# Patient Record
Sex: Male | Born: 1945 | Race: Black or African American | Hispanic: No | Smoking: Current some day smoker
Health system: Southern US, Community
[De-identification: ages and names within clinical notes are randomized; demographics above are authoritative.]

## PROBLEM LIST (undated history)

## (undated) DIAGNOSIS — M1990 Unspecified osteoarthritis, unspecified site: Secondary | ICD-10-CM

## (undated) DIAGNOSIS — K4090 Unilateral inguinal hernia, without obstruction or gangrene, not specified as recurrent: Secondary | ICD-10-CM

## (undated) DIAGNOSIS — N529 Male erectile dysfunction, unspecified: Secondary | ICD-10-CM

## (undated) DIAGNOSIS — I4729 Other ventricular tachycardia: Secondary | ICD-10-CM

## (undated) DIAGNOSIS — L988 Other specified disorders of the skin and subcutaneous tissue: Secondary | ICD-10-CM

## (undated) DIAGNOSIS — K631 Perforation of intestine (nontraumatic): Secondary | ICD-10-CM

## (undated) DIAGNOSIS — I10 Essential (primary) hypertension: Secondary | ICD-10-CM

## (undated) DIAGNOSIS — J449 Chronic obstructive pulmonary disease, unspecified: Secondary | ICD-10-CM

## (undated) DIAGNOSIS — K659 Peritonitis, unspecified: Secondary | ICD-10-CM

## (undated) DIAGNOSIS — K5792 Diverticulitis of intestine, part unspecified, without perforation or abscess without bleeding: Secondary | ICD-10-CM

## (undated) DIAGNOSIS — I472 Ventricular tachycardia: Secondary | ICD-10-CM

## (undated) DIAGNOSIS — N183 Chronic kidney disease, stage 3 unspecified: Secondary | ICD-10-CM

## (undated) DIAGNOSIS — I420 Dilated cardiomyopathy: Secondary | ICD-10-CM

## (undated) DIAGNOSIS — N5089 Other specified disorders of the male genital organs: Secondary | ICD-10-CM

## (undated) DIAGNOSIS — M109 Gout, unspecified: Secondary | ICD-10-CM

## (undated) DIAGNOSIS — K56609 Unspecified intestinal obstruction, unspecified as to partial versus complete obstruction: Secondary | ICD-10-CM

## (undated) DIAGNOSIS — I5022 Chronic systolic (congestive) heart failure: Secondary | ICD-10-CM

## (undated) HISTORY — DX: Chronic systolic (congestive) heart failure: I50.22

## (undated) HISTORY — DX: Unilateral inguinal hernia, without obstruction or gangrene, not specified as recurrent: K40.90

## (undated) HISTORY — DX: Other ventricular tachycardia: I47.29

## (undated) HISTORY — DX: Chronic kidney disease, stage 3 (moderate): N18.3

## (undated) HISTORY — DX: Male erectile dysfunction, unspecified: N52.9

## (undated) HISTORY — DX: Perforation of intestine (nontraumatic): K63.1

## (undated) HISTORY — DX: Dilated cardiomyopathy: I42.0

## (undated) HISTORY — DX: Chronic obstructive pulmonary disease, unspecified: J44.9

## (undated) HISTORY — DX: Diverticulitis of intestine, part unspecified, without perforation or abscess without bleeding: K57.92

## (undated) HISTORY — DX: Gout, unspecified: M10.9

## (undated) HISTORY — DX: Peritonitis, unspecified: K65.9

## (undated) HISTORY — DX: Chronic kidney disease, stage 3 unspecified: N18.30

## (undated) HISTORY — DX: Unspecified osteoarthritis, unspecified site: M19.90

## (undated) HISTORY — PX: COLON SURGERY: SHX602

## (undated) HISTORY — PX: COLOSTOMY: SHX63

## (undated) HISTORY — DX: Unspecified intestinal obstruction, unspecified as to partial versus complete obstruction: K56.609

## (undated) HISTORY — DX: Other specified disorders of the skin and subcutaneous tissue: L98.8

## (undated) HISTORY — DX: Ventricular tachycardia: I47.2

---

## 2003-01-29 ENCOUNTER — Ambulatory Visit (HOSPITAL_COMMUNITY): Admission: RE | Admit: 2003-01-29 | Discharge: 2003-01-29 | Payer: Self-pay | Admitting: *Deleted

## 2004-10-20 ENCOUNTER — Ambulatory Visit: Payer: Self-pay | Admitting: Physical Medicine & Rehabilitation

## 2004-10-20 ENCOUNTER — Ambulatory Visit: Payer: Self-pay | Admitting: Infectious Diseases

## 2004-10-20 ENCOUNTER — Inpatient Hospital Stay (HOSPITAL_COMMUNITY): Admission: EM | Admit: 2004-10-20 | Discharge: 2005-04-08 | Payer: Self-pay | Admitting: Emergency Medicine

## 2004-10-21 ENCOUNTER — Encounter (INDEPENDENT_AMBULATORY_CARE_PROVIDER_SITE_OTHER): Payer: Self-pay | Admitting: Cardiology

## 2004-10-21 ENCOUNTER — Encounter (INDEPENDENT_AMBULATORY_CARE_PROVIDER_SITE_OTHER): Payer: Self-pay | Admitting: Specialist

## 2004-10-30 ENCOUNTER — Ambulatory Visit: Payer: Self-pay | Admitting: Internal Medicine

## 2004-12-04 ENCOUNTER — Ambulatory Visit: Payer: Self-pay | Admitting: Internal Medicine

## 2004-12-27 ENCOUNTER — Ambulatory Visit: Payer: Self-pay | Admitting: Internal Medicine

## 2005-01-11 ENCOUNTER — Ambulatory Visit: Payer: Self-pay | Admitting: Internal Medicine

## 2005-01-29 ENCOUNTER — Ambulatory Visit: Payer: Self-pay | Admitting: Pulmonary Disease

## 2005-02-03 ENCOUNTER — Encounter (INDEPENDENT_AMBULATORY_CARE_PROVIDER_SITE_OTHER): Payer: Self-pay | Admitting: Specialist

## 2005-02-17 ENCOUNTER — Encounter: Payer: Self-pay | Admitting: Cardiology

## 2005-02-17 ENCOUNTER — Ambulatory Visit: Payer: Self-pay | Admitting: Cardiology

## 2005-02-21 ENCOUNTER — Encounter: Payer: Self-pay | Admitting: Cardiology

## 2005-03-08 ENCOUNTER — Ambulatory Visit: Payer: Self-pay | Admitting: Emergency Medicine

## 2005-03-09 ENCOUNTER — Encounter (INDEPENDENT_AMBULATORY_CARE_PROVIDER_SITE_OTHER): Payer: Self-pay | Admitting: Specialist

## 2005-03-29 ENCOUNTER — Ambulatory Visit: Payer: Self-pay | Admitting: Internal Medicine

## 2005-07-11 ENCOUNTER — Ambulatory Visit (HOSPITAL_COMMUNITY): Admission: RE | Admit: 2005-07-11 | Discharge: 2005-07-11 | Payer: Self-pay | Admitting: General Surgery

## 2006-07-14 IMAGING — CR DG CHEST 1V PORT
1 series · 1 of 1 positions shown · non-contrast
Comparison: none

CLINICAL DATA: Acute renal failure. CHF.

Portable chest at 8552:
Comparison to earlier film of same day. Moderate enlargement of the cardiac
silhouette. Low lung volumes. Possible mild pulmonary vascular congestion.
Patchy infiltrate or atelectasis   in the right lower lung and left infrahilar
regions. No definite effusion.

[view not recorded]
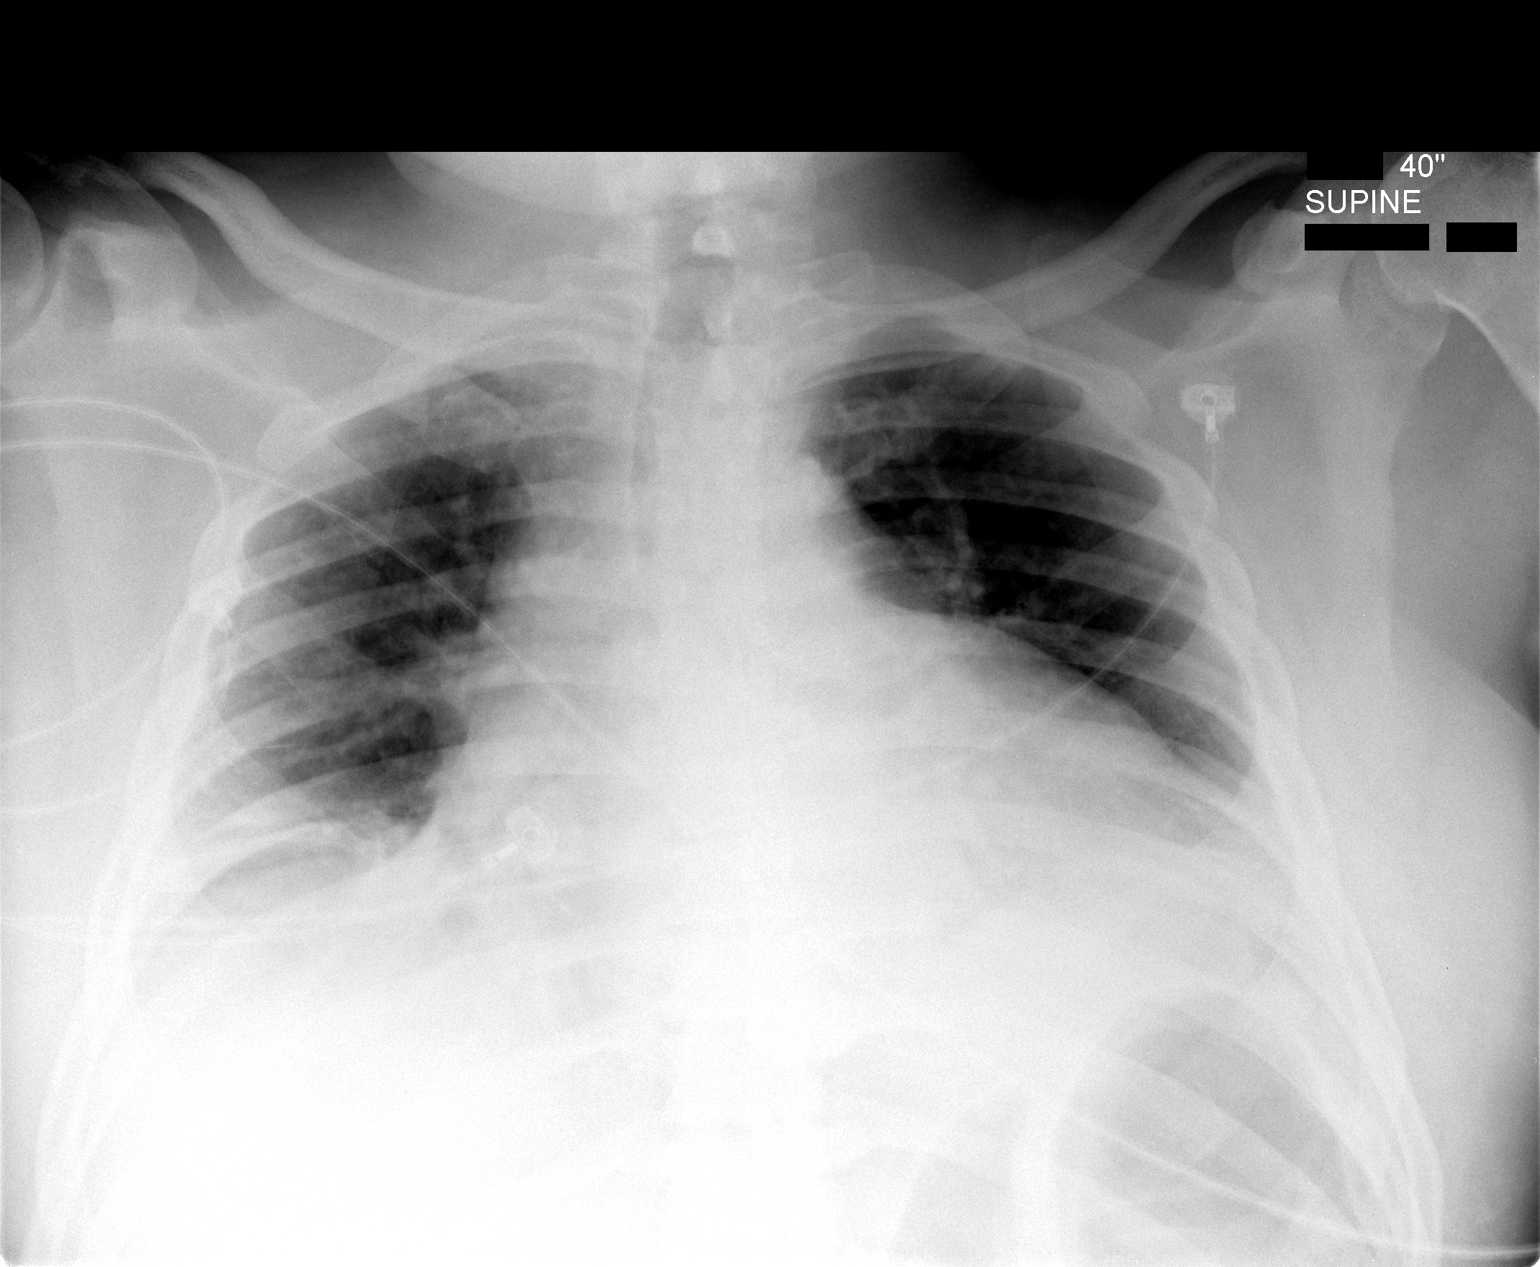

[1 of 1 positions shown; findings below may reference images not displayed]

IMPRESSION: 1. Moderate cardiomegaly.
2. Patchy right lower lung and left infrahilar infiltrates or atelectasis, with
low lung volumes

## 2006-07-14 IMAGING — US US RENAL PORT
1 series · 14 of 25 positions shown · non-contrast
Comparison: none

CLINICAL DATA: Acute renal failure. Hypertension.

Renal ultrasound:
Right kidney measures at least 10.5 cm in length, left 12 cm. A 14 x 17 mm cyst
is noted from the upper pole left kidney. No solid renal mass or hydronephrosis.
Urinary bladder decompressed by Foley catheter.

[Series 1: unknown · 0.35mm/px · 14 of 31 slices shown]
[im 1/31]
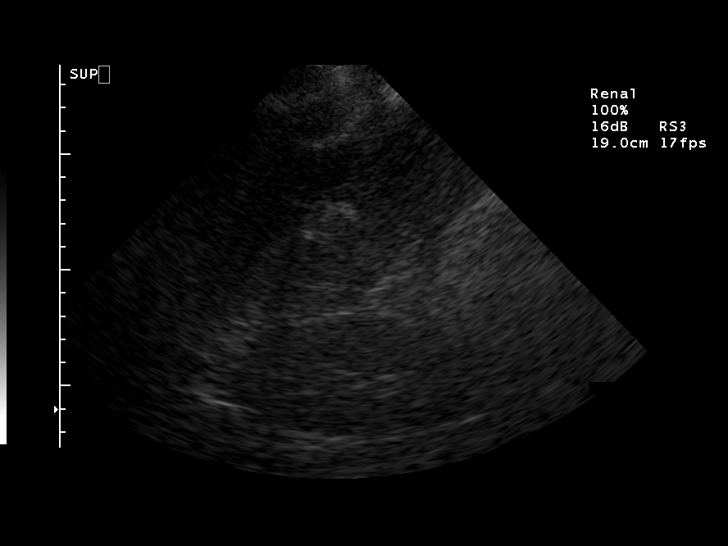
[im 3/31]
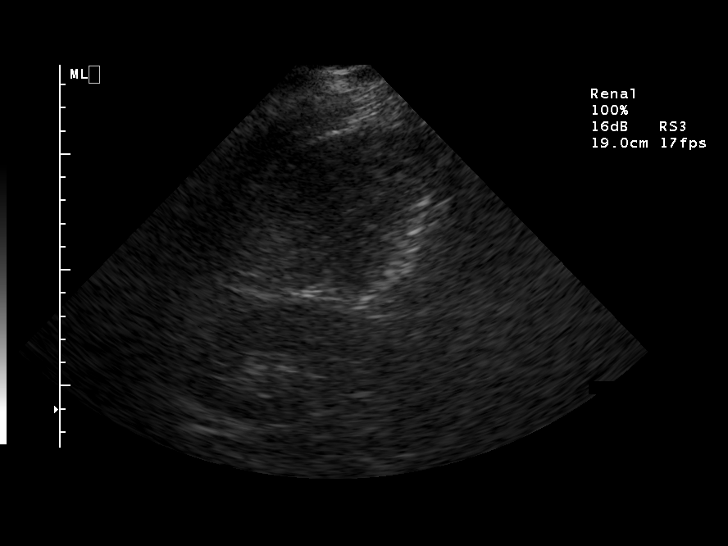
[im 6/31]
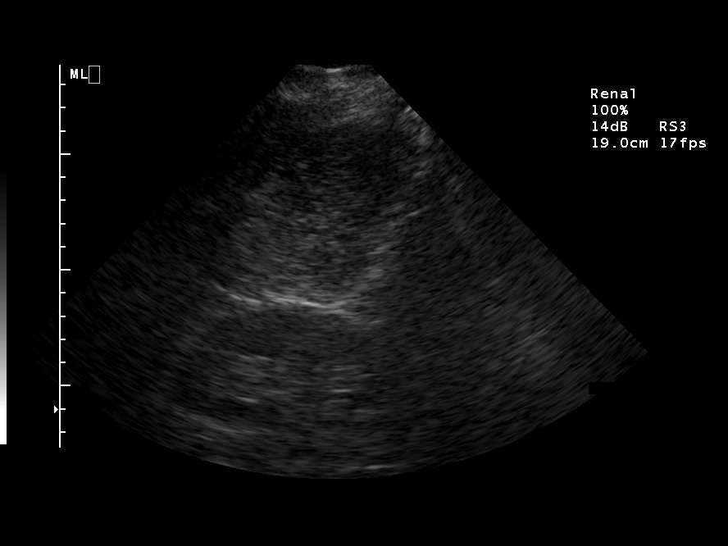
[im 8/31]
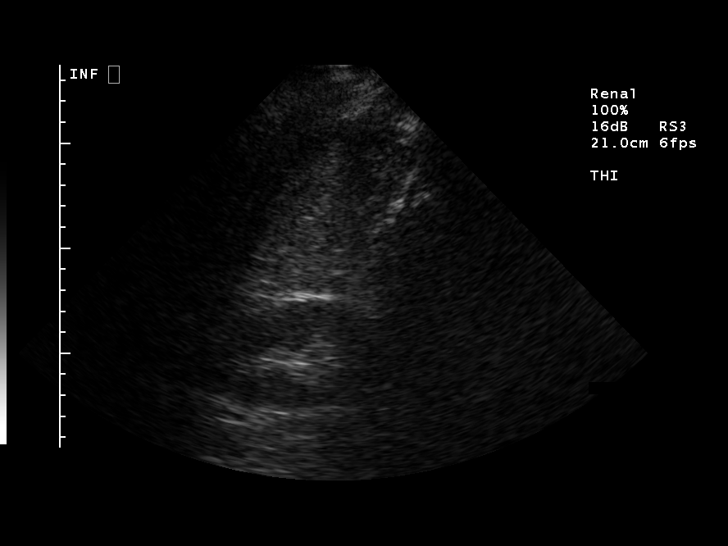
[im 11/31]
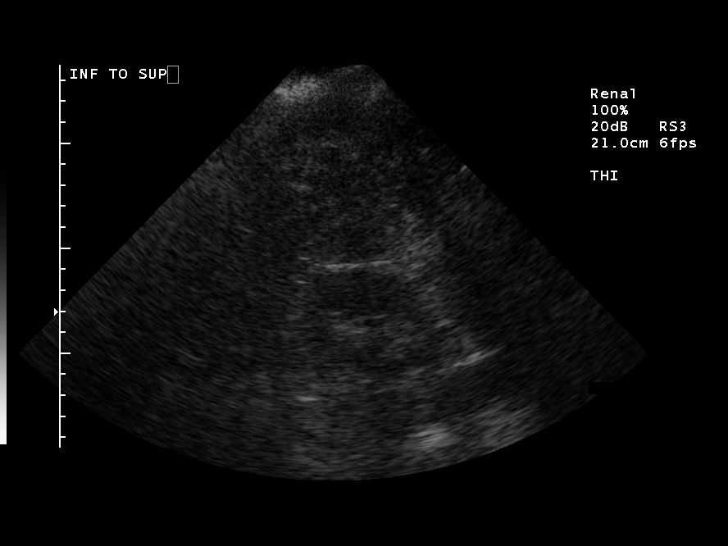
[im 12/31]
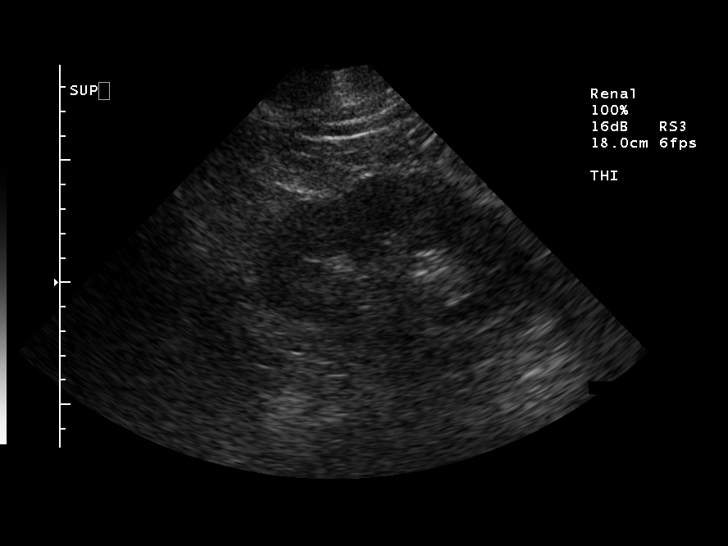
[im 14/31]
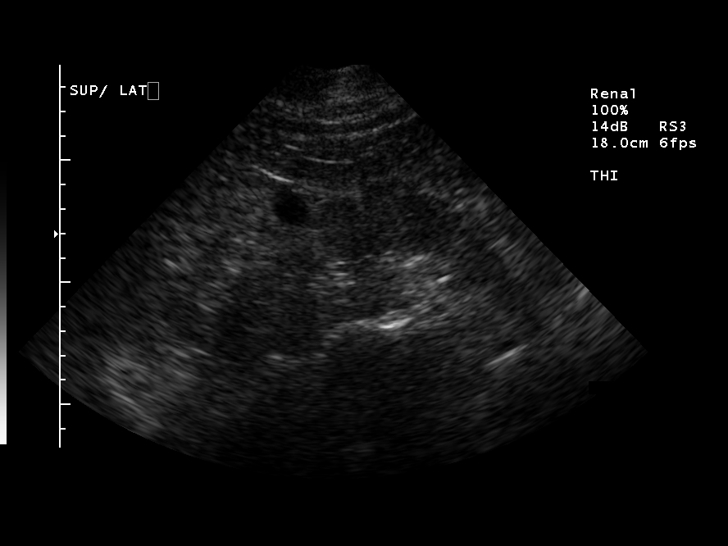
[im 17/31]
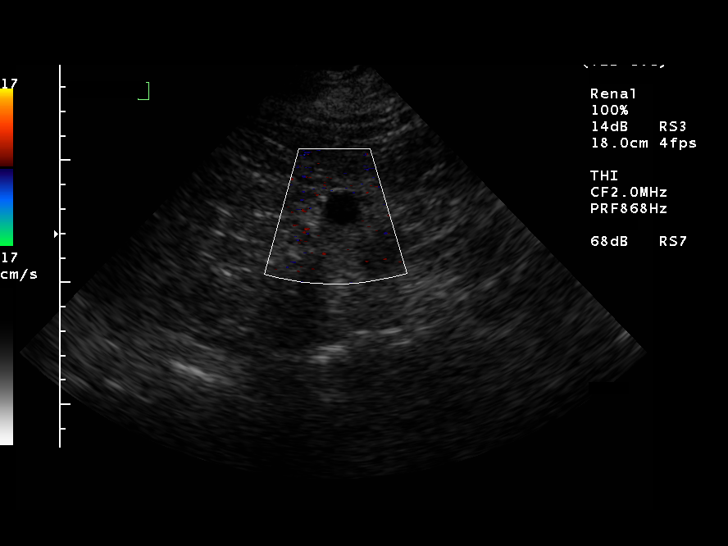
[im 19/31]
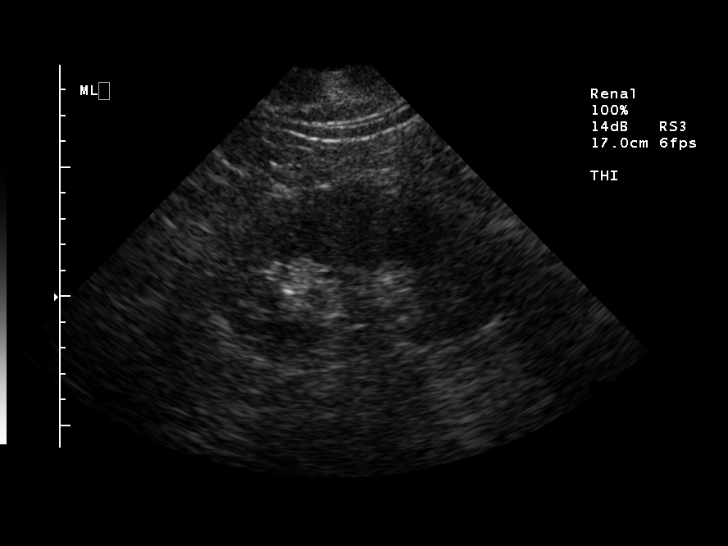
[im 21/31]
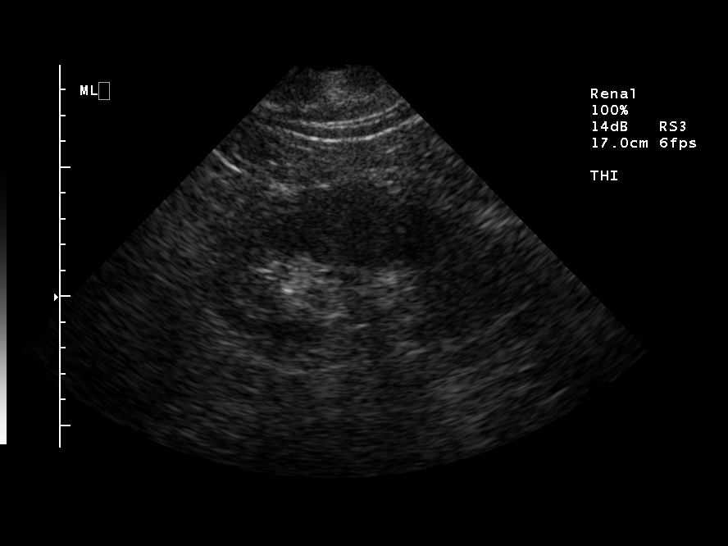
[im 23/31]
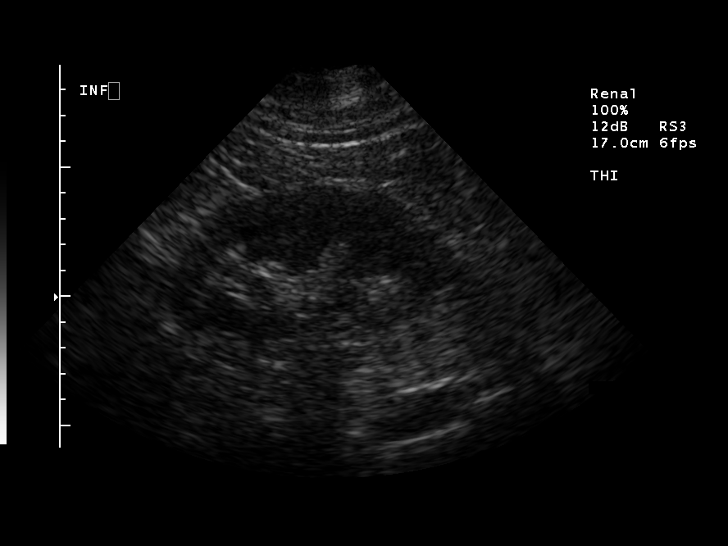
[im 26/31]
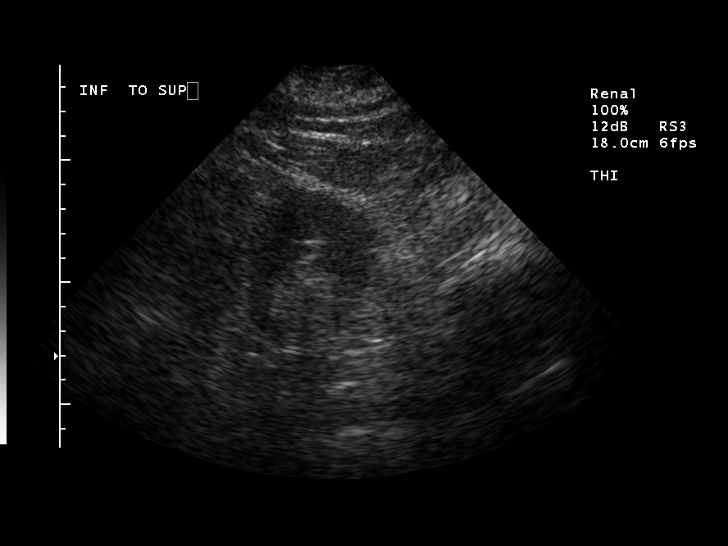
[im 28/31]
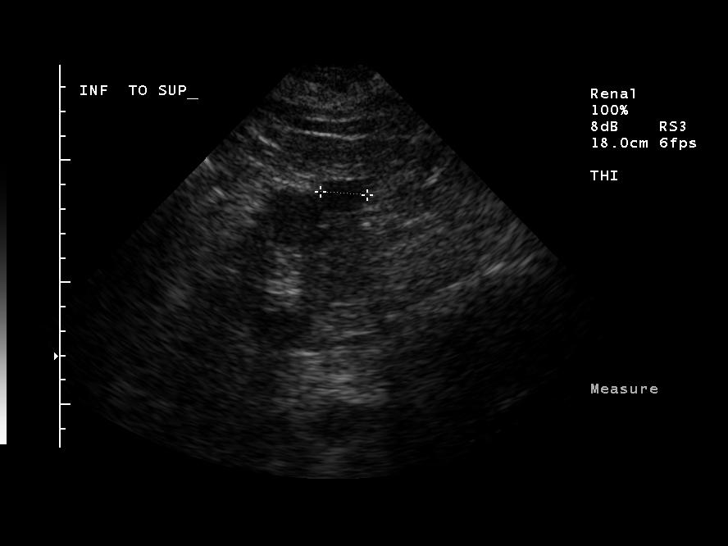
[im 31/31]
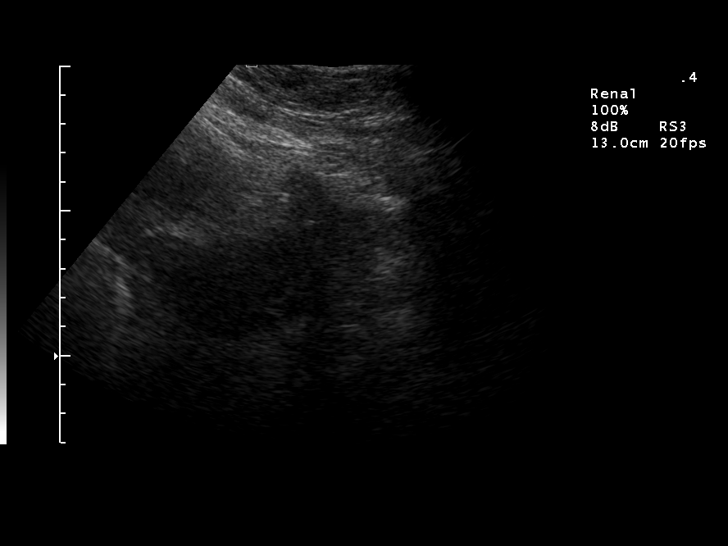

[14 of 25 positions shown; findings below may reference images not displayed]

IMPRESSION: 1. Negative for hydronephrosis.
2. Left upper pole renal cyst

## 2006-07-14 IMAGING — CR DG CHEST 1V PORT
1 series · 1 of 1 positions shown · non-contrast
Comparison: none

CLINICAL DATA: Chest pain, shortness of breath and weakness.
 PORTABLE CHEST (SINGLE VIEW) ? 10/20/04 (2424 HOURS):
 No prior studies. 
 The film was slightly blurred.  Lung volumes are extremely low and there is evidence of significant cardiomegaly.  No overt edema.

[view not recorded]
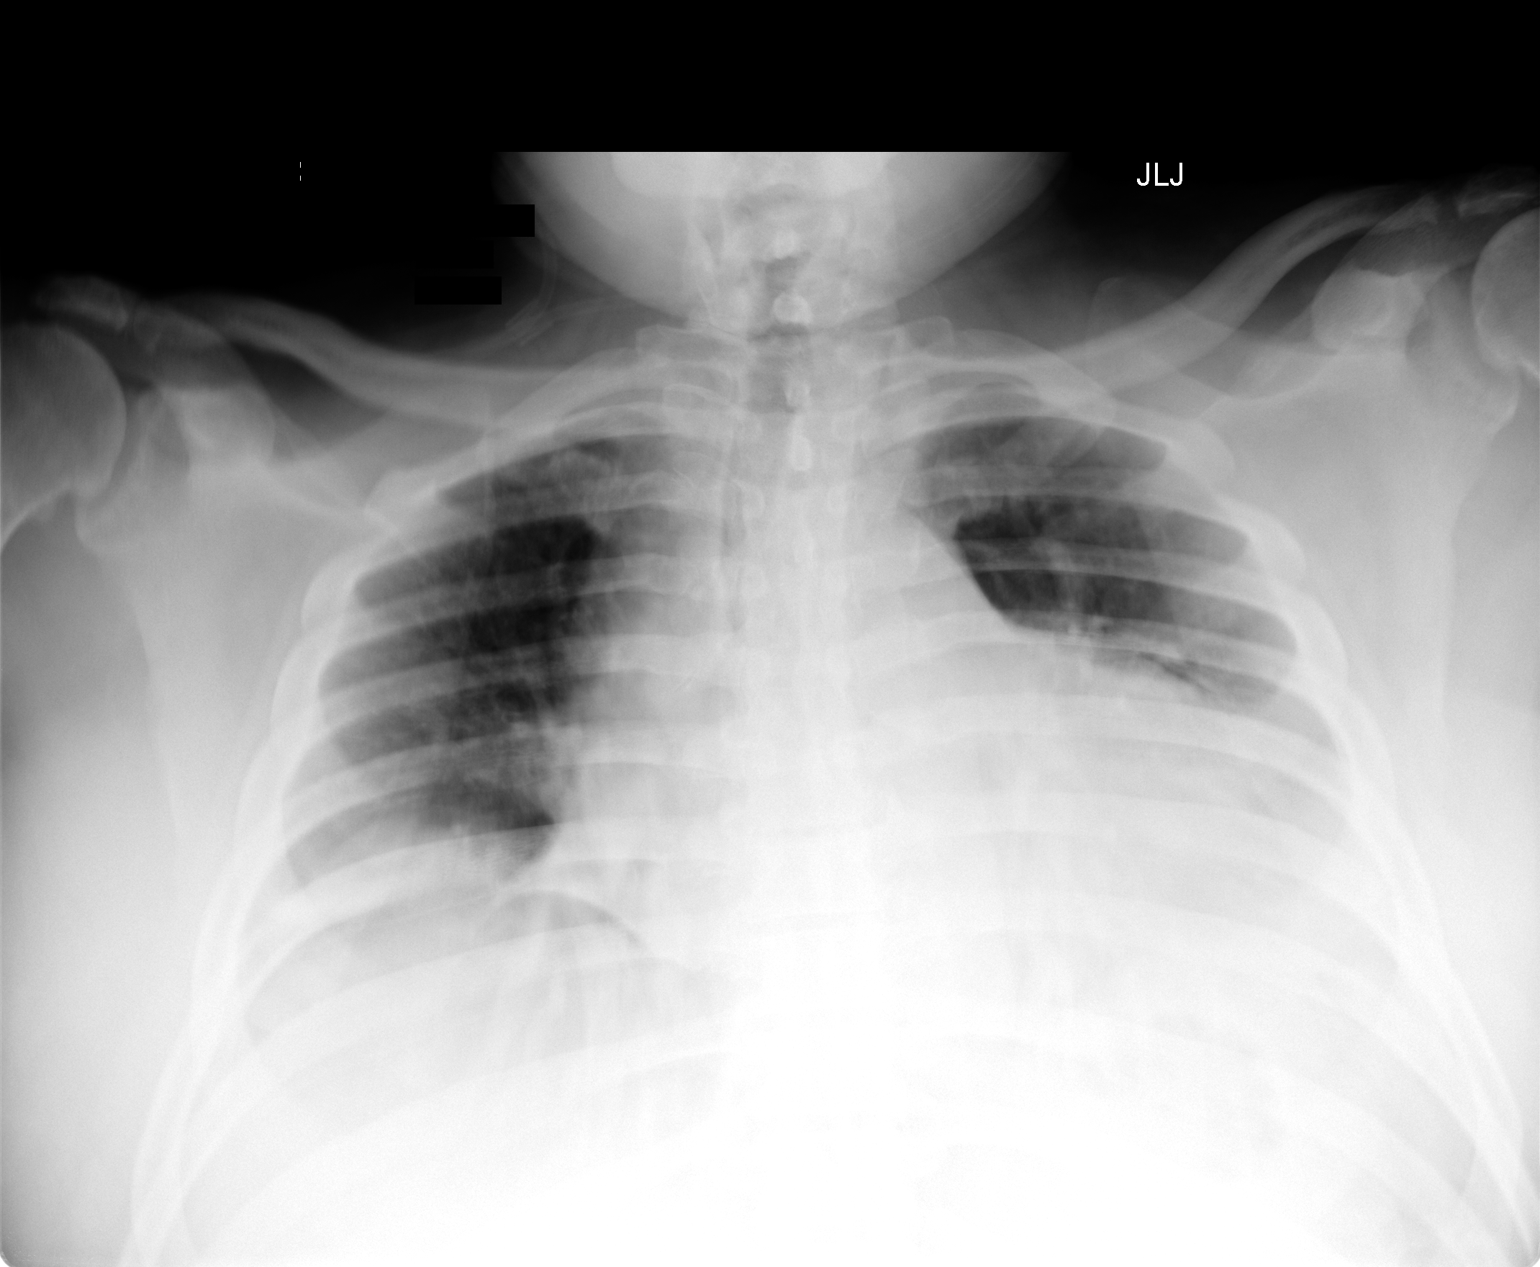

[1 of 1 positions shown; findings below may reference images not displayed]

IMPRESSION: Low lung volumes and significant underlying cardiomegaly.

## 2006-07-15 IMAGING — CT CT ABDOMEN W/O CM
1 of 3 series · 14 of 32 positions shown, 19 images · non-contrast
Comparison: Renal ultrasound dated 10/20/2004.
COMPARISON: None.

CLINICAL DATA: Abdominal pain and diarrhea. Acute renal failure.

ABDOMEN CT WITHOUT CONTRAST
TECHNIQUE: Multidetector CT imaging of the abdomen was performed following the
standard protocol without IV contrast.  Oral contrast was administered.
TECHNIQUE: Multidetector CT imaging of the pelvis was performed following the

[Series 2: abd_pel 5.0 b30f st · axial · 0.85mm/px · z∈[-572,-32]mm · 14 of 120 slices shown, 19 images]
[im 6/120  soft-tissue]
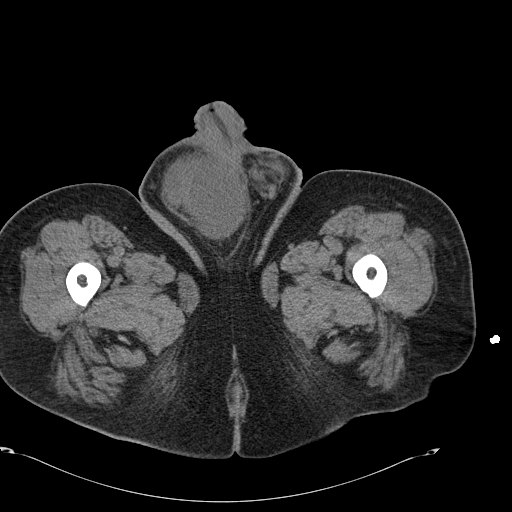
[im 6/120  bone]
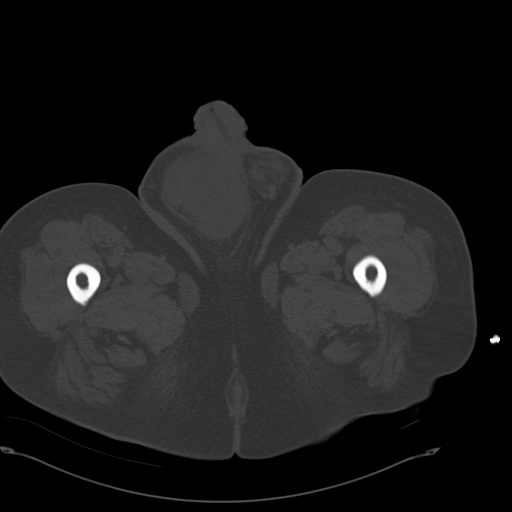
[im 18/120  soft-tissue]
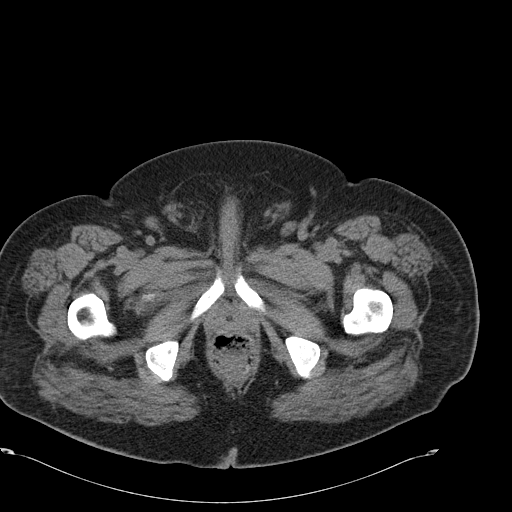
[im 23/120  soft-tissue]
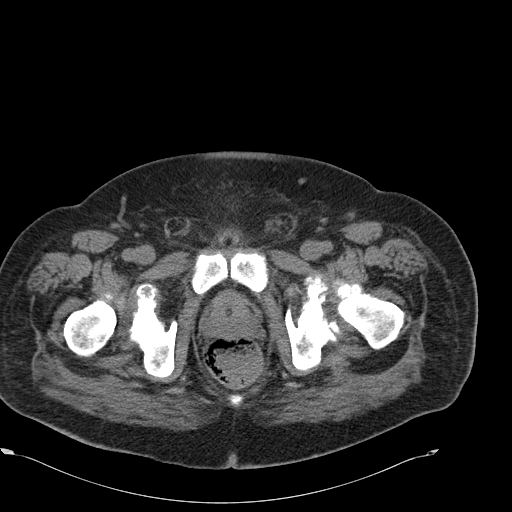
[im 35/120  soft-tissue]
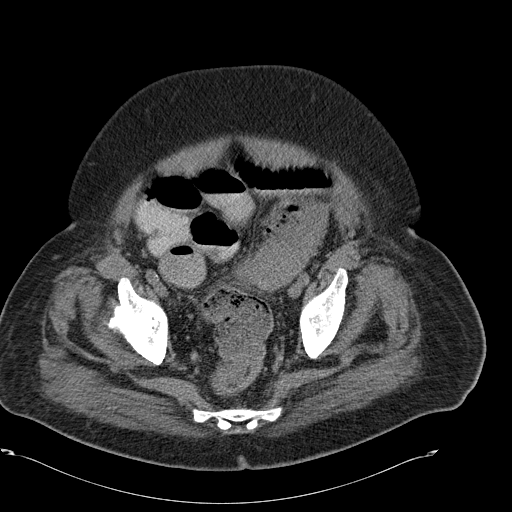
[im 40/120  soft-tissue]
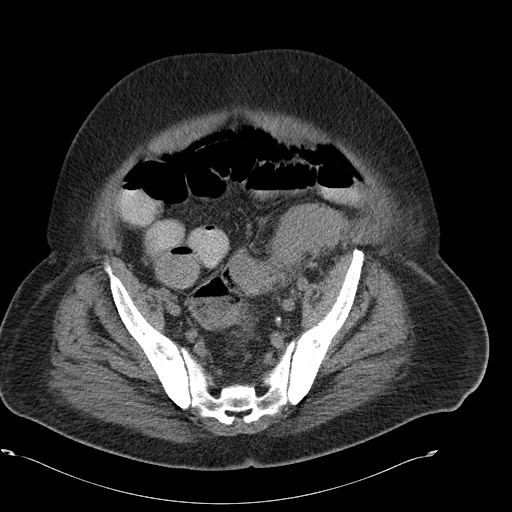
[im 52/120  soft-tissue]
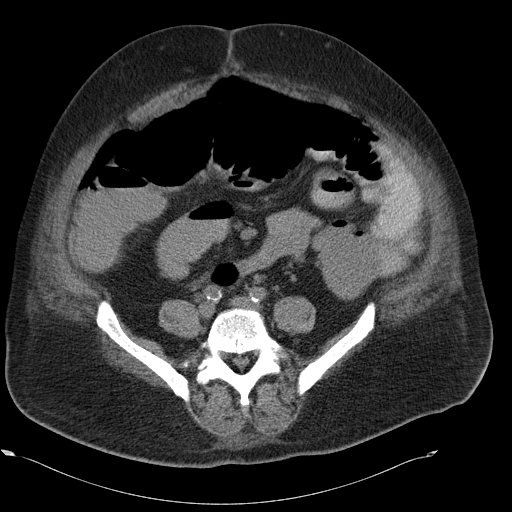
[im 63/120  soft-tissue]
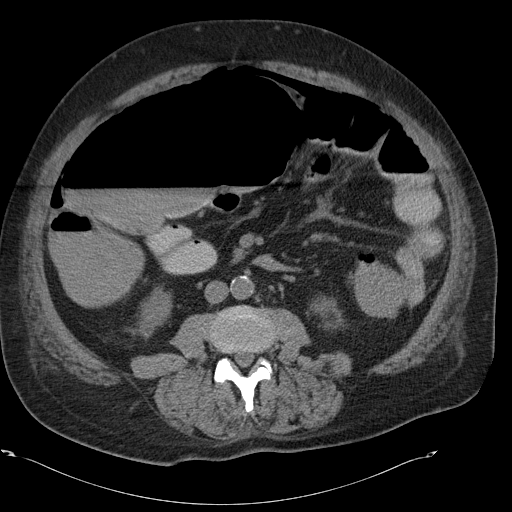
[im 69/120  soft-tissue]
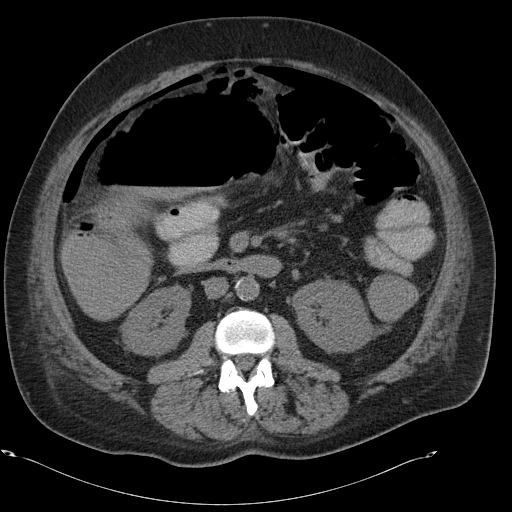
[im 80/120  soft-tissue]
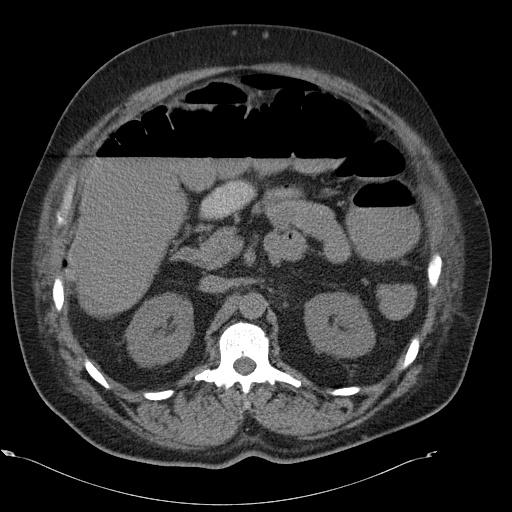
[im 80/120  bone]
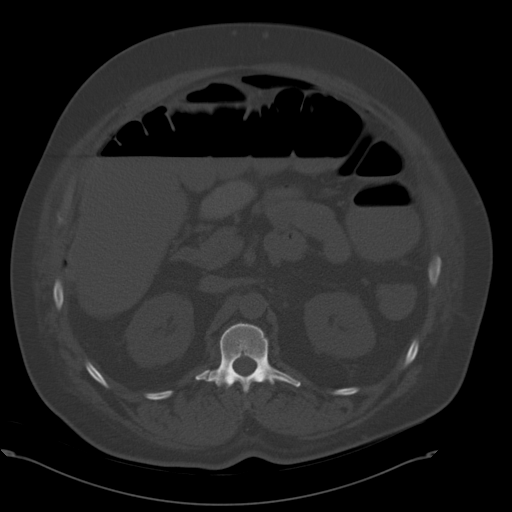
[im 86/120  soft-tissue]
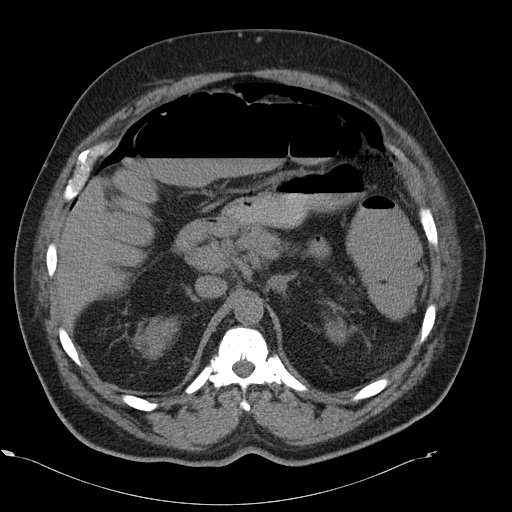
[im 97/120  soft-tissue]
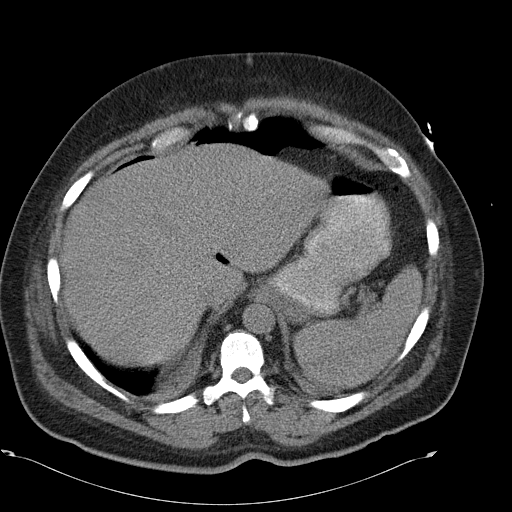
[im 97/120  lung]
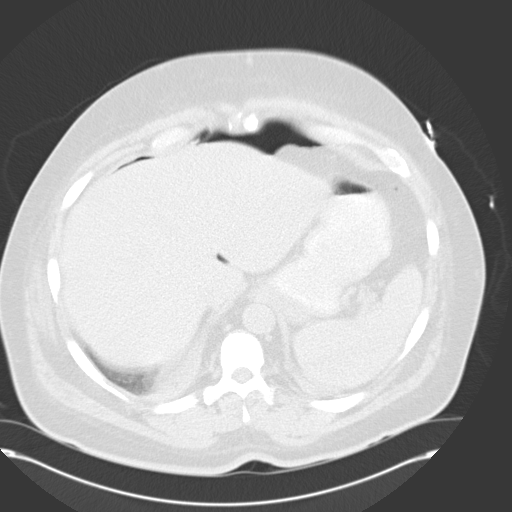
[im 103/120  soft-tissue]
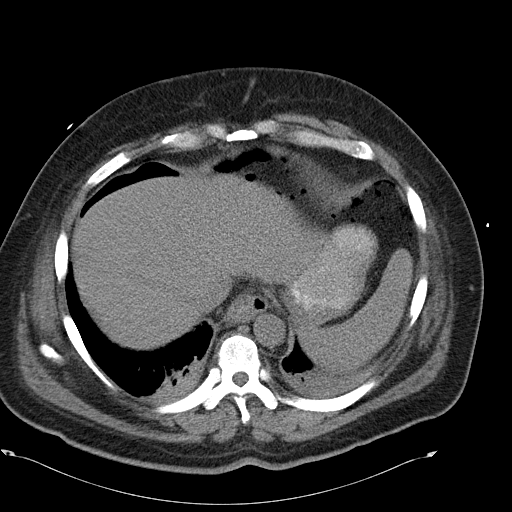
[im 103/120  lung]
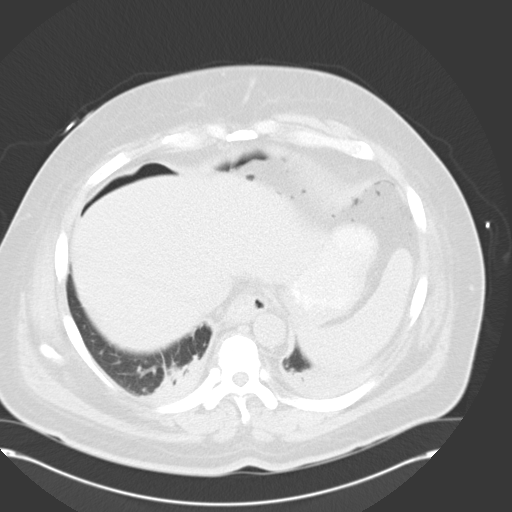
[im 108/120  lung]
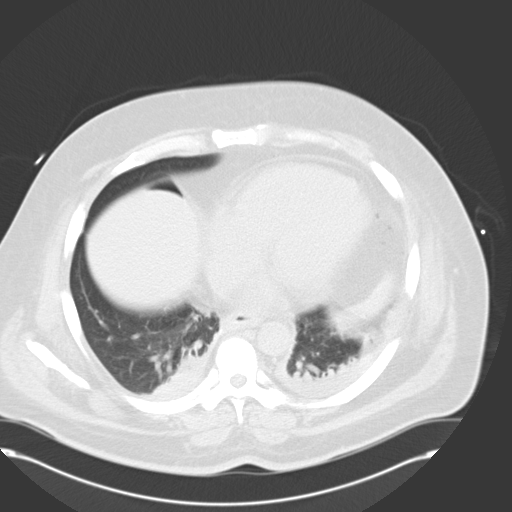
[im 114/120  soft-tissue]
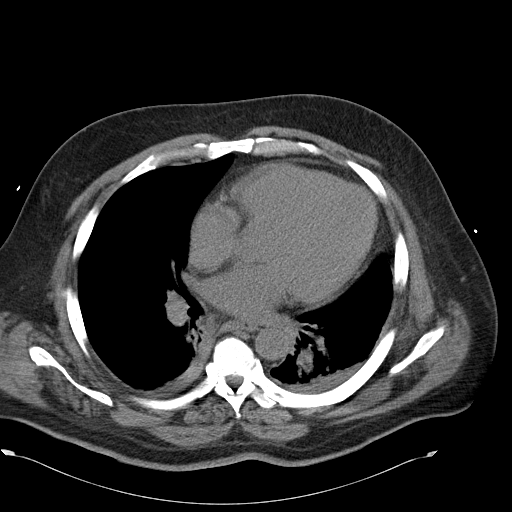
[im 114/120  lung]
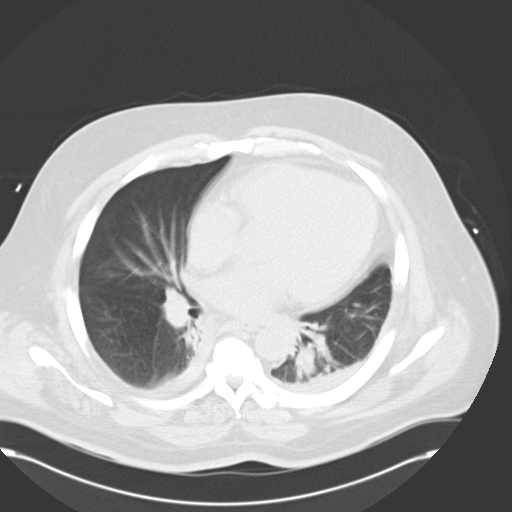

[14 of 32 positions shown; findings below may reference images not displayed]

FINDINGS: Moderate amount of free peritoneal air. Dilated colon and small bowel
with fluid throughout the colon. Bilateral lower lobe atelectasis. Multiple
descending colon diverticula. No bowel wall thickening or pericolonic edema.
Thickened left adrenal gland with appearance suggesting a 1.8 cm mass. Collapsed
gallbladder. Normal appearing right adrenal gland, spleen, pancreas and right
kidney. Previously noted left renal cyst. No enlarged lymph nodes. Unremarkable
bones.

IMPRESSION

1. Moderate amount of free peritoneal air, compatible with bowel perforation.
This has been discussed with Dr. Mimin and Dr. Joujma.

2. Diffuse small bowel and colon dilatation.

3. Possible 1.8 cm left adrenal adenoma.

4. Mild to moderate bilateral lower lobe atelectasis.

5. Multiple descending colon diverticula.

PELVIS CT WITHOUT CONTRAST
FINDINGS: Moderate amount of free peritoneal air. Dilated colon and small bowel
and fluid throughout the colon. Sigmoid colon diverticula without colon wall
thickening or pericolonic edema. Foley catheter in the urinary bladder. No free
peritoneal fluid or enlarged lymph nodes. Bilateral hip degenerative changes.
Large right scrotal hydrocele. This measures 10.2 x 8.9 cm in maximum
dimensions.

IMPRESSION

1. Moderate amount of free peritoneal air, compatible with bowel perforation.
This has been discussed with Dr. Mimin and Dr. Joujma.

2. Diffusely dilated small bowel and colon.

3. Multiple sigmoid colon diverticula.

4. Large right scrotal hydrocele.

## 2006-07-15 IMAGING — CR DG CHEST 1V PORT
1 series · 1 of 1 positions shown · non-contrast
Comparison: 10/20/04.

CLINICAL DATA: Acute renal failure.  CHF. 
 CHEST PORTABLE ? 1 VIEW:

[view not recorded]
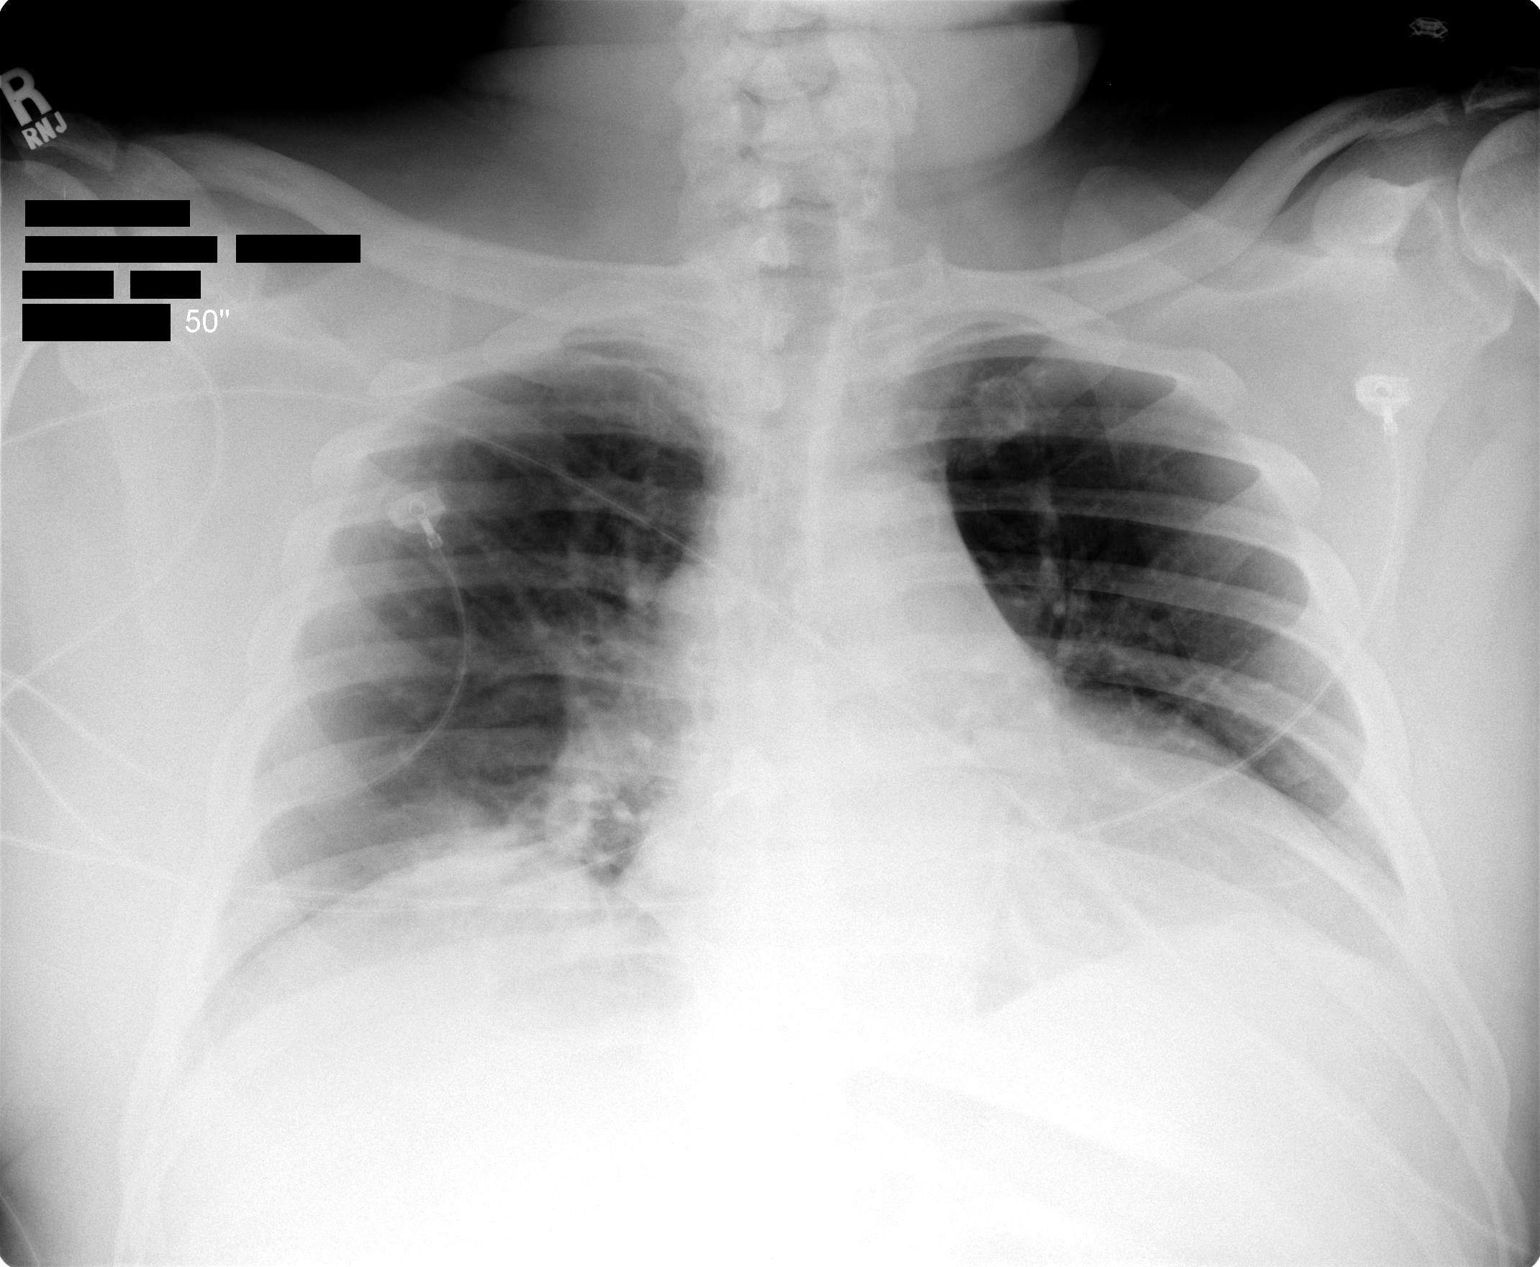

[1 of 1 positions shown; findings below may reference images not displayed]

AP chest at 9109 hours shows low volumes with cardiomegaly and vascular congestion.  Lung bases are slightly better aerated than on the previous study.  Telemetry leads overlie the chest.
IMPRESSION: 1.  Decreasing basilar atelectasis. 
 2.  Cardiomegaly.
 3.  Low lung volumes.

## 2006-07-15 IMAGING — CR DG CHEST 1V PORT
1 series · 1 of 1 positions shown · non-contrast
Comparison: Earlier same day.

CLINICAL DATA: Acute renal failure.  Congestive heart failure.  
 PORTABLE CHEST (6550 HOURS):

[view not recorded]
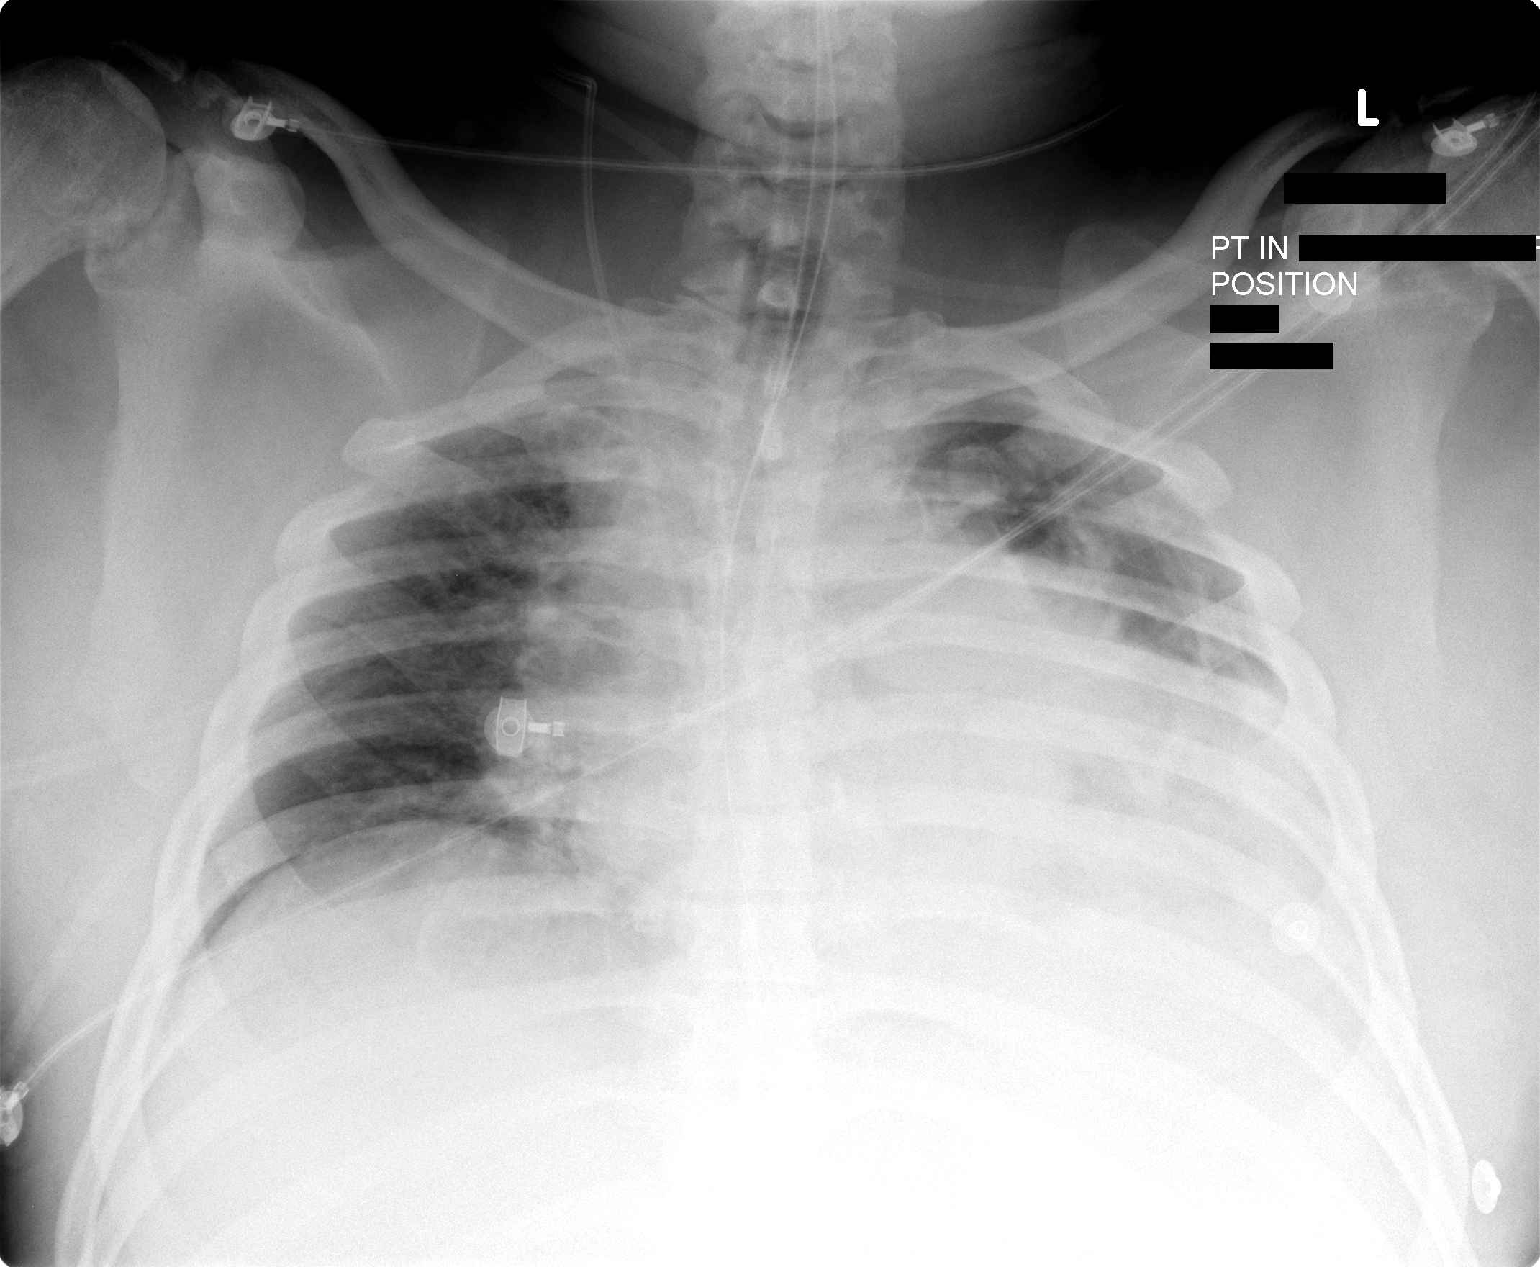

[1 of 1 positions shown; findings below may reference images not displayed]

Endotracheal tube has its tip 6 cm above the carina.  Nasogastric tube enters the abdomen.  Right internal jugular central line has its tip at the SVC-RA junction.    There is pulmonary edema and there is abnormal density in the left lung that appears to be due to atelectasis and possibly infiltrate.  Could the patient have aspirated?
IMPRESSION: See report.

## 2006-07-16 IMAGING — CR DG CHEST 1V PORT
1 series · 1 of 1 positions shown · non-contrast
Comparison: 10/21/04.

CLINICAL DATA: Respiratory failure, CHF and renal failure. 
 PORTABLE SINGLE VIEW CHEST, 10/22/04, [DATE] HOURS:

[view not recorded]
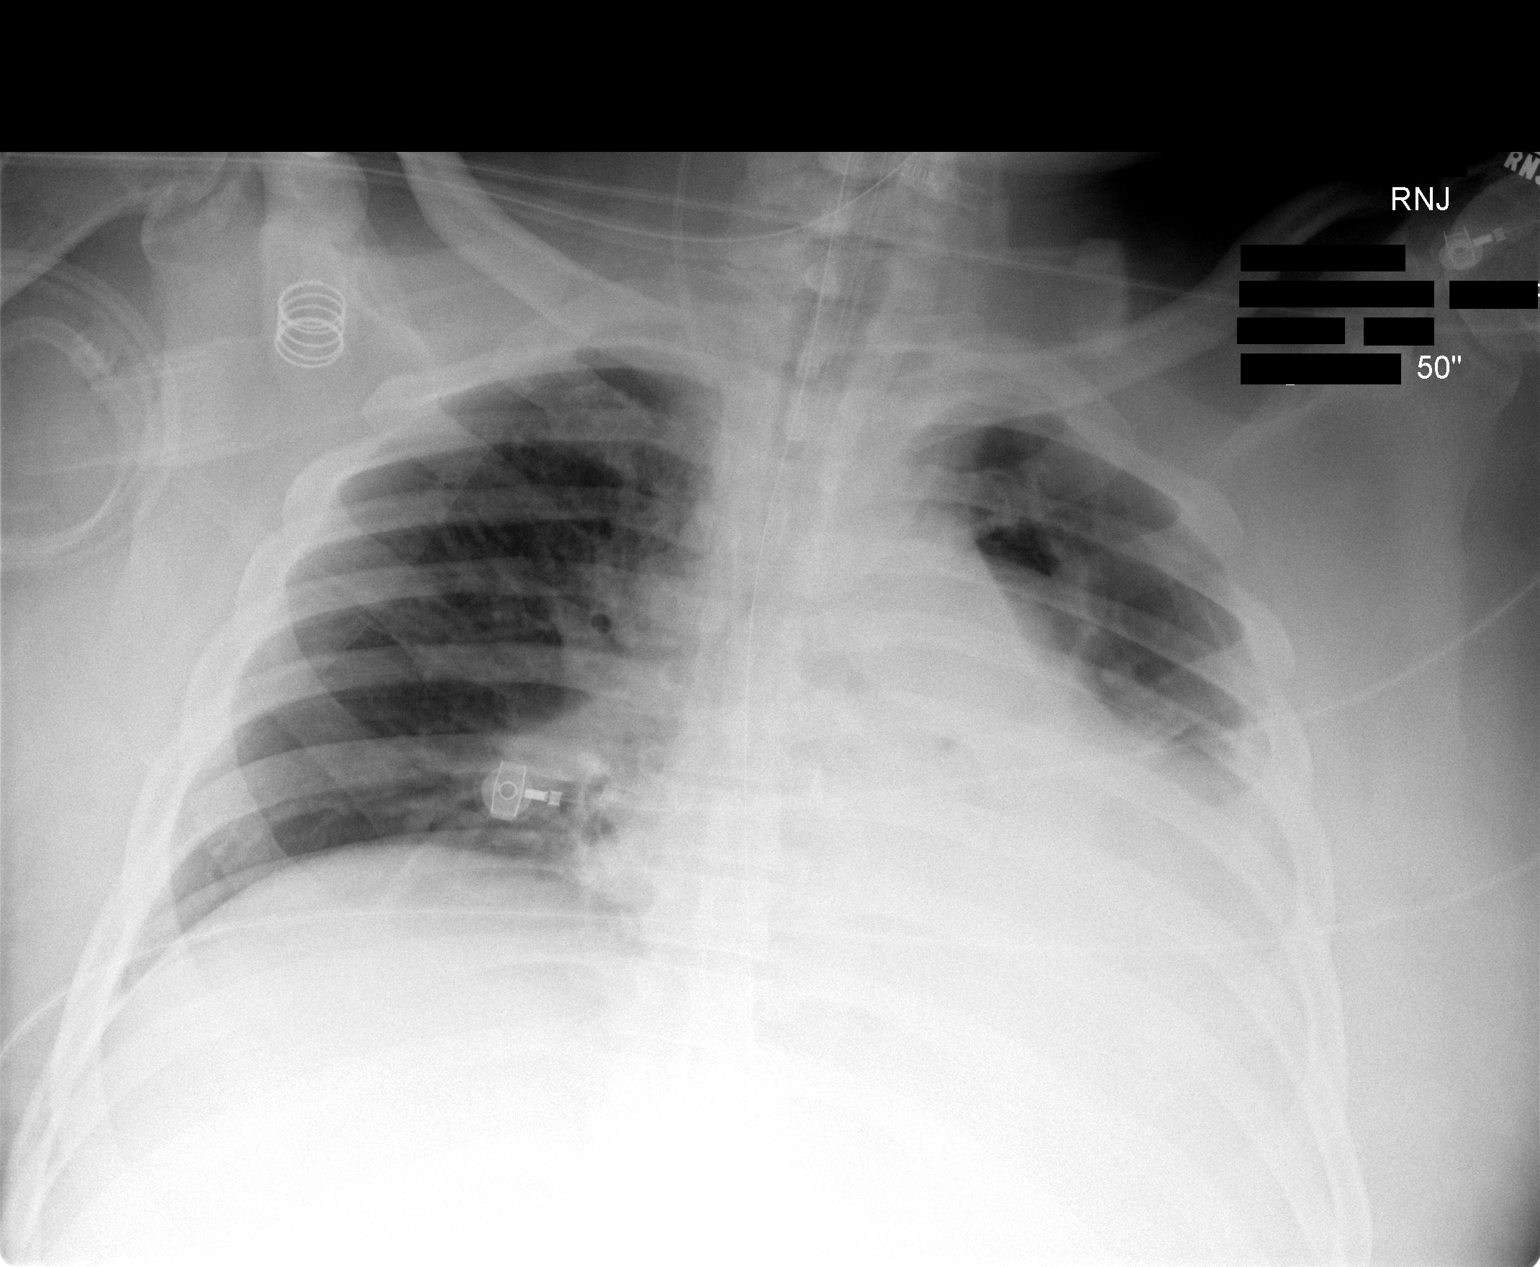

[1 of 1 positions shown; findings below may reference images not displayed]

Stable positioning of endotracheal tube.  There is slight interval worsening of dense consolidation in the left lower lobe.  There is probable associated left pleural fluid.  Very low bilateral lung volumes.  No overt edema.
IMPRESSION: Worsening of left lower lobe consolidation.  Probable component of associated left pleural fluid.

## 2006-07-17 IMAGING — CR DG CHEST 1V PORT
1 series · 1 of 1 positions shown · non-contrast
Comparison: 10/22/04.

CLINICAL DATA: Acute renal failure.  CHF.  
 PORTABLE CHEST - 1 VIEW:

[view not recorded]
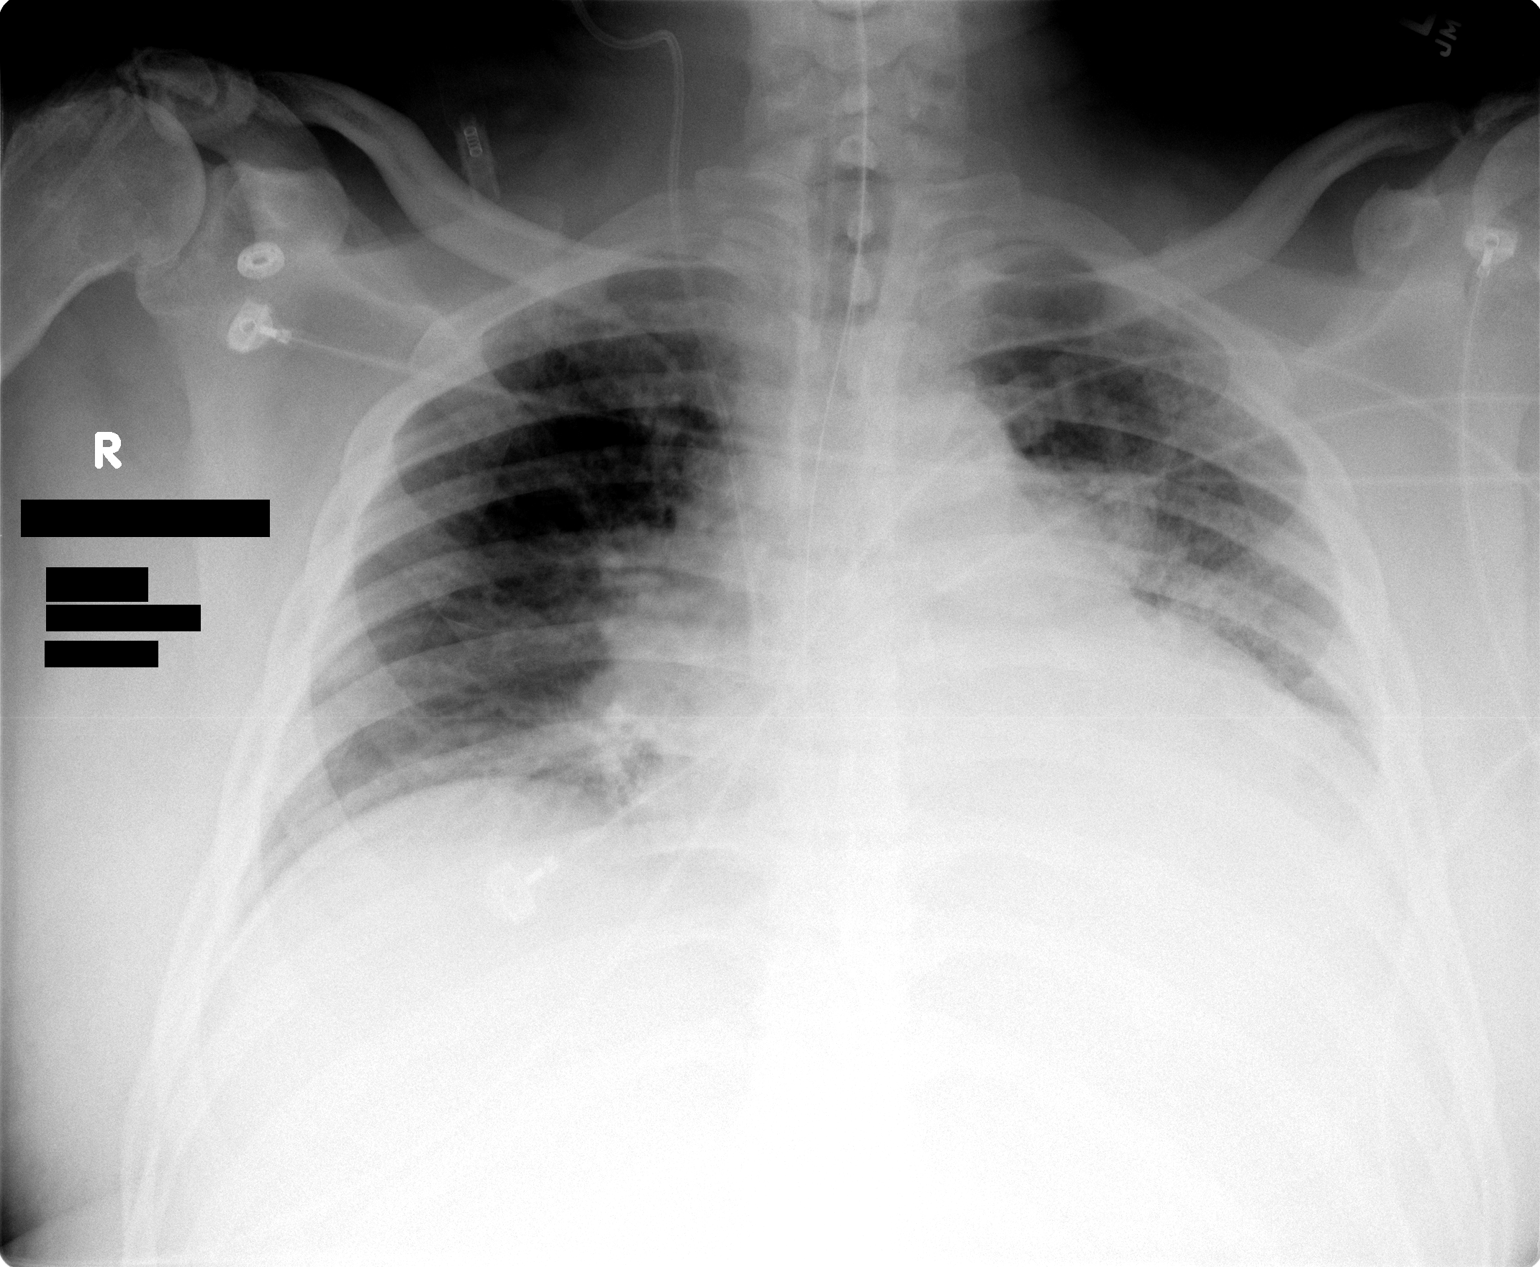

[1 of 1 positions shown; findings below may reference images not displayed]

FINDINGS: Increase in interstitial edema noted.  Continued left lower lung consolidation/atelectasis, probable small left effusion and right basilar atelectasis noted.  NG tube, endotracheal tube, right central venous catheter are unchanged.  Degenerative changes in the shoulders noted.
IMPRESSION: 1.  Increasing interstitial edema.  
 2.  Stable left lower lung consolidation/atelectasis.  Slight increase right basilar atelectasis.

## 2006-07-18 IMAGING — CR DG CHEST 1V PORT
1 series · 1 of 1 positions shown · non-contrast
Comparison: 10/23/04.

CLINICAL DATA: Acute renal failure.  CHF.  
 PORTABLE CHEST - 1 VIEW:

[view not recorded]
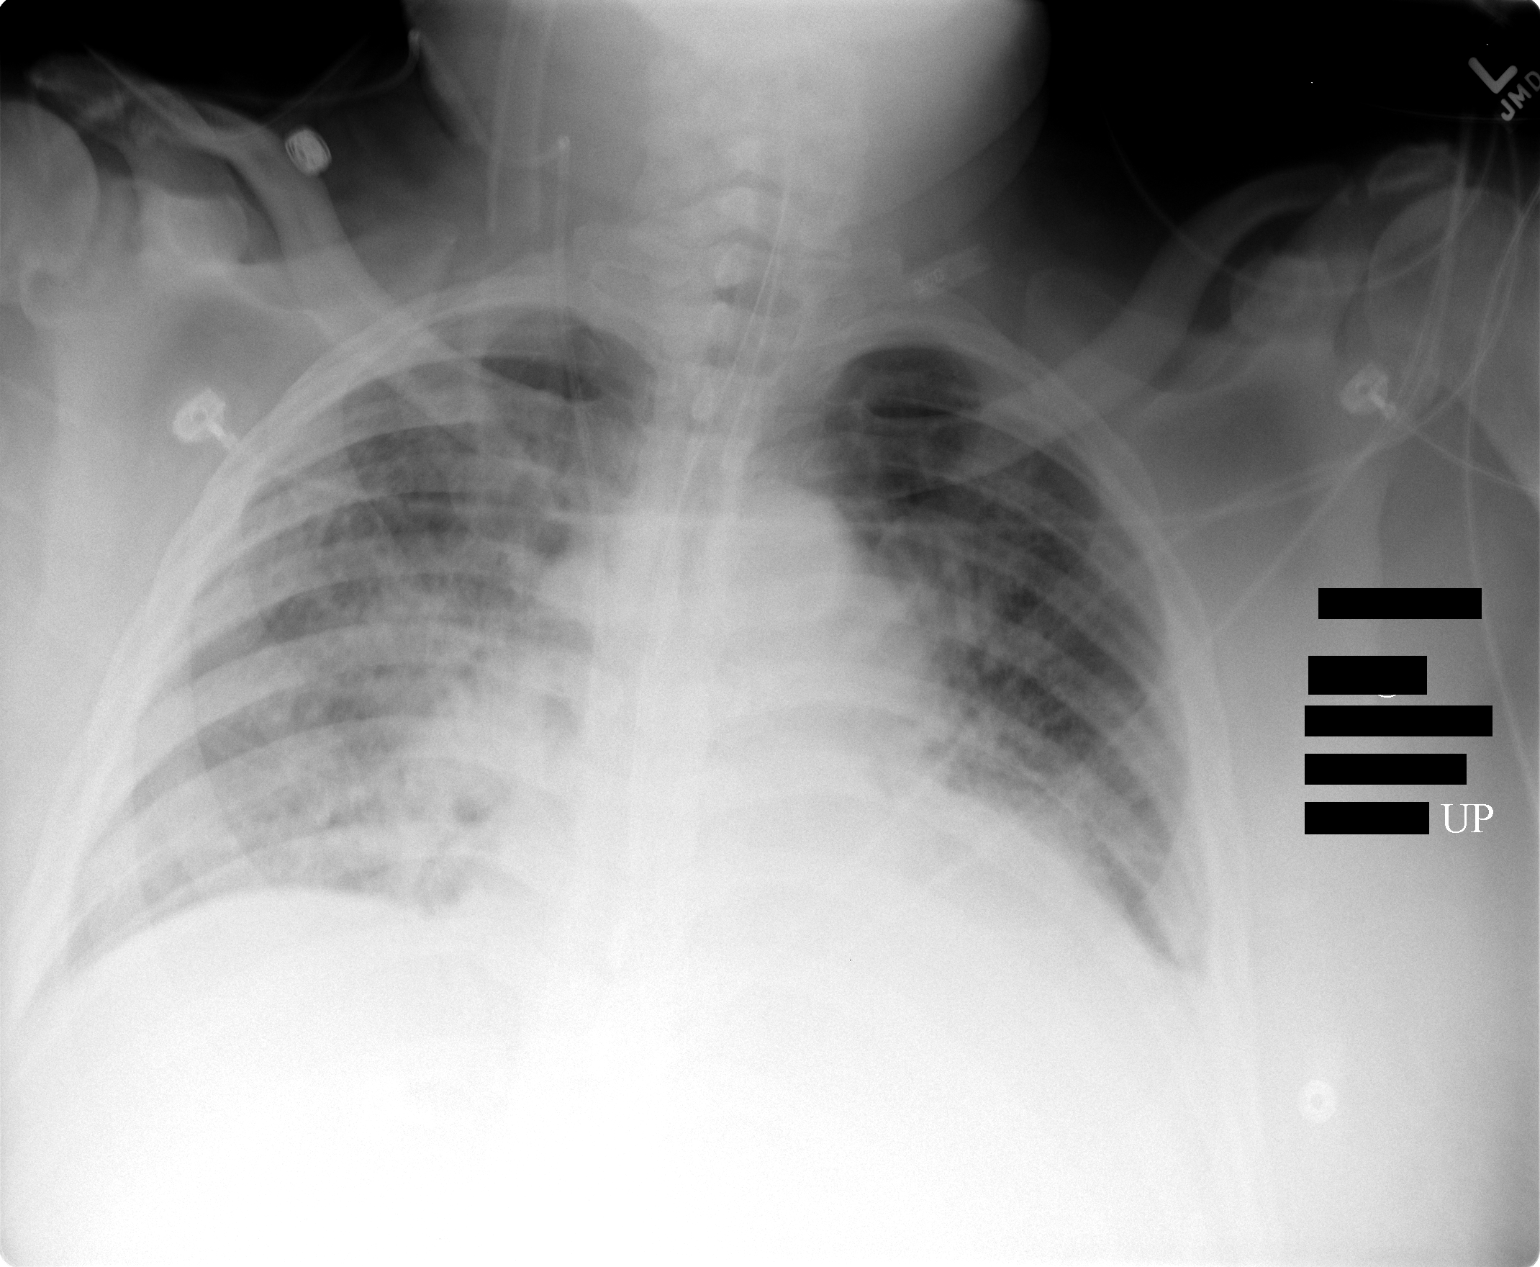

[1 of 1 positions shown; findings below may reference images not displayed]

FINDINGS: Increasing airspace disease throughout the right lung is noted probably representing edema but pneumonia not excluded.  NG tube, endotracheal tube, right central venous catheter are unchanged.  Continued left lower lung consolidation noted.
IMPRESSION: Increasing right lung airspace disease/edema.  Superimposed infection or aspiration not excluded.

## 2006-07-19 IMAGING — CR DG CHEST 1V PORT
1 series · 1 of 1 positions shown · non-contrast
Comparison: 10/24/04.

CLINICAL DATA: CHF, acute renal failure. 
 PORTABLE CHEST:

[view not recorded]
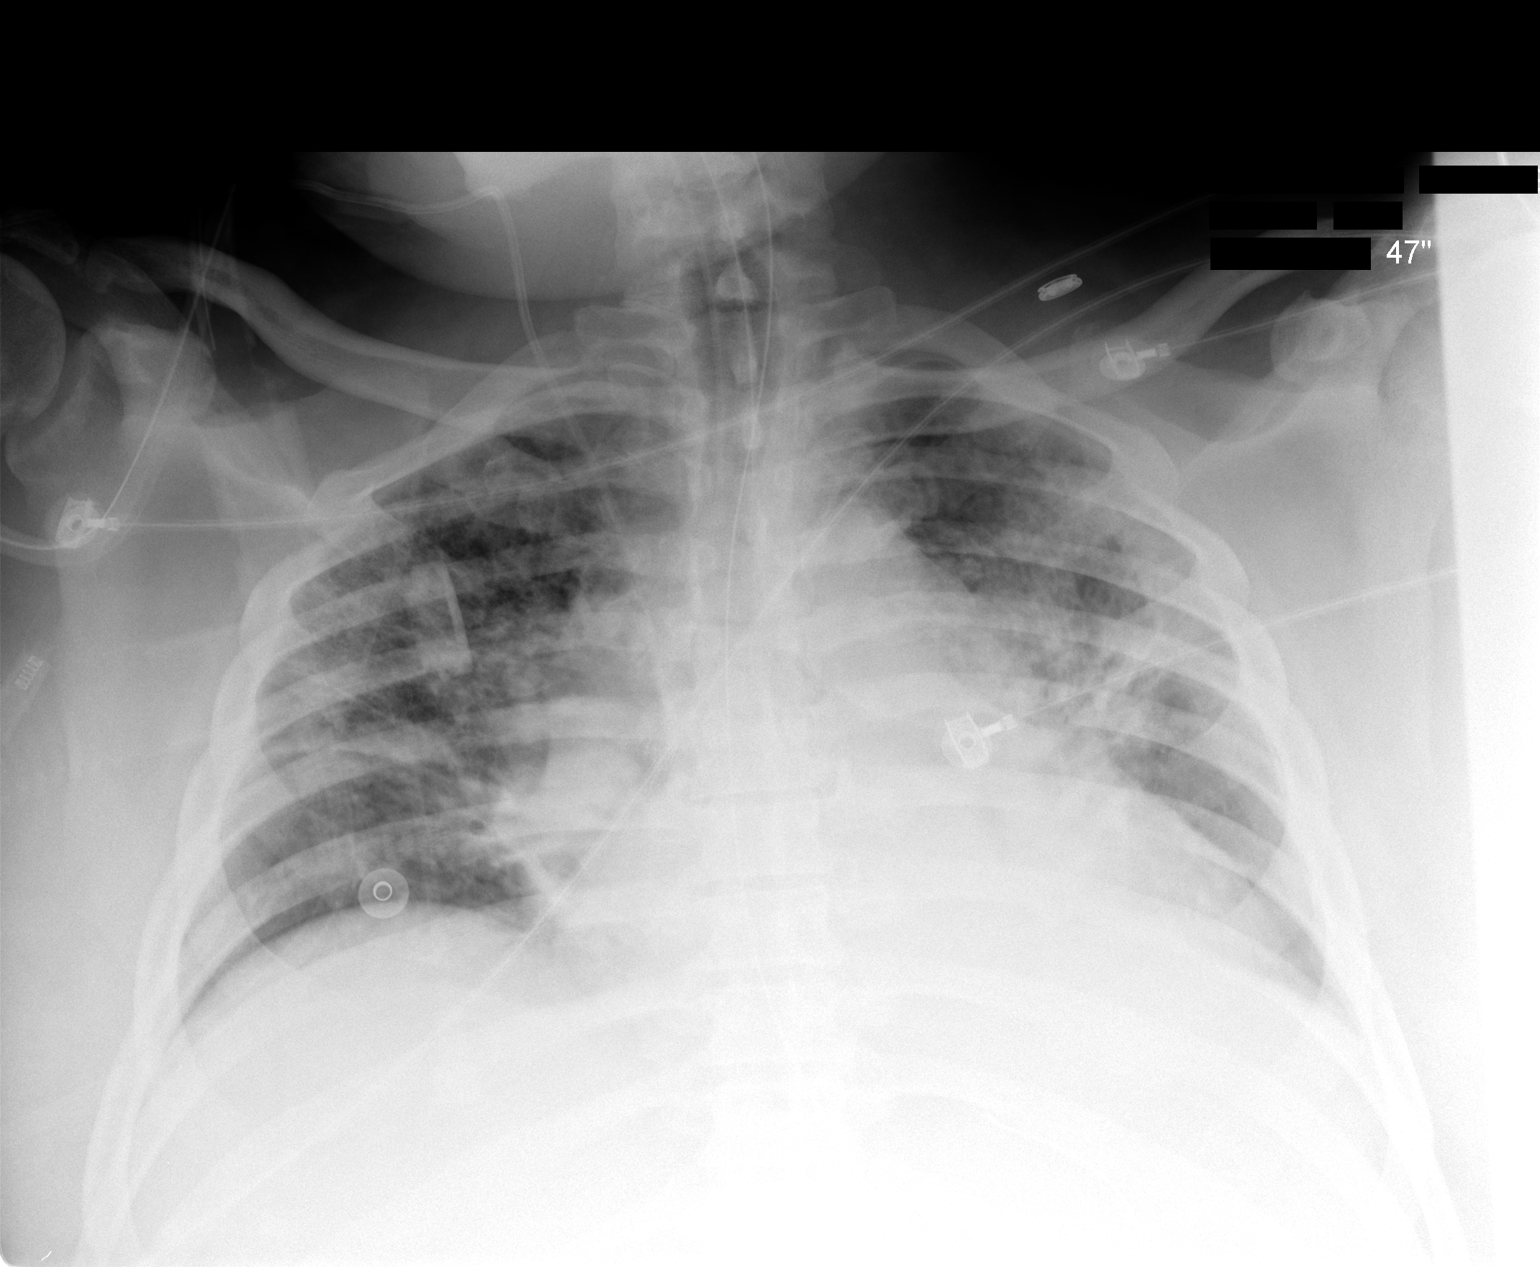

[1 of 1 positions shown; findings below may reference images not displayed]

Endotracheal tube, NG tube, right central venous catheter, cardiomegaly are stable.  Diffuse bilateral airspace disease/edema is slightly increased bilaterally.  Continued left lower lung consolidation noted.
IMPRESSION: Slight increased bilateral airspace disease/edema.

## 2006-07-20 IMAGING — CR DG CHEST 1V PORT
1 series · 1 of 1 positions shown · non-contrast
Comparison: One view chest x-ray earlier today 1315 hours.

CLINICAL DATA: Bedside PICC placement. Acute renal failure. The ventilator
dependent respiratory failure.

PORTABLE CHEST - 1 VIEW  [DATE]/2333 3931 hours:

[view not recorded]
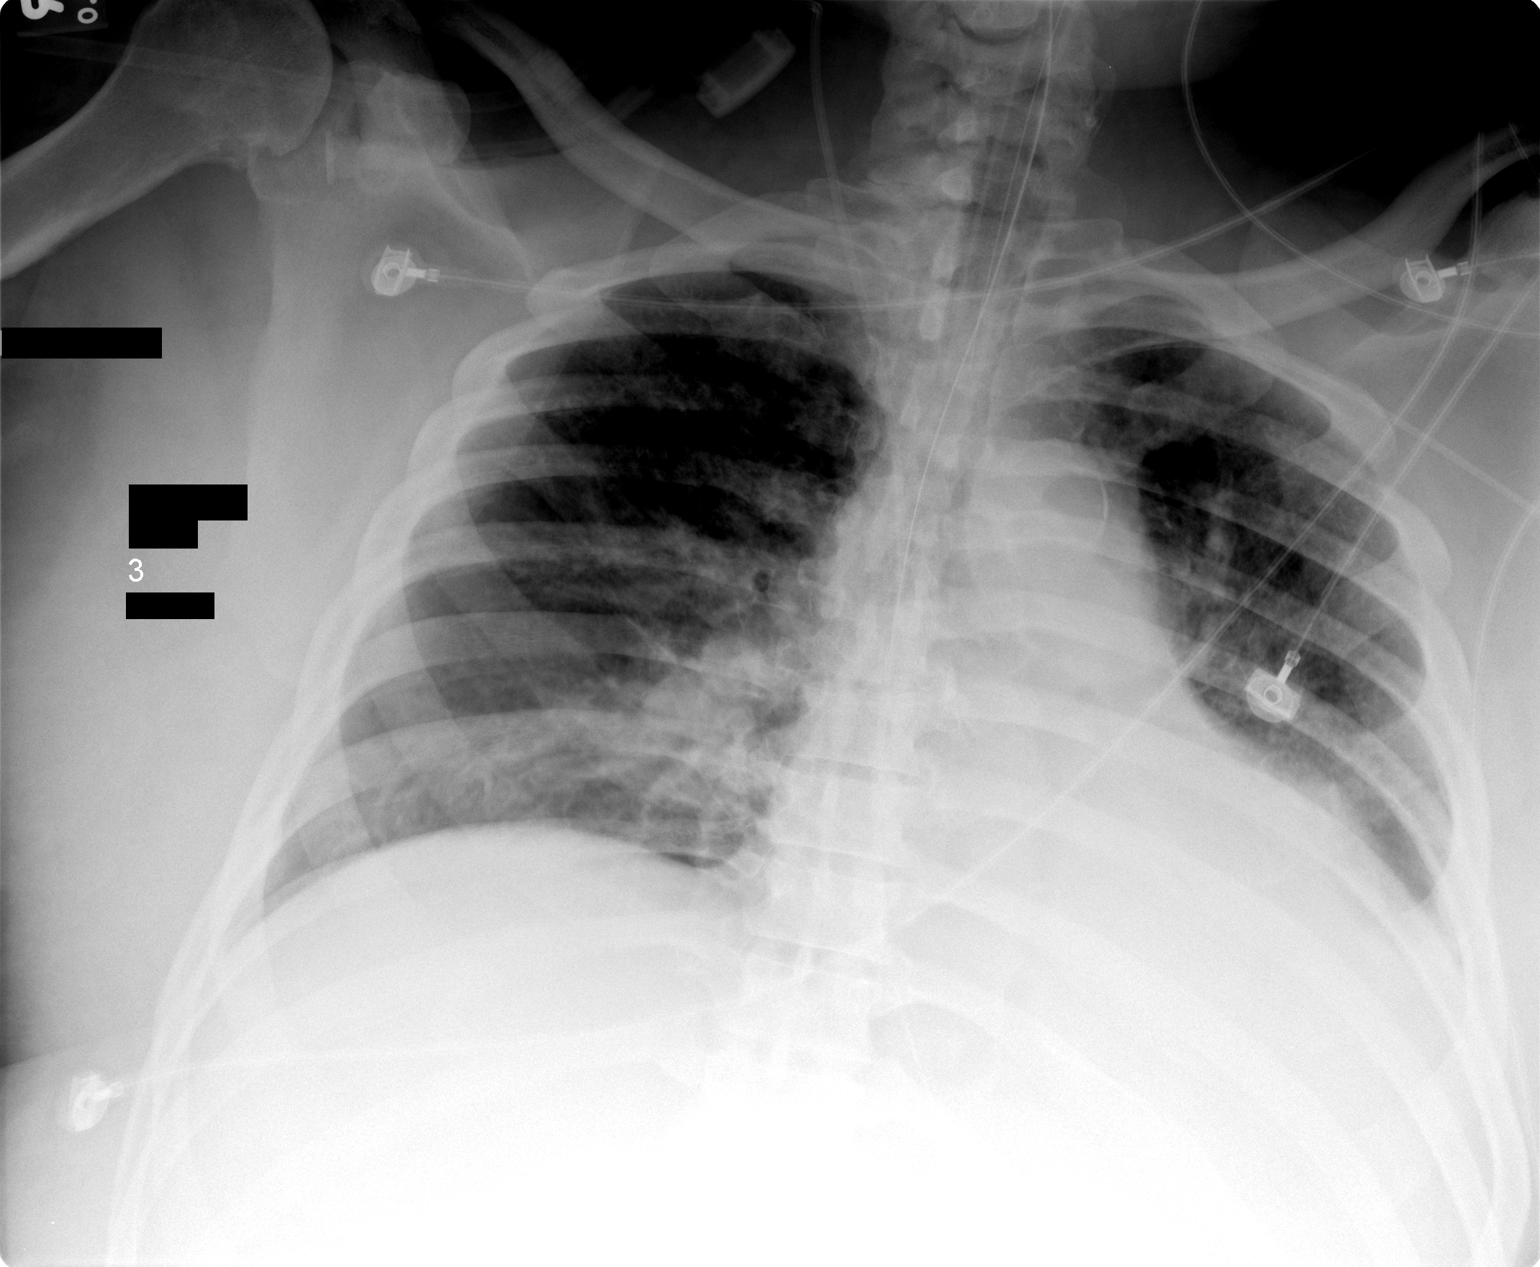

[1 of 1 positions shown; findings below may reference images not displayed]

FINDINGS: The left arm PICC has its tip in the left innominate vein. The
endotracheal tube remains in satisfactory position below the thoracic inlet. The
right jugular central venous catheter tip remains in SVC. The nasogastric tube
courses below the diaphragm. The dense consolidation in the left lower lobe is
unchanged. The milder opacities in the right lung base are also unchanged. No
new pulmonary parenchymal abnormalities are identified
IMPRESSION: 1. Left arm PICC tip in the left innominate vein.

2. Stable dense left basilar atelectasis versus pneumonia and milder right
basilar airspace disease.

3. Remaining tubes and lines satisfactory. No new abnormalities.

## 2006-07-20 IMAGING — CR DG CHEST 1V PORT
1 series · 1 of 1 positions shown · non-contrast
Comparison: 10/25/2004

CLINICAL DATA: CHF, COPD, ventilator

PORTABLE CHEST - 1 VIEW:

[view not recorded]
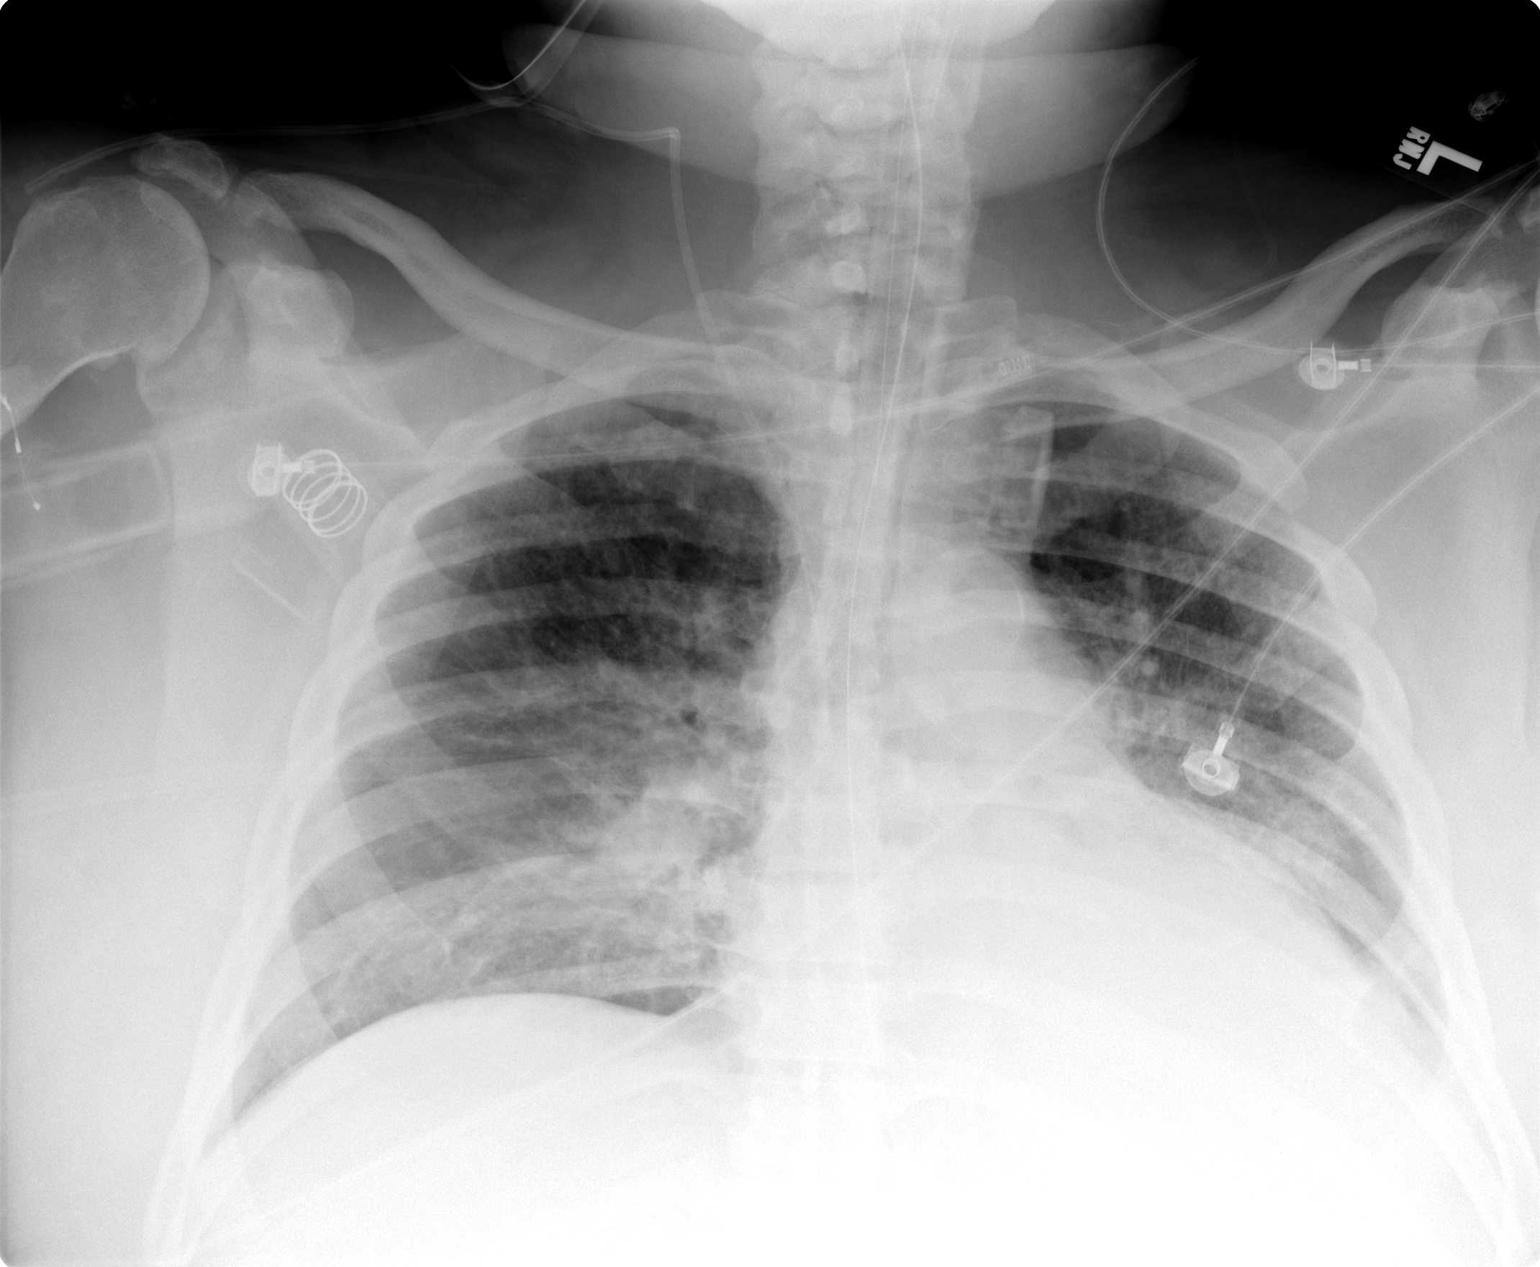

[1 of 1 positions shown; findings below may reference images not displayed]

FINDINGS: Support devices are unchanged. Slight decrease in bilateral airspace
disease. Focal left lower lobe atelectasis or infiltrate with probable small
effusion again noted.
IMPRESSION: Decreasing edema.

Stable left lower lobe atelectasis/infiltrate with small left effusion.

## 2006-07-21 IMAGING — CR DG ABD PORTABLE 1V
1 series · 1 of 1 positions shown · non-contrast
Comparison: none

CLINICAL DATA: Congestive heart failure and renal failure.  On ventilator.  Feeding tube placement. 
 PORTABLE ABDOMEN: 
 A panda feeding tube is seen with the tip in the proximal stomach.  A nasogastric tube is also seen with the tip in the fundus of the stomach.  Gaseous distention of the stomach is noted.  No dilated bowel loops are noted.

[view not recorded]
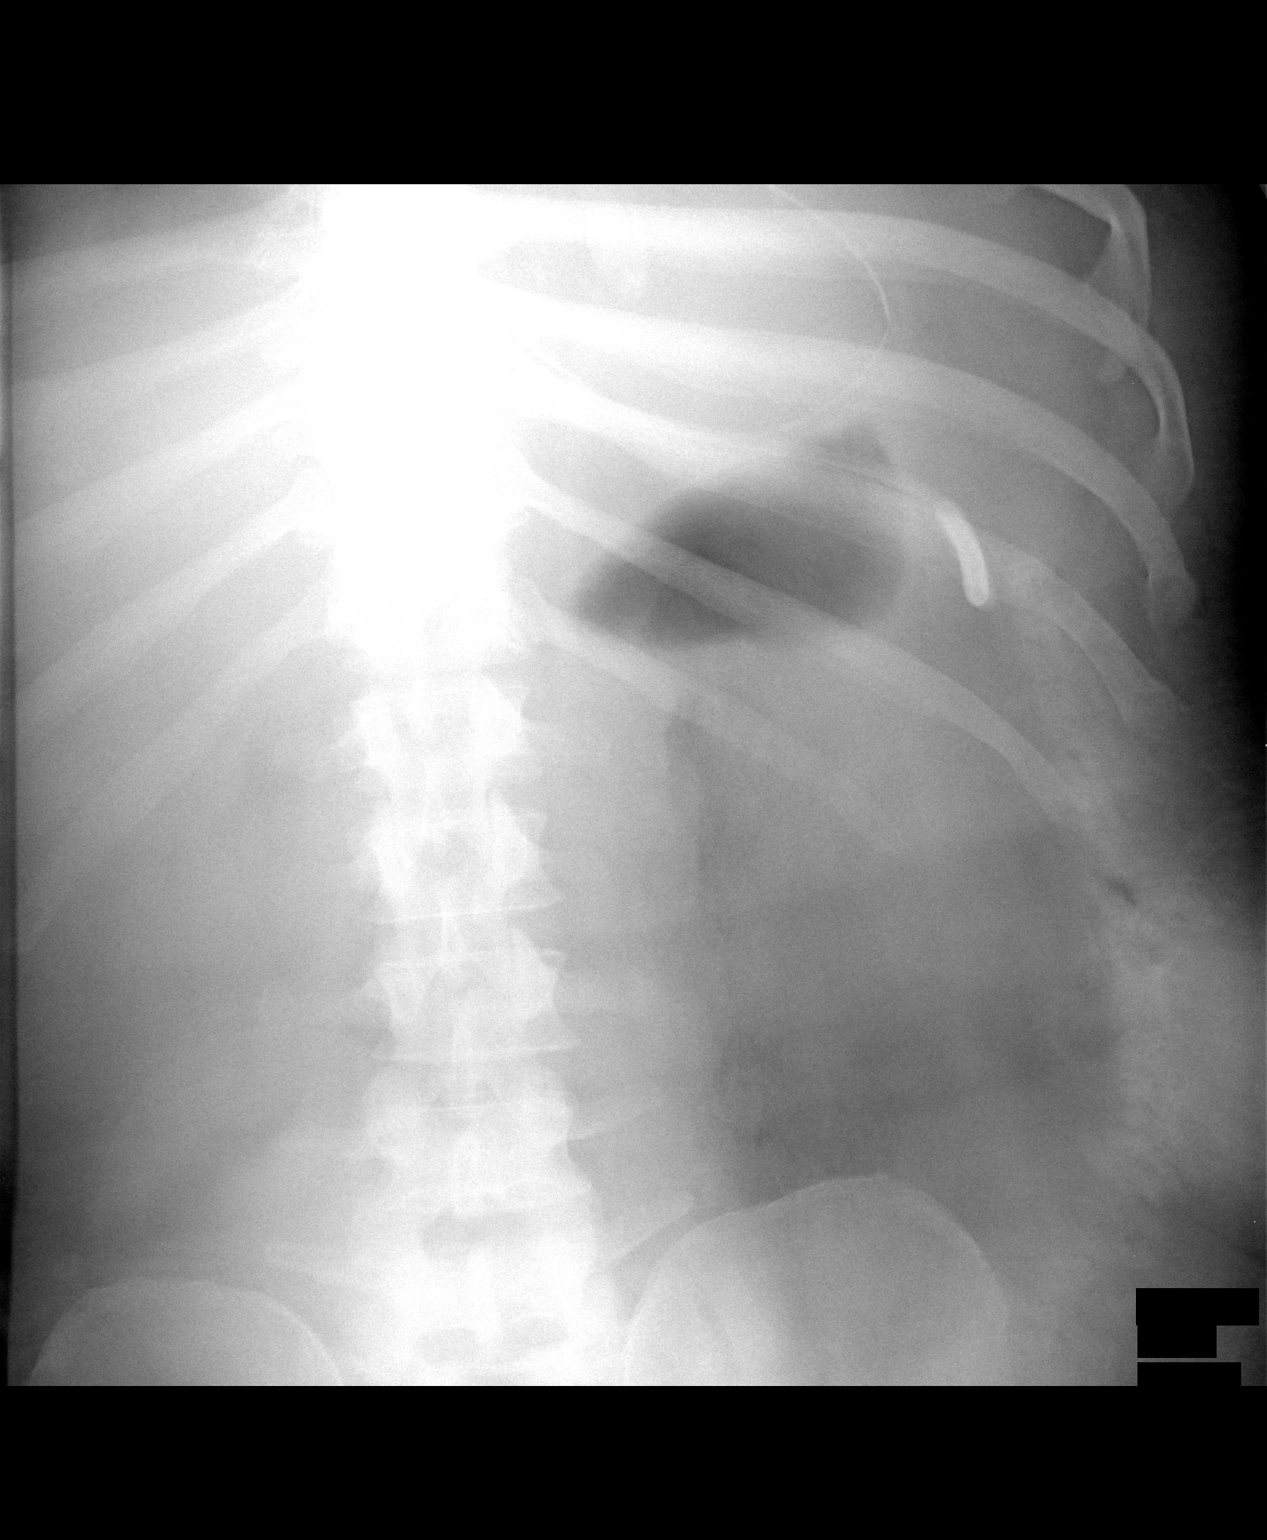

[1 of 1 positions shown; findings below may reference images not displayed]

IMPRESSION: Panda feeding tube tip in proximal stomach.  Nasogastric tube tip is in the fundus of the stomach.

## 2006-07-21 IMAGING — CR DG CHEST 1V PORT
1 series · 1 of 1 positions shown · non-contrast
Comparison: 10/26/04.

CLINICAL DATA: Acute renal failure.  CHF, follow-up. 
 CHEST PORTABLE ? 1 VIEW:

[view not recorded]
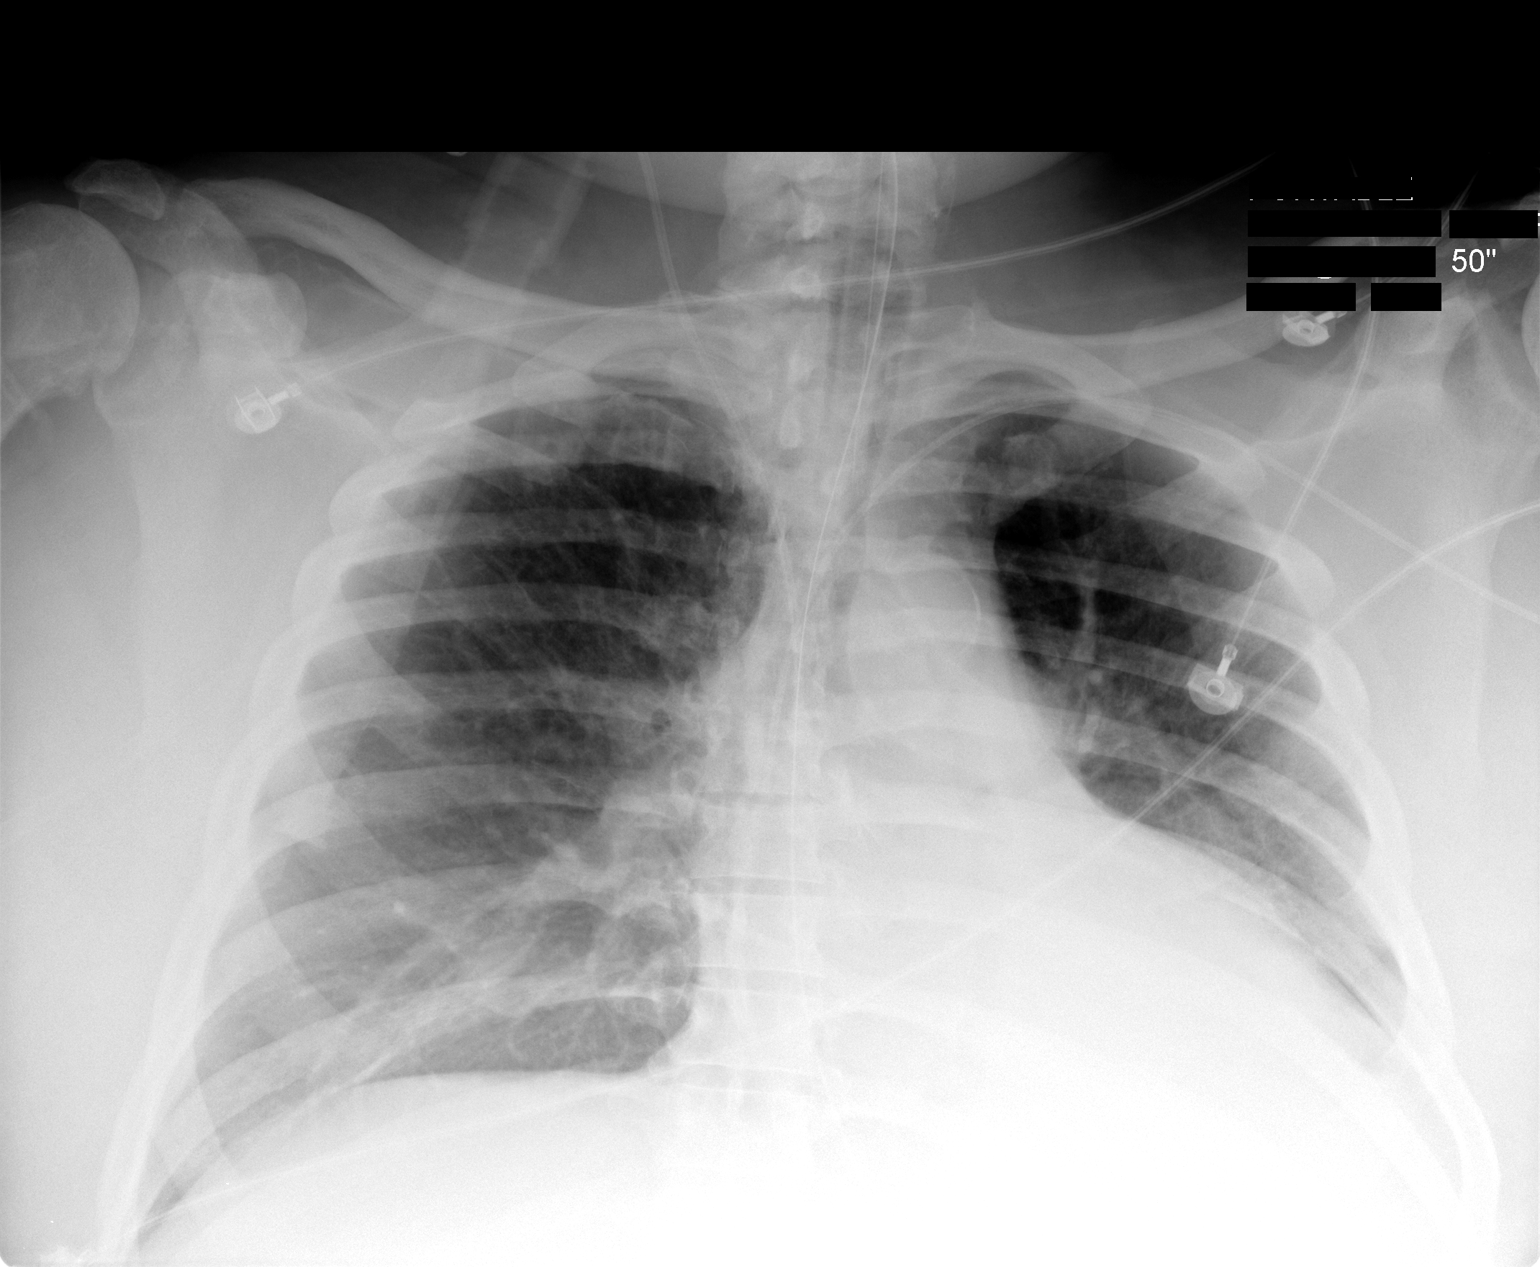

[1 of 1 positions shown; findings below may reference images not displayed]

Endotracheal tube, NG tube, right central venous catheter, left-sided PICC line, cardiomegaly, right basilar atelectasis, and left lower lung consolidation are stable except for slight advancement of the left-sided PICC line tip overlying the mid SVC.  No other changes are identified.
IMPRESSION: Left-sided PICC line with tip overlying the SVC.  Otherwise stable chest.

## 2006-07-23 IMAGING — CR DG CHEST 1V PORT
1 series · 1 of 1 positions shown · non-contrast
Comparison: 10/27/2004.

CLINICAL DATA: Acute renal failure.  CHF. 
 PORTABLE CHEST:

[view not recorded]
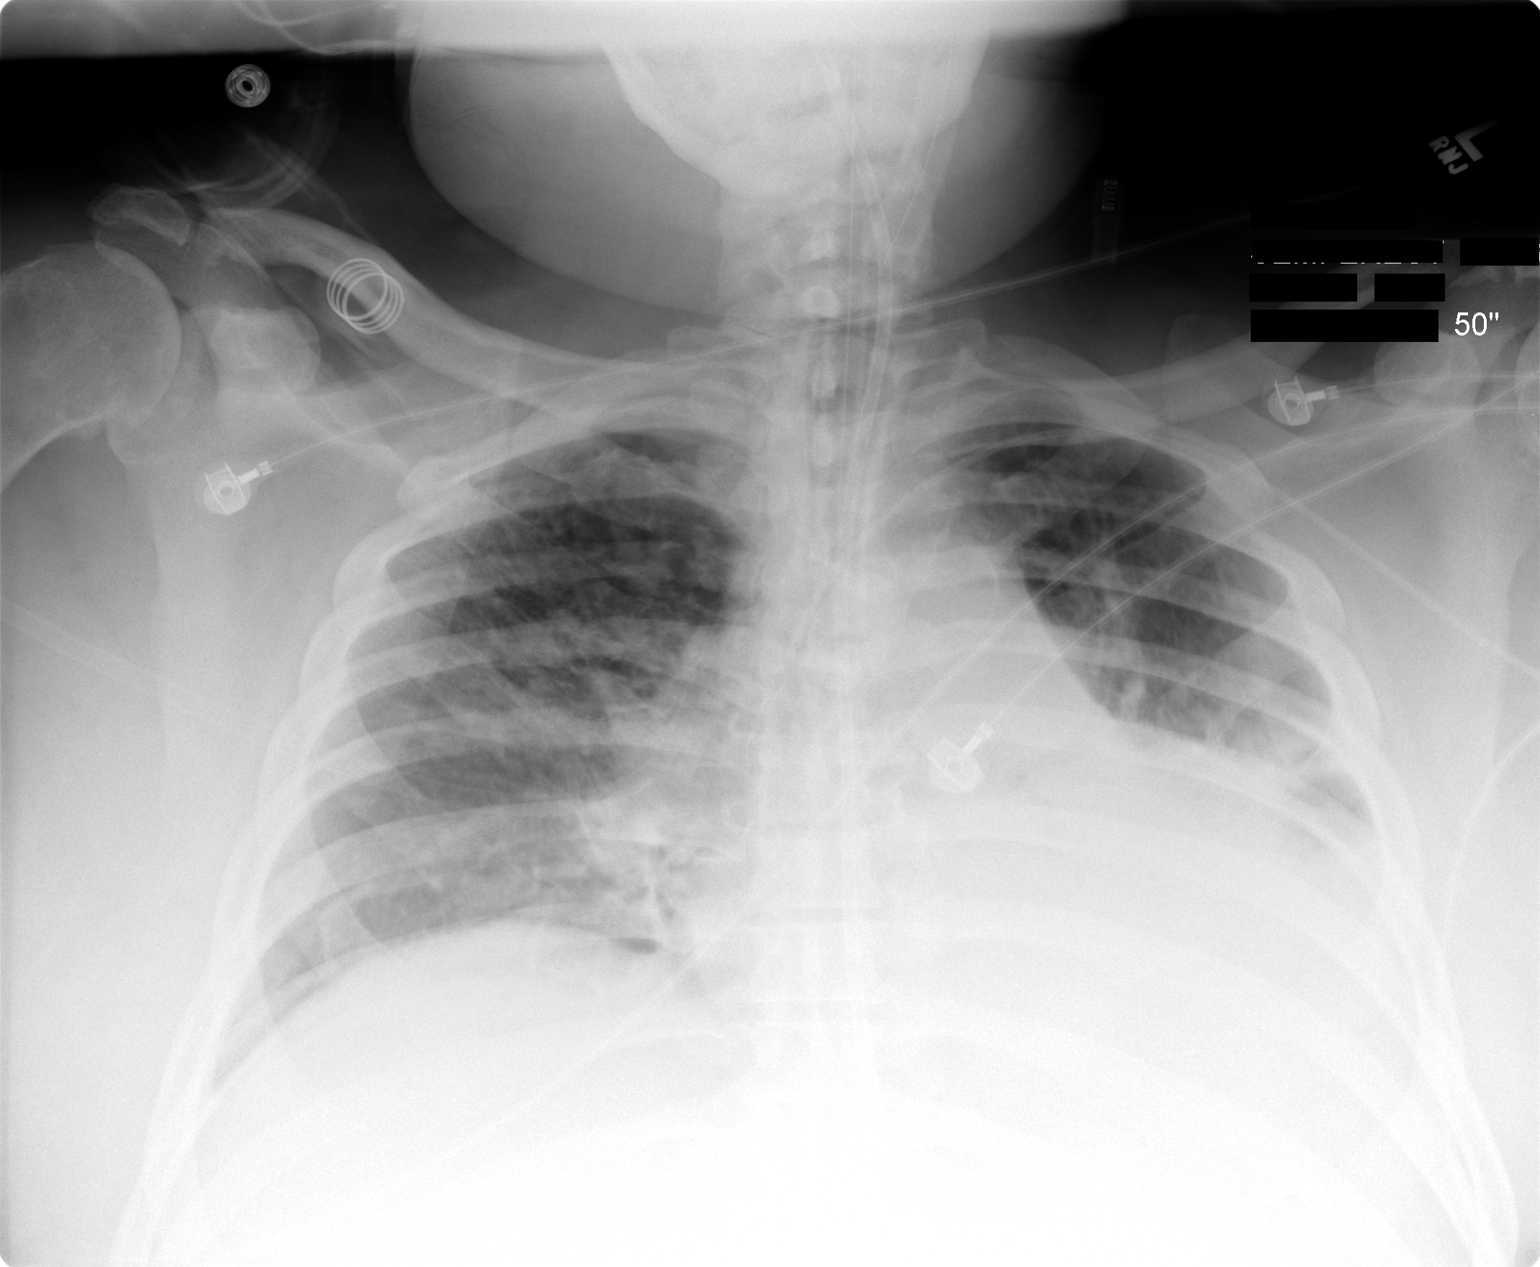

[1 of 1 positions shown; findings below may reference images not displayed]

Increasing interstitial edema noted.  Continued consolidation/atelectasis in the left lower lung with increasing bibasilar atelectasis noted.  Endotracheal tube, small-bore feeding tube, left-sided PICC line are unchanged.  Right central venous catheter has been removed.
IMPRESSION: Increasing interstitial edema and bibasilar atelectasis.

## 2006-07-23 IMAGING — CR DG ABD PORTABLE 1V
2 series · 2 of 2 positions shown · non-contrast
Comparison: none

CLINICAL DATA: Acute renal failure.  Abdominal distention.
 PORTABLE ABDOMEN ? 1 VIEW:
 Two supine views of the abdomen done at 4755 hours are compared with priors done 10/27/04.  Feeding tube remains in the distal body of the stomach.  Nasogastric tube is no longer visualized.  The abdomen remains nearly gasless and protuberant.  No dilated loops of bowel are seen.  Advanced bilateral hip arthropathy is again noted.

[view not recorded (1 of 2)]
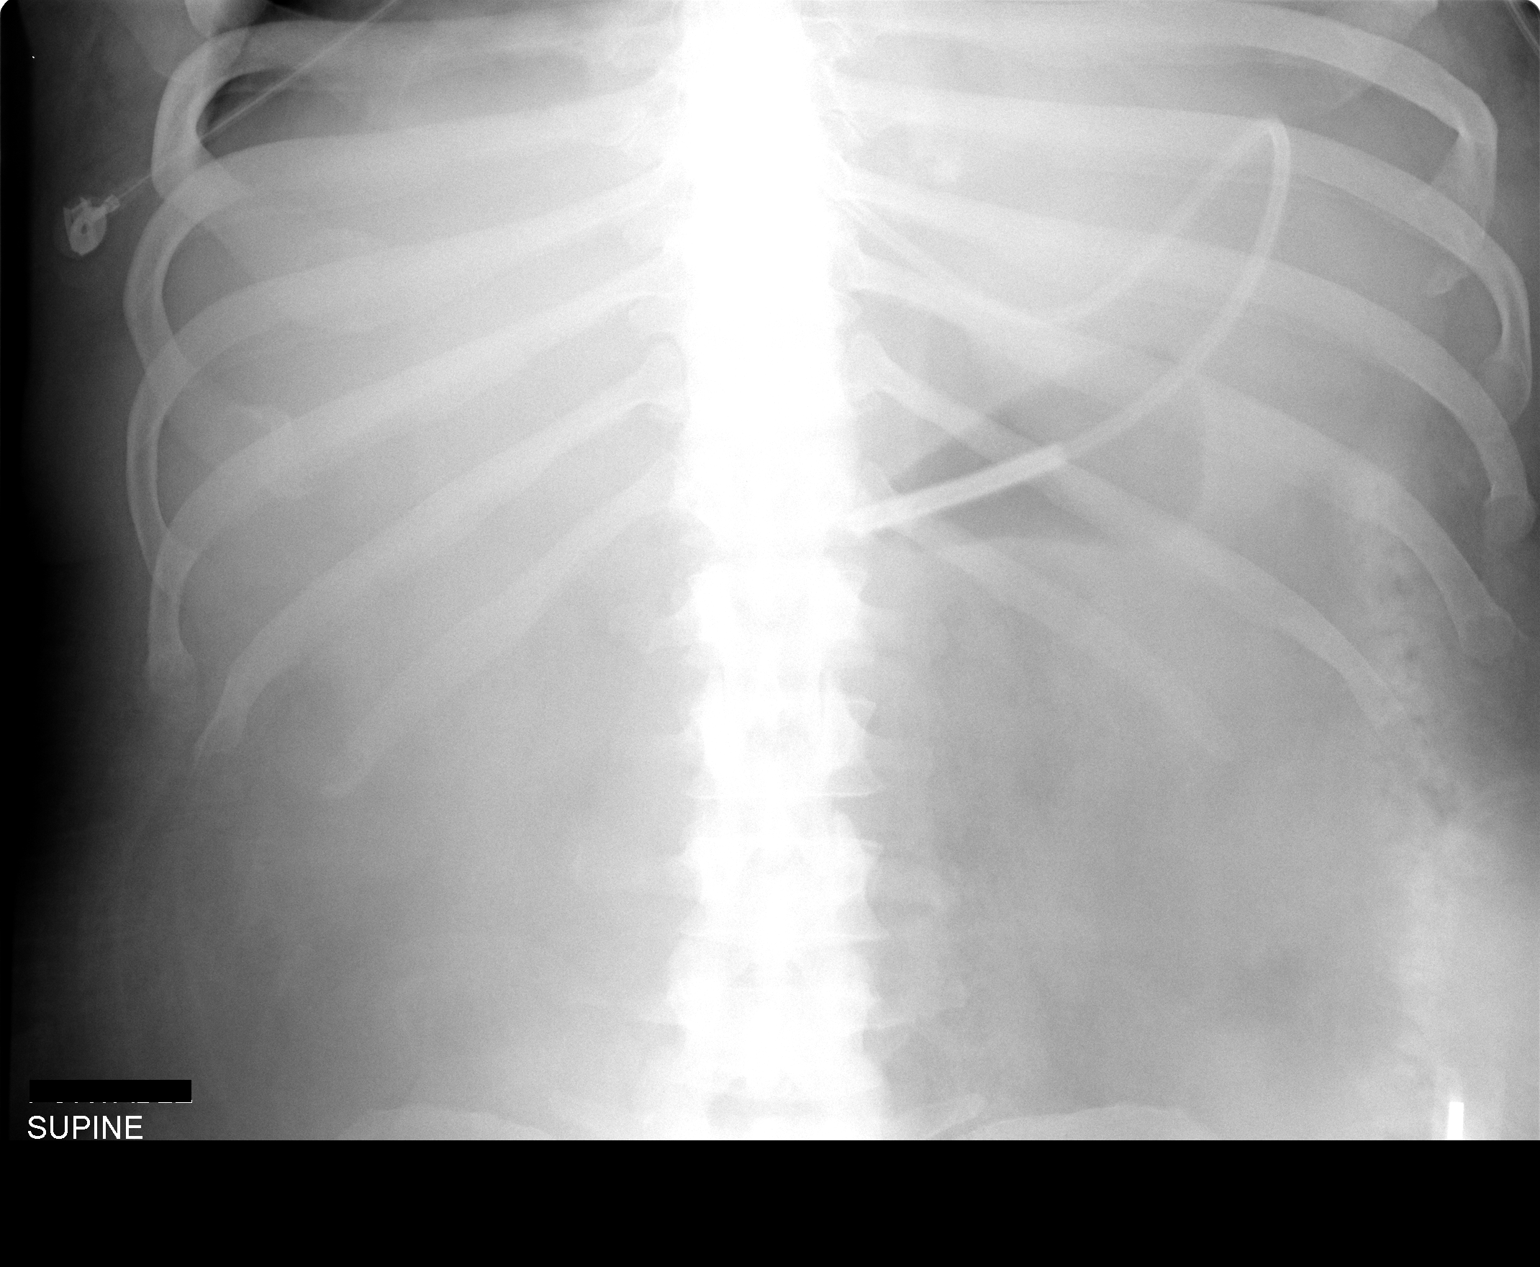

[view not recorded (2 of 2)]
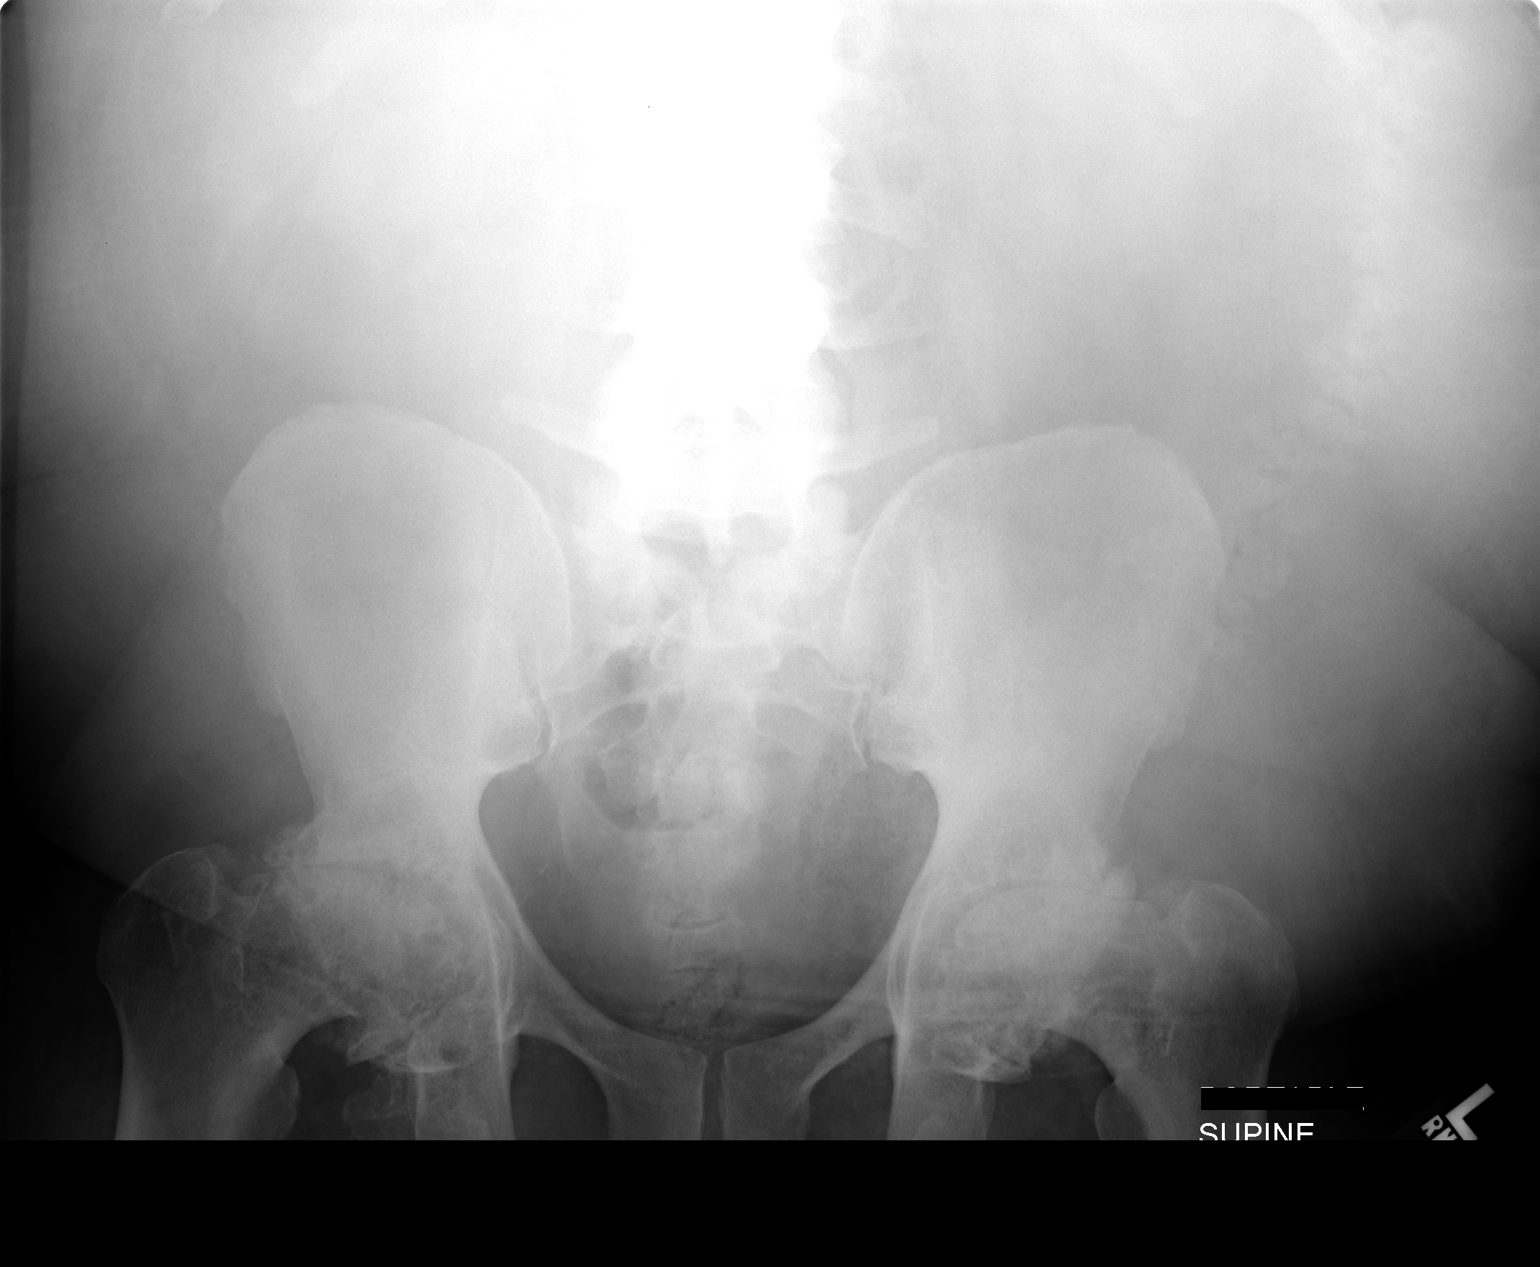

[2 of 2 positions shown; findings below may reference images not displayed]

IMPRESSION: Feeding tube is in the distal stomach.  Nearly gasless abdomen without apparent bowel distention.

## 2006-07-24 IMAGING — CR DG ABD PORTABLE 1V
1 series · 1 of 1 positions shown · non-contrast
Comparison: 10/29/2004

CLINICAL DATA: Feeding tube placement

PORTABLE ABDOMEN - 1 VIEW

[view not recorded]
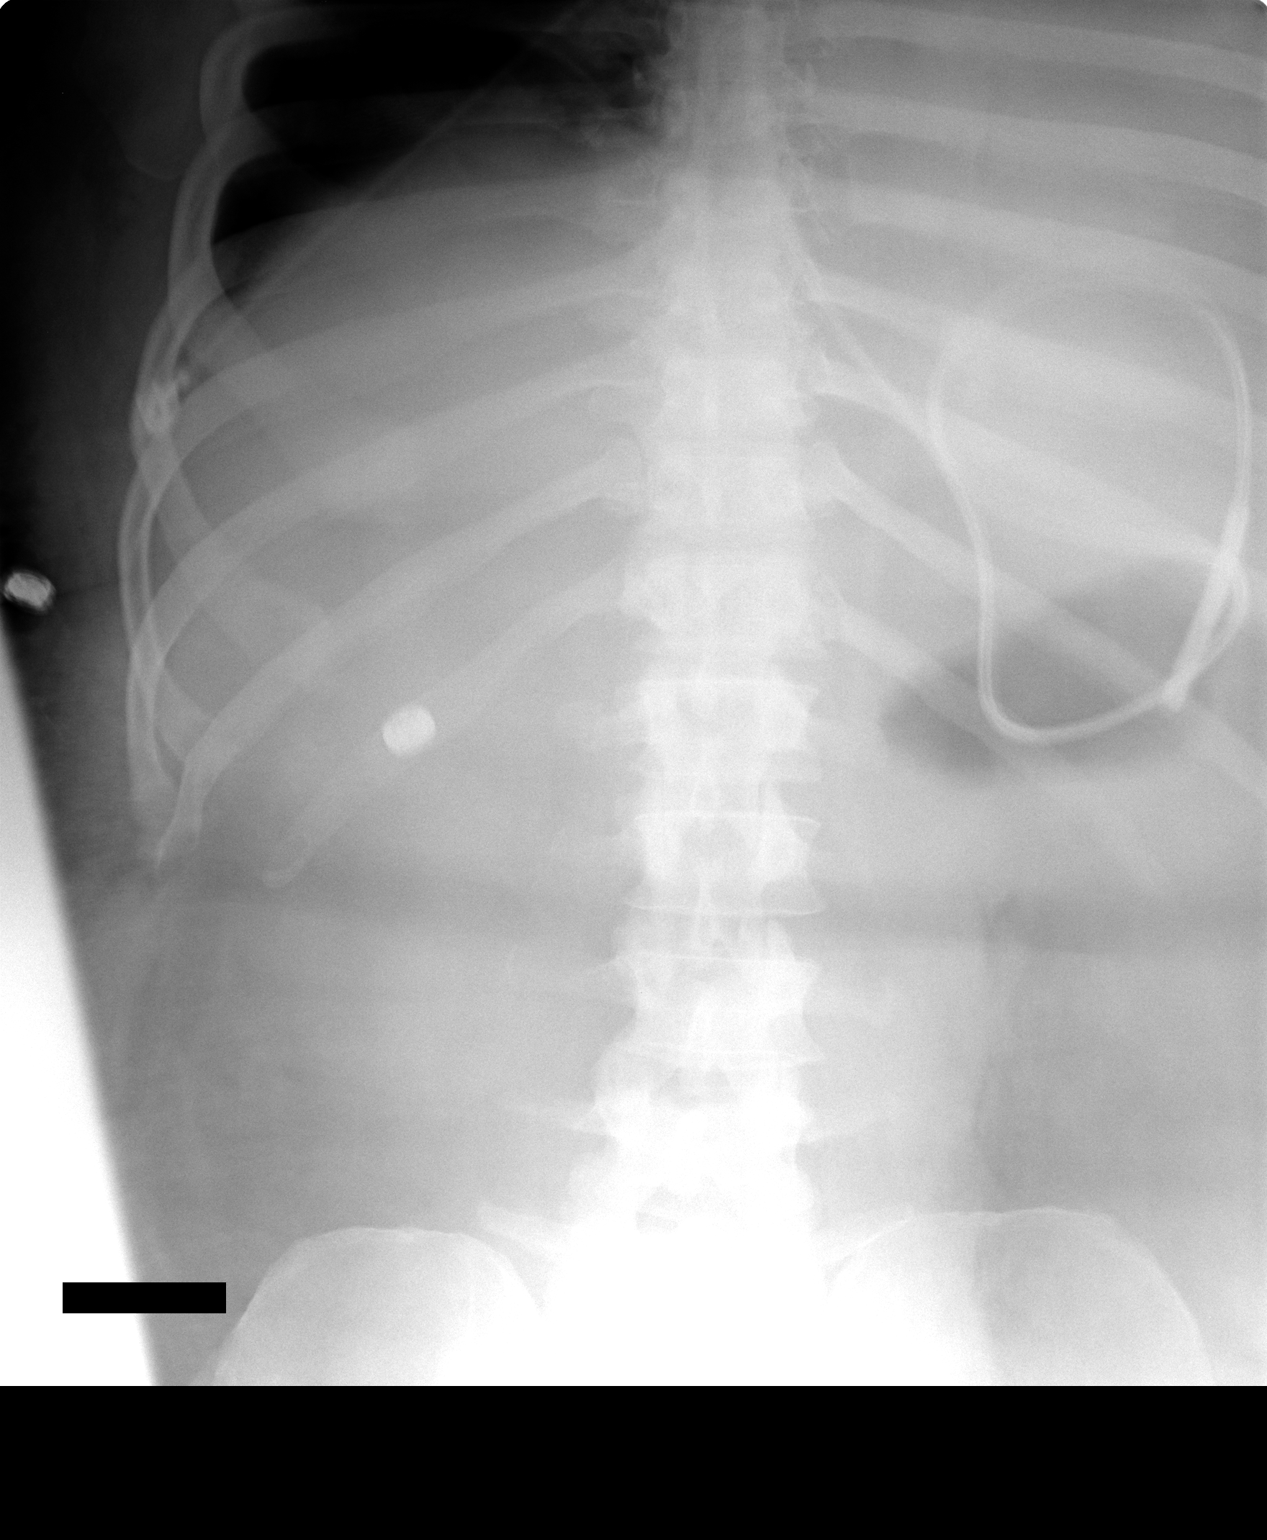

[1 of 1 positions shown; findings below may reference images not displayed]

FINDINGS: Feeding tube now coils in the fundus of the stomach. The tip is in
the body of the stomach.

IMPRESSION

Feeding tube coiling in the fundus of the stomach with tip in the body of the
stomach.

## 2006-07-24 IMAGING — CR DG ABD PORTABLE 1V
1 series · 1 of 1 positions shown · non-contrast
Comparison: none

CLINICAL DATA: Feeding tube placement. 
 PORTABLE ABDOMEN 10/30/04 TAKEN AT 4158 HOURS:
 Feeding tube has been placed and is looped upon itself within the body of the stomach with its tip directed toward the fundus of the stomach.  There is motion present on the study and it is difficult to visualize the tip well.

[view not recorded]
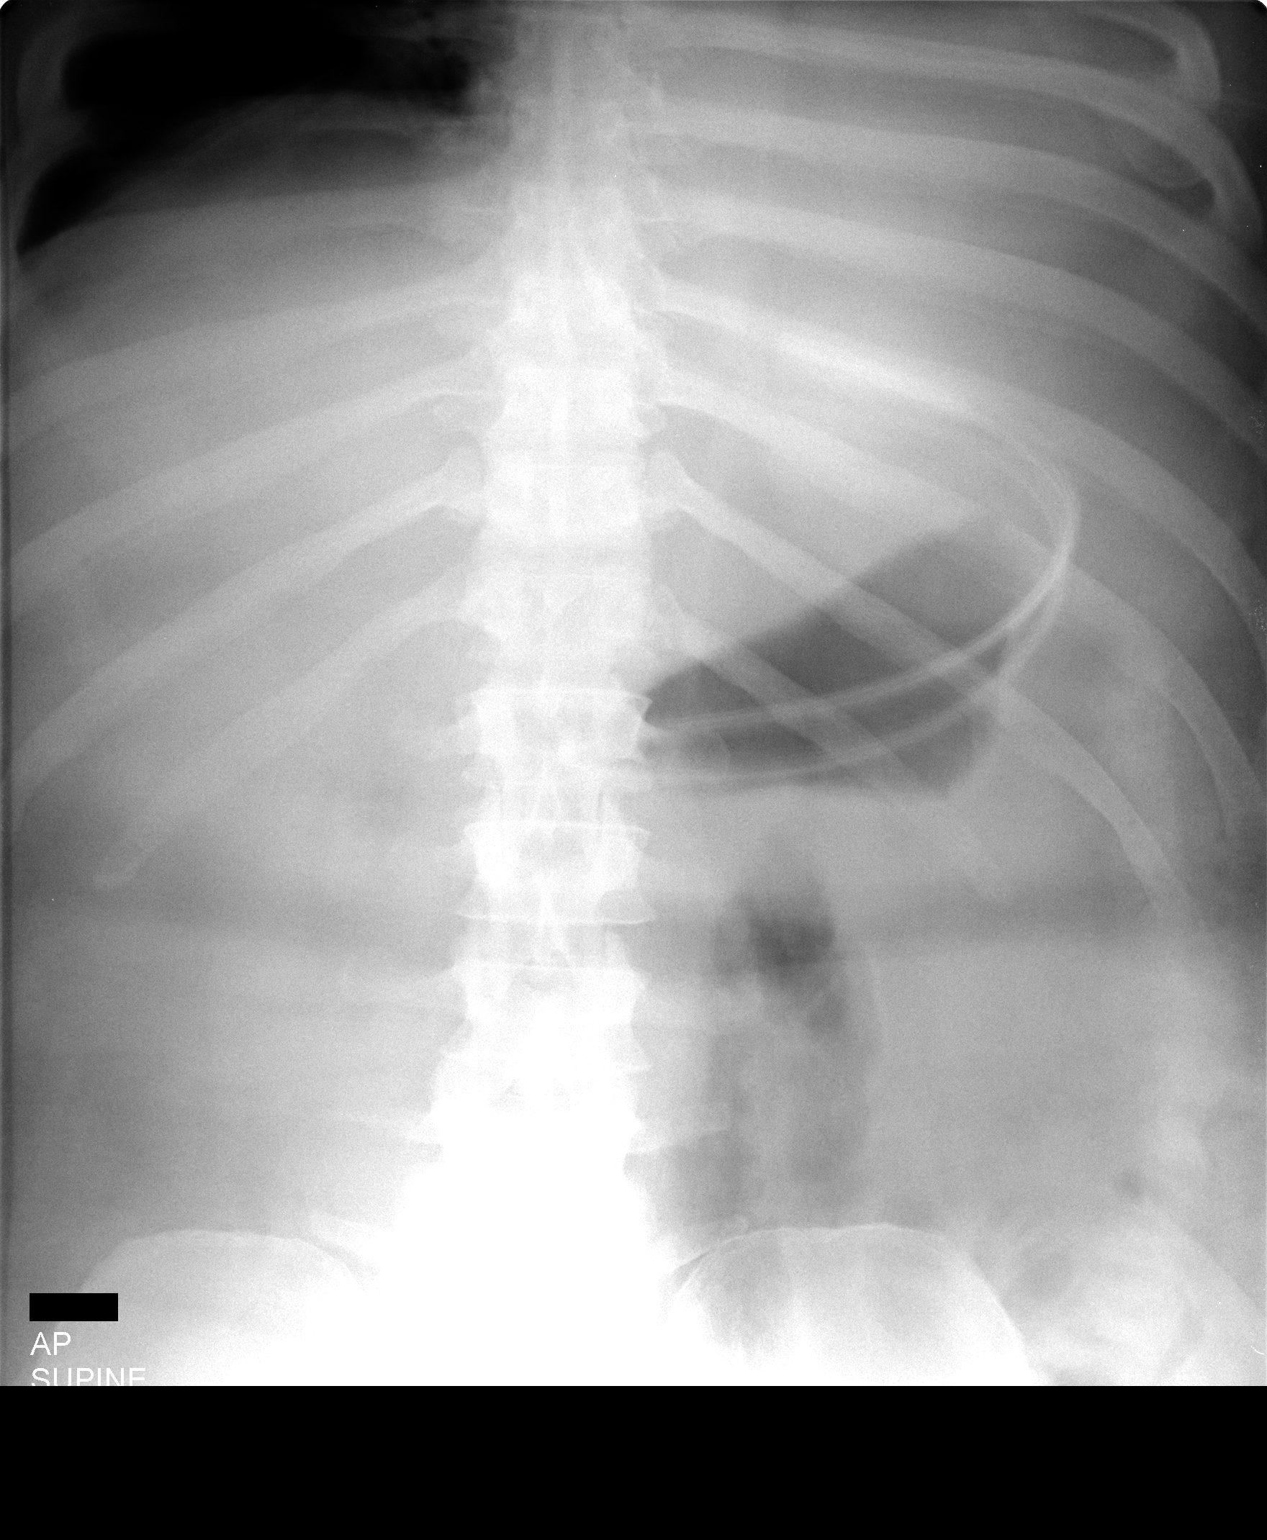

[1 of 1 positions shown; findings below may reference images not displayed]

IMPRESSION: Feeding tube looped upon itself within the body of the stomach with the tip directed towards the fundus of the stomach.  It is difficult to visualize the tip of the tube well due to motion artifact.

## 2006-07-24 IMAGING — RF DG FLUORO RM 1-60 MIN
1 series · 4 of 4 positions shown · non-contrast
Comparison: none

CLINICAL DATA: Acute renal failure.  Please place a feeding tube fluoroscopically.  
 FLUOROSCOPY:
 Under fluoroscopy (in the intensive care unit), a feeding tube was place with the tip of the tube eventually located at the level of the ligament of Treitz.

[Series 1: run · 4 of 4 slices shown]
[im 1/4]
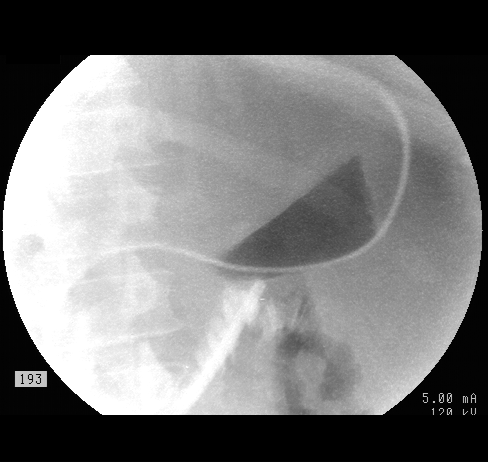
[im 2/4]
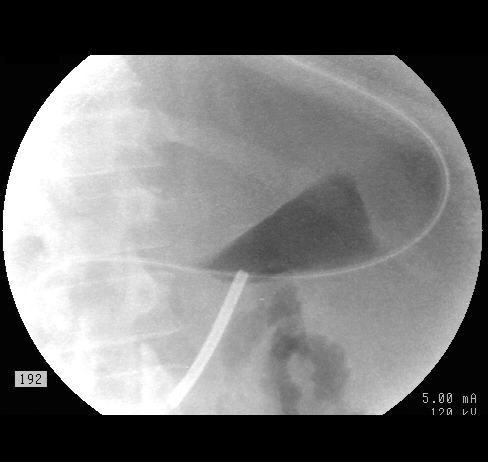
[im 3/4]
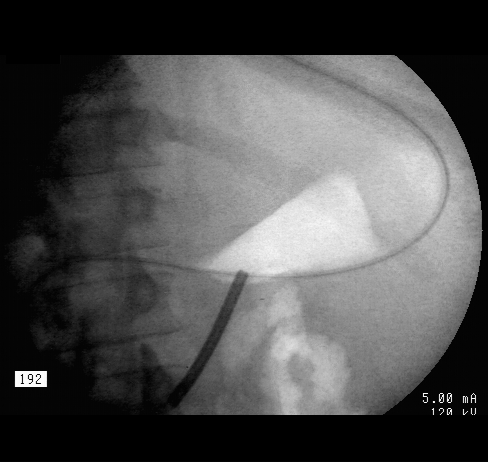
[im 4/4]
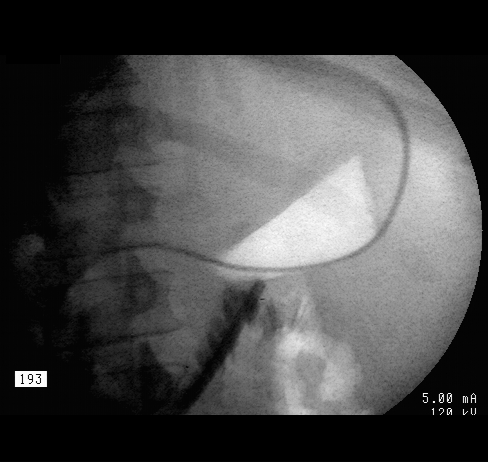

[4 of 4 positions shown; findings below may reference images not displayed]

IMPRESSION: Placement of feeding tube under fluoroscopic guidance with the tip of the feeding tube at the level of the ligament of Treitz.

## 2006-07-26 IMAGING — CR DG ABD PORTABLE 1V
2 series · 2 of 2 positions shown · non-contrast
Comparison: 10/30/04.

CLINICAL DATA: Distended abdomen.  
 PORTABLE ABDOMEN:

[view not recorded (1 of 2)]
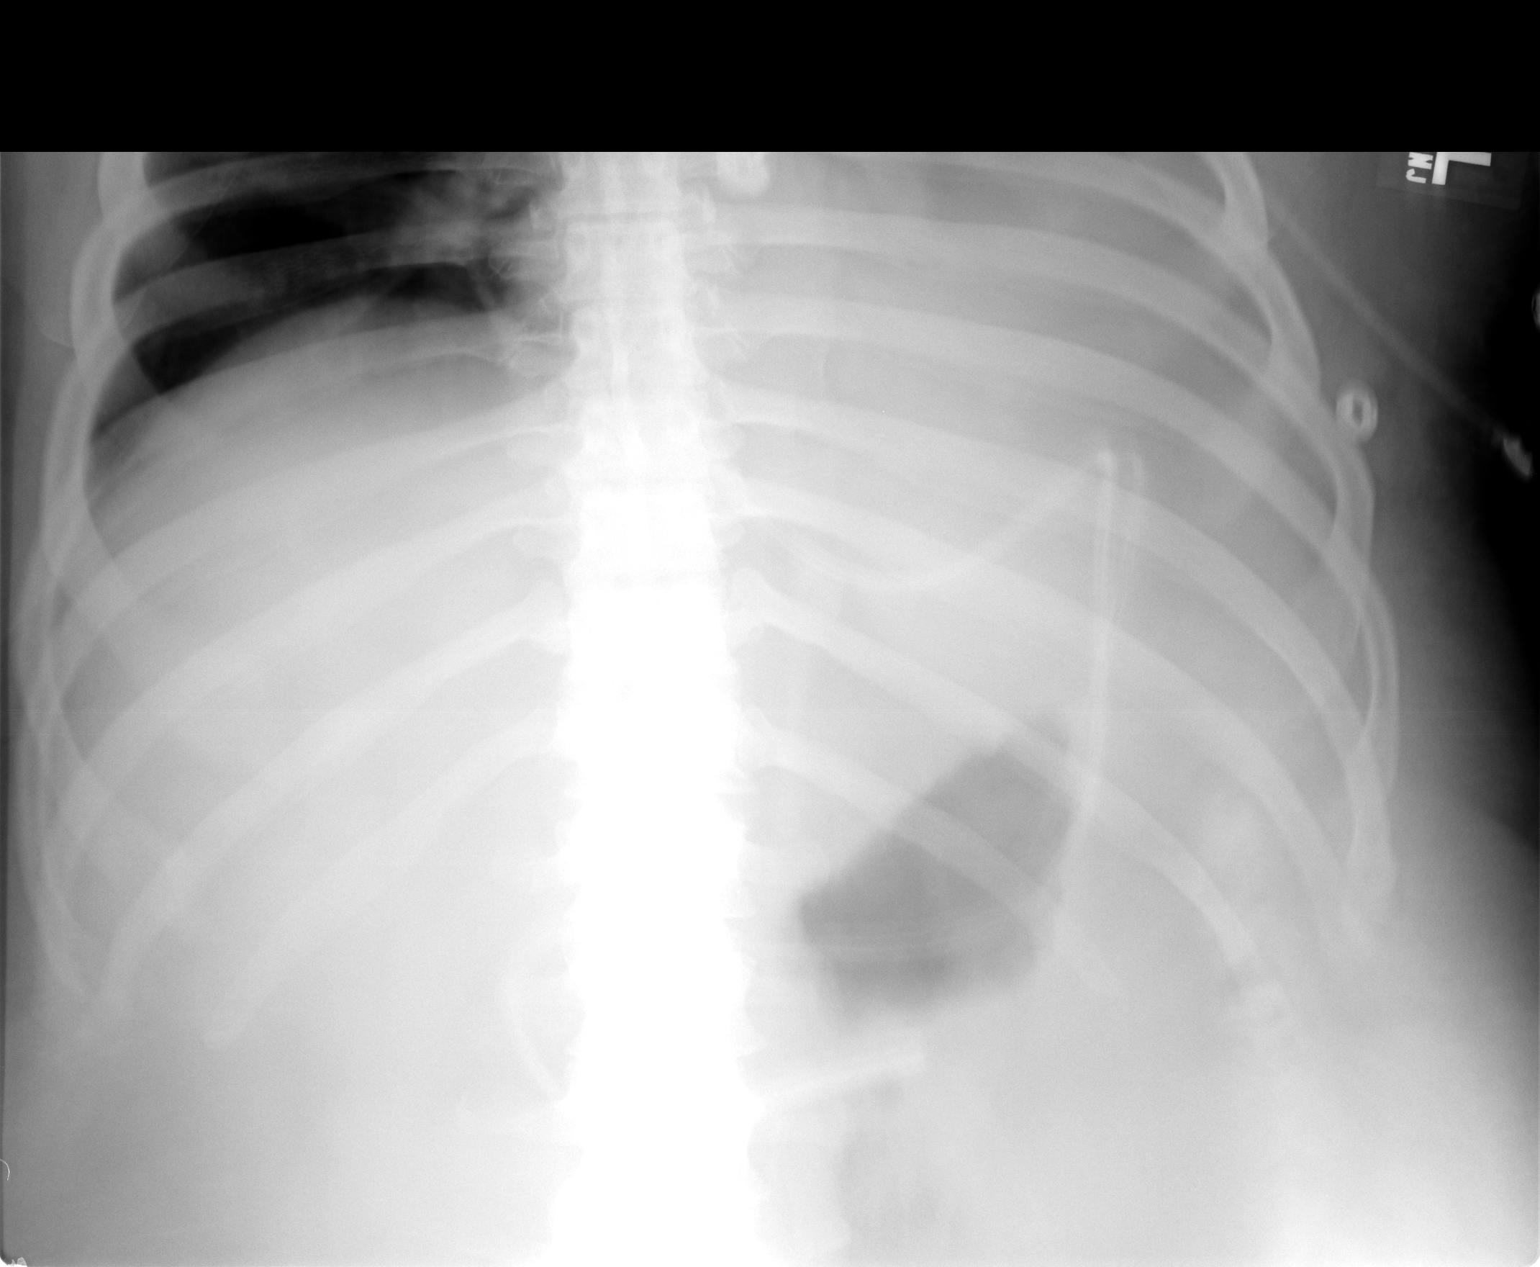

[view not recorded (2 of 2)]
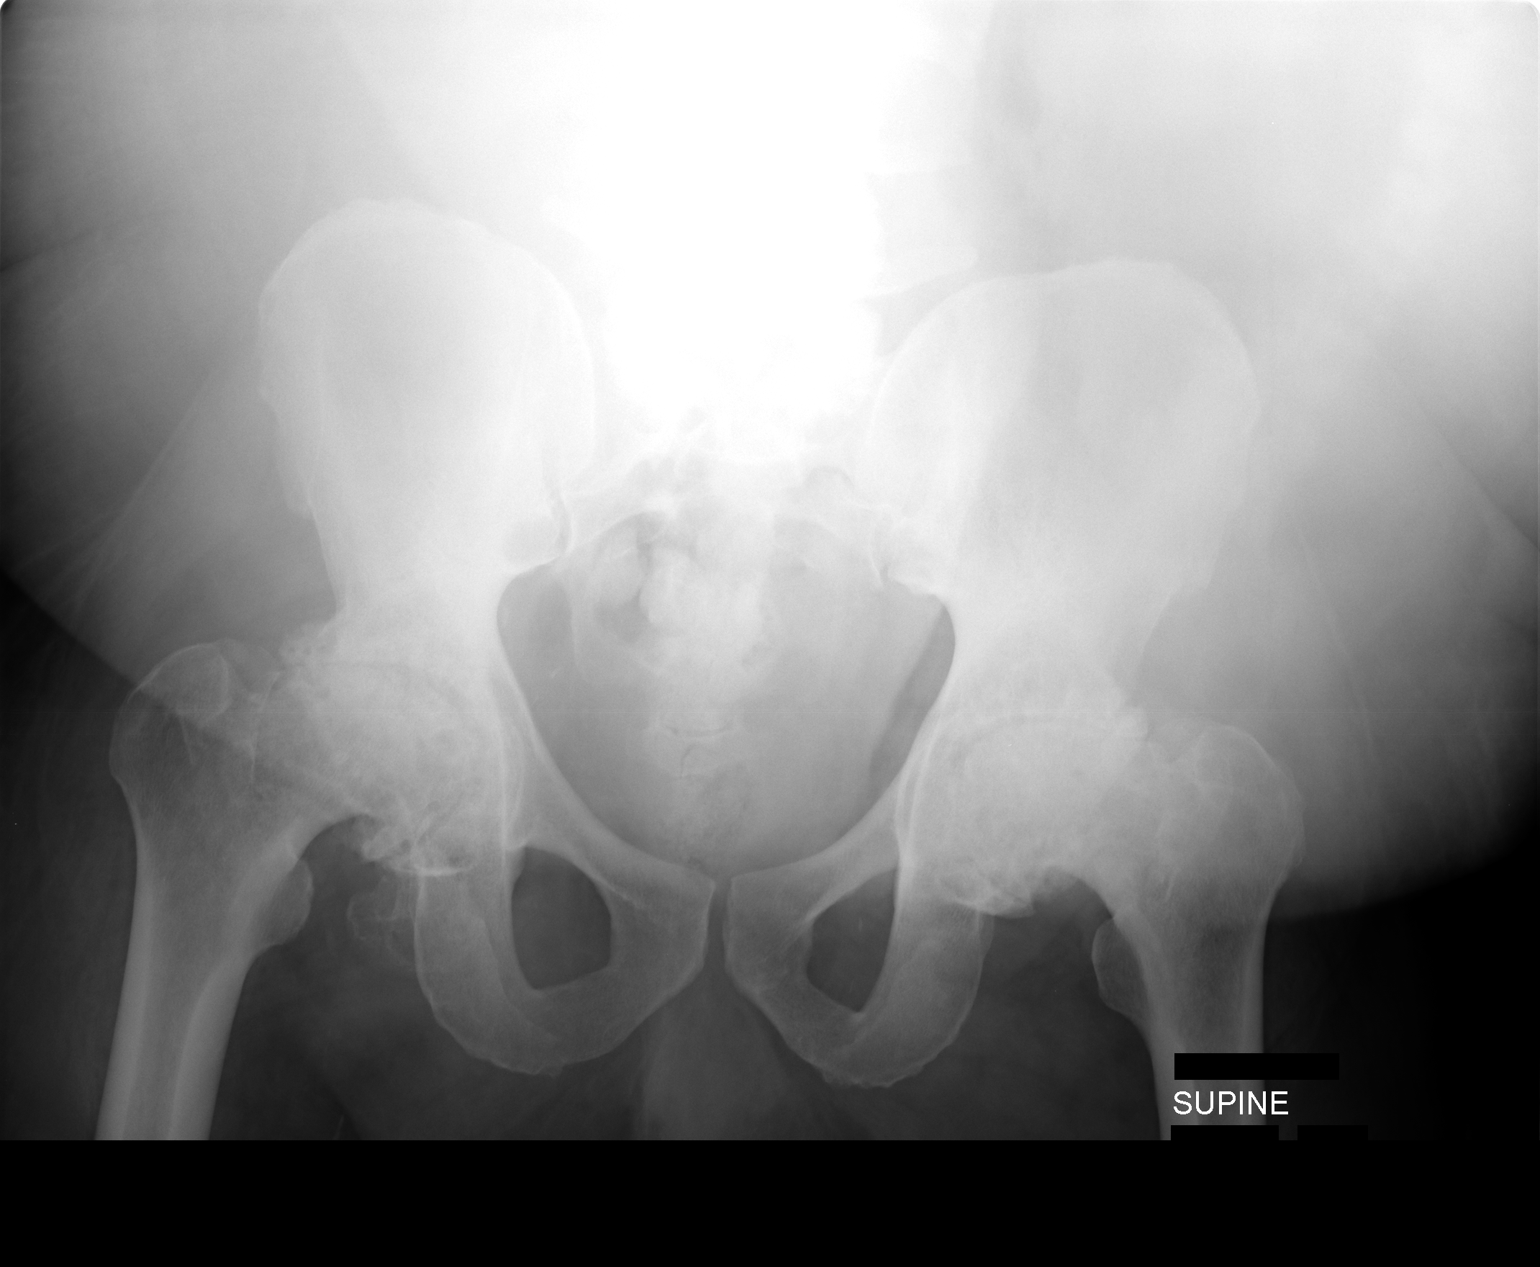

[2 of 2 positions shown; findings below may reference images not displayed]

A paucity of bowel gas is seen.  A small amount of gas is again noted within the stomach.  A feeding tube is seen with the tip in the fourth portion of the duodenum.  Severe arthritis is seen involving both hips.
IMPRESSION: 1.  Paucity of bowel gas.  No dilated bowel loops.
 2.  Panda feeding tube tip in distal duodenum.

## 2006-07-27 IMAGING — CR DG CHEST 1V PORT
1 series · 1 of 1 positions shown · non-contrast
Comparison: 10/31/04.

CLINICAL DATA: Congestive heart failure and acute renal failure. 
 PORTABLE CHEST:

[view not recorded]
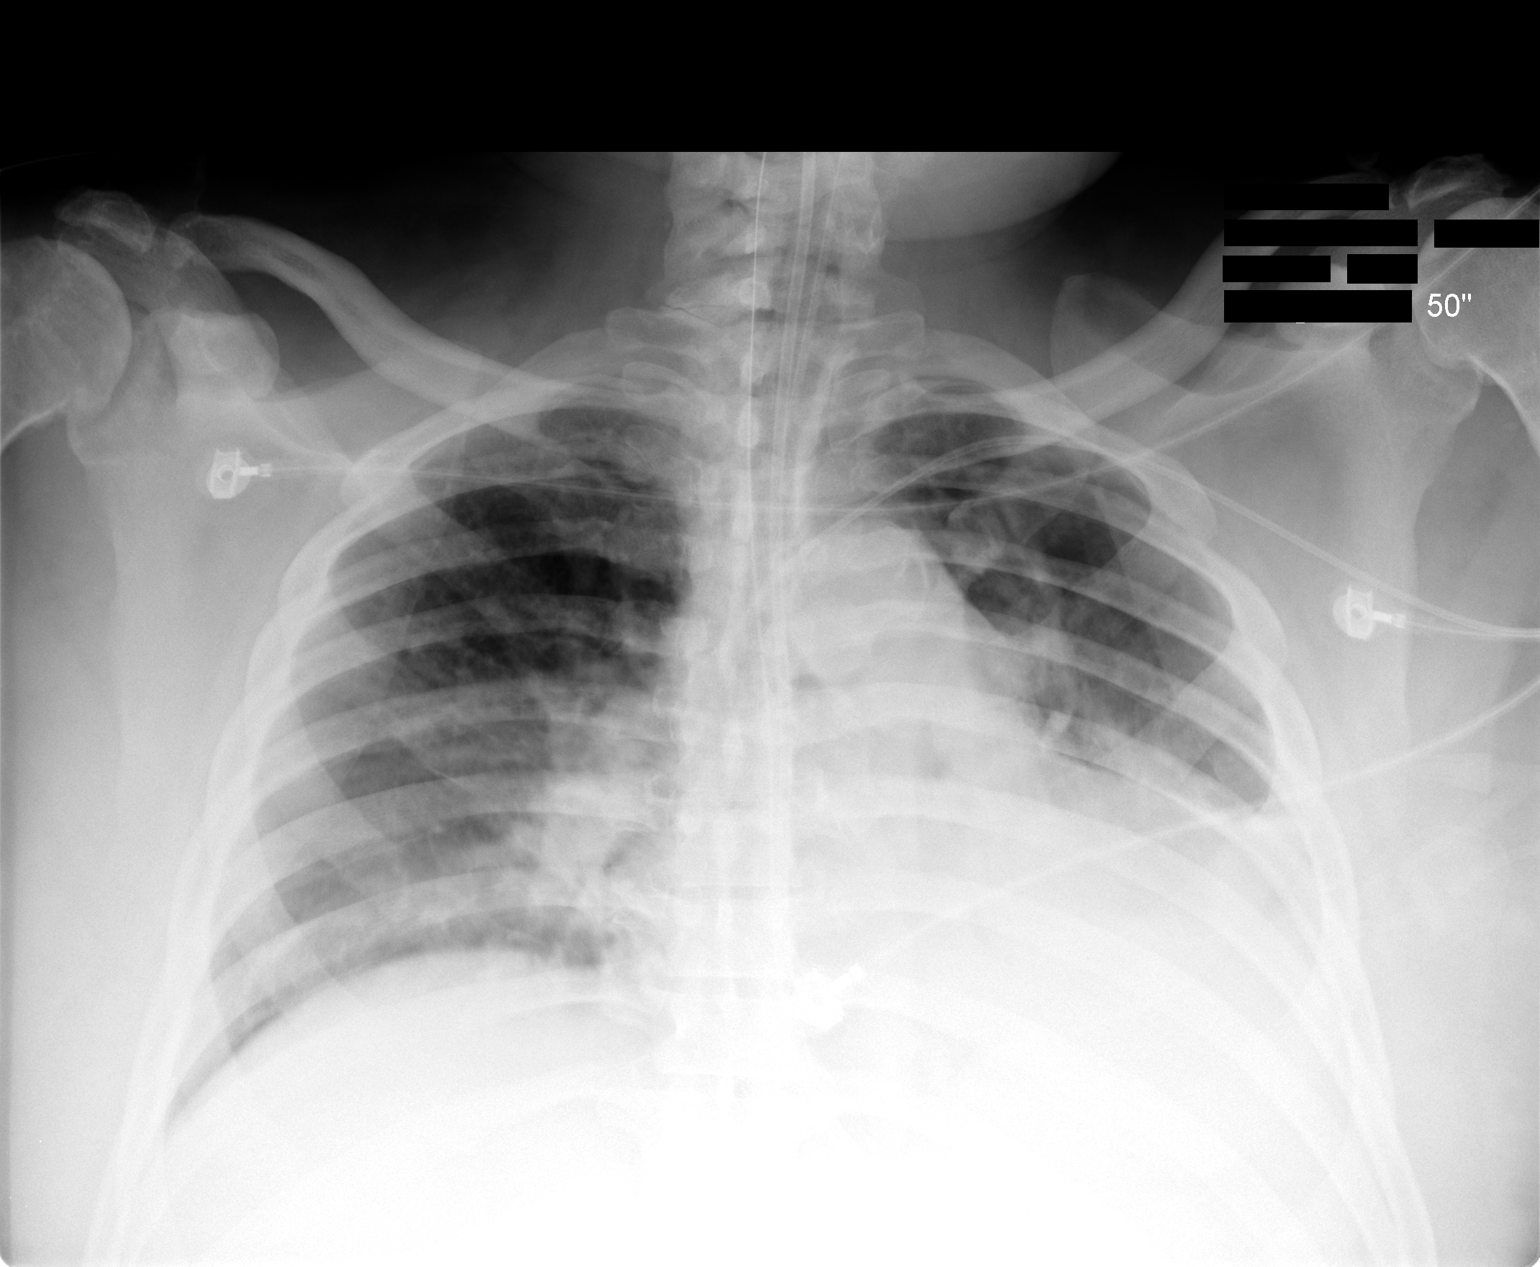

[1 of 1 positions shown; findings below may reference images not displayed]

Endotracheal tube tip appears to be at the level of the thoracic inlet, slightly more proximal than previously. 
 Feeding tube tip is below the diaphragm.  PICC line tip overlies the superior vena cava.  The pulmonary edema has improved bilaterally although there is a persistent area of consolidation at the left lung base.  No other change.
IMPRESSION: Improving pulmonary edema.

## 2006-07-27 IMAGING — CR DG CHEST 1V PORT
1 series · 1 of 1 positions shown · non-contrast
Comparison: [DATE] hours.

CLINICAL DATA: Renal and respiratory failure.  Status-post surgery. 
 PORTABLE CHEST ? 1 VIEW ([DATE] HOURS):

[view not recorded]
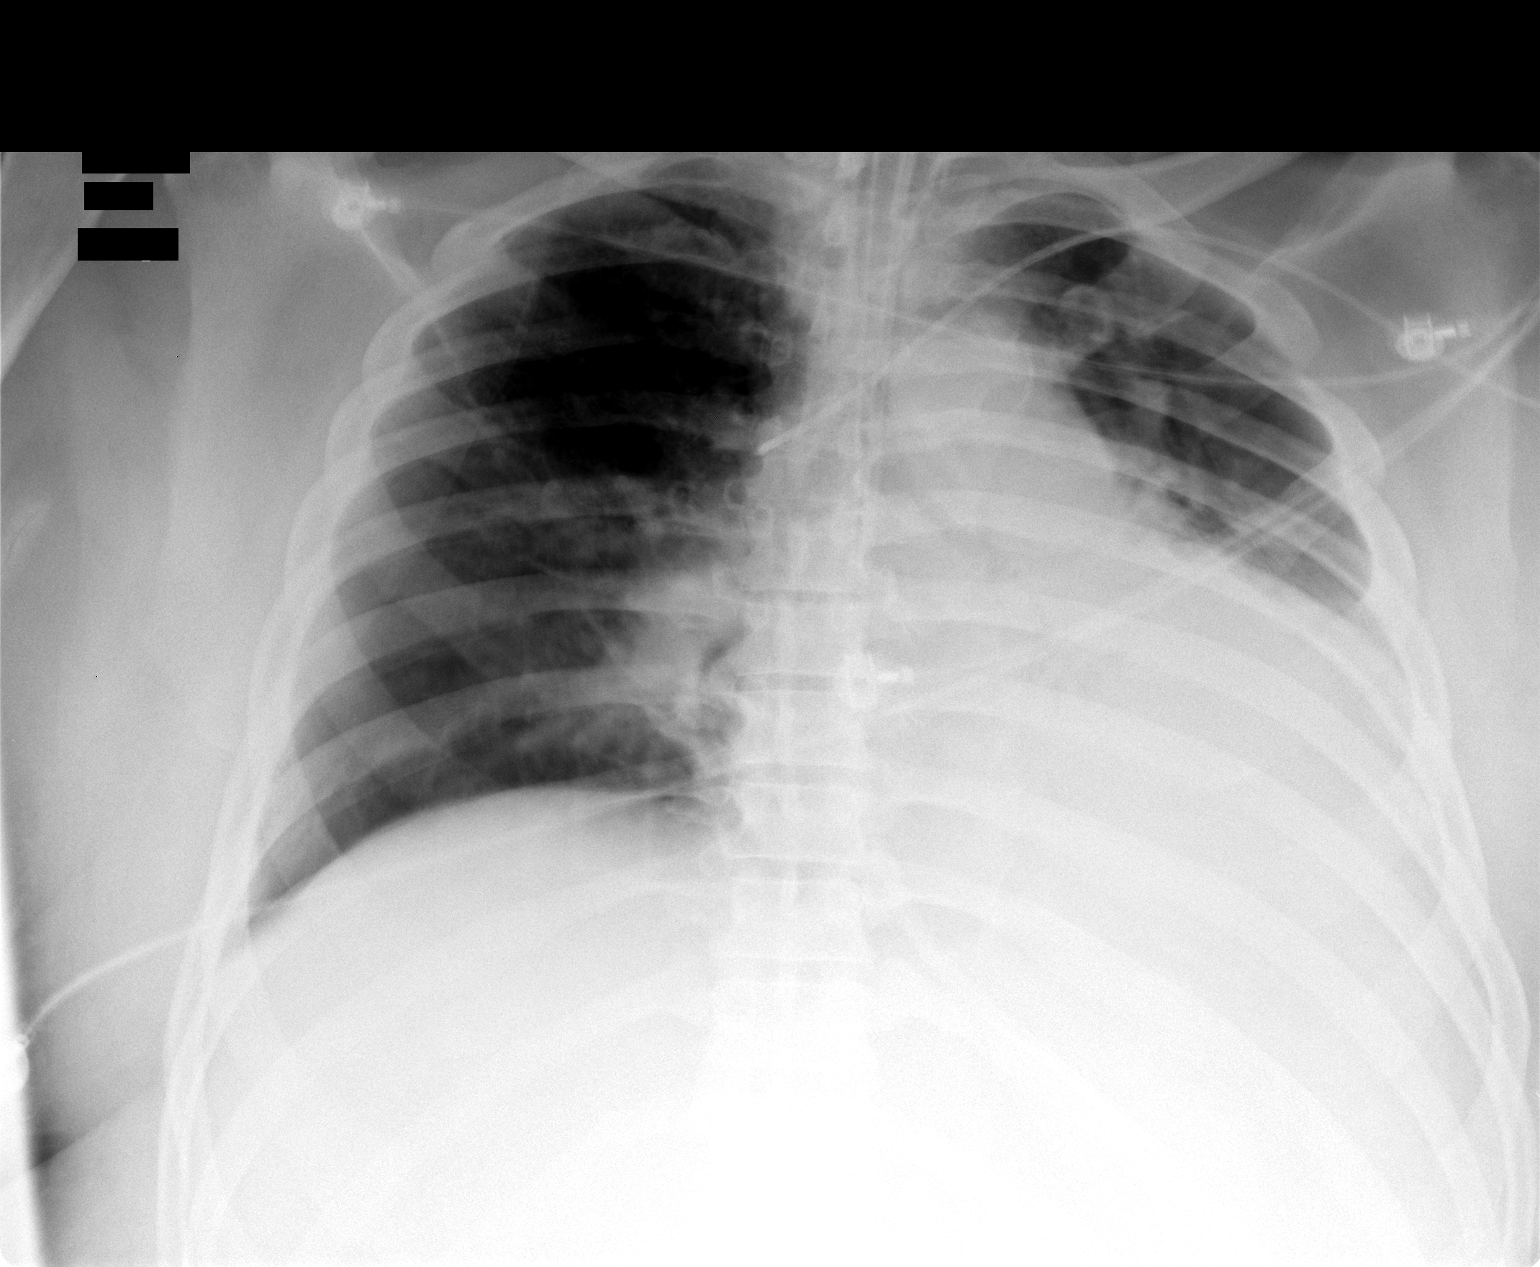

[1 of 1 positions shown; findings below may reference images not displayed]

There is interval increase in dense consolidation of the left lower lobe.  No pneumothorax is seen.  Stable positioning of endotracheal tube and central line.  Nasogastric and feeding tubes also extend into the stomach.
IMPRESSION: Interval increase in left lower lobe consolidation.  No pneumothorax.

## 2006-07-28 IMAGING — CR DG CHEST 1V PORT
1 series · 1 of 1 positions shown · non-contrast
Comparison: 11/02/04.

CLINICAL DATA: Acute renal failure.  Congestive heart failure.  Left lower lobe consolidation.
 CHEST ? 1 VIEW PORTABLE ([DATE] HOURS):

[view not recorded]
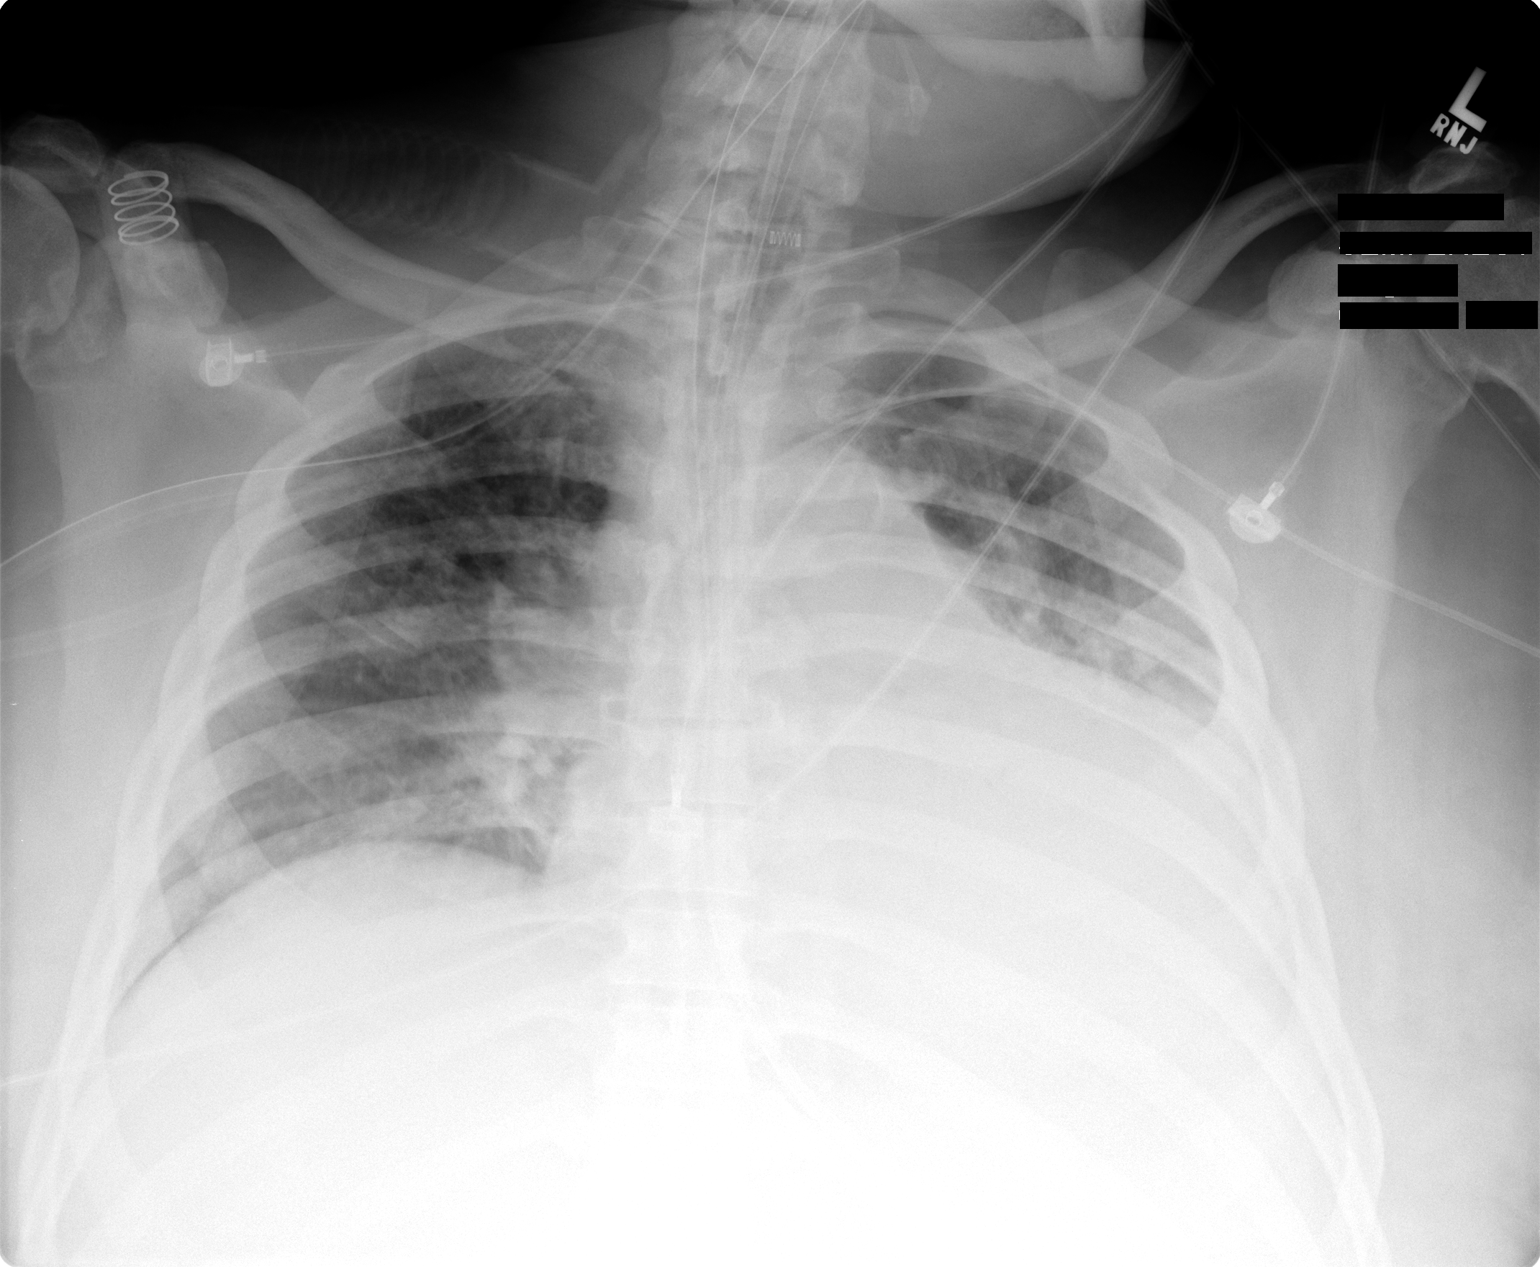

[1 of 1 positions shown; findings below may reference images not displayed]

Endotracheal tube has its tip 4 cm above the carina.  nasogastric tube and soft feeding tube enter the stomach.   Left subclavian central line has its tip in the SVC.  There is persistent left lower lobe collapse and/or consolidation.  There is more diffuse lung density elsewhere than was seen yesterday.
IMPRESSION: 1.  Lines and tubes in satisfactory position. 
 2.  Increase in diffuse lung density.
 3.  Persistent consolidation of the left lower lobe.

## 2006-07-28 IMAGING — CT CT PELVIS W/O CM
1 of 2 series · 15 of 32 positions shown, 19 images · non-contrast
Comparison: 10/21/04.

CLINICAL DATA: Recent wound dehiscence/fever.
TECHNIQUE: Routine multidetector helical imaging with oral contrast ? no IV contrast used (renal failure).

[Series 3: abd/pelvis 5.0 b10f · axial · 0.94mm/px · z∈[-484,-14]mm · 15 of 104 slices shown, 19 images]
[im 5/104  soft-tissue]
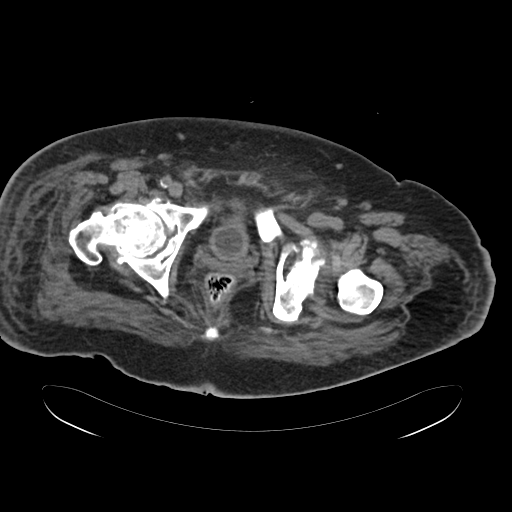
[im 5/104  bone]
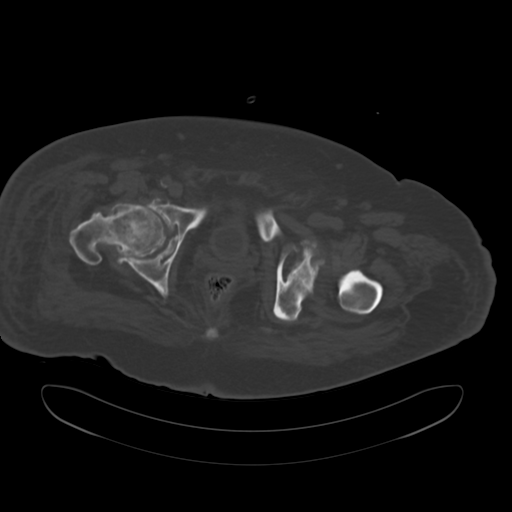
[im 13/104  soft-tissue]
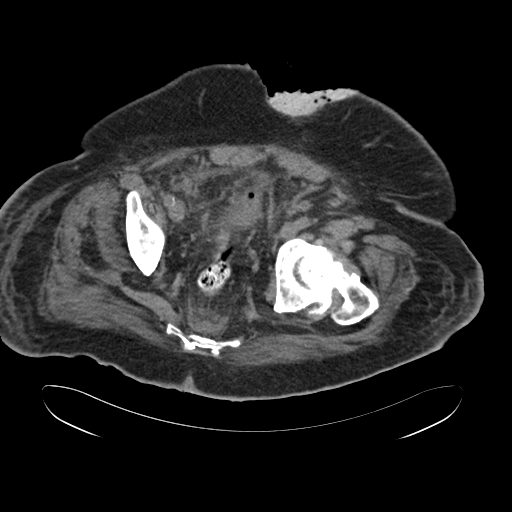
[im 21/104  soft-tissue]
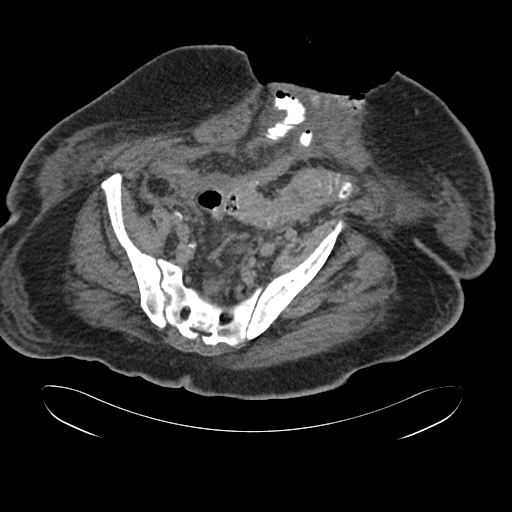
[im 29/104  soft-tissue]
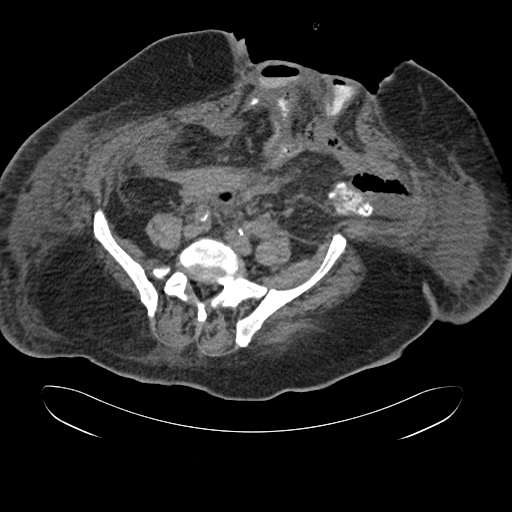
[im 38/104  soft-tissue]
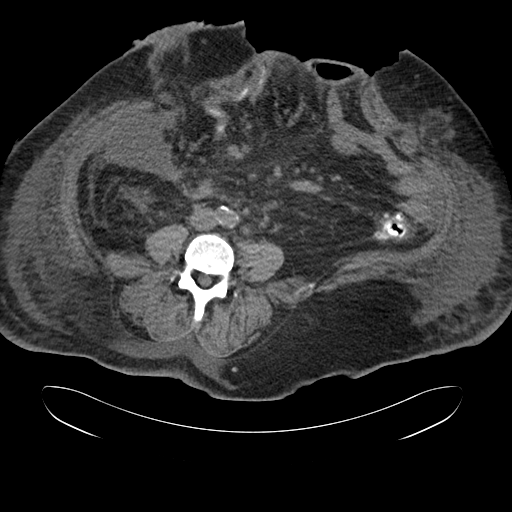
[im 46/104  soft-tissue]
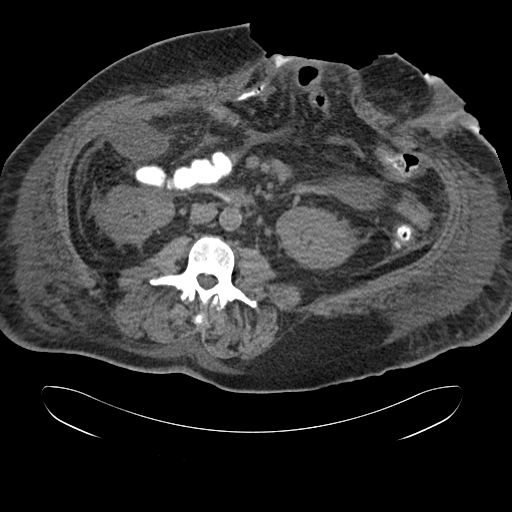
[im 54/104  soft-tissue]
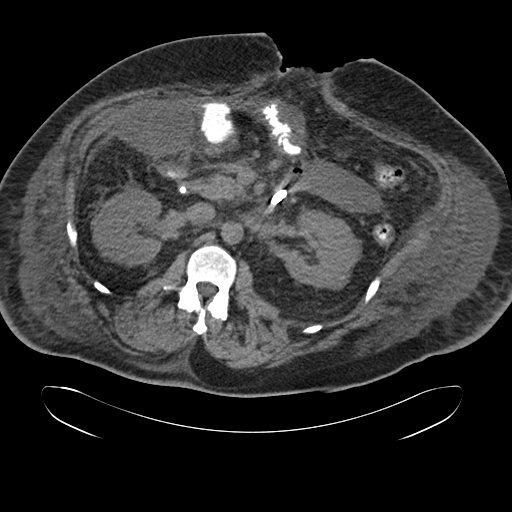
[im 58/104  soft-tissue]
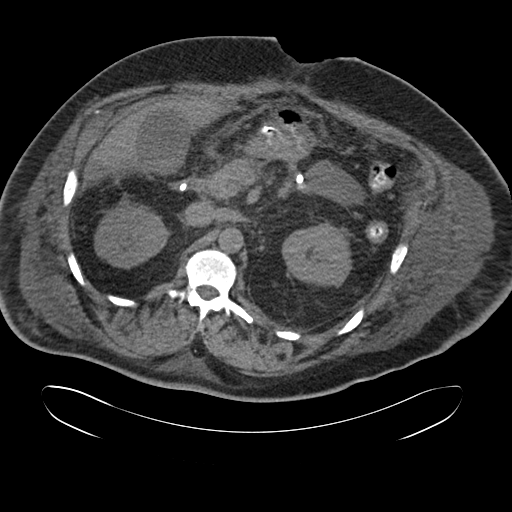
[im 66/104  soft-tissue]
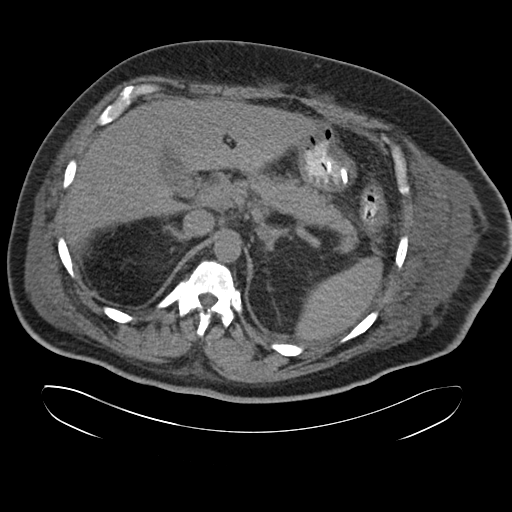
[im 66/104  bone]
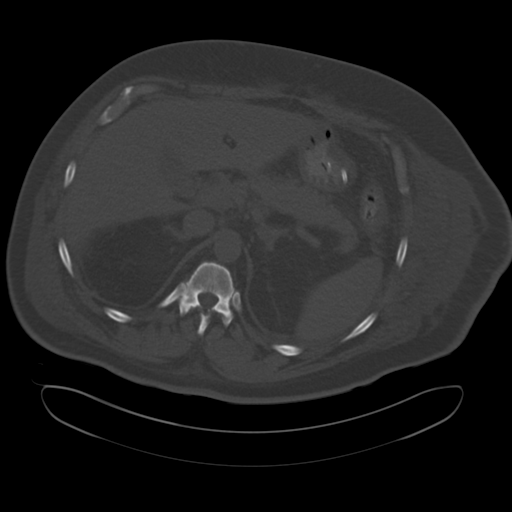
[im 75/104  soft-tissue]
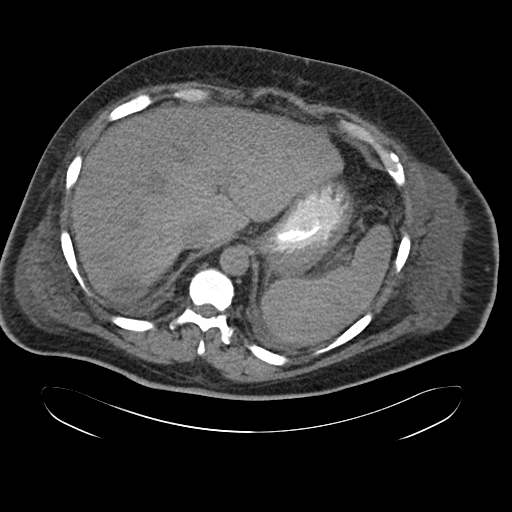
[im 83/104  soft-tissue]
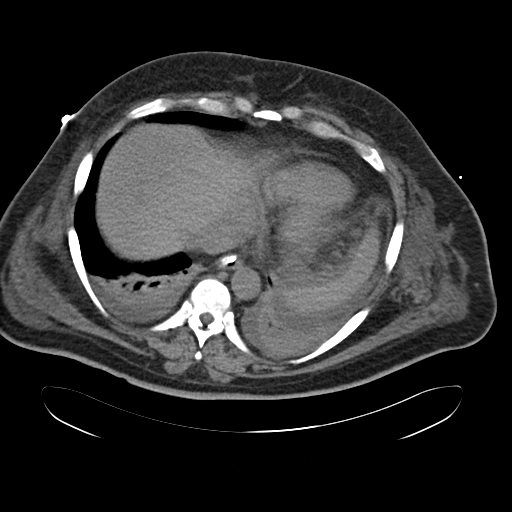
[im 87/104  lung]
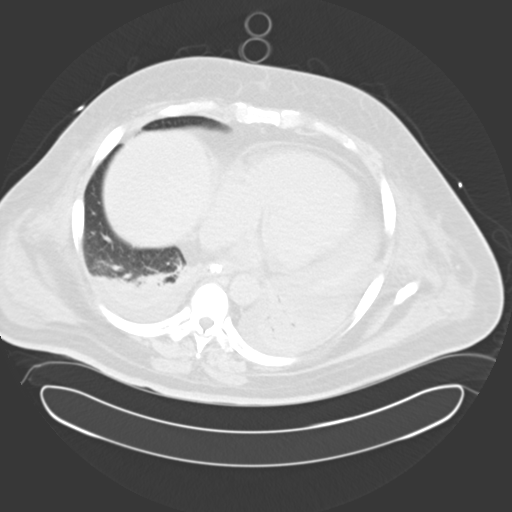
[im 91/104  soft-tissue]
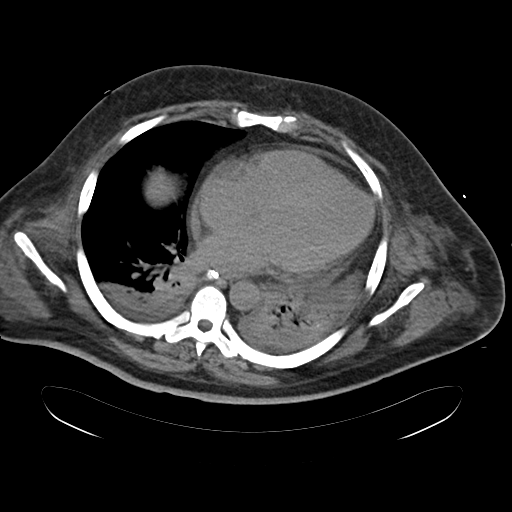
[im 91/104  lung]
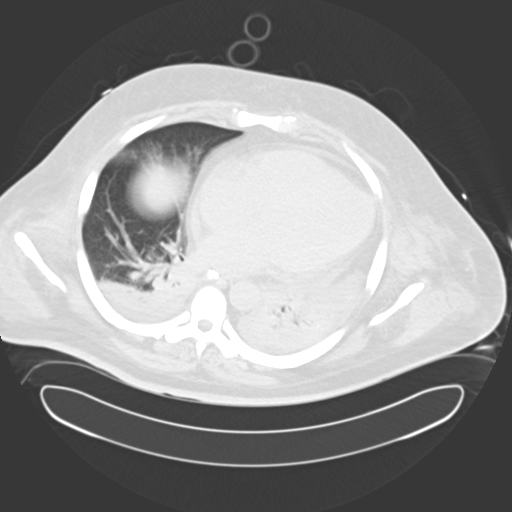
[im 95/104  lung]
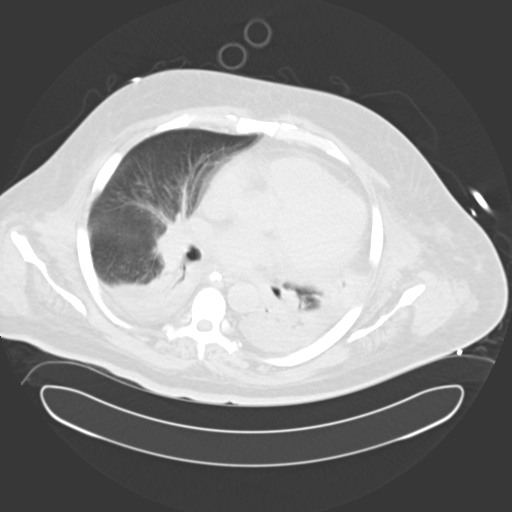
[im 99/104  soft-tissue]
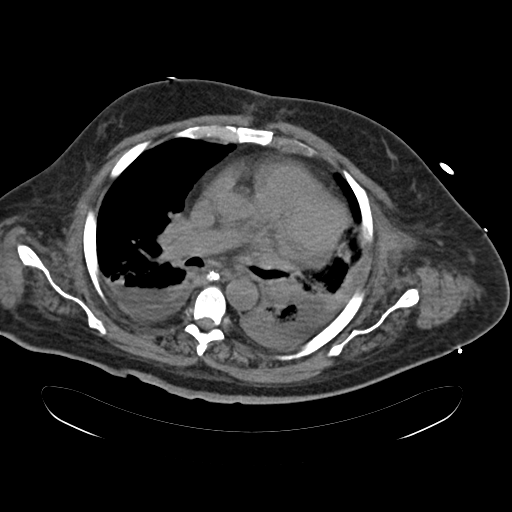
[im 99/104  lung]
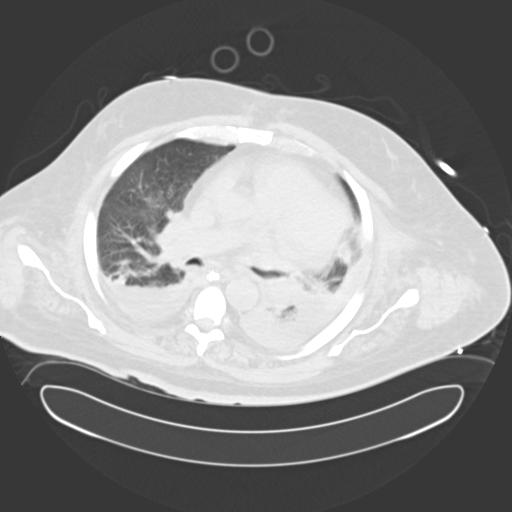

[15 of 32 positions shown; findings below may reference images not displayed]

CT ABDOMEN WITHOUT CONTRAST:
Lung bases show bilateral lower lobe atelectasis or atelectatic pneumonia with pleural effusions.  
In the posterior aspect of the right lobe of the liver, there is an ill defined low attenuation area measuring approximately 4 cm in long axis by about 3 cm in short axis.  This was not present on the prior scan.  It is suspicious for an early liver abscess.  No other obvious fluid collections or masses.  The gallbladder is moderately distended.  There is an open midline abdominal wound with an ostomy in the right abdomen.  There are numerous unopacified small bowel loops.   I cannot see a definitive abscess.
IMPRESSION: 1.  Bilateral lower lobe atelectasis or atelectatic pneumonia with pleural effusions.  
2.  Small low attenuation lesion in the posterior liver which is a new finding ? possible early abscess. 
3.  No other definite collections.
4.  Open midline abdominal wound.  
CT PELVIS WITHOUT CONTRAST:
The distended gallbladder extends down fairly low into the upper abdomen.  However, contiguous with the fundus of the gallbladder, there appears to be a separate fluid collection that begins on image number 50, and extends down into the upper right pelvis, ending on image number 77.  It is also adjacent to the ostomy site.  This is likely an abscess.  It measures approximately 6 cm in greatest transverse dimension by about 5 cm in greatest AP dimension, and extends over a length of about 13 cm.  
There is high density material in the dependent portion of the open wound, that is presumed to be gauze and/or packing.  This does not look as dense as the contrast in the adjacent small bowel loops and so I do not think it is extravasation.  However that is a possibility.  Is the wound clinically leaking  bowel contents?
IMPRESSION: Fluid collection in the right upper pelvis as described above, consistent with an abscess.

## 2006-07-29 IMAGING — CR DG CHEST 1V PORT
1 series · 1 of 1 positions shown · non-contrast
Comparison: 11/03/04.

CLINICAL DATA: Respiratory failure ? on ventilator.  
 PORTABLE CHEST - 1 VIEW:

[view not recorded]
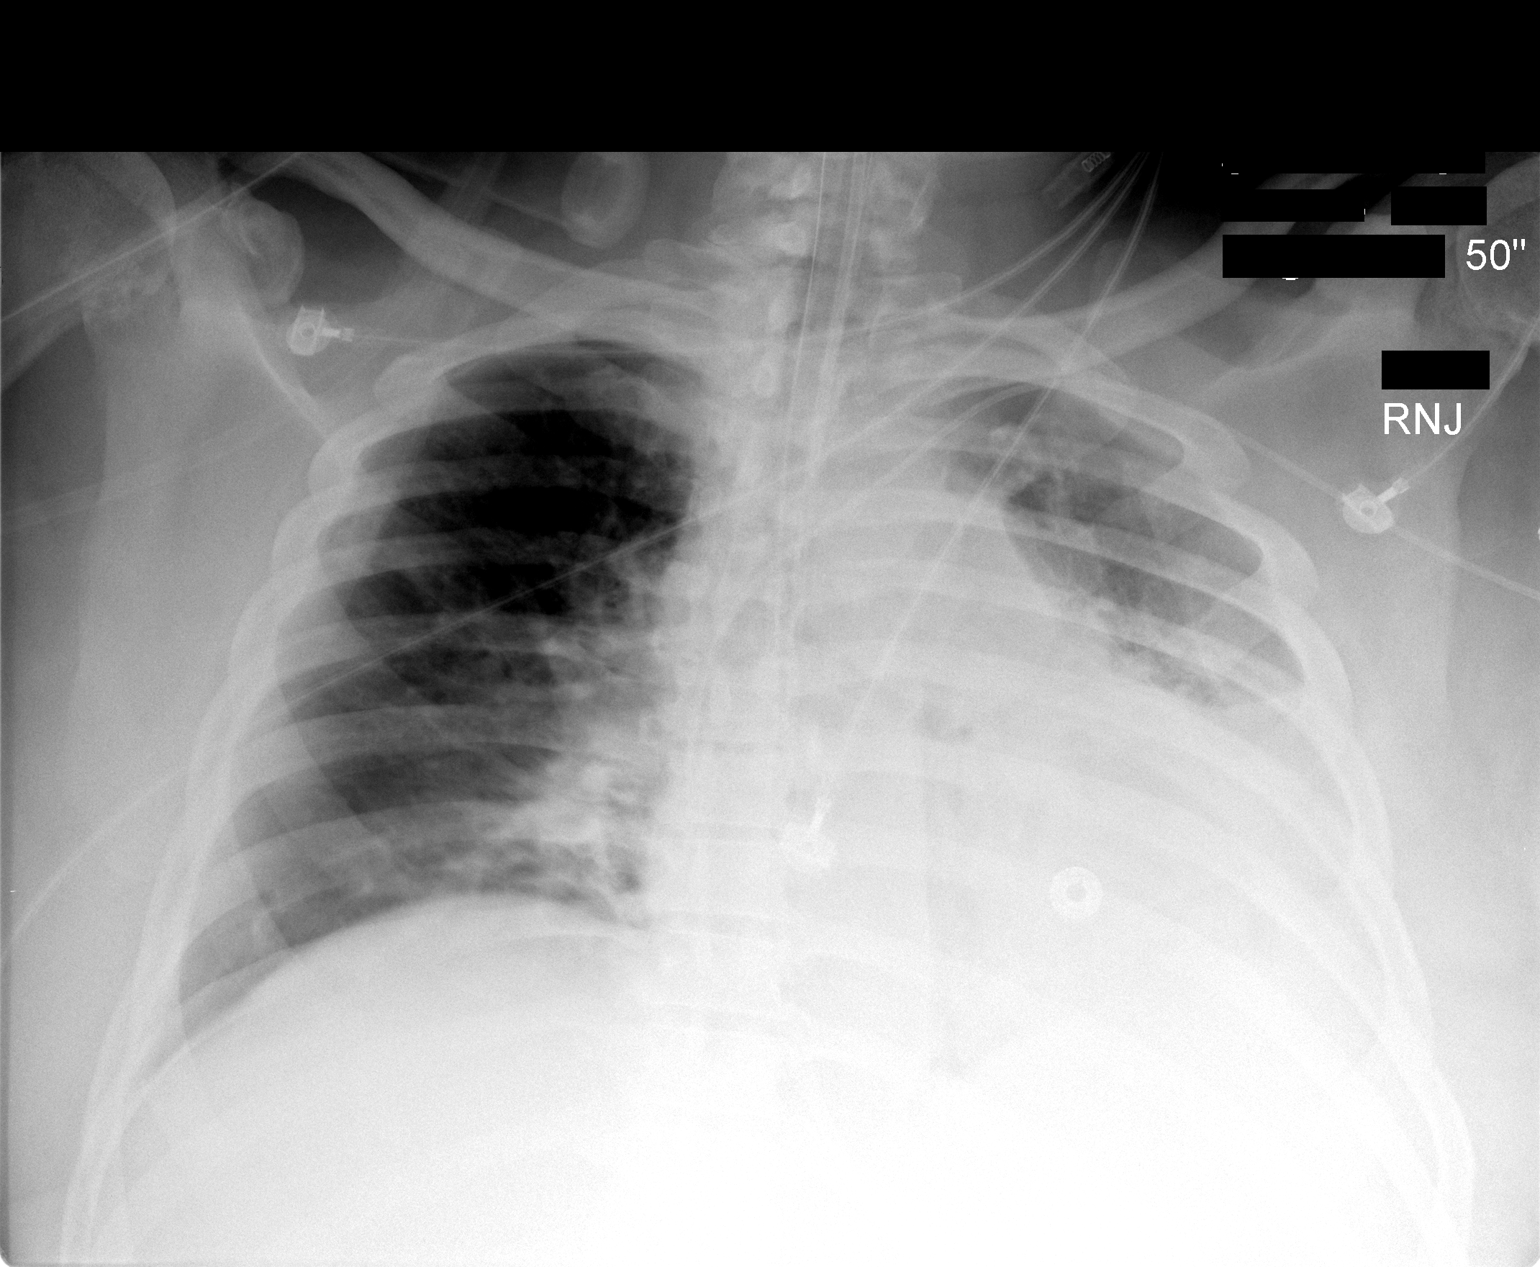

[1 of 1 positions shown; findings below may reference images not displayed]

FINDINGS: Portions of the medial left diaphragm are now visualized indicating some improvement in left lower lobe aeration.  Little change overall.  The heart is enlarged without definite failure.
IMPRESSION: Left lower lobe aeration slightly improved.

## 2006-07-29 IMAGING — CR DG CHEST 1V PORT
1 series · 1 of 1 positions shown · non-contrast
Comparison: none

CLINICAL DATA: Acute renal failure.  Patient has undergone tracheostomy.
PORTABLE CHEST, ONE VIEW ? 11/04/2004 ? (8006 HOURS):

[view not recorded]
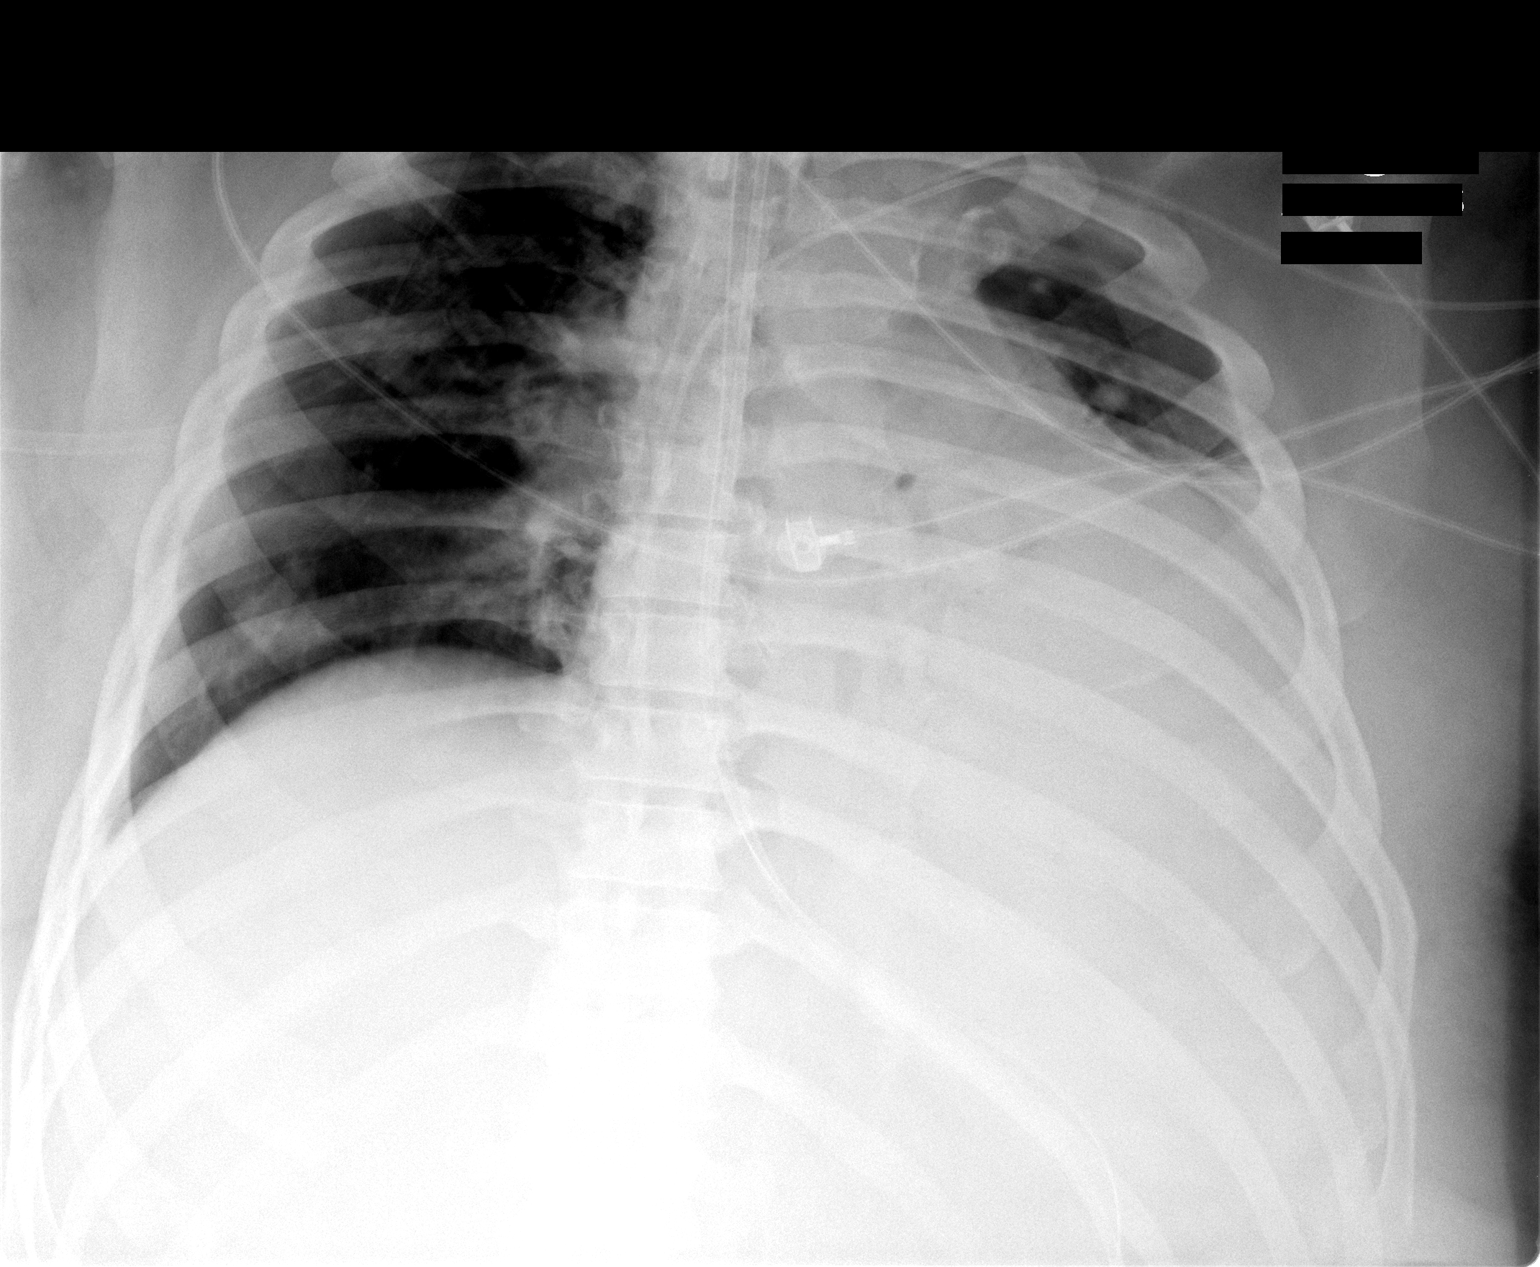

[1 of 1 positions shown; findings below may reference images not displayed]

FINDINGS: The endotracheal tubes at the thoracic inlet.  PICC catheter tip is at the SVC-right atrial junction.  NG tube extends in the stomach.  Lingular and left lower lobe collapse without change.
IMPRESSION: Tracheostomy in satisfactory position.

## 2006-07-30 IMAGING — CT CT ABCESS DRAINAGE
1 of 2 series · 27 of 30 positions shown, 35 images · non-contrast
Comparison: none

CLINICAL DATA: Acute renal failure. Wound dehiscence. Recent CT revealed right
lateral peritoneal fluid collection suggestive of abscess.

[Series 4: carevisionct · axial · 1.48mm/px · z∈[-300,-258]mm · 27 of 29 slices shown, 35 images]
[im 2/29  soft-tissue]
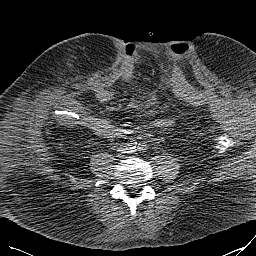
[im 2/29  bone]
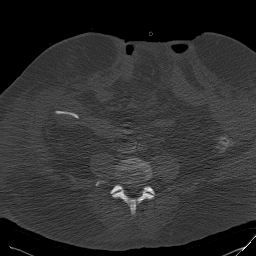
[im 3/29  soft-tissue]
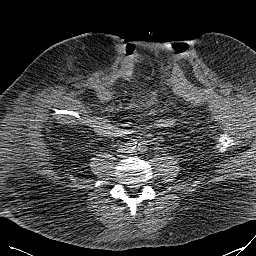
[im 4/29  soft-tissue]
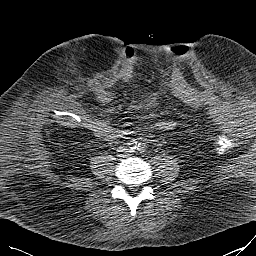
[im 5/29  soft-tissue]
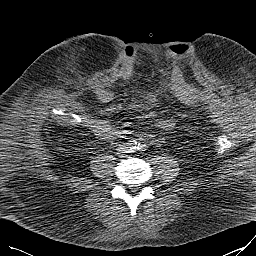
[im 6/29  soft-tissue]
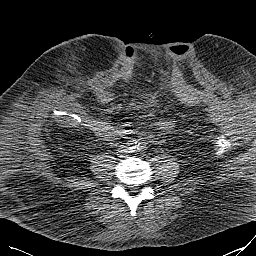
[im 7/29  soft-tissue]
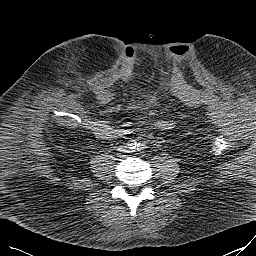
[im 8/29  soft-tissue]
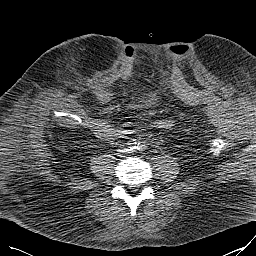
[im 9/29  soft-tissue]
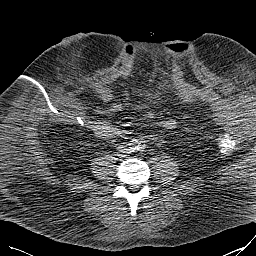
[im 10/29  soft-tissue]
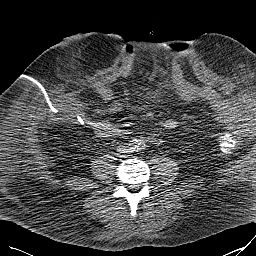
[im 10/29  bone]
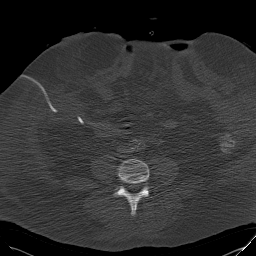
[im 11/29  soft-tissue]
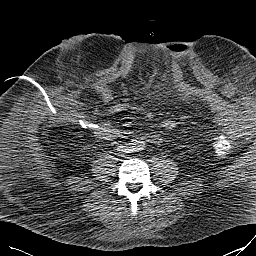
[im 12/29  soft-tissue]
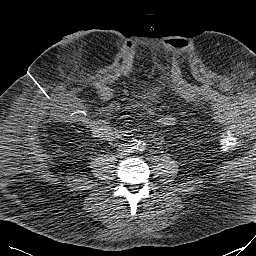
[im 13/29  soft-tissue]
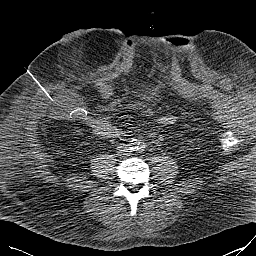
[im 14/29  soft-tissue]
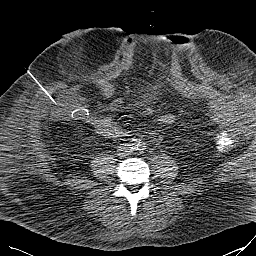
[im 15/29  soft-tissue]
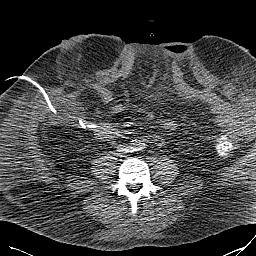
[im 16/29  soft-tissue]
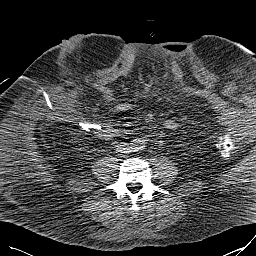
[im 17/29  soft-tissue]
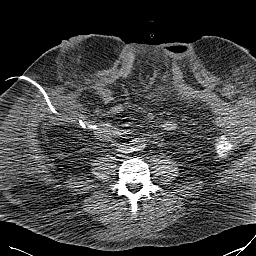
[im 18/29  soft-tissue]
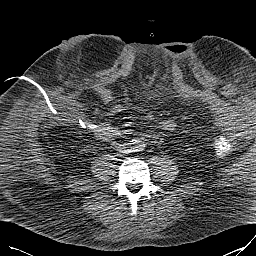
[im 18/29  bone]
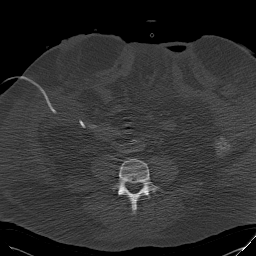
[im 19/29  soft-tissue]
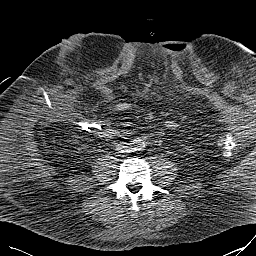
[im 20/29  soft-tissue]
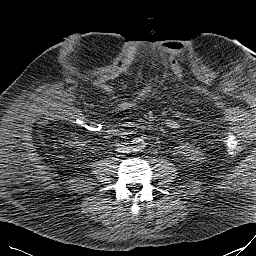
[im 21/29  soft-tissue]
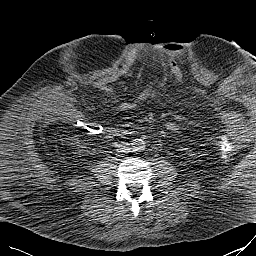
[im 22/29  soft-tissue]
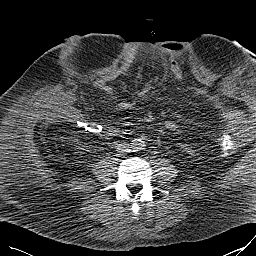
[im 23/29  soft-tissue]
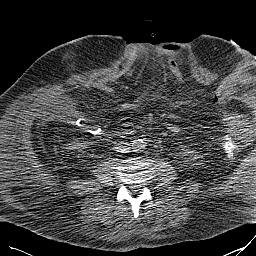
[im 24/29  soft-tissue]
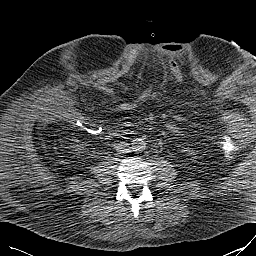
[im 25/29  soft-tissue]
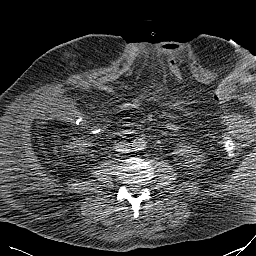
[im 25/29  lung]
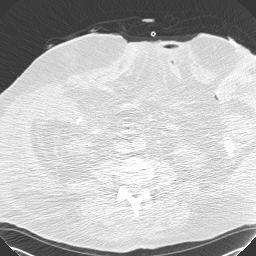
[im 26/29  soft-tissue]
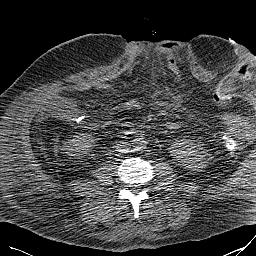
[im 26/29  lung]
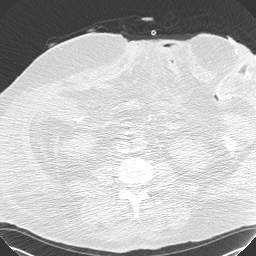
[im 26/29  bone]
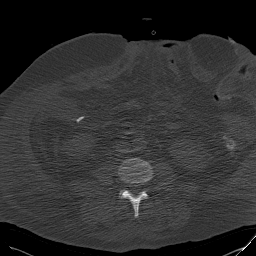
[im 27/29  soft-tissue]
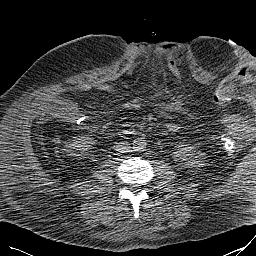
[im 27/29  lung]
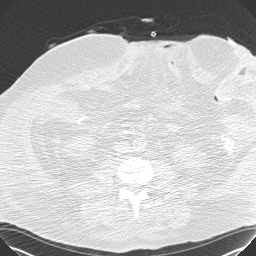
[im 28/29  soft-tissue]
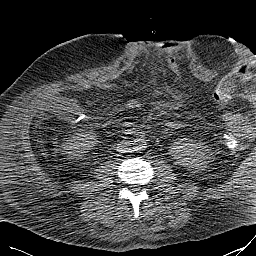
[im 28/29  lung]
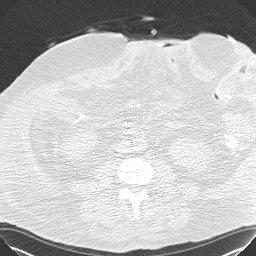

[27 of 30 positions shown; findings below may reference images not displayed]

CT-guided abscess drain catheter placement:

An appropriate skin site was determined with limited axial scans through the
abdomen. Skin site was marked, prepped with Betadine, and draped in usual
sterile fashion, and infiltrated locally with 1% lidocaine. A 19 gauge
percutaneous entry needle was advanced into the collection. Cloudy
serosanguineous fluid returned. Amplatz wire was advanced into the collection
and its position confirmed on CT. The tract was dilated with a 10 French
vascular dilator to allow placement of a 10.2 French pigtail drain catheter,
placed centrally within the collection. The catheter position was confirmed on
CT. Approximate 30 mL of Bella fluid with some debris was aspirated, a
sample sent for gram stain, culture and sensitivity. The catheter was sutured
externally with a 0 silk   suture and placed to external gravity drain bag. No
immediate complication.
IMPRESSION: 1. Technically successful CT-guided right peritoneal abscess drain catheter
placement

## 2006-07-30 IMAGING — CR DG CHEST 1V PORT
1 series · 1 of 1 positions shown · non-contrast
Comparison: 11/04/04.

CLINICAL DATA: Acute renal failure.  Congestive heart failure. 
 CHEST PORTABLE ? 1 VIEW:

[view not recorded]
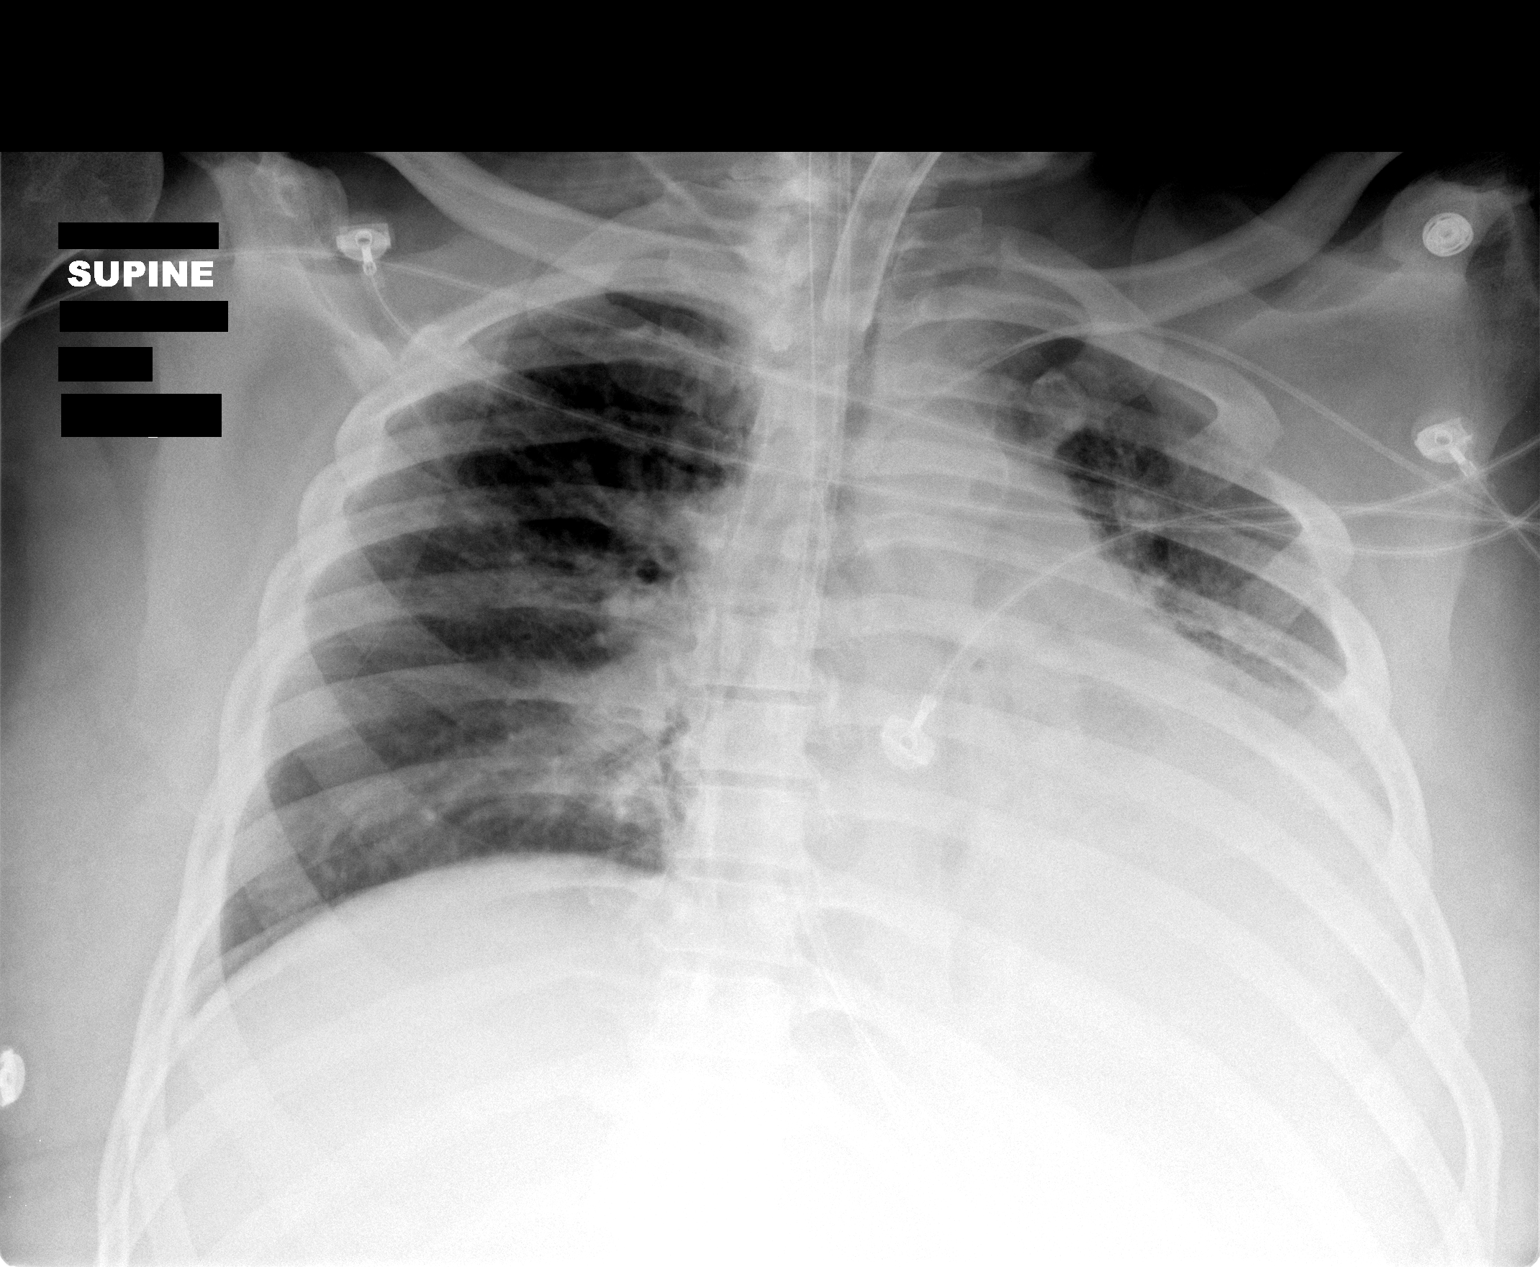

[1 of 1 positions shown; findings below may reference images not displayed]

FINDINGS: Tracheostomy tube appears satisfactorily positioned.  Nasogastric and feeding tubes extend into the stomach and beyond the inferior margin of the image.  
 There is persistent retrocardiac airspace opacity obscuring the left hemidiaphragm.  Underlying interstitial opacity is present in the lungs.  Left central line is in place, although its distal tip is difficult to visualize due to leftward rotation.  It is at least at the brachiocephalic confluence.
IMPRESSION: Stable radiographic appearance of the chest.

## 2006-07-31 IMAGING — CR DG CHEST 1V PORT
1 series · 1 of 1 positions shown · non-contrast
Comparison: 11/05/04.

CLINICAL DATA: Sepsis.  Respiratory failure and renal failure.
 PORTABLE SINGLE VIEW CHEST - 11/06/04 AT 6466 HOURS:

[view not recorded]
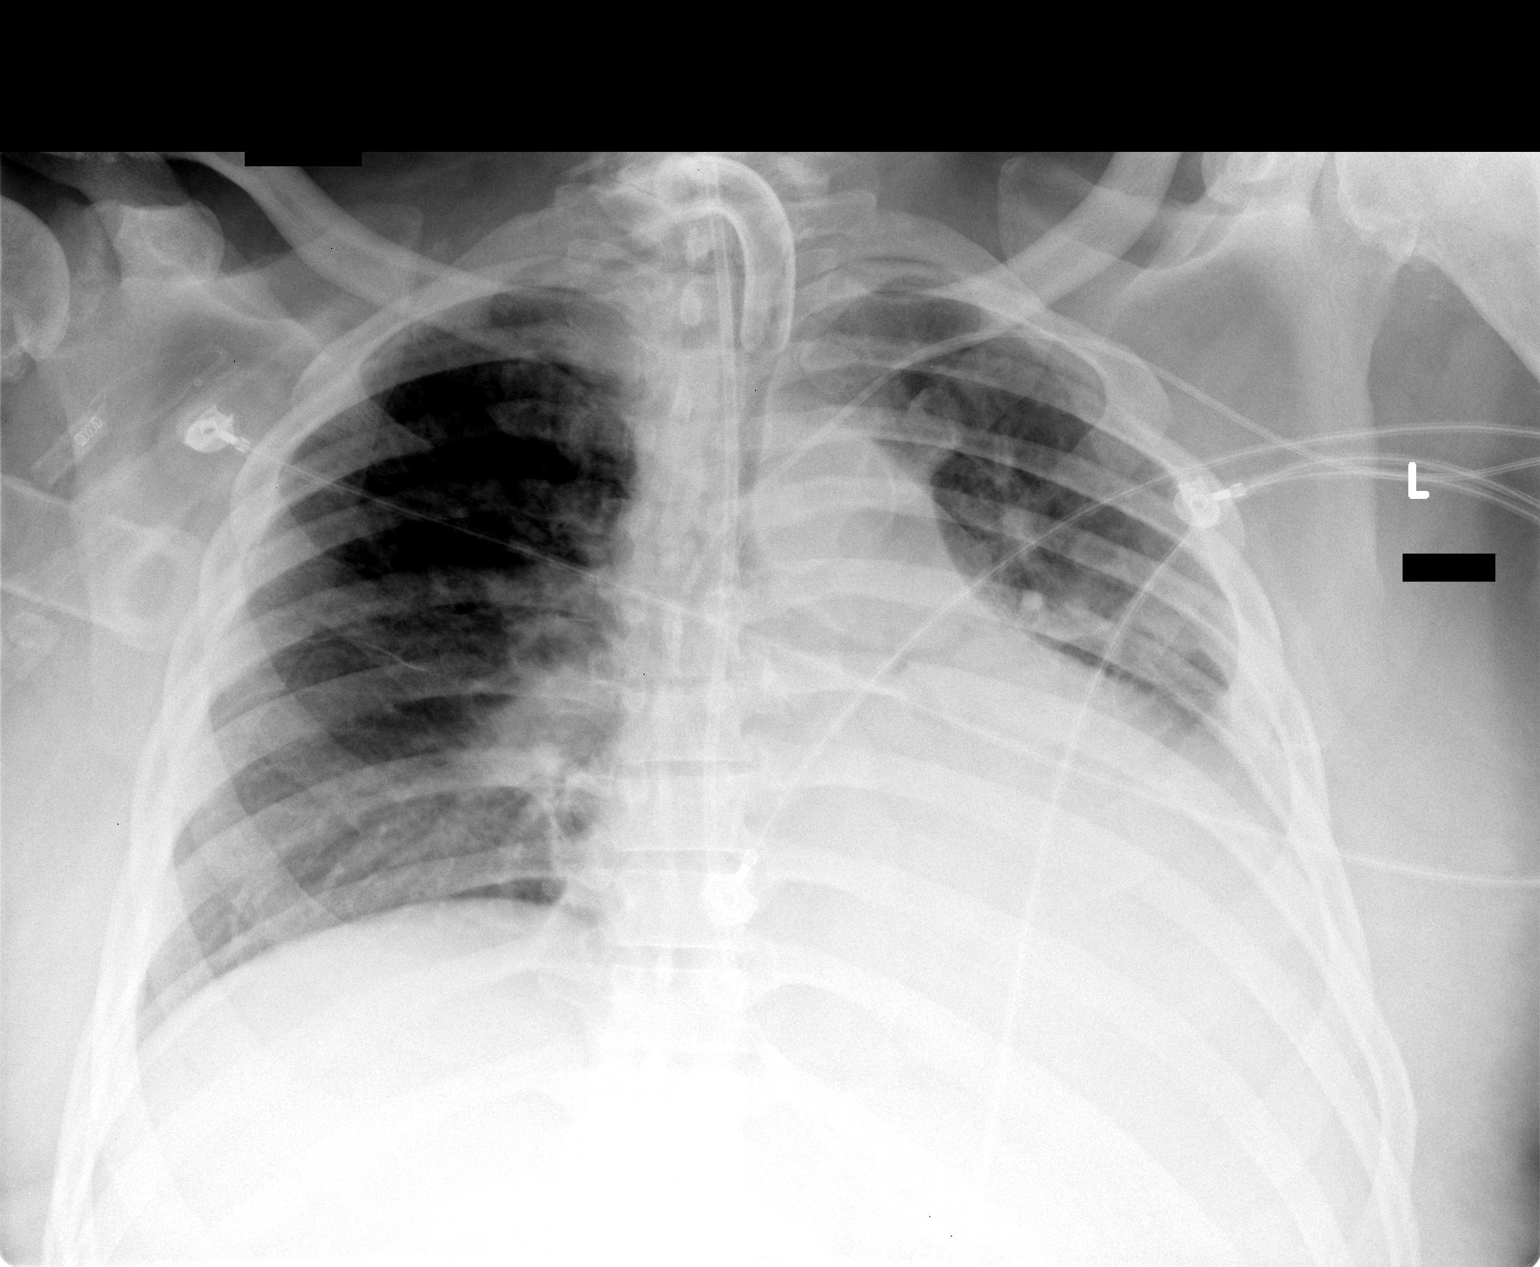

[1 of 1 positions shown; findings below may reference images not displayed]

There remains dense consolidation of the left lower lobe which is stable.  Stable positioning of the tracheostomy tube.
IMPRESSION: Stable left lower lobe consolidation.

## 2006-08-02 IMAGING — CT CT PELVIS W/O CM
1 of 4 series · 14 of 32 positions shown, 18 images · non-contrast
Comparison: none

CLINICAL DATA: Acute renal failure. Decreased output from right lower abdominal
abscess drainage catheter. Cultures negative.

[Series 3: abd/pelvis 5.0 b10f · axial · 0.97mm/px · z∈[-493,-13]mm · 14 of 106 slices shown, 18 images]
[im 5/106  soft-tissue]
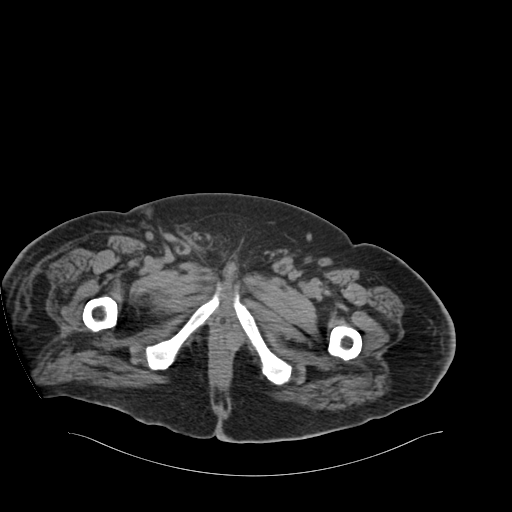
[im 5/106  bone]
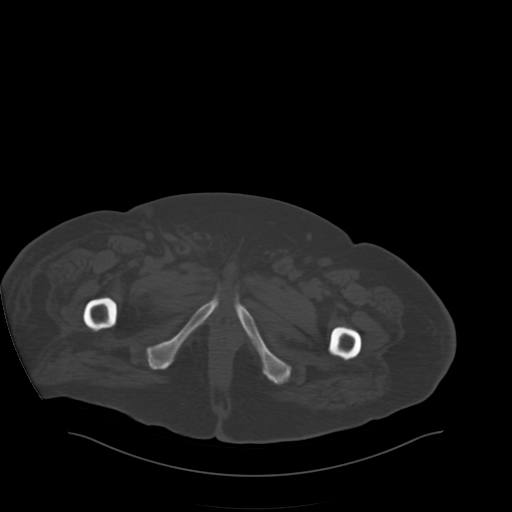
[im 14/106  soft-tissue]
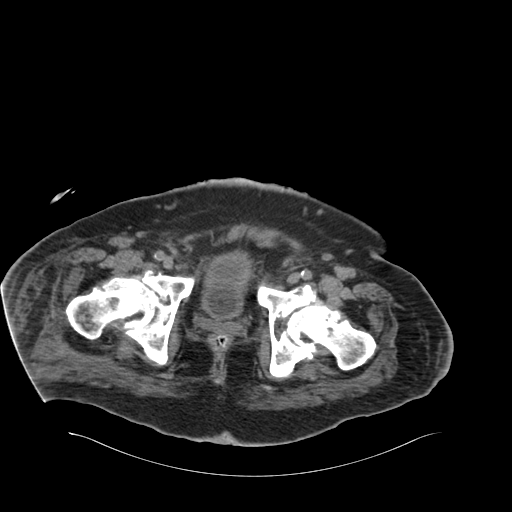
[im 22/106  soft-tissue]
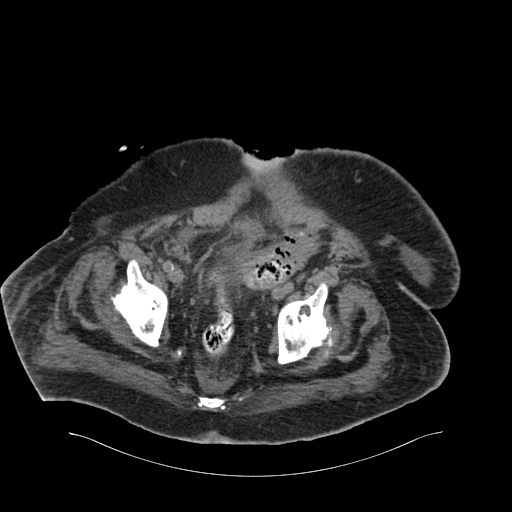
[im 31/106  soft-tissue]
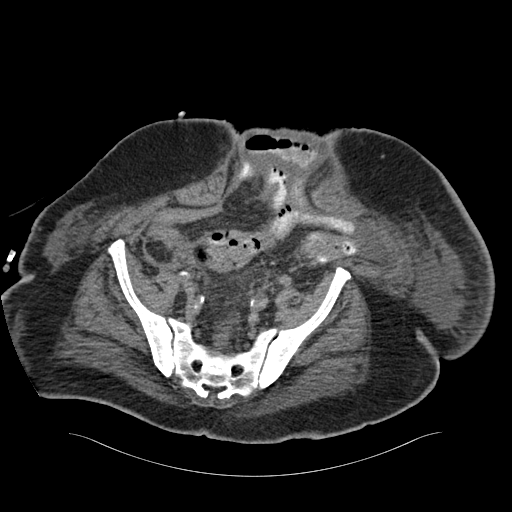
[im 40/106  soft-tissue]
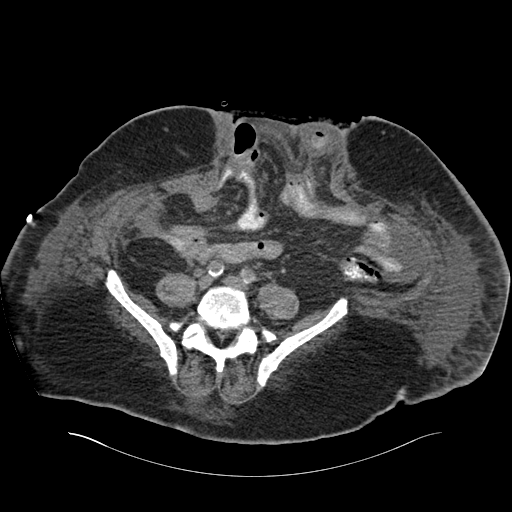
[im 49/106  soft-tissue]
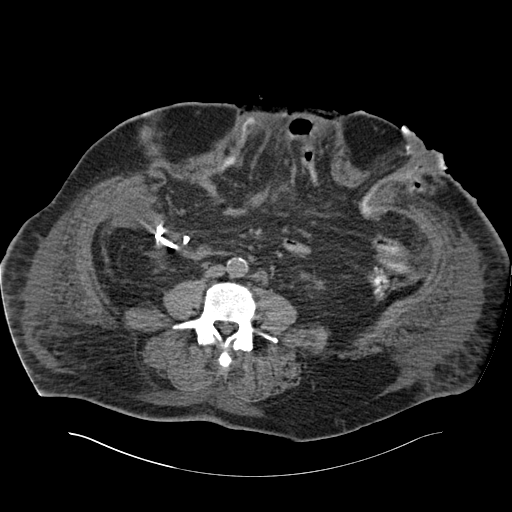
[im 57/106  soft-tissue]
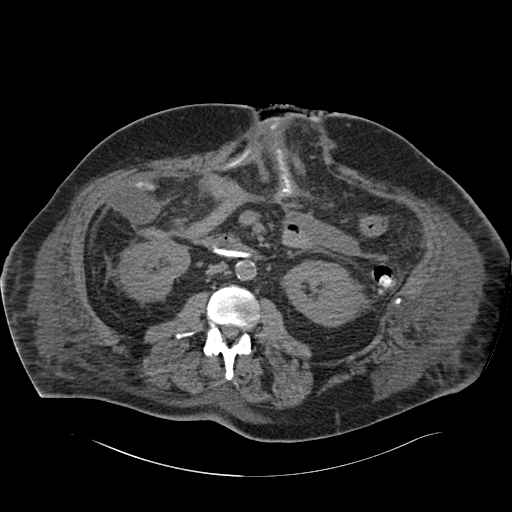
[im 66/106  soft-tissue]
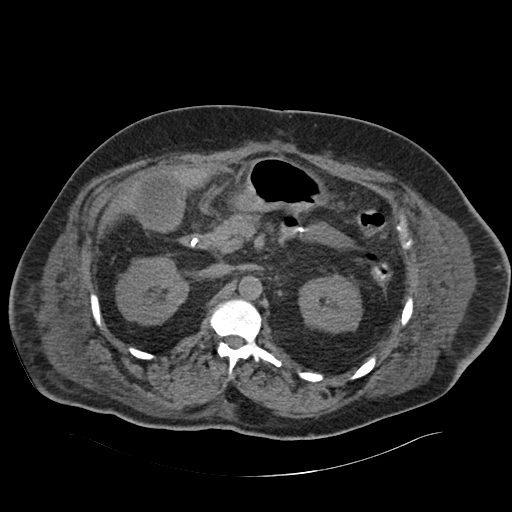
[im 75/106  soft-tissue]
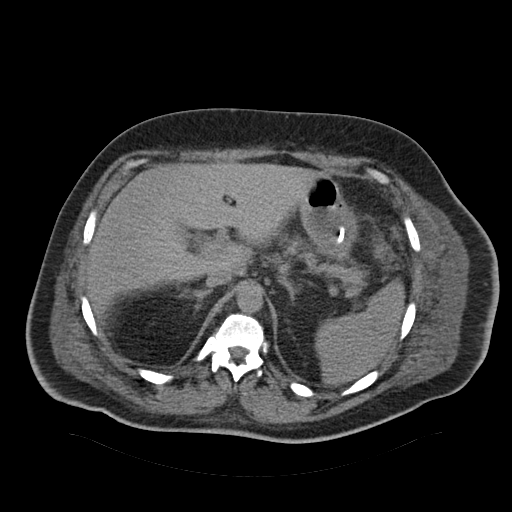
[im 75/106  bone]
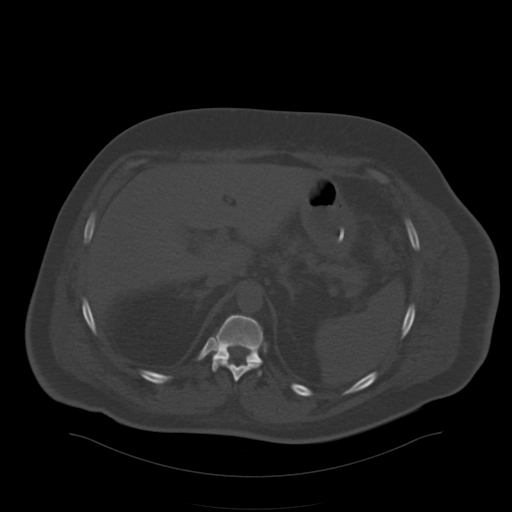
[im 84/106  soft-tissue]
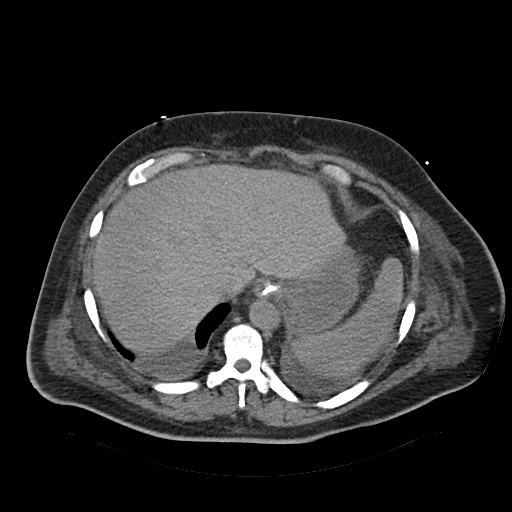
[im 88/106  lung]
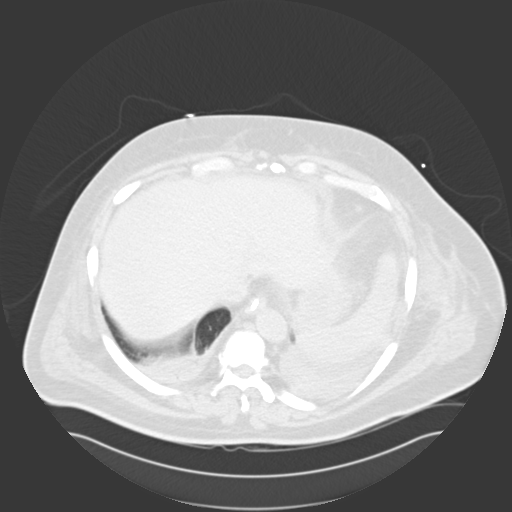
[im 92/106  soft-tissue]
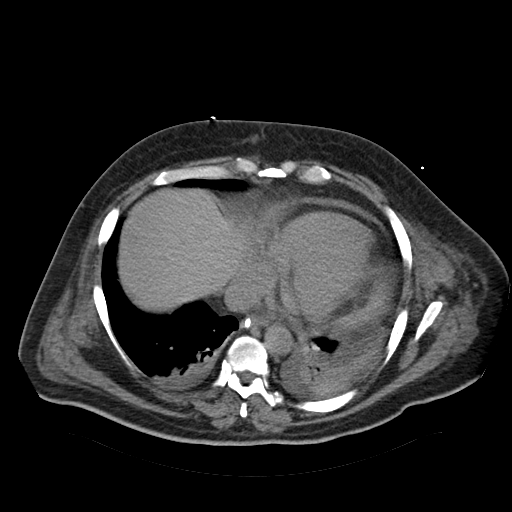
[im 92/106  lung]
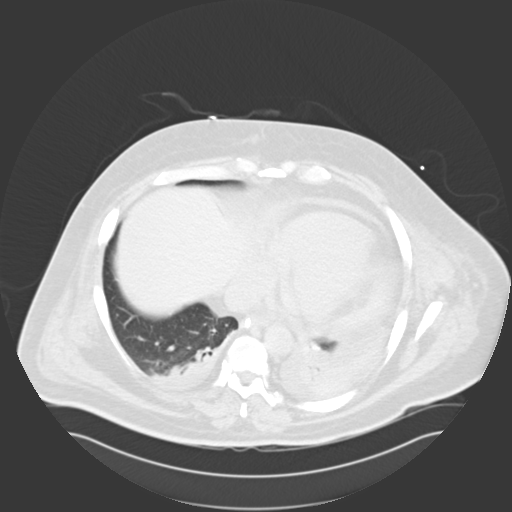
[im 97/106  lung]
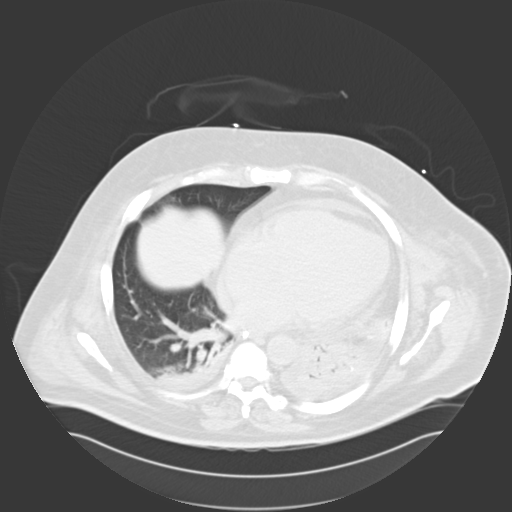
[im 101/106  soft-tissue]
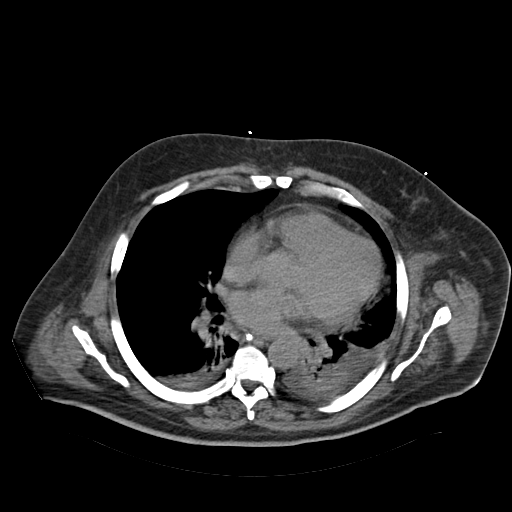
[im 101/106  lung]
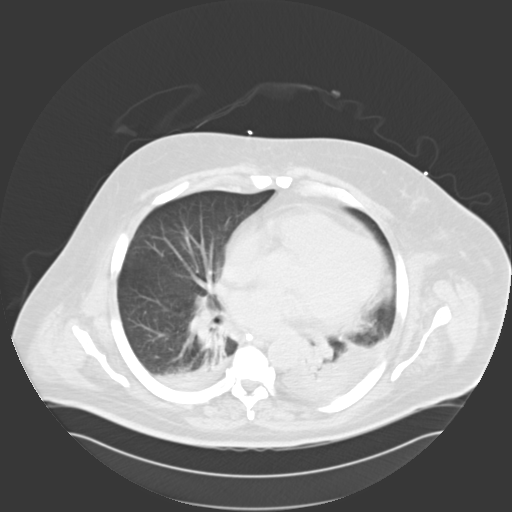

[14 of 32 positions shown; findings below may reference images not displayed]

CT ABDOMEN WITHOUT CONTRAST:

Comparison 11/03/2004. Small bilateral pleural effusions and pericardial
effusion. Dependent atelectasis/consolidation in the posterior left lower lobe
as before. Some decrease in the posterior right lower lobe atelectasis. Feeding
tube remains in the distal duodenum at the ligament of Treitz. Unremarkable
noncontrast evaluation of liver, spleen, adrenal glands, kidneys, pancreas.
Little change in crescentic low attenuation region at the posterior aspect of
the right hepatic lobe. Gallbladder appears somewhat thick walled. There is a
persistent loculated right pericolic gutter collection which has decreased in
size since previous study, now measuring approximately 2.5 x 4.6 cm in maximum
transverse dimensions. The percutaneous drain catheter is at the medial inferior
margin of the collection. Midline open abdominal wound persists. Colostomy in
the right abdomen and mucous fistula on the left. Small bowel remains
decompressed. Atheromatous abdominal aorta without aneurysm. No new
intra-abdominal fluid collections.
IMPRESSION: 1. Partial resolution of right lateral peritoneal loculated fluid collection,
with drain catheter now eccentrically positioned. This will be repositioned.

CT PELVIS WITHOUT CONTRAST:

There is a small amount of fluid in the far lateral aspect of the lower
peritoneum on the left at the level of the ileum, which is slightly more
conspicuous on the previous study with better oral contrast in the small bowel
loops. Colon decompressed with postoperative changes as above . Urinary bladder
decompressed by Foley catheter. No free fluid. Open anterior midline wound is
again noted. Degenerative changes in both hips.
IMPRESSION: 1. Slight increase in the far lateral fluid collection in the left lower
peritoneum.

ABSCESS CATHETER REPOSITIONING UNDER CT:

The previously placed right lateral peritoneal drain catheter was partially
withdrawn. Followup CT confirmed the proximal side holes spanning the residual
fluid collection. Catheter was flushed and returned to external drainage.
Catheter was secured externally with Statlock.
IMPRESSION: 1. Technically successful repositioning of right lateral peritoneal drain
catheter placement under CT

## 2006-08-02 IMAGING — CR DG CHEST 1V PORT
1 series · 1 of 1 positions shown · non-contrast
Comparison: 11/06/2004

CLINICAL DATA: Left lower lobe consolidation, CHF, ventilator

CHEST - 2 VIEW:

[view not recorded]
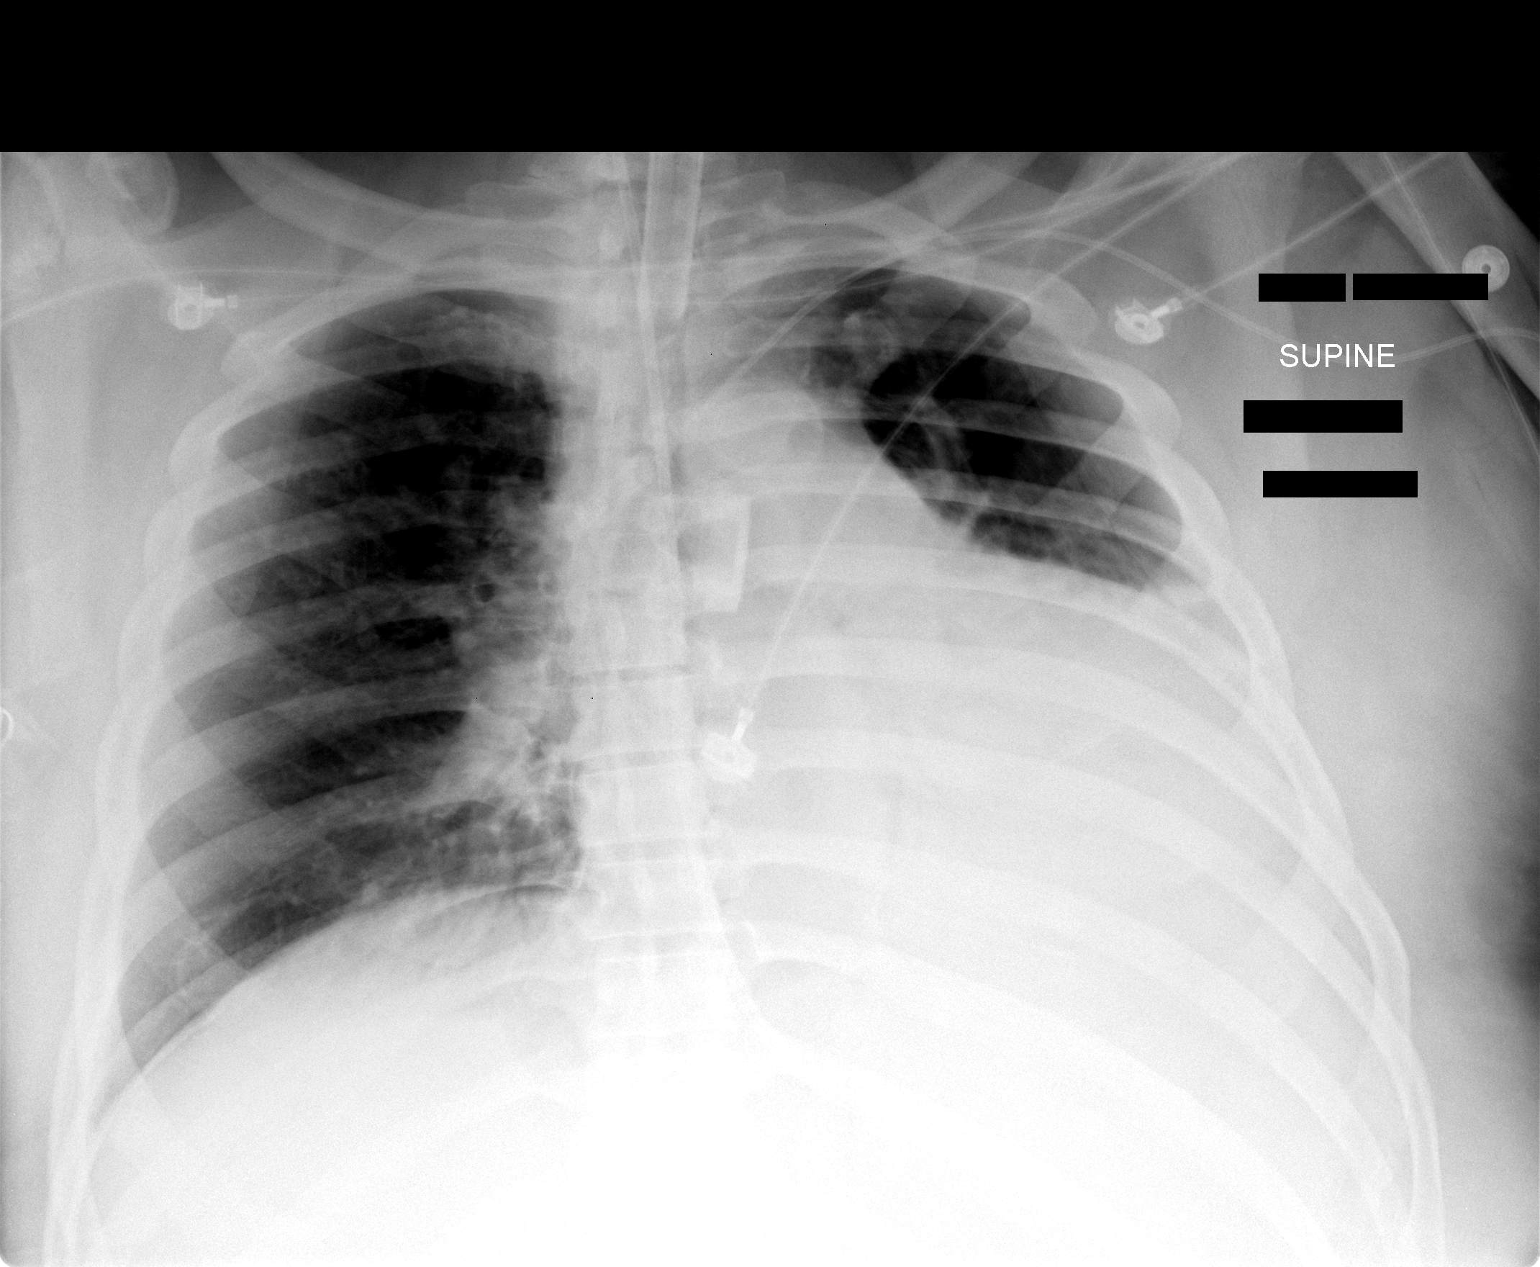

[1 of 1 positions shown; findings below may reference images not displayed]

FINDINGS: Support devices are stable. Continued dense consolidation in the left
lower lobe. Stable cardiomegaly and vascular congestion.
IMPRESSION: No change dense consolidation left lower lobe.

 portable

## 2006-08-04 IMAGING — CR DG CHEST 1V PORT
1 series · 1 of 1 positions shown · non-contrast
Comparison: 11/09/04.

CLINICAL DATA: Renal failure; congestive heart failure/pneumonia
 CHEST PORTABLE ONE VIEW, 11/10/04, 3973 HOURS:

[view not recorded]
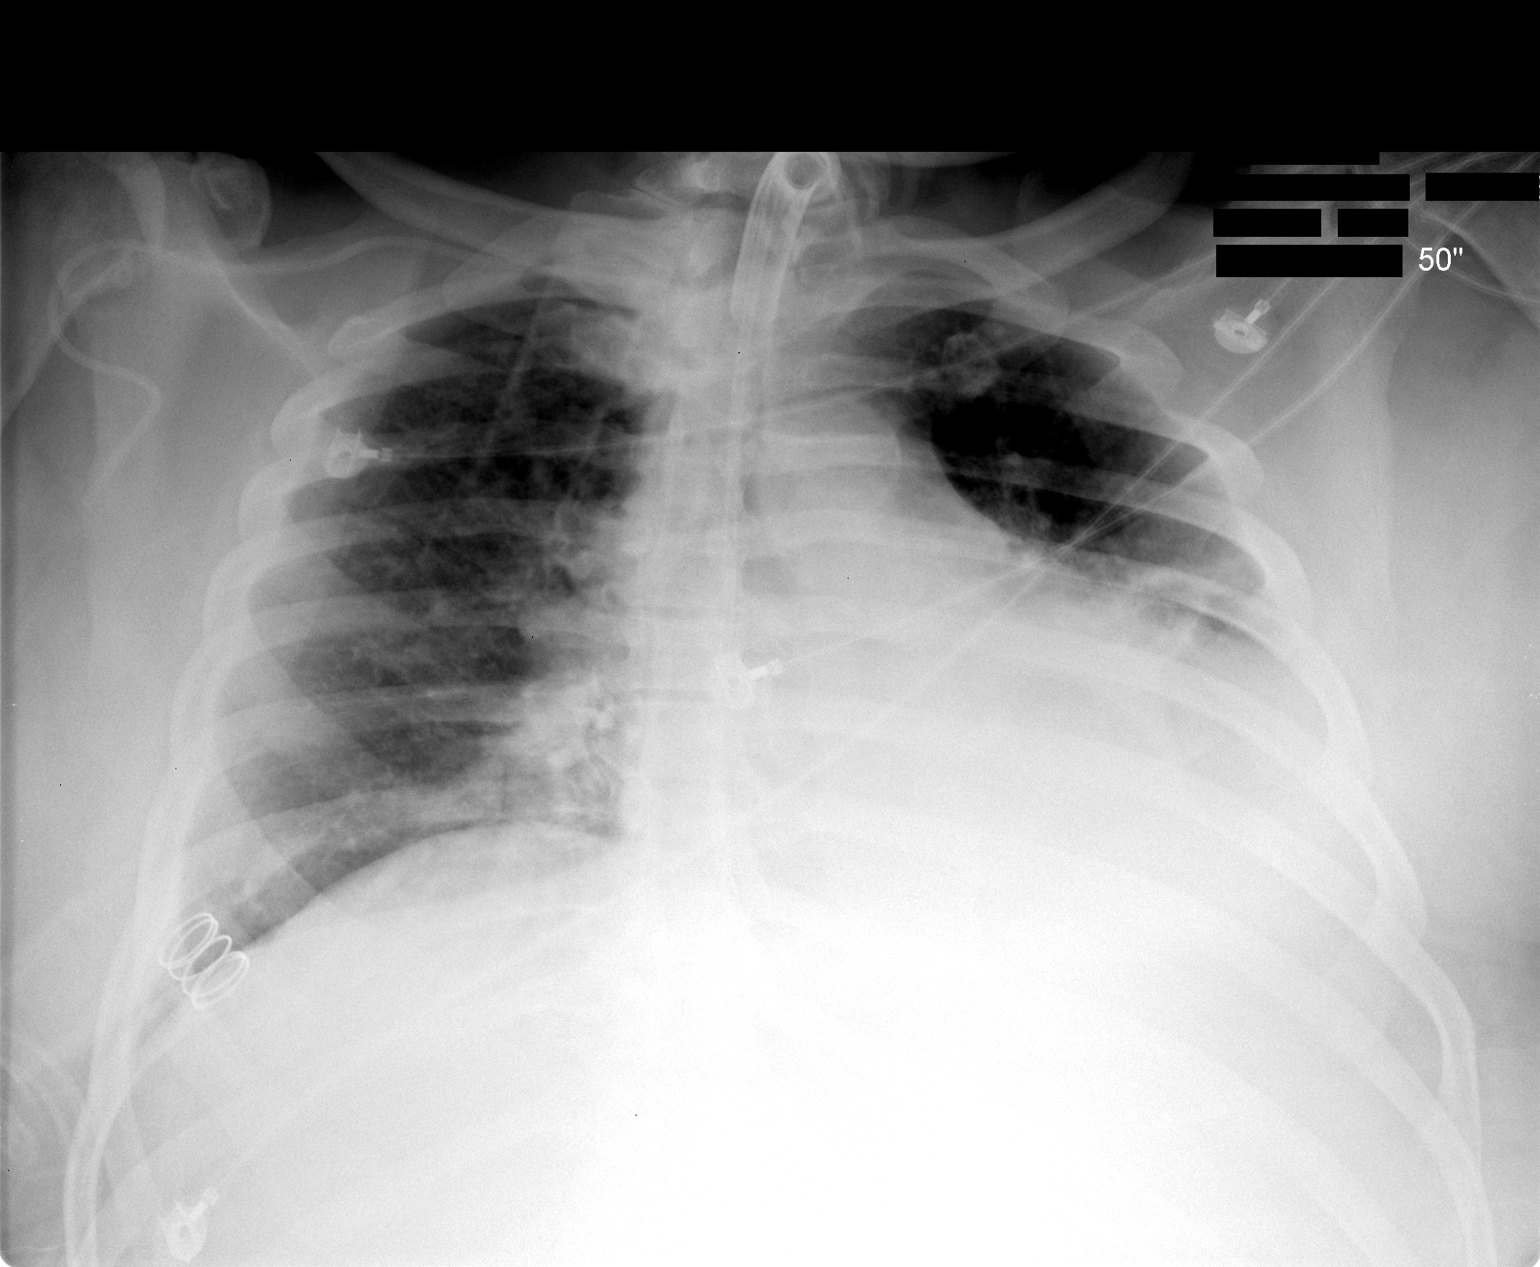

[1 of 1 positions shown; findings below may reference images not displayed]

Support devices are stable.  Continued left lower lobe air space opacity.  This is increased slightly since prior study.  Probable small left effusion.  Increasing pulmonary edema.
IMPRESSION: Worsening aeration of the lungs with increase in left lower lobe opacity and probable edema.

## 2006-08-06 IMAGING — CR DG CHEST 1V PORT
1 series · 1 of 1 positions shown · non-contrast
Comparison: 11/10/04.

CLINICAL DATA: Acute renal failure, CHF, ventilator.
 PORTABLE [id number] VIEW (2732):

[view not recorded]
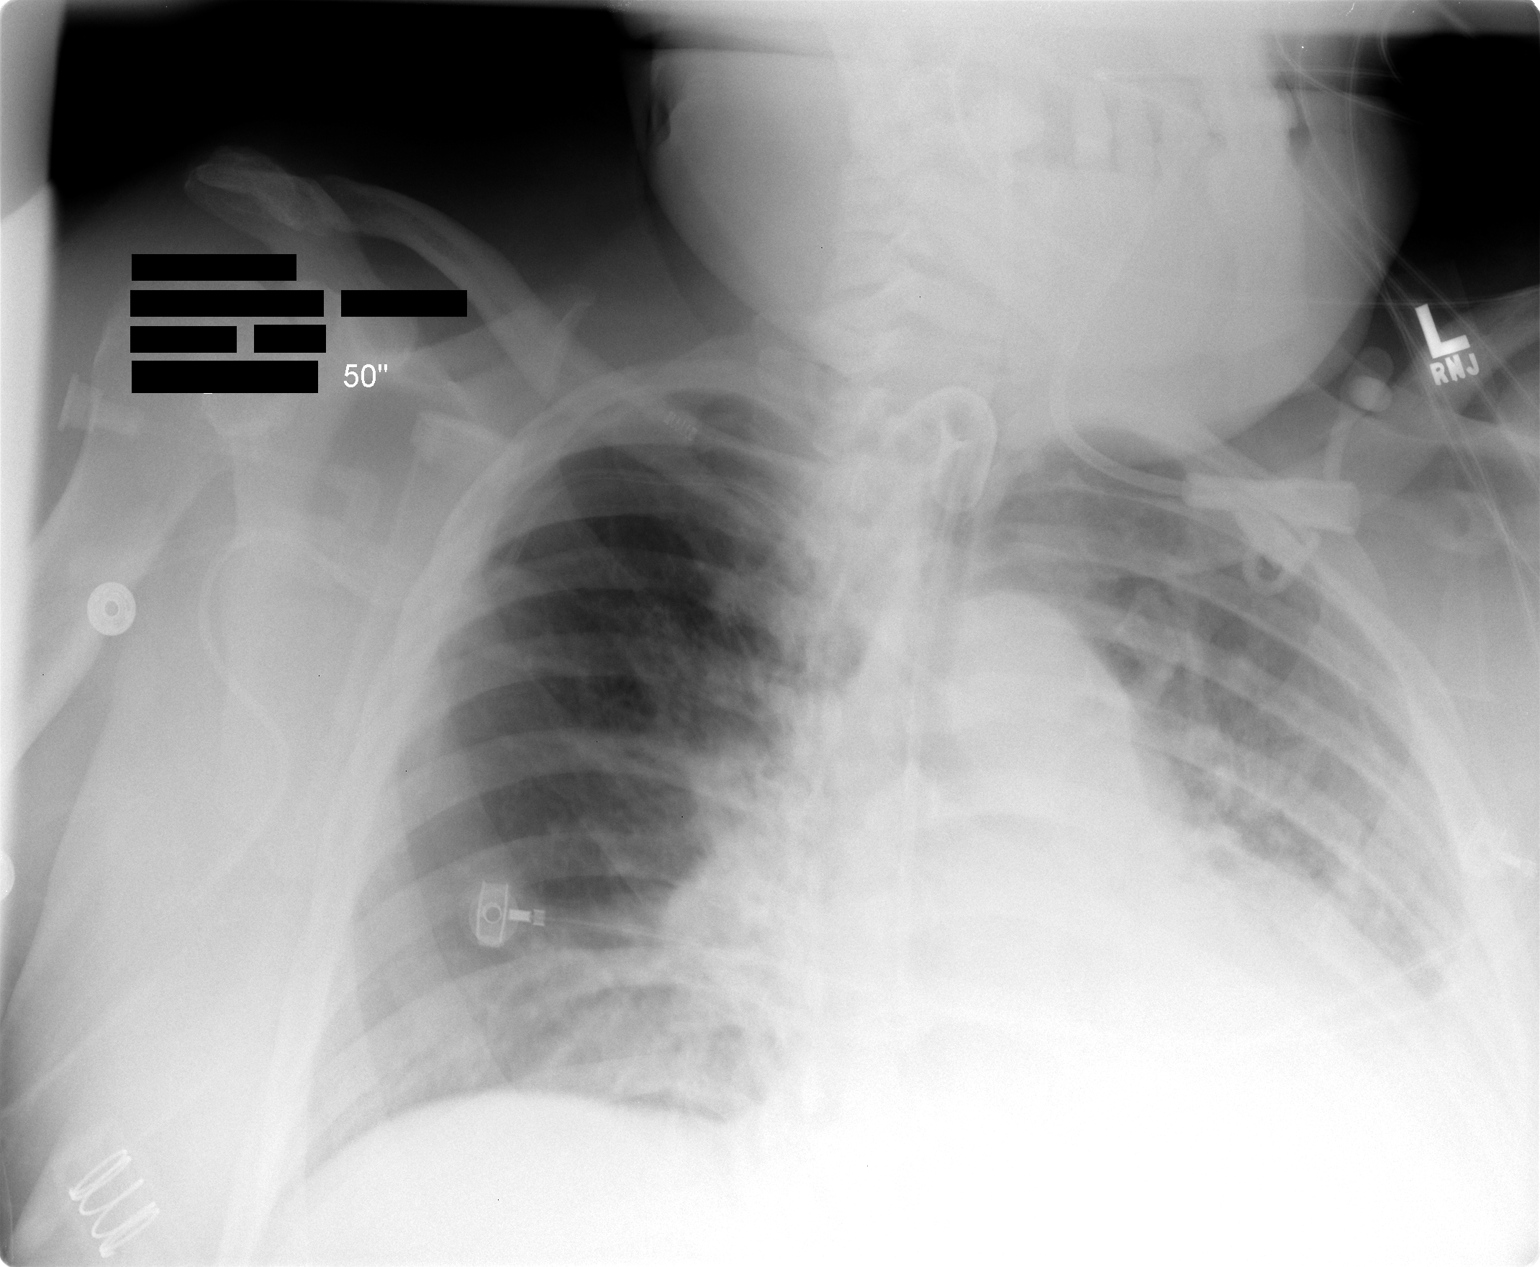

[1 of 1 positions shown; findings below may reference images not displayed]

Support devices are stable.  Stable cardiomegaly. Probable mild edema with continued focal left lower lobe opacity. No real change since prior study.
IMPRESSION: No interval change.

## 2006-08-07 IMAGING — CR DG CHEST 1V PORT
1 series · 1 of 1 positions shown · non-contrast
Comparison: 11/12/04.

CLINICAL DATA: Acute renal failure, CHF.
 PORTABLE CHEST:

[view not recorded]
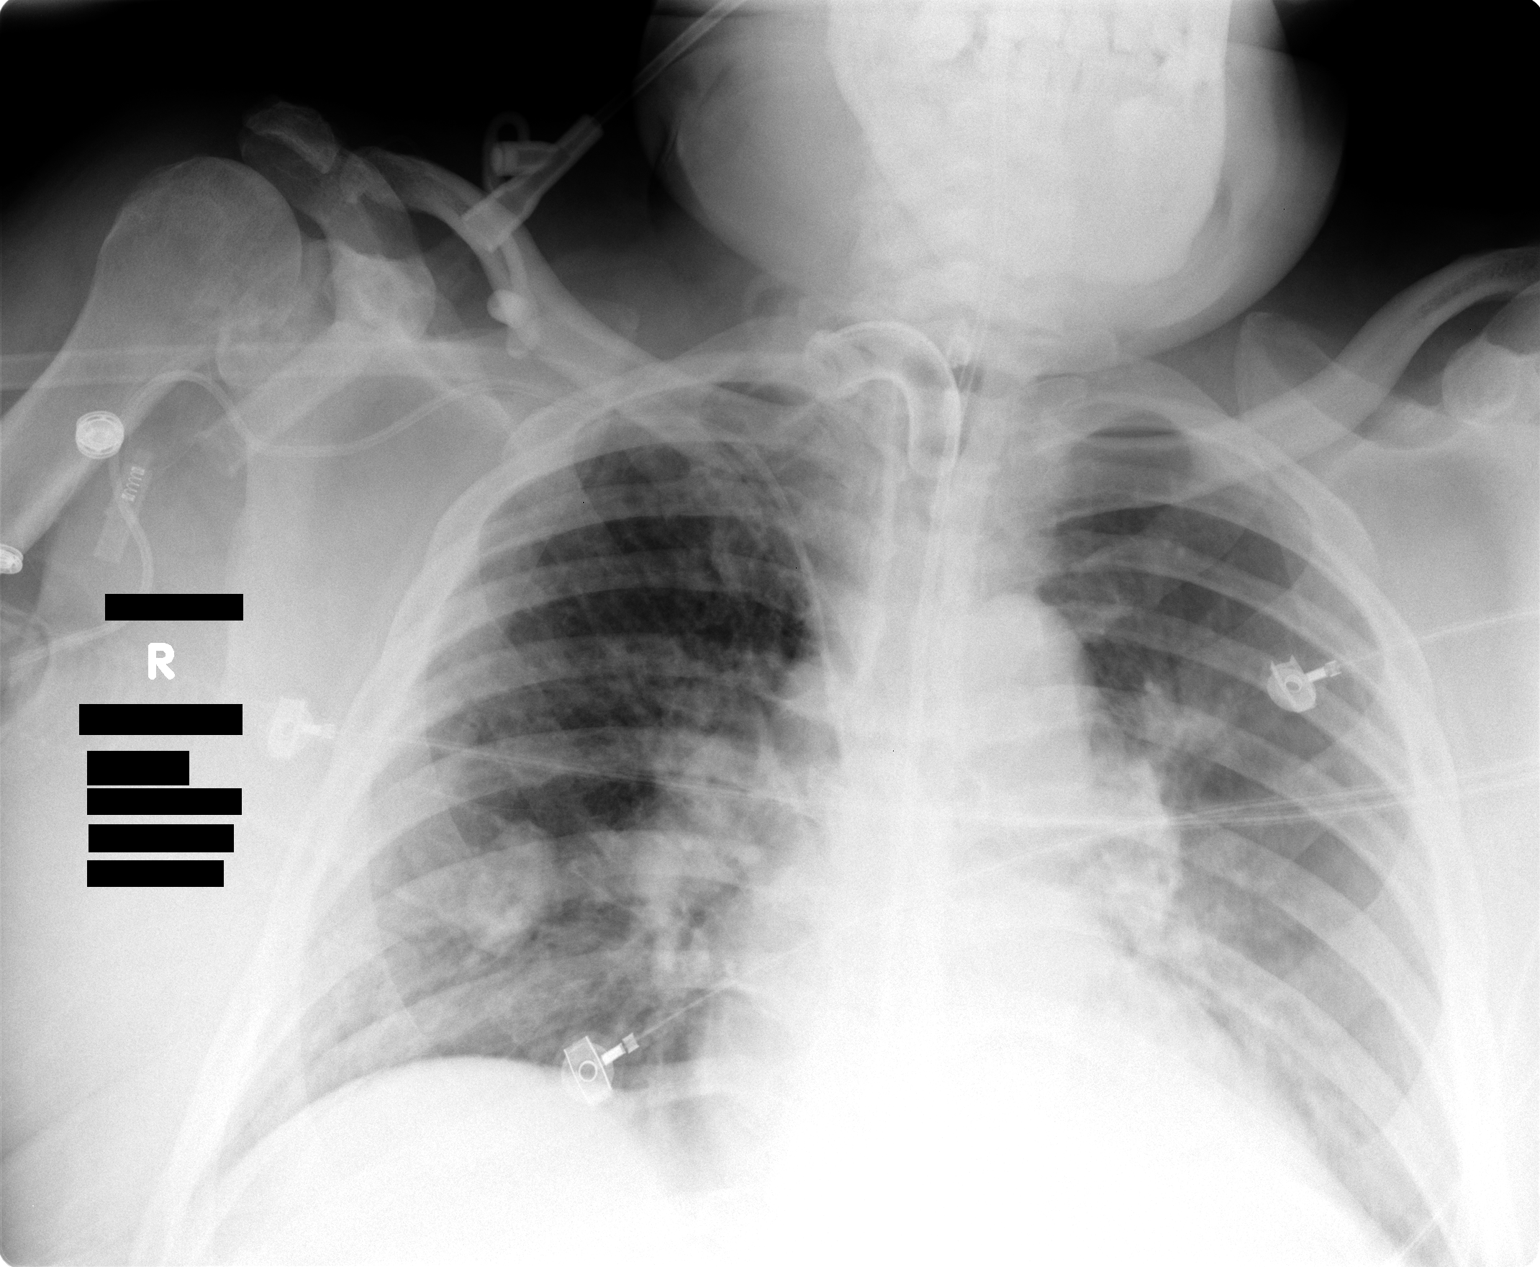

[1 of 1 positions shown; findings below may reference images not displayed]

Tracheostomy tube, small bore feeding tube, right central venous catheter, cardiomegaly and left lower lung atelectasis/consolidation again noted.   Pulmonary vascular congestion and mild interstitial edema now noted.  No other change is identified.
IMPRESSION: Slight increase in pulmonary vascular congestion otherwise unchanged chest.

## 2006-08-09 IMAGING — CR DG CHEST 1V PORT
1 series · 1 of 1 positions shown · non-contrast
Comparison: 11/13/04.

CLINICAL DATA: Acute renal failure.  Congestive heart failure.
 PORTABLE CHEST -  1 VIEW - 11/15/04:

[view not recorded]
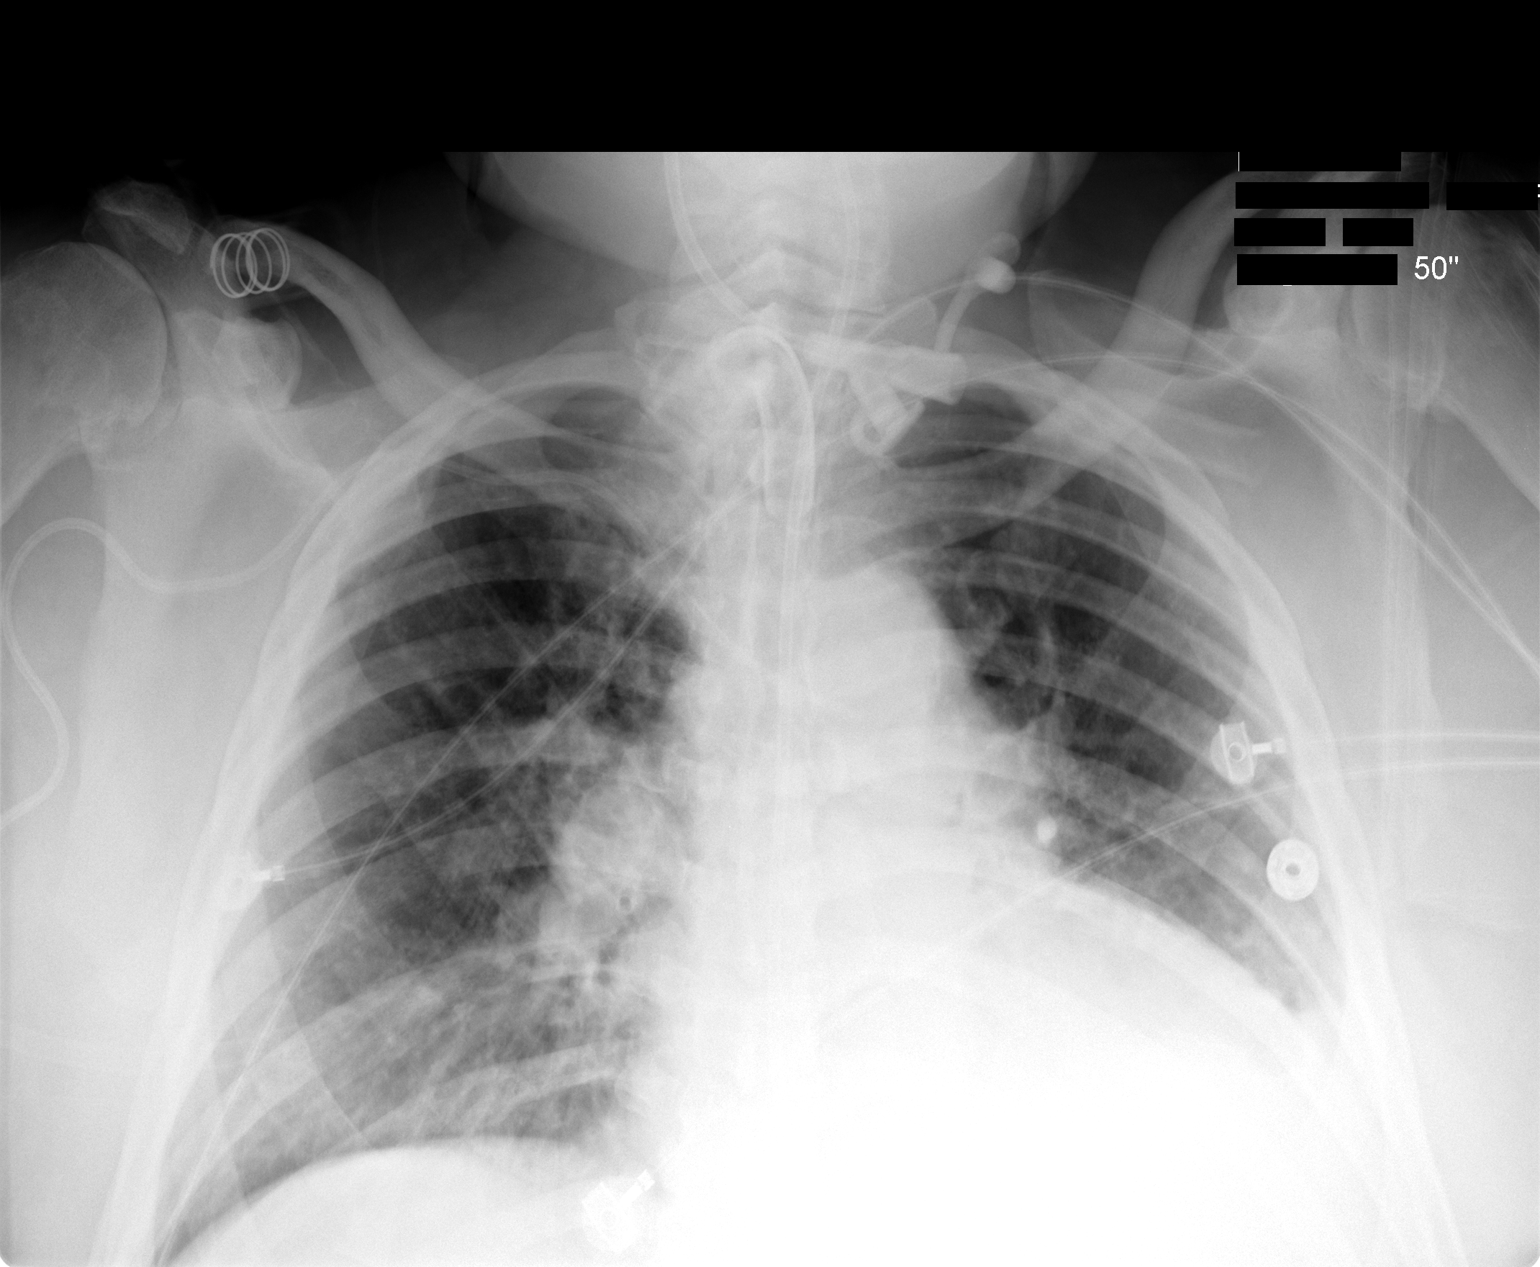

[1 of 1 positions shown; findings below may reference images not displayed]

AP chest at 2502 hours shows persistent low volumes with enlargement of the cardiopericardial silhouette.  There is left base atelectasis or infiltrate with probable fusion.  Pulmonary vascular congestion is stable.  The endotracheal tube, feeding tube, and right subclavian central line remain in place.
IMPRESSION: Worsening left base atelectasis or infiltrate.

## 2006-08-09 IMAGING — CT CT ABDOMEN W/O CM
1 of 2 series · 14 of 32 positions shown, 18 images · IV contrast (agent unspecified)
Comparison: 11/08/04.

CLINICAL DATA: Renal failure.  Question abscess.
CT OF THE ABDOMEN AND PELVIS WITHOUT CONTRAST:
TECHNIQUE: As per request, contrast was instilled on the Floor per the patient?s Panda tube; however, there was a fistula, and further instillation of contrast was stopped.  
CT ABDOMEN WITHOUT CONTRAST:
Bibasilar atelectatic changes/small pleural effusions.  Small pericardial effusion.  Abnormal appearance of the gallbladder which appears to have slightly thickened walls with inflammation of surrounding fat planes.  There is also fluid extending from the lower aspect of the gallbladder into the right aspect of the pelvis leading to a residual 3.1 x 4.5 cm right lower quadrant fluid collection which may represent residual abscess.  Previously a catheter traversed through this region at which time this fluid collection contained air and measured 5.3 x 4 cm and therefore has decreased slightly in size.  
  Anterior abdominal wall dehiscence with what appears to be healing by secondary intent.  There is a fistula from the small bowel to the anterior abdominal wall, and contrast collects along the superficial aspect of the region of dehiscence.  Bilateral bowel diversion with the right appearing to connect to small bowel and the left to colon.  Atherosclerotic type changes abdominal aorta without aneurysmal dilatation.  No obvious dilatation of the renal collecting system.  Adrenal glands, spleen, and pancreas without obvious abnormality.

[Series 2: abd_pel 5.0 b40f st · axial · 0.88mm/px · z∈[-262,+214]mm · 14 of 107 slices shown, 18 images]
[im 8/107  soft-tissue]
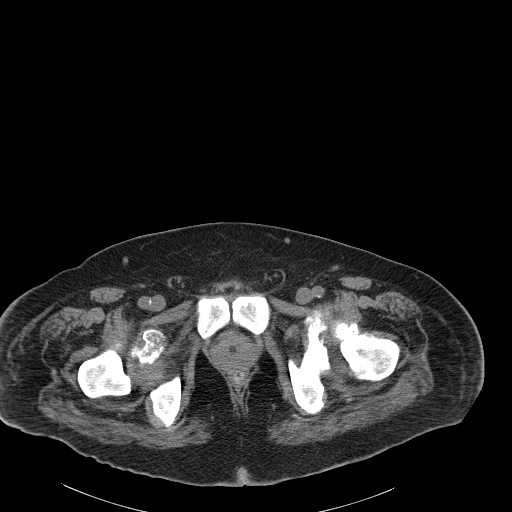
[im 8/107  bone]
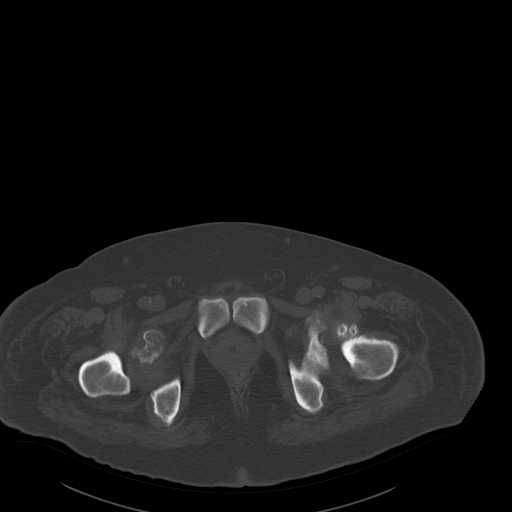
[im 15/107  soft-tissue]
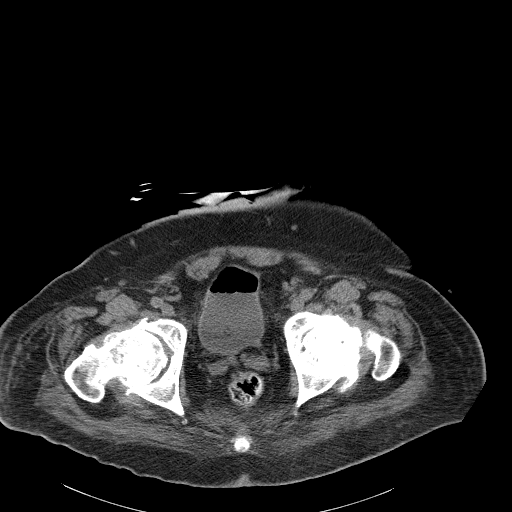
[im 25/107  soft-tissue]
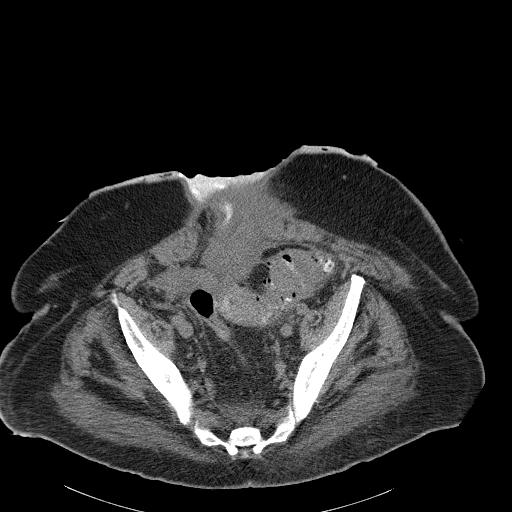
[im 32/107  soft-tissue]
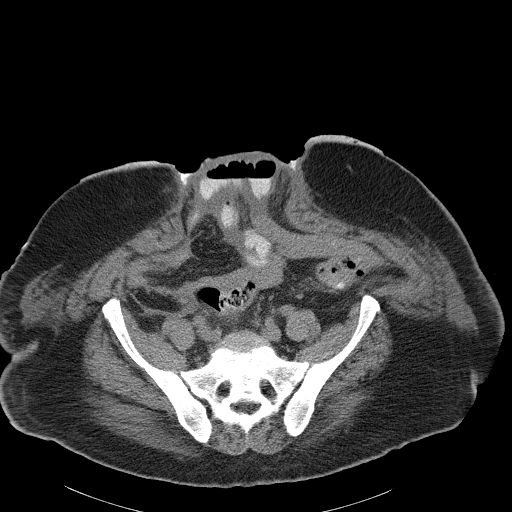
[im 39/107  soft-tissue]
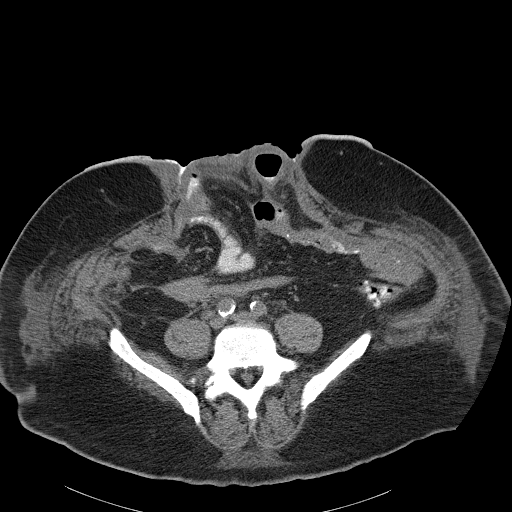
[im 50/107  soft-tissue]
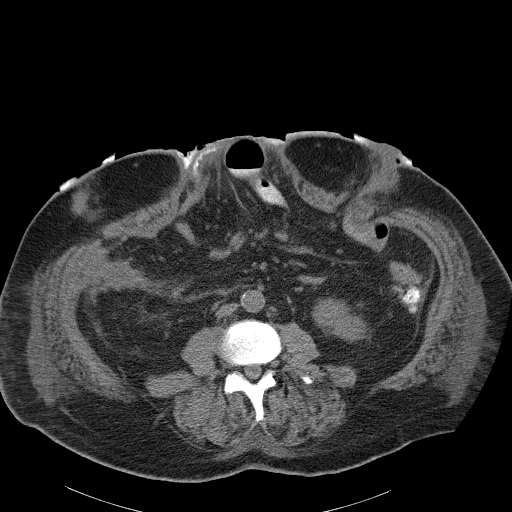
[im 57/107  soft-tissue]
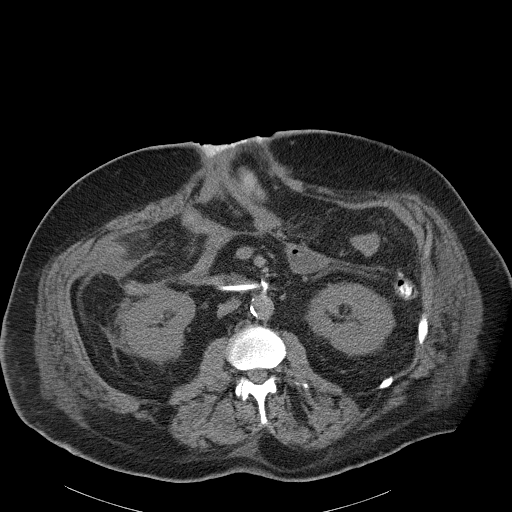
[im 68/107  soft-tissue]
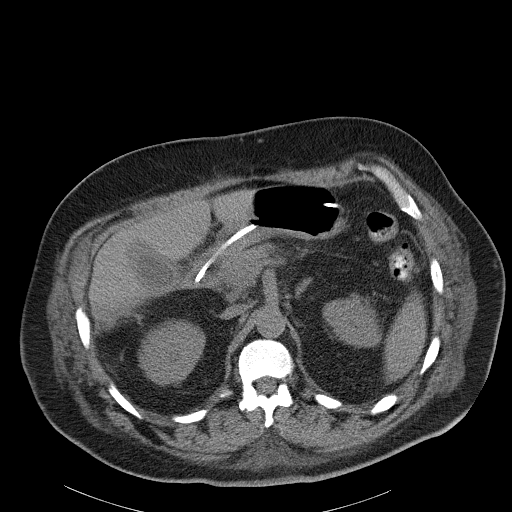
[im 75/107  soft-tissue]
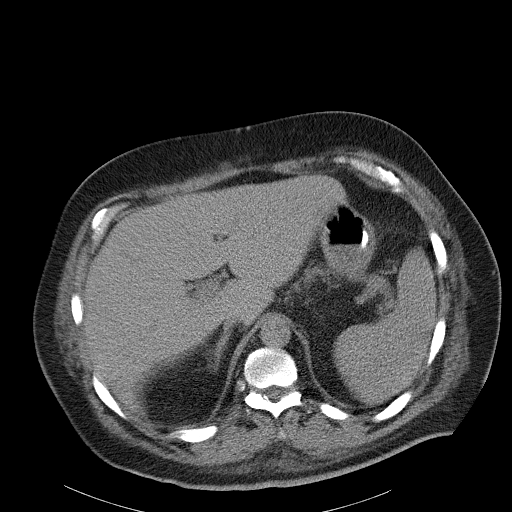
[im 75/107  bone]
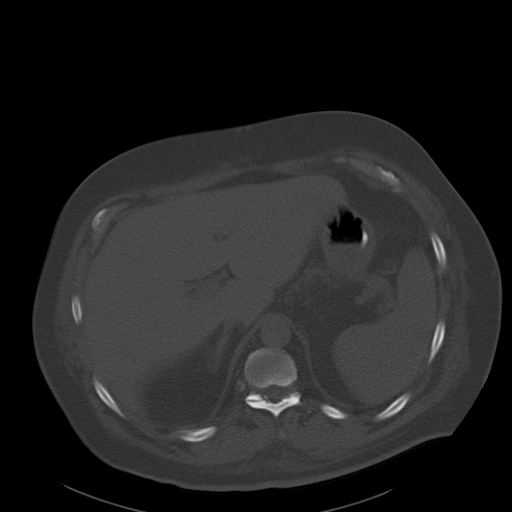
[im 82/107  soft-tissue]
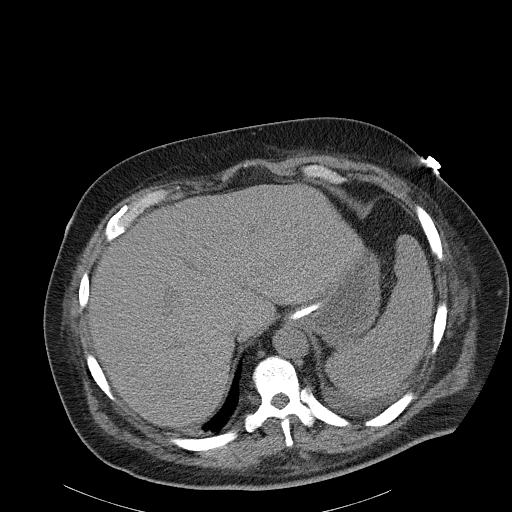
[im 92/107  soft-tissue]
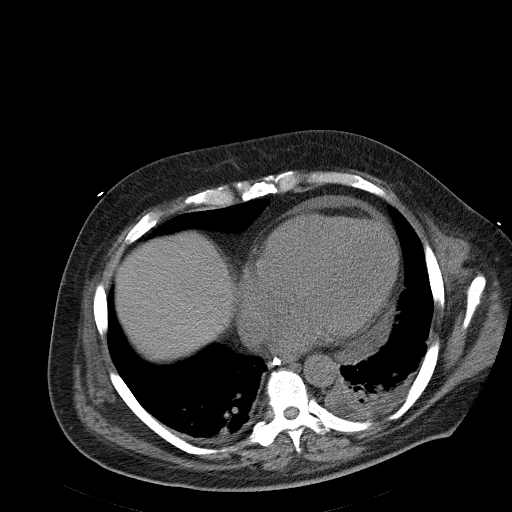
[im 92/107  lung]
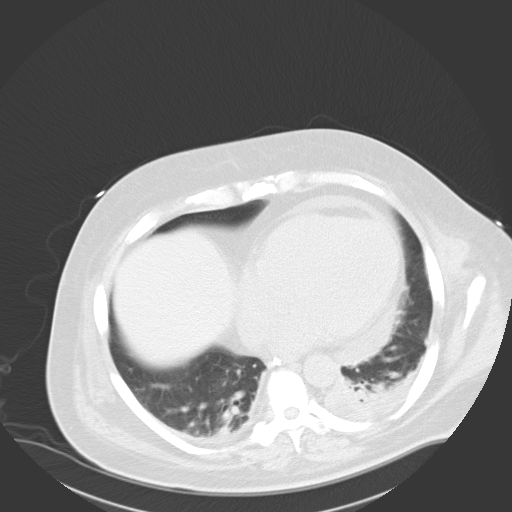
[im 96/107  lung]
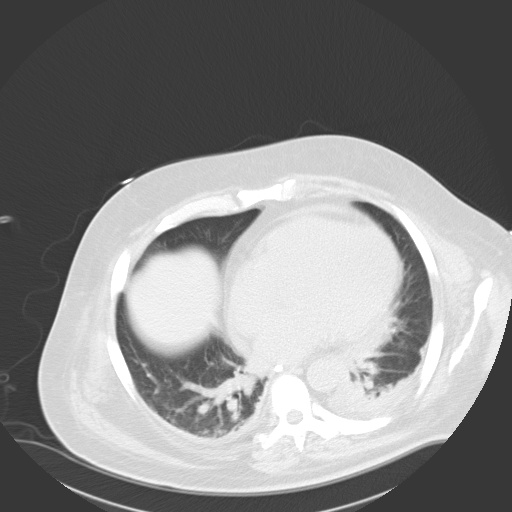
[im 99/107  soft-tissue]
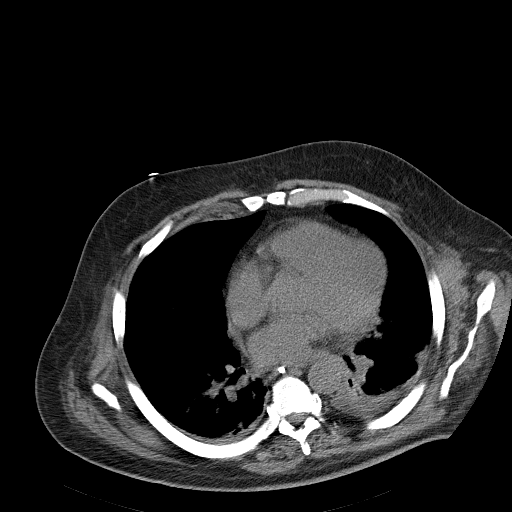
[im 99/107  lung]
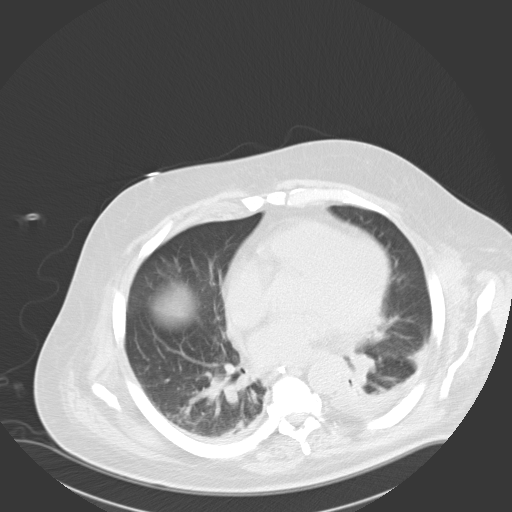
[im 103/107  lung]
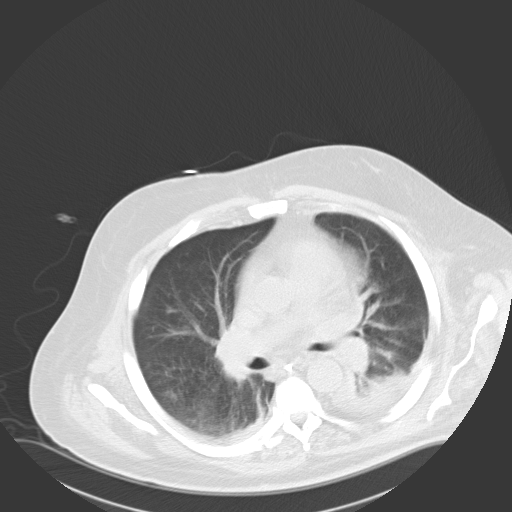

[14 of 32 positions shown; findings below may reference images not displayed]

IMPRESSION: 1.  Abnormal appearance of the gallbladder with gallbladder wall thickening and fluid along the inferior aspect of such extending into residual abscess in the right lower quadrant as noted above. 
2.  Fistula from small bowel traverses to the anterior abdominal wall dehiscence.  
CT PELVIS WITHOUT CONTRAST:
Markedly abnormal appearance of sigmoid colon with thickened walls which may represent diverticulosis/diverticulitis type changes, but without surrounding abscess immediately adjacent to the diseased-appearing sigmoid colon.  Because of this appearance, exclusion of malignancy would be difficult.  Clinical correlation and follow-up recommended.  
In the lower presacral region, there is a 1.7 x 4 cm fluid collection/thickening which appears unchanged.  Markedly abnormal appearance of the hip joints bilaterally may be degenerative in origin.
IMPRESSION: 1.  Markedly abnormal appearance of the sigmoid colon as discussed above.
2.  Presacral fluid/soft tissue thickening unchanged.

## 2006-08-11 IMAGING — CR DG CHEST 1V PORT
1 series · 1 of 1 positions shown · non-contrast
Comparison: 11/15/04.

CLINICAL DATA: Acute renal failure.  CHF.  
 PORTABLE 1 ? VIEW CHEST:

[view not recorded]
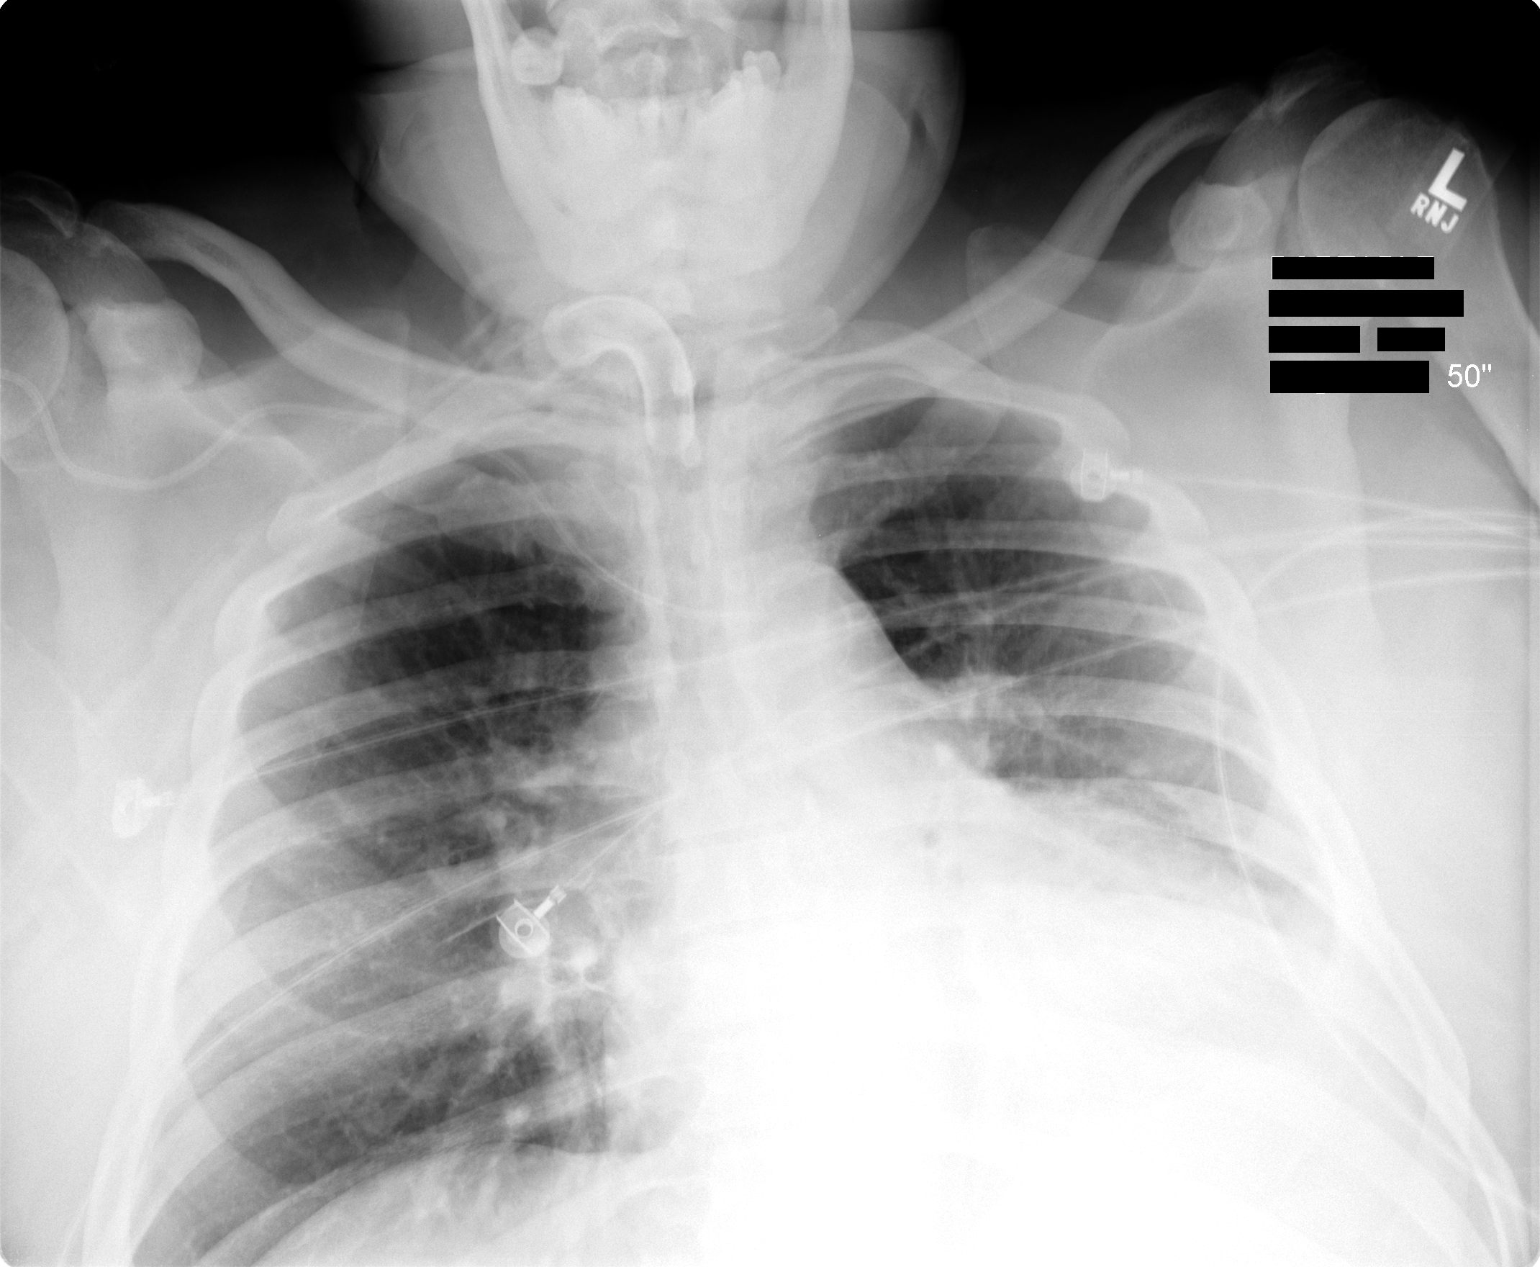

[1 of 1 positions shown; findings below may reference images not displayed]

AP film at [DATE] hours shows persistent low volumes with cardiomegaly and pulmonary vascular congestion.  Atelectasis or infiltrate at the left base is stable.  Tracheostomy tube remains in place but the feeding catheter has been removed in the interval.  The right subclavian central line tip has flipped into the left brachiocephalic vein.
IMPRESSION: 1.  Stable atelectasis or infiltrate in the left base.  
 2.  Cardiomegaly.
 3.  Right subclavian central venous catheter tip now projects at the level of the left brachiocephalic vein.

## 2006-08-12 IMAGING — CR DG CHEST 1V PORT
1 series · 1 of 1 positions shown · non-contrast
Comparison: none

CLINICAL DATA: Acute renal failure.  Left basilar atelectasis.  Infiltrate.
 PORTABLE CHEST:
 A single portable view of the chest is compared to previous study from 11/17/04.
 The tracheostomy tube is stable.  The right subclavian central venous catheter tip is in the brachiocephalic vein as seen on the previous study.  Low volume chest film with vascular crowding and atelectasis.  Persistent left basilar density.

[view not recorded]
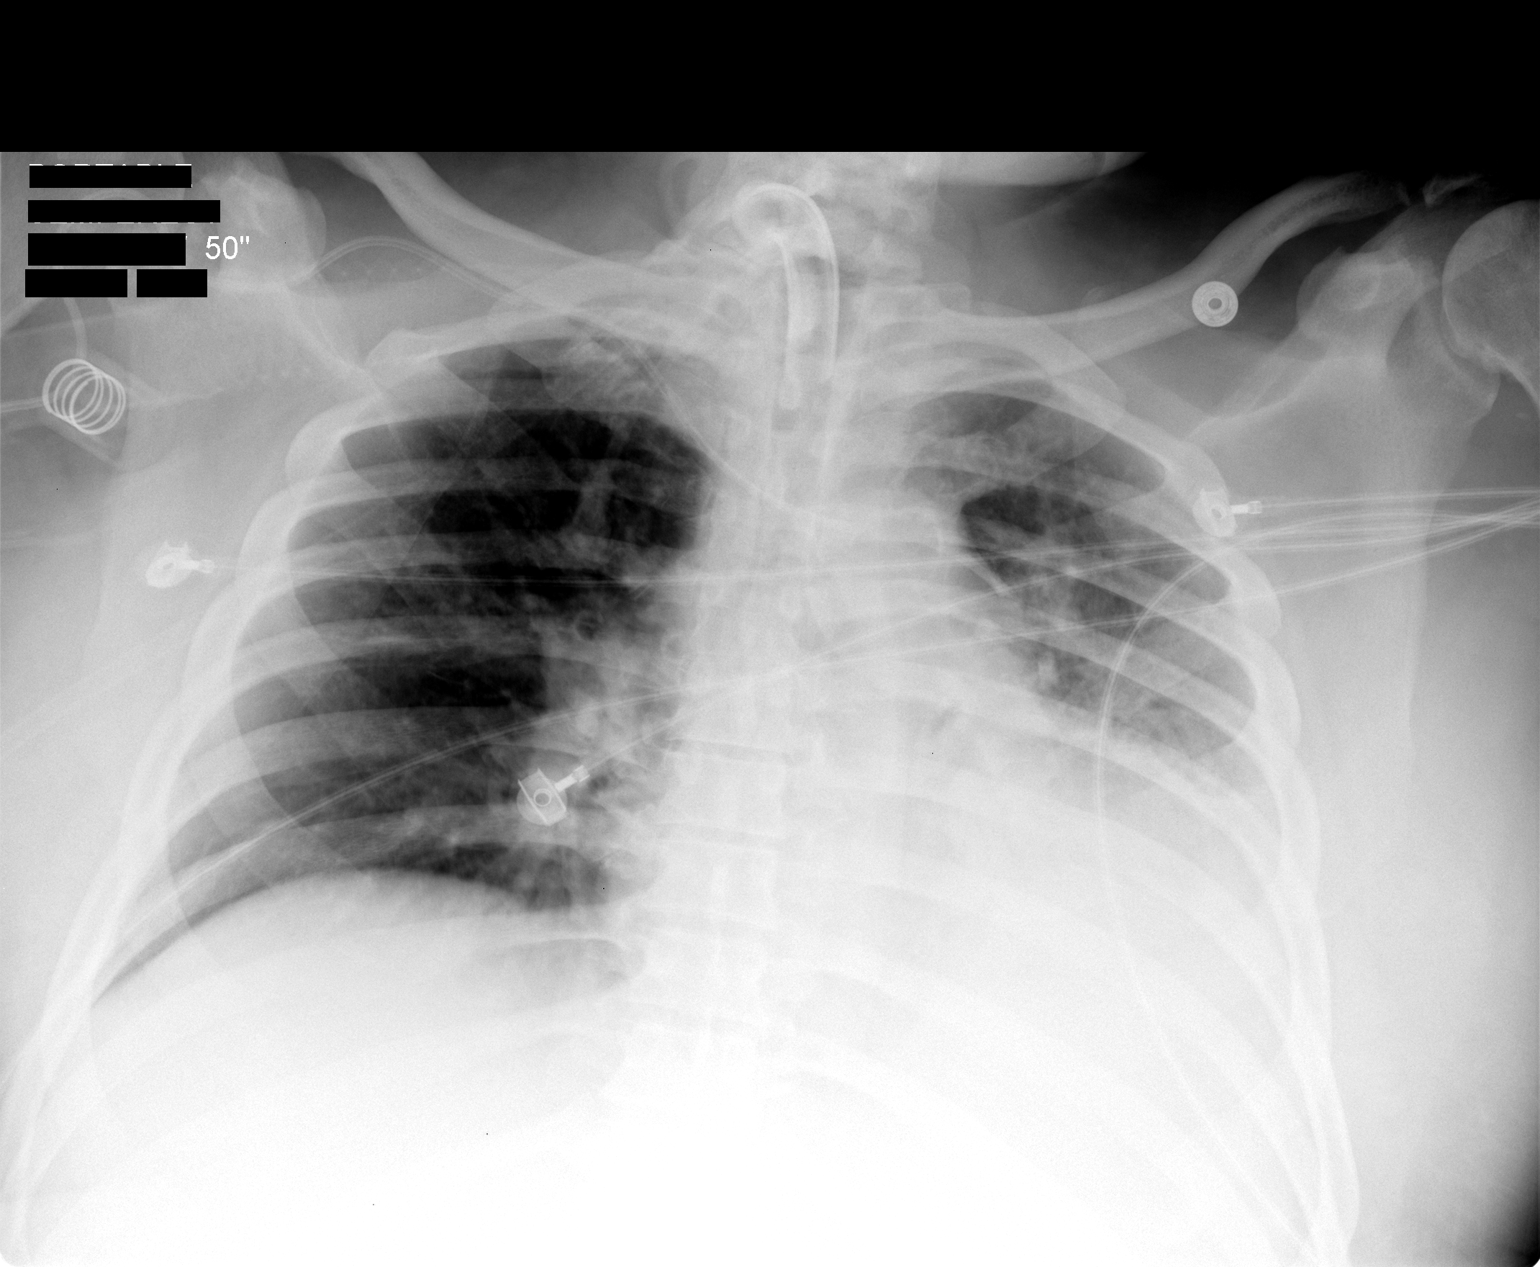

[1 of 1 positions shown; findings below may reference images not displayed]

IMPRESSION: 1.  Low volume chest film with vascular crowding and atelectasis.  Vascular congestion and persistent left lower lobe process.
 2.  Stable support apparatus.

## 2006-08-12 IMAGING — CR DG CHEST 1V PORT
1 series · 1 of 1 positions shown · non-contrast
Comparison: Earlier the same date.

CLINICAL DATA: PICC line placement. 
 PORTABLE CHEST - 1 VIEW - 11/18/04 AT 7200 HOURS:

[view not recorded]
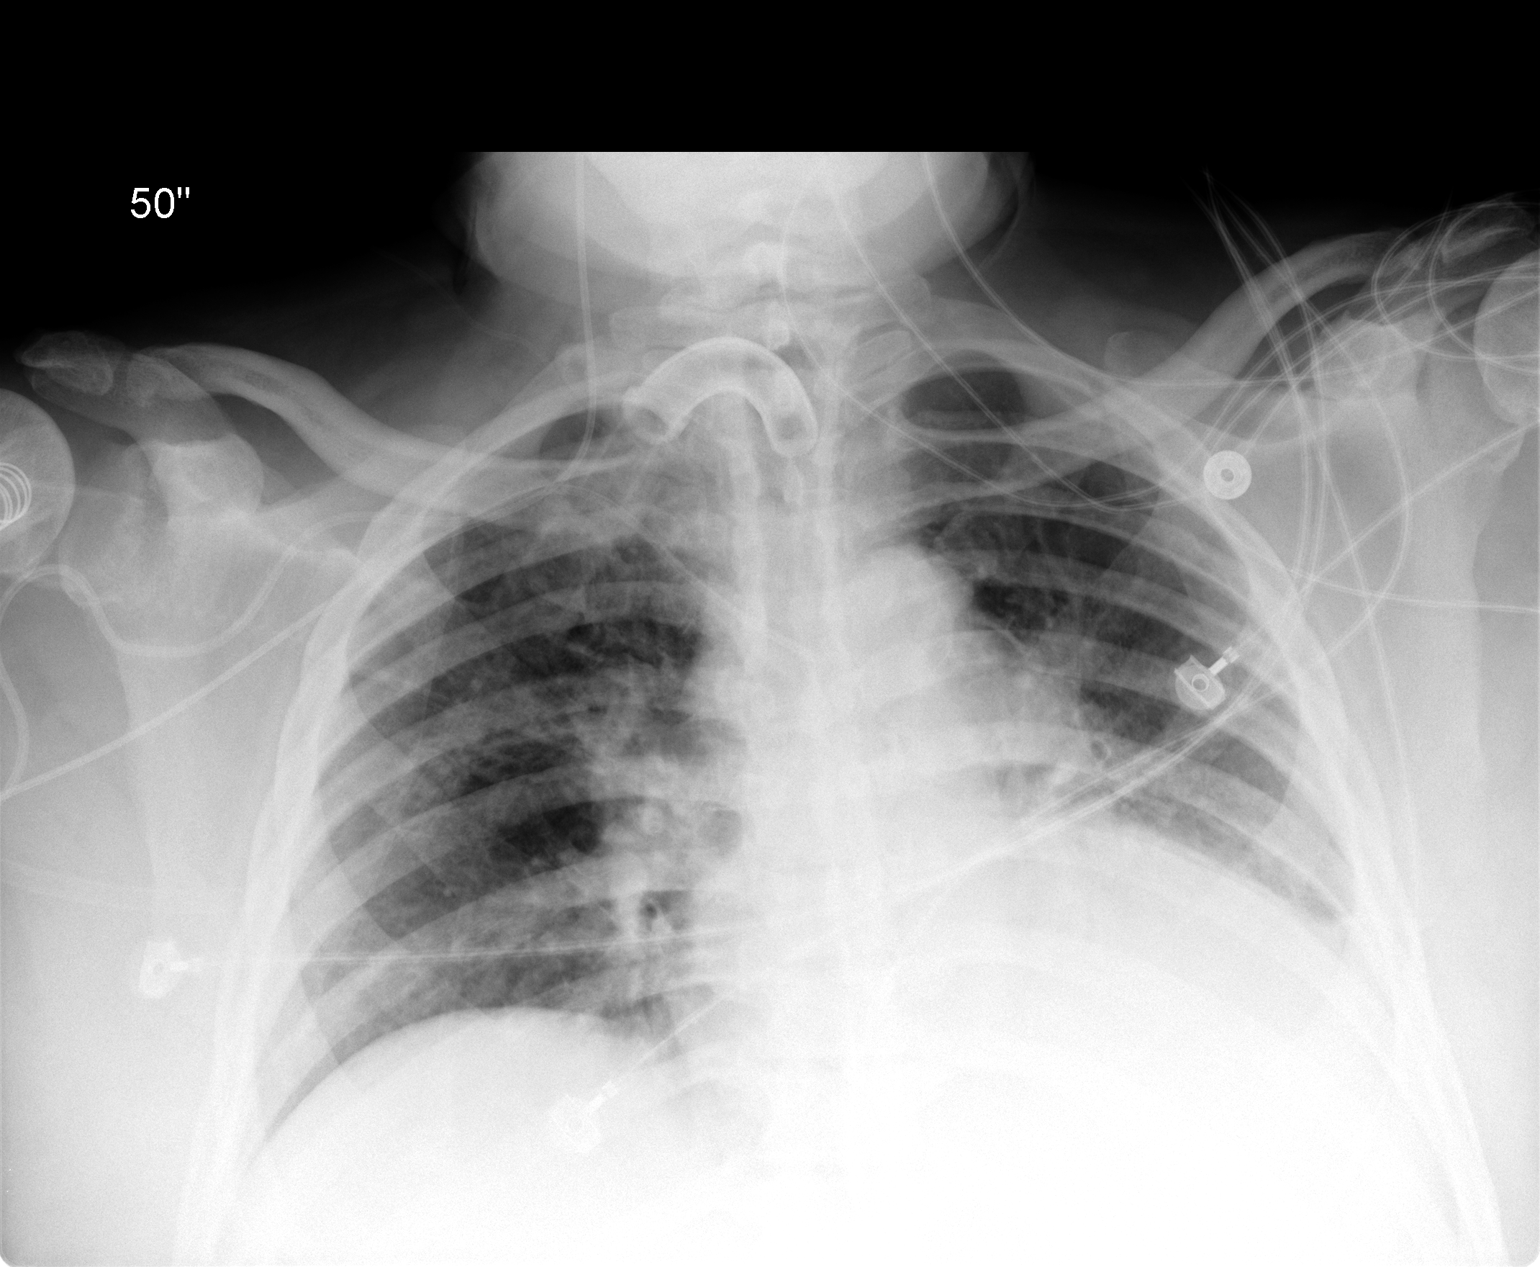

[1 of 1 positions shown; findings below may reference images not displayed]

There has been interval placement of a right-sided PICC, which extends cephalad into the neck.  The distal tip is not visualized.  Right subclavian central venous catheter is unchanged, with its tip in the brachiocephalic vein.  The tracheostomy appears stable.  Bibasilar pulmonary opacities and mild cardiomegaly remain.  Vascular congestion has slightly worsened.
IMPRESSION: The right-sided PICC extends superiorly into the neck and should be repositioned.  The tip of the catheter is not visualized, but it needs to be withdrawn at least 12 cm before readvancing it.

## 2006-08-12 IMAGING — CR DG CHEST 1V PORT
1 series · 1 of 1 positions shown · non-contrast
Comparison: none

CLINICAL DATA: Renal failure, check PICC placement.  
 PORTABLE CHEST, 11/18/04, [DATE] HOURS:
 Compared to a film from earlier today the right PICC line has been repositioned with the tip in the SVC.  No pneumothorax is seen.  The lungs are not well aerated with basilar volume loss.  Cardiomegaly is present.  Tracheostomy remains.  The other right central venous catheter is unchanged with the tip possibly at the junction of the innominate vein and SVC.

[view not recorded]
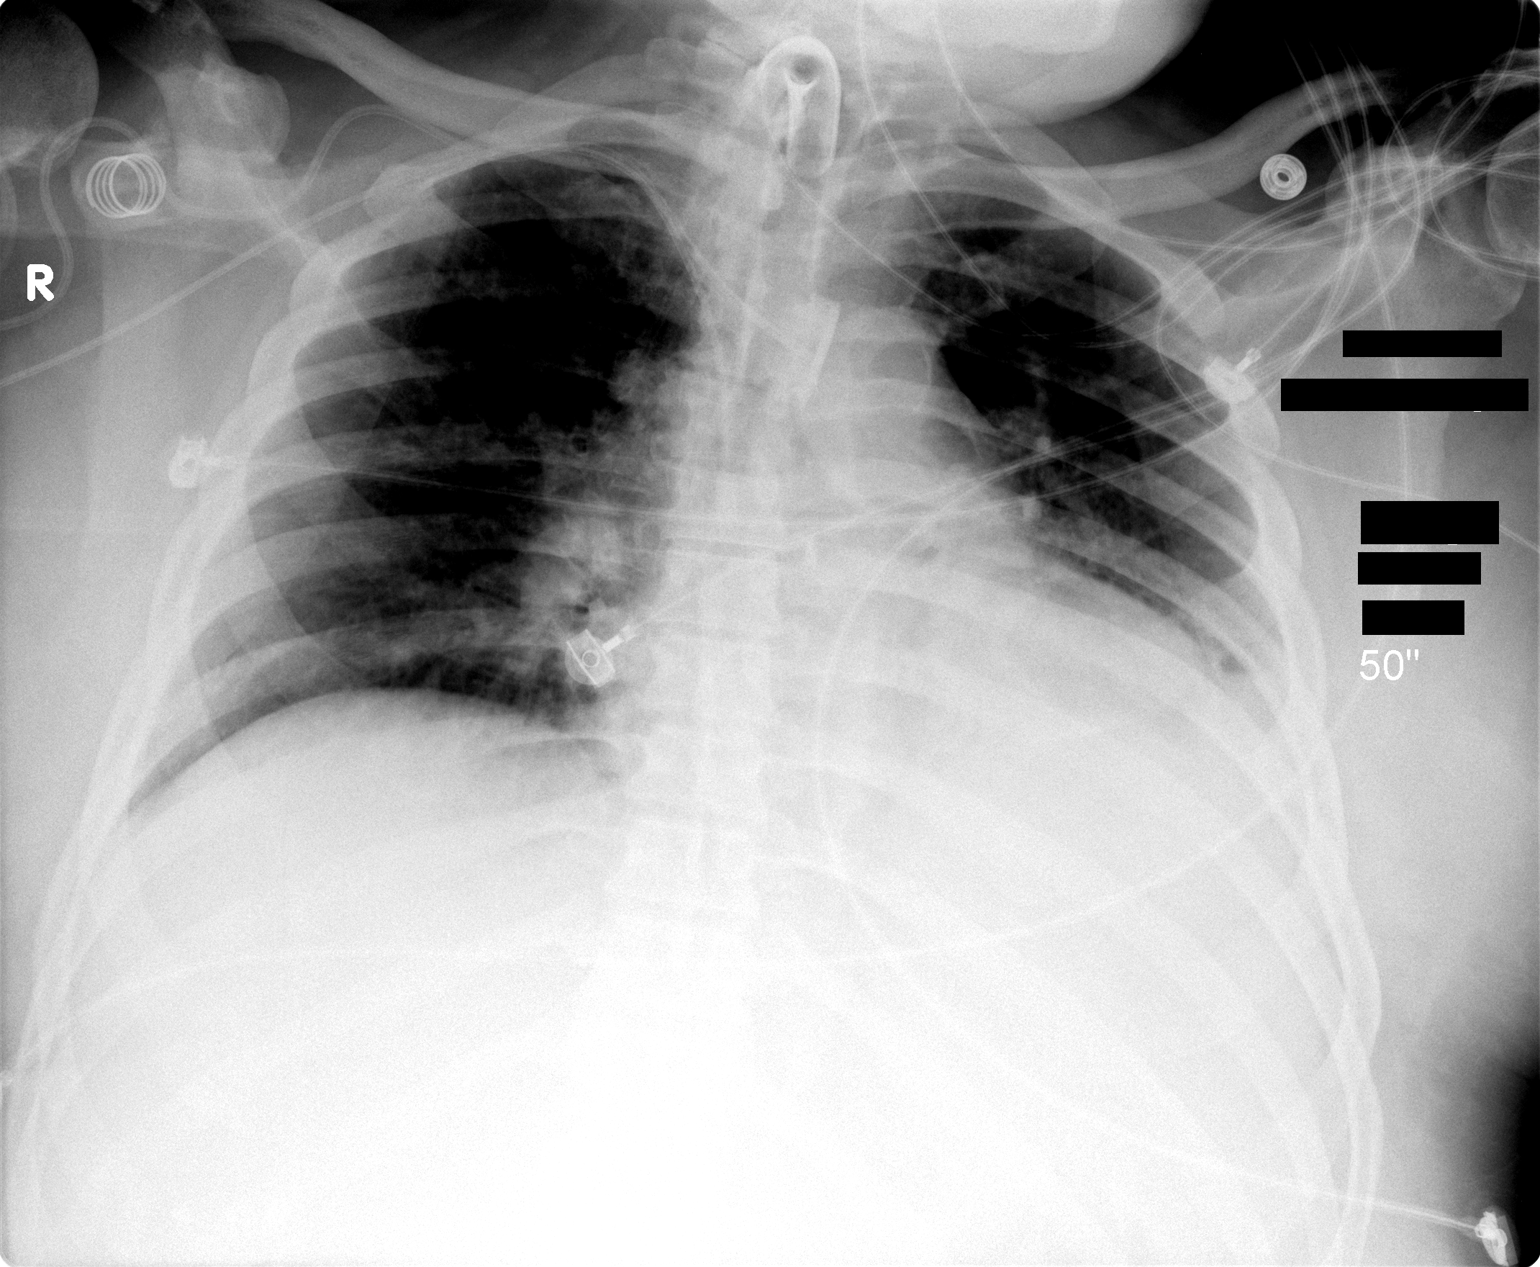

[1 of 1 positions shown; findings below may reference images not displayed]

IMPRESSION: Right PICC line has been repositioned with the tip in the SVC.  The second right central venous catheter is unchanged in position.

## 2006-08-13 IMAGING — CR DG CHEST 1V PORT
1 series · 1 of 1 positions shown · non-contrast
Comparison: 11/18/04.

CLINICAL DATA: Acute renal failure.  Congestive heart failure.
 PORTABLE CHEST - 1 VIEW - 11/18/04:

[view not recorded]
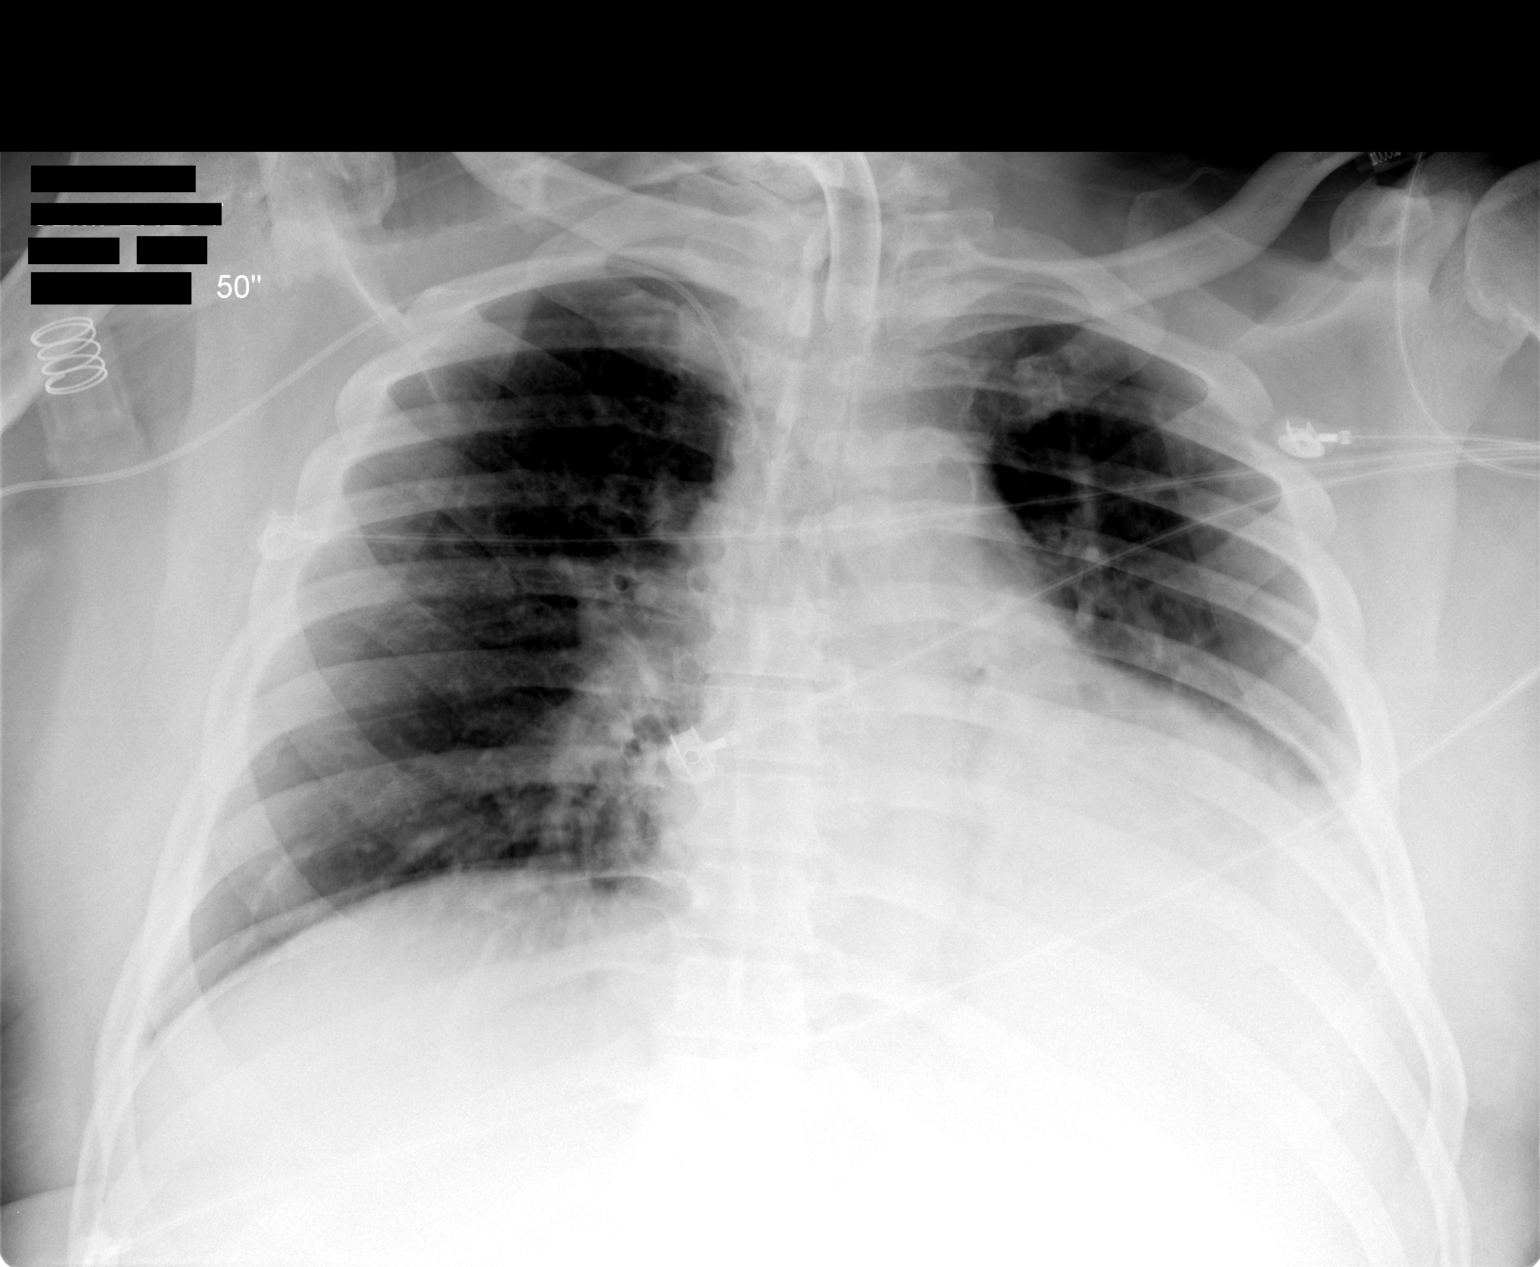

[1 of 1 positions shown; findings below may reference images not displayed]

AP film at 2888 hours shows low volumes with vascular congestion but no overt pulmonary edema.  There is left base atelectasis or infiltrate, stable.  A right PICC line and a tracheostomy tube remain in place.  The right subclavian central line has been pulled.
IMPRESSION: 1.  No change in atelectasis or infiltrate at the left base.
 2.  Interval removal of the right subclavian central line.

## 2006-08-14 IMAGING — CR DG CHEST 1V PORT
2 series · 2 of 2 positions shown · non-contrast
Comparison: none

CLINICAL DATA: Congestive heart failure. 
 PORTABLE CHEST - 1 VIEW - 11/20/04 AT 0868 HOURS AND 7454 HOURS (SECOND VIEW HAD TO BE OBTAINED TO INCLUDED THE LUNG BASES):

[view not recorded (1 of 2)]
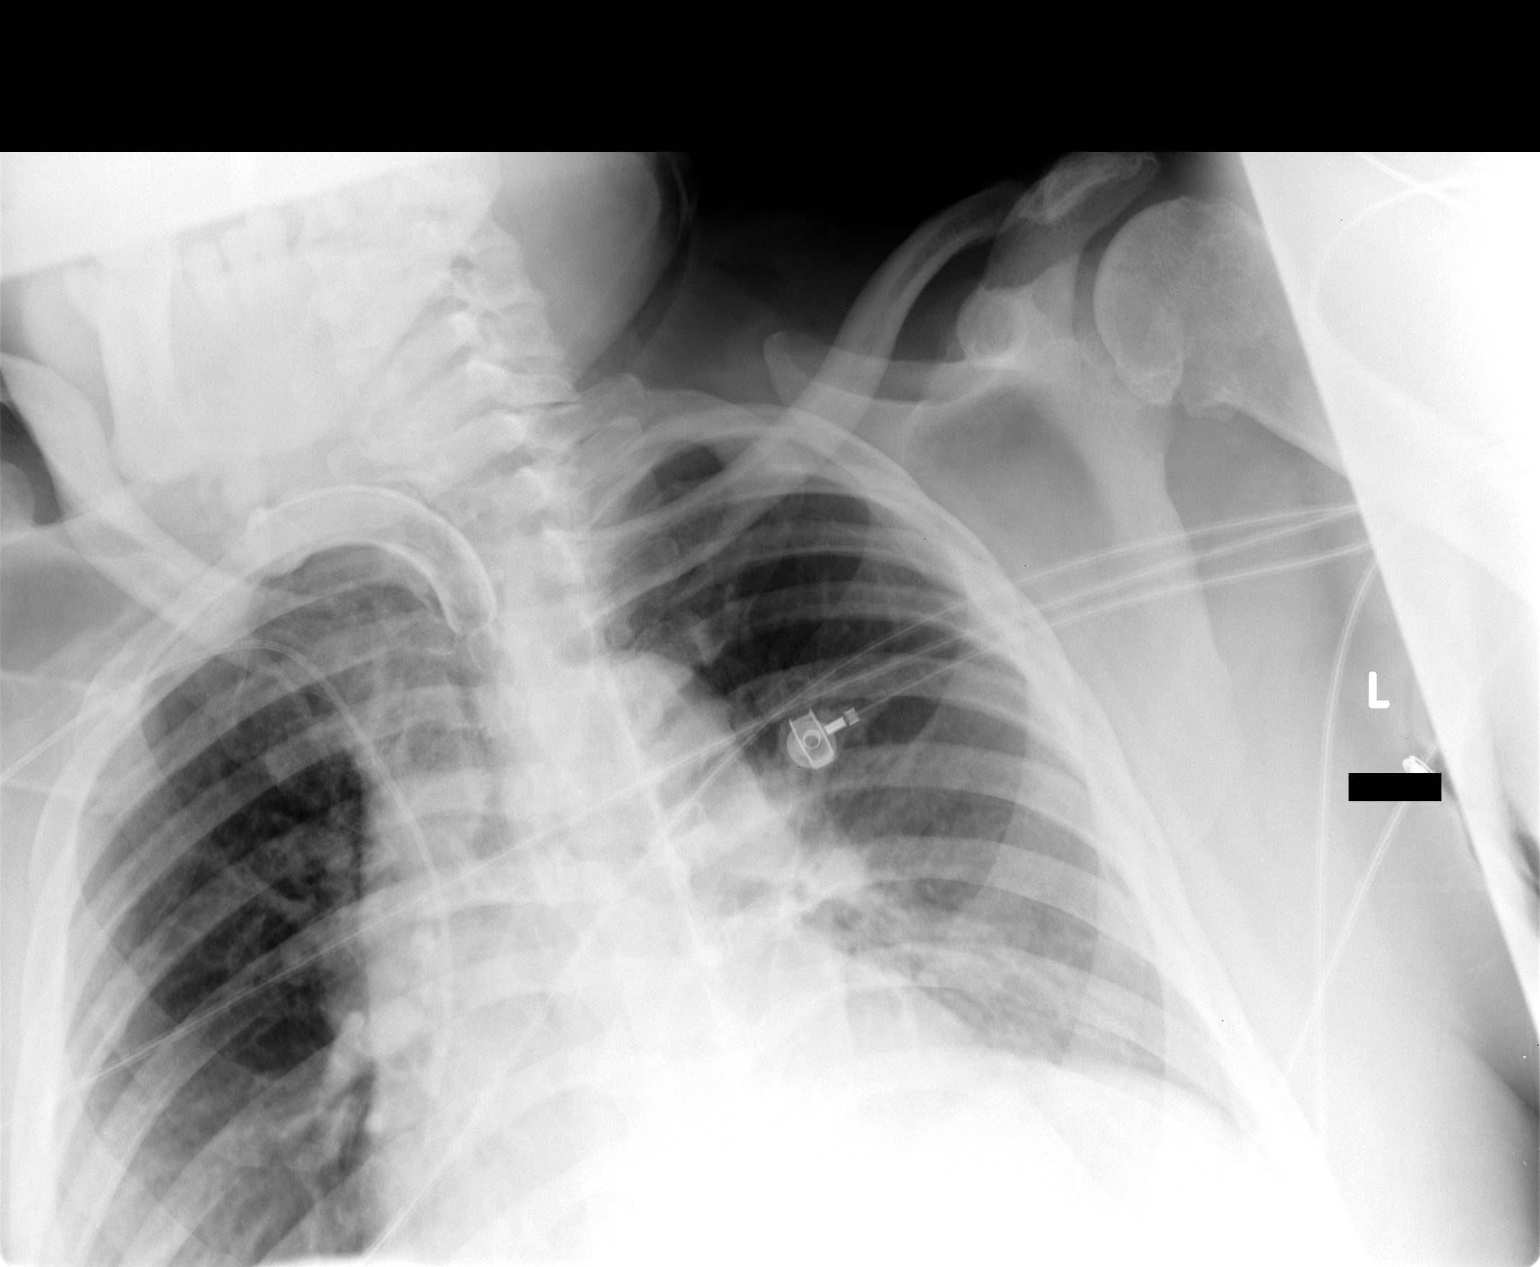

[view not recorded (2 of 2)]
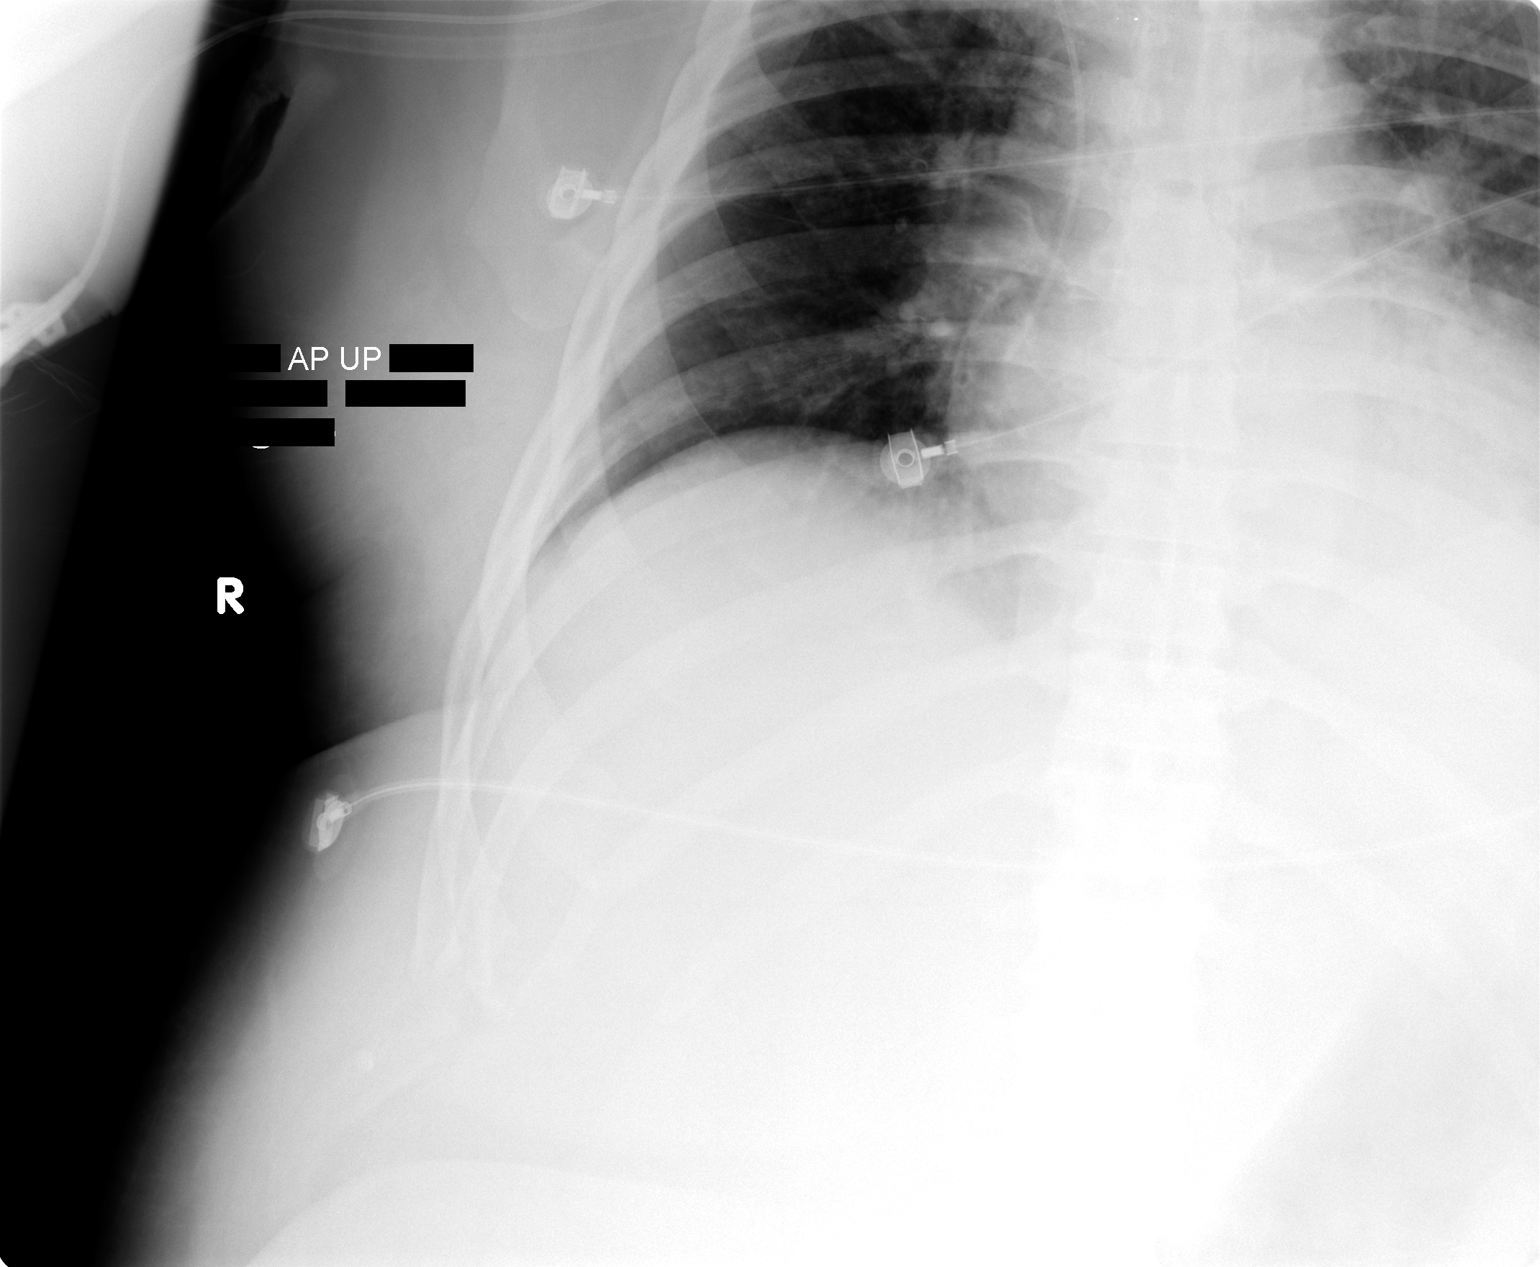

[2 of 2 positions shown; findings below may reference images not displayed]

FINDINGS: The tracheostomy tube tip is midline.  The right PICC line tip is in the right atrium.  Pulmonary vascular congestion and cardiomegaly.  Left base opacification may represent atelectasis versus infiltrate.
IMPRESSION: 1.  Right PICC catheter tip now in the region of the right atrium.
 2.  Remainder of findings similar to the prior examination.

## 2006-08-19 IMAGING — CR DG CHEST 1V PORT
1 series · 1 of 1 positions shown · non-contrast
Comparison: none

CLINICAL DATA: The patient is on a ventilator.
 PORTABLE CHEST - 1 VIEW ? 11/25/04: 
 AP view of the chest at 6486 hours with comparison views of 11/21/04 reveals the heart size to be enlarged.  There is improved aeration in the bases with persistent atelectasis particularly in the right base.  There may be vascular congestion particularly in the right lung.

[view not recorded]
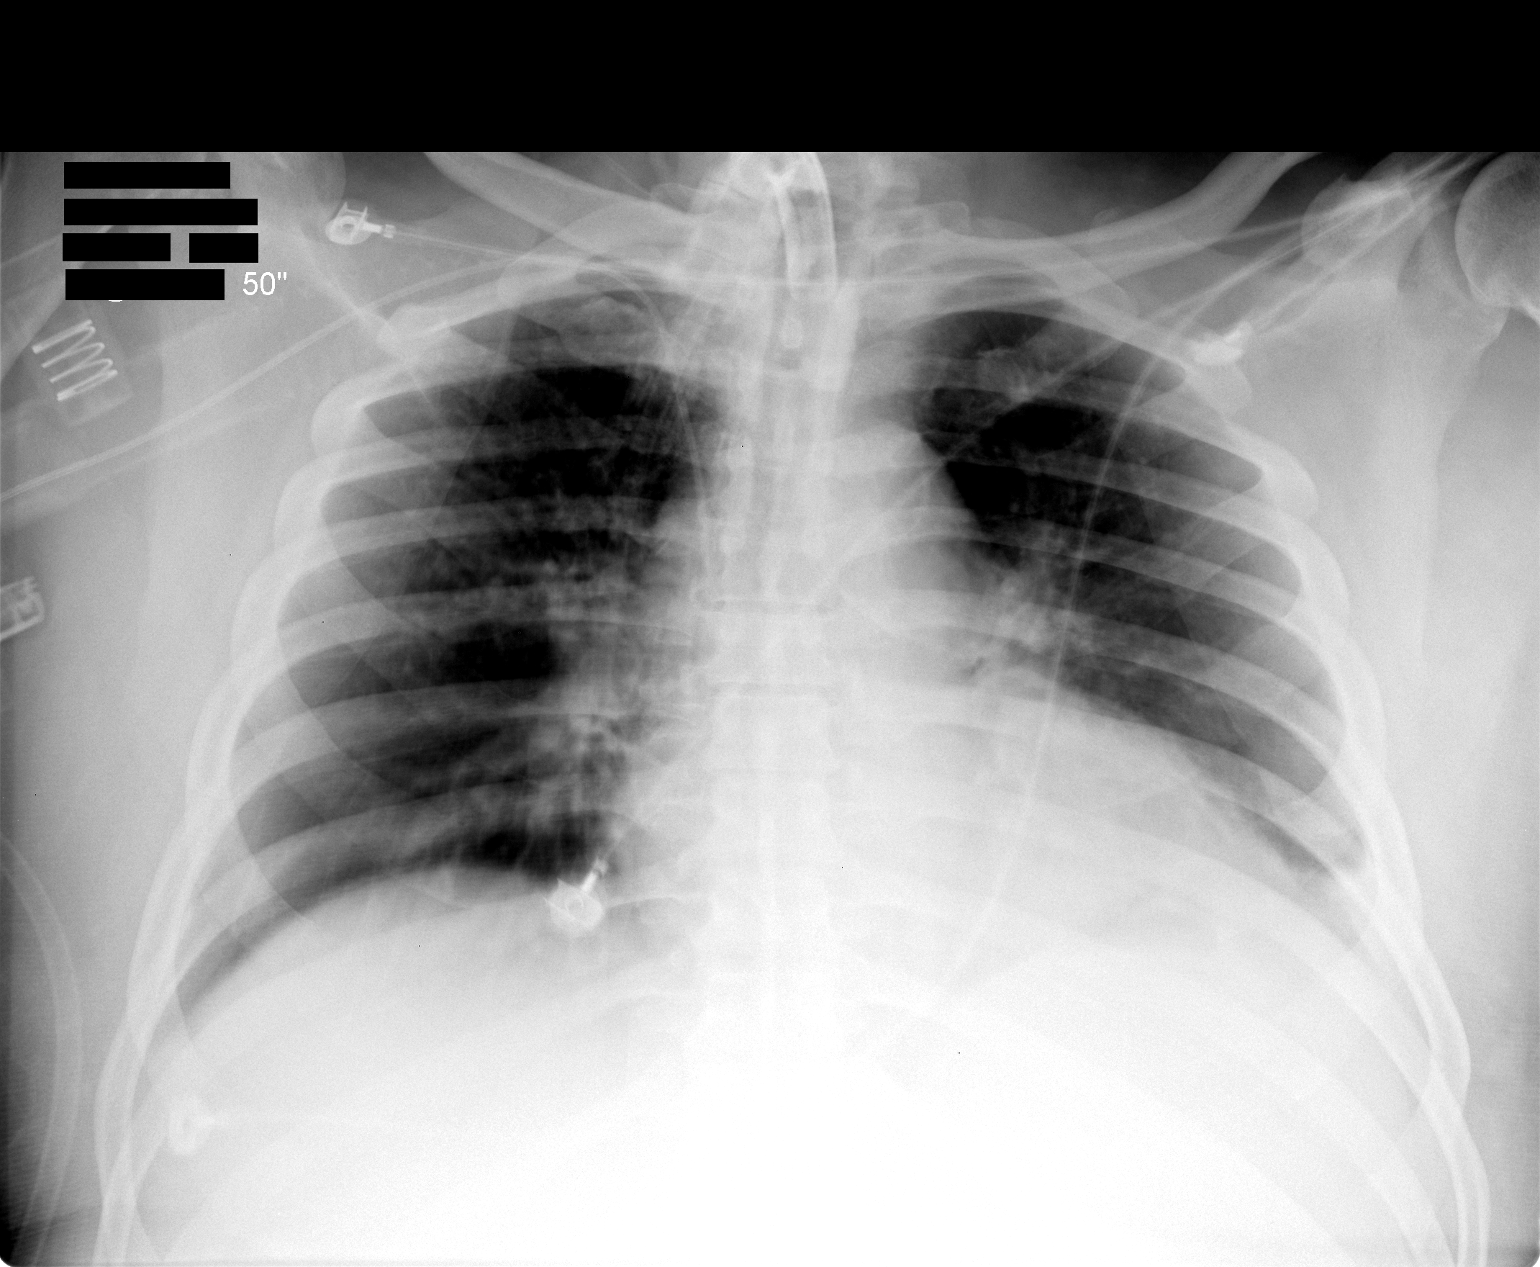

[1 of 1 positions shown; findings below may reference images not displayed]

IMPRESSION: Improved aeration in the bases with questionable vascular congestion particularly on the right.

## 2006-08-21 IMAGING — CR DG CHEST 1V PORT
1 series · 1 of 1 positions shown · non-contrast
Comparison: 11/25/2004

CLINICAL DATA: CHF, renal failure

PORTABLE CHEST - 1 VIEW:

[view not recorded]
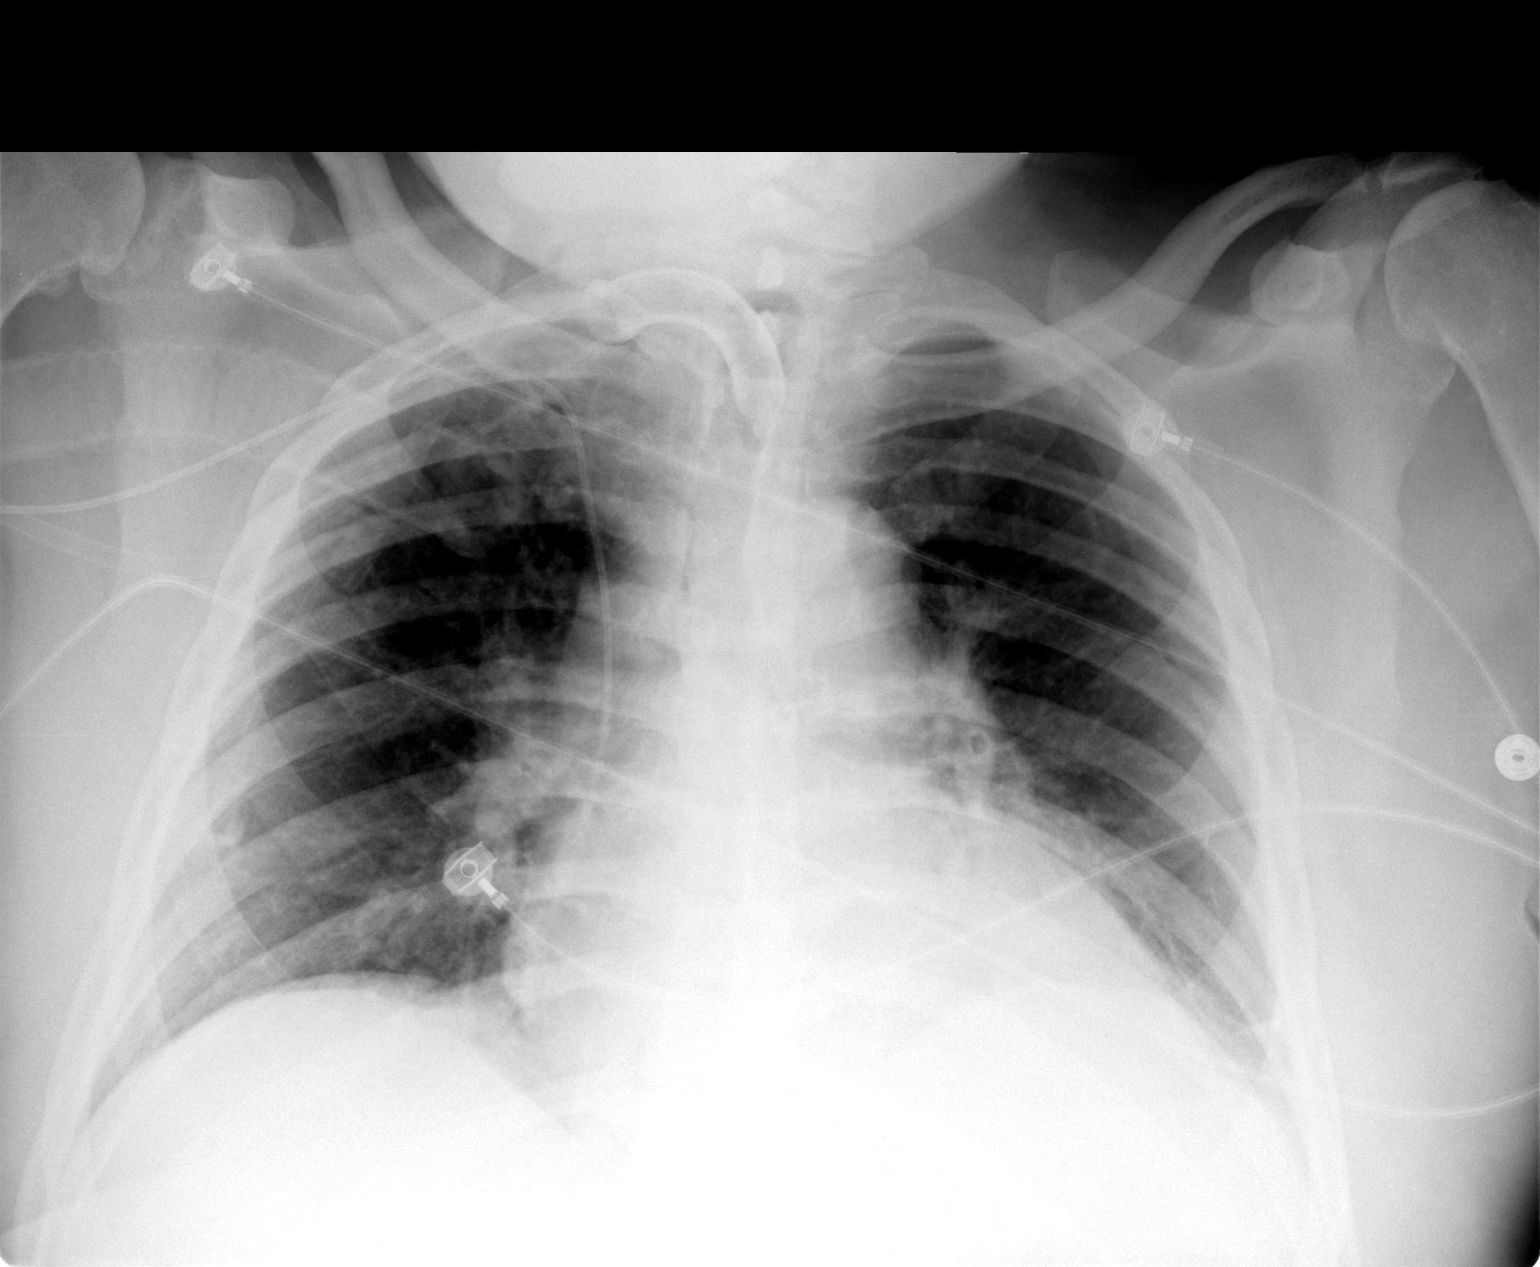

[1 of 1 positions shown; findings below may reference images not displayed]

FINDINGS: Stable cardiomegaly. Decreasing vascular congestion. Decreasing
bibasilar atelectasis.
IMPRESSION: Decreasing vascular congestion and bibasilar atelectasis.

## 2006-08-23 IMAGING — CR DG CHEST 1V PORT
1 series · 1 of 1 positions shown · non-contrast
Comparison: 11/27/04.

CLINICAL DATA: Renal failure.  CHF.  Dyspnea.
 PORTABLE CHEST:

[view not recorded]
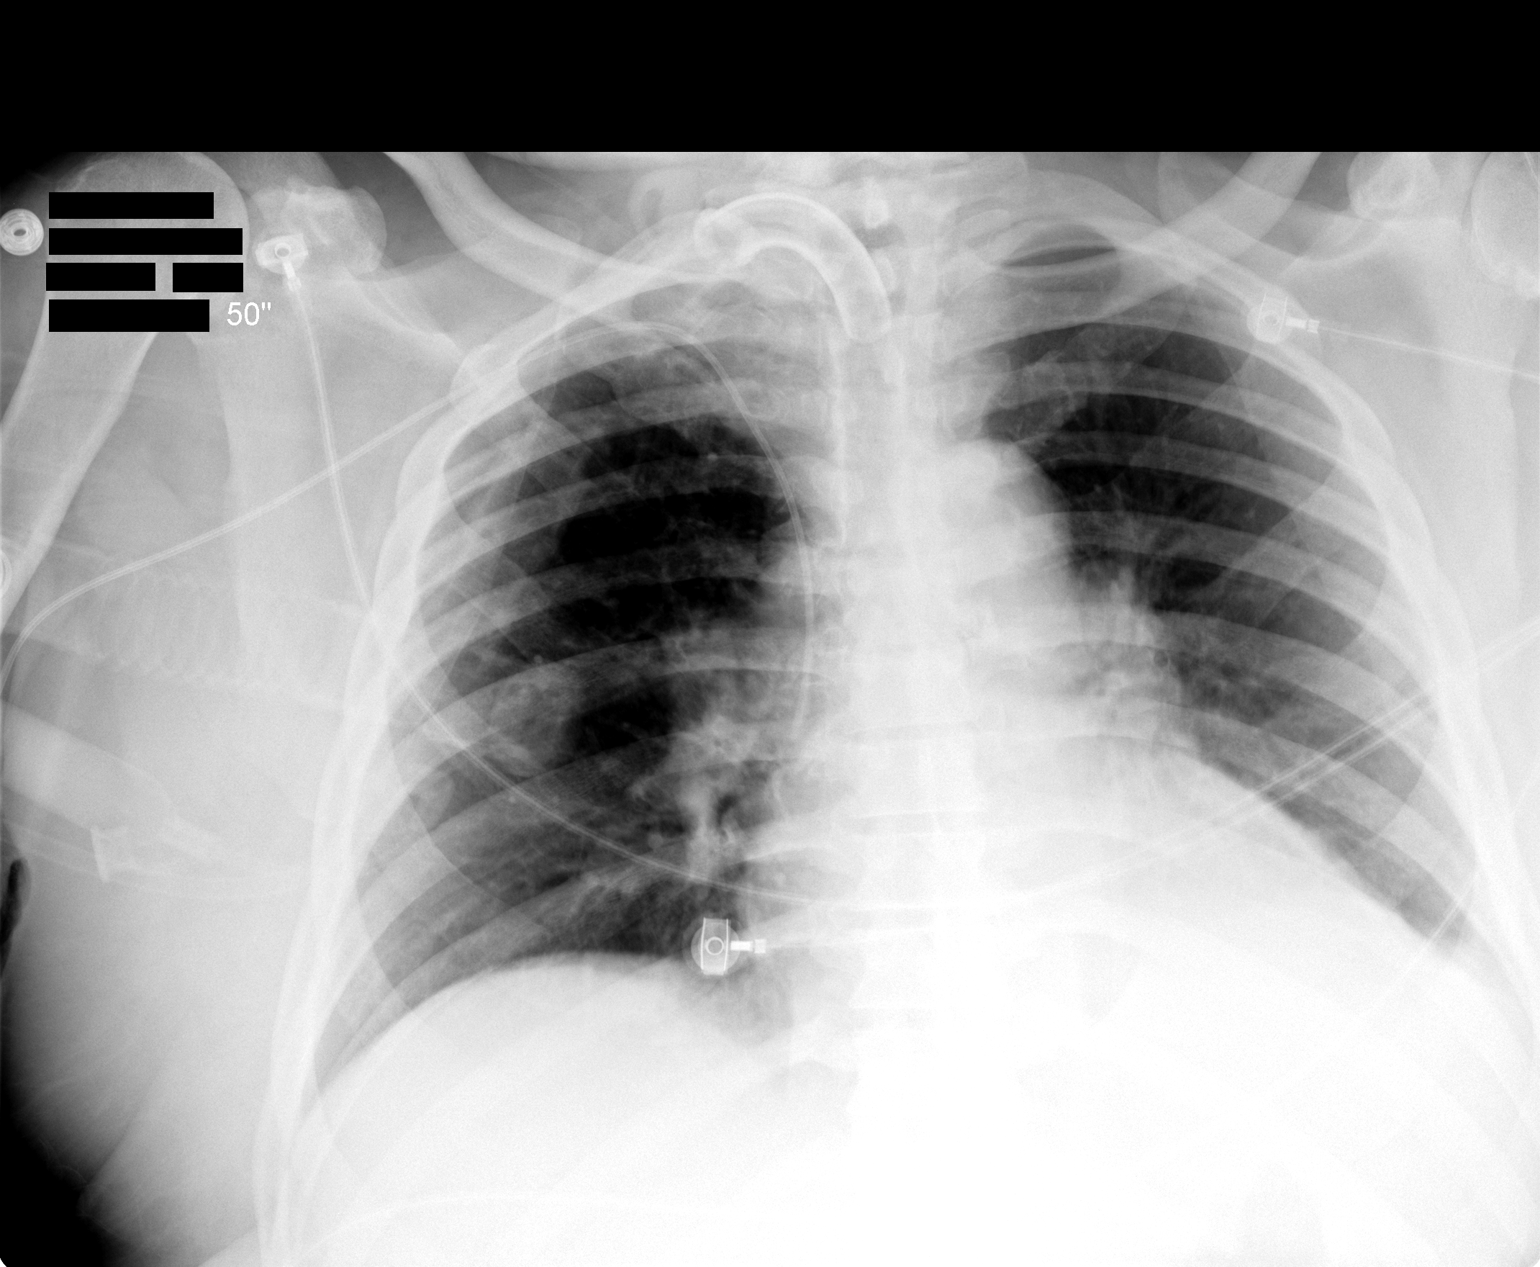

[1 of 1 positions shown; findings below may reference images not displayed]

Cardiomegaly and mild pulmonary vascular congestion, tracheostomy tube, right sided PICC line, and left lower lung atelectasis/consolidation noted.  There has been no interval change since last study.
IMPRESSION: Stable chest.

## 2006-08-25 IMAGING — CR DG CHEST 1V PORT
1 series · 1 of 1 positions shown · non-contrast
Comparison: 11/29/04
 Cardiomegaly, mild vascular congestion, tracheostomy tube, right PICC line, and left basilar atelectasis/consolidation are stable.

CLINICAL DATA: acute renal failure, CHF 
 PORTABLE CHEST ?1 VIEW:

[view not recorded]
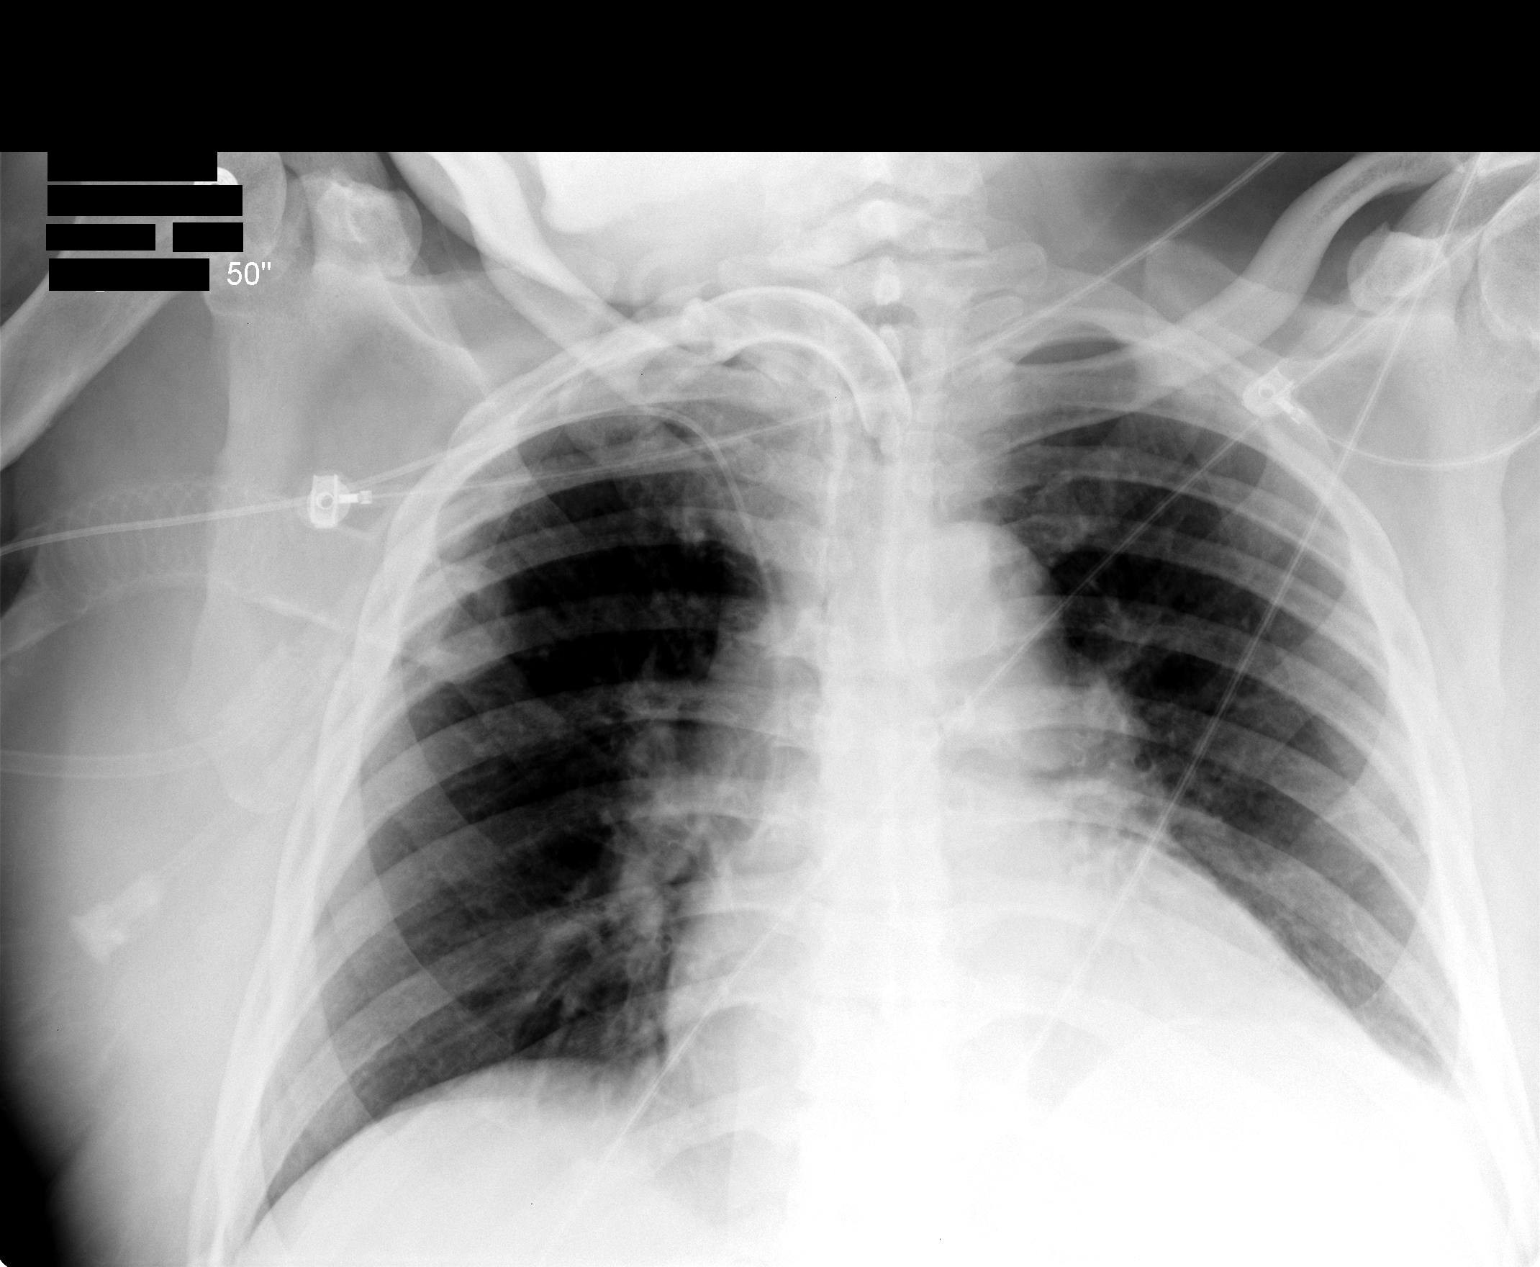

[1 of 1 positions shown; findings below may reference images not displayed]

IMPRESSION: Stable chest.

## 2006-08-30 IMAGING — CR DG CHEST 1V PORT
1 series · 1 of 1 positions shown · non-contrast
Comparison: none

CLINICAL DATA: CHF.
 PORTABLE CHEST ? 12/06/04 ? [DATE] HOURS:

[view not recorded]
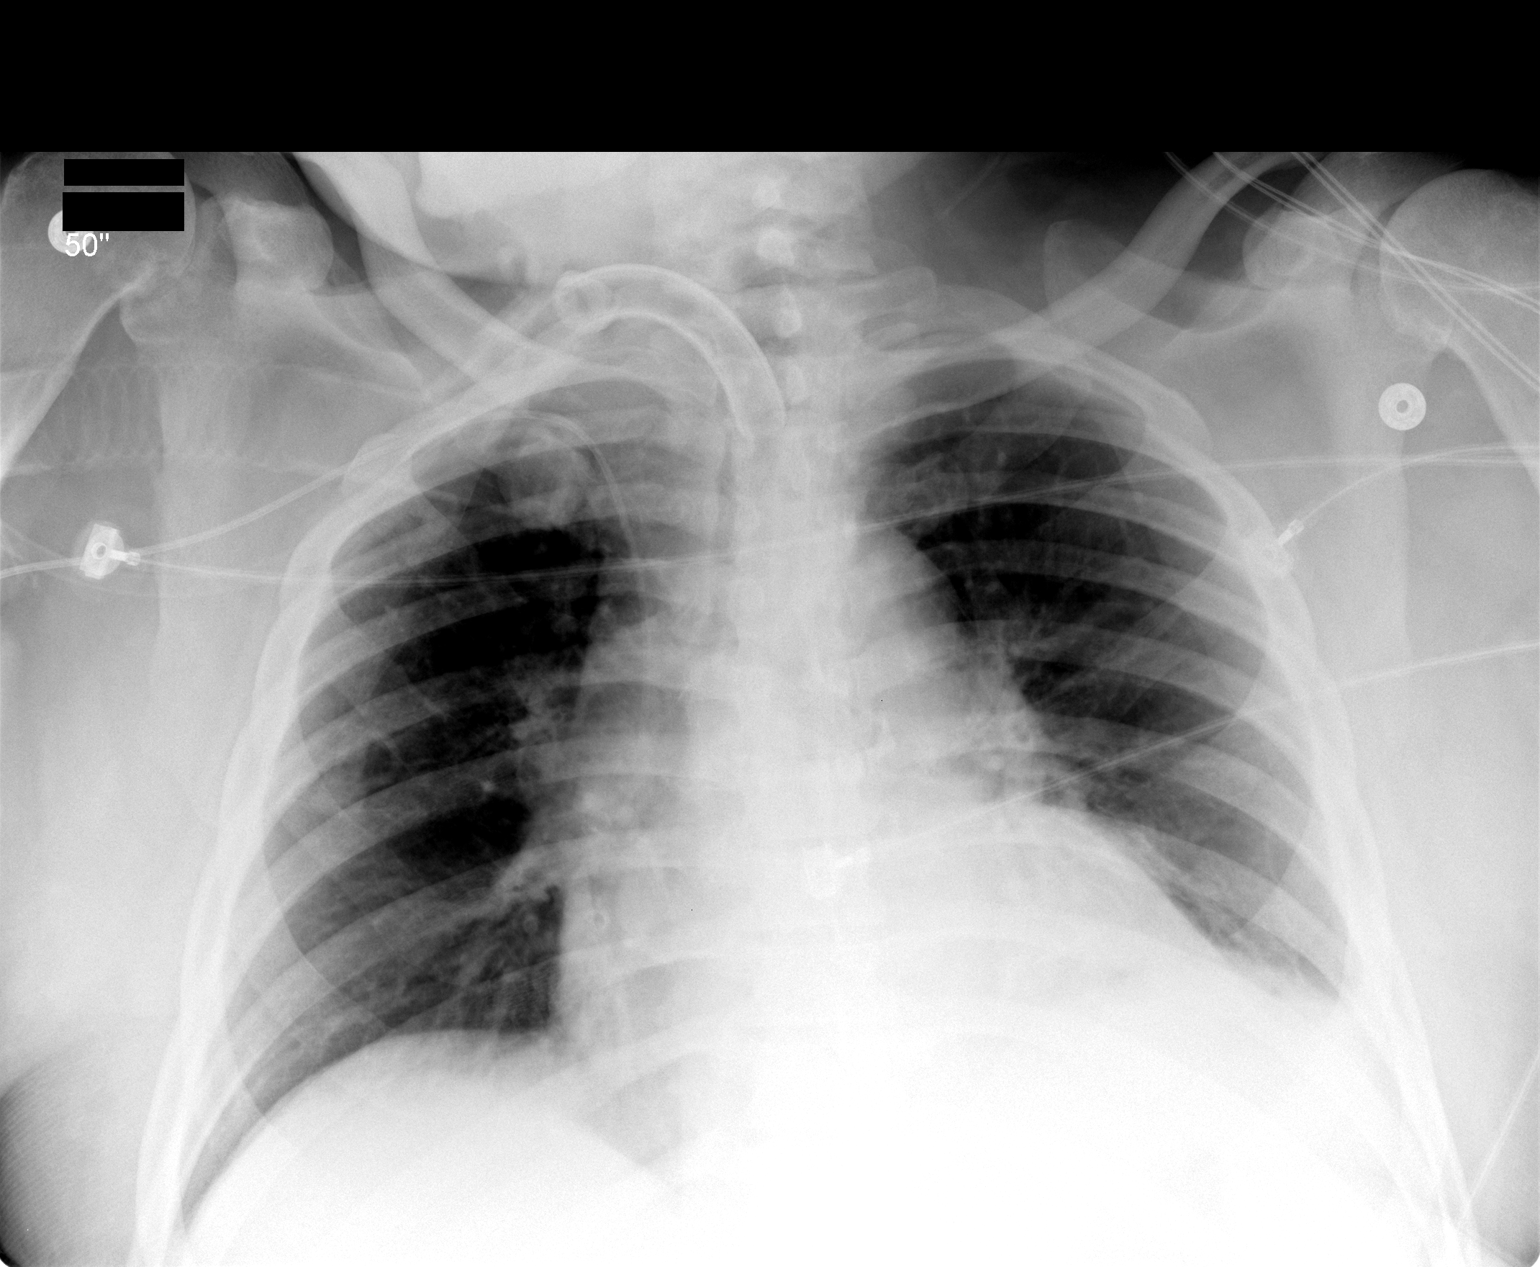

[1 of 1 positions shown; findings below may reference images not displayed]

FINDINGS: The tracheostomy tube and right upper extremity PICC are stable.  Left lower lobe atelectasis versus airspace disease has worsened.  The lungs are otherwise clear.  No pneumothoraces are seen.
IMPRESSION: Worsening left lower lobe atelectasis versus airspace disease.

## 2006-09-04 IMAGING — CR DG CHEST 1V PORT
1 series · 1 of 1 positions shown · non-contrast
Comparison: none

HISTORY: Acute renal failure, followup

PORTABLE CHEST ONE VIEW:
Portable exam 0200 hours compared to 12/08/2004.
Tracheostomy tube and right arm PICC line stable.
Mild cardiac prominence.
Bronchitic changes with persistent left lower lobe opacity.
Remaining lungs clear.

[view not recorded]
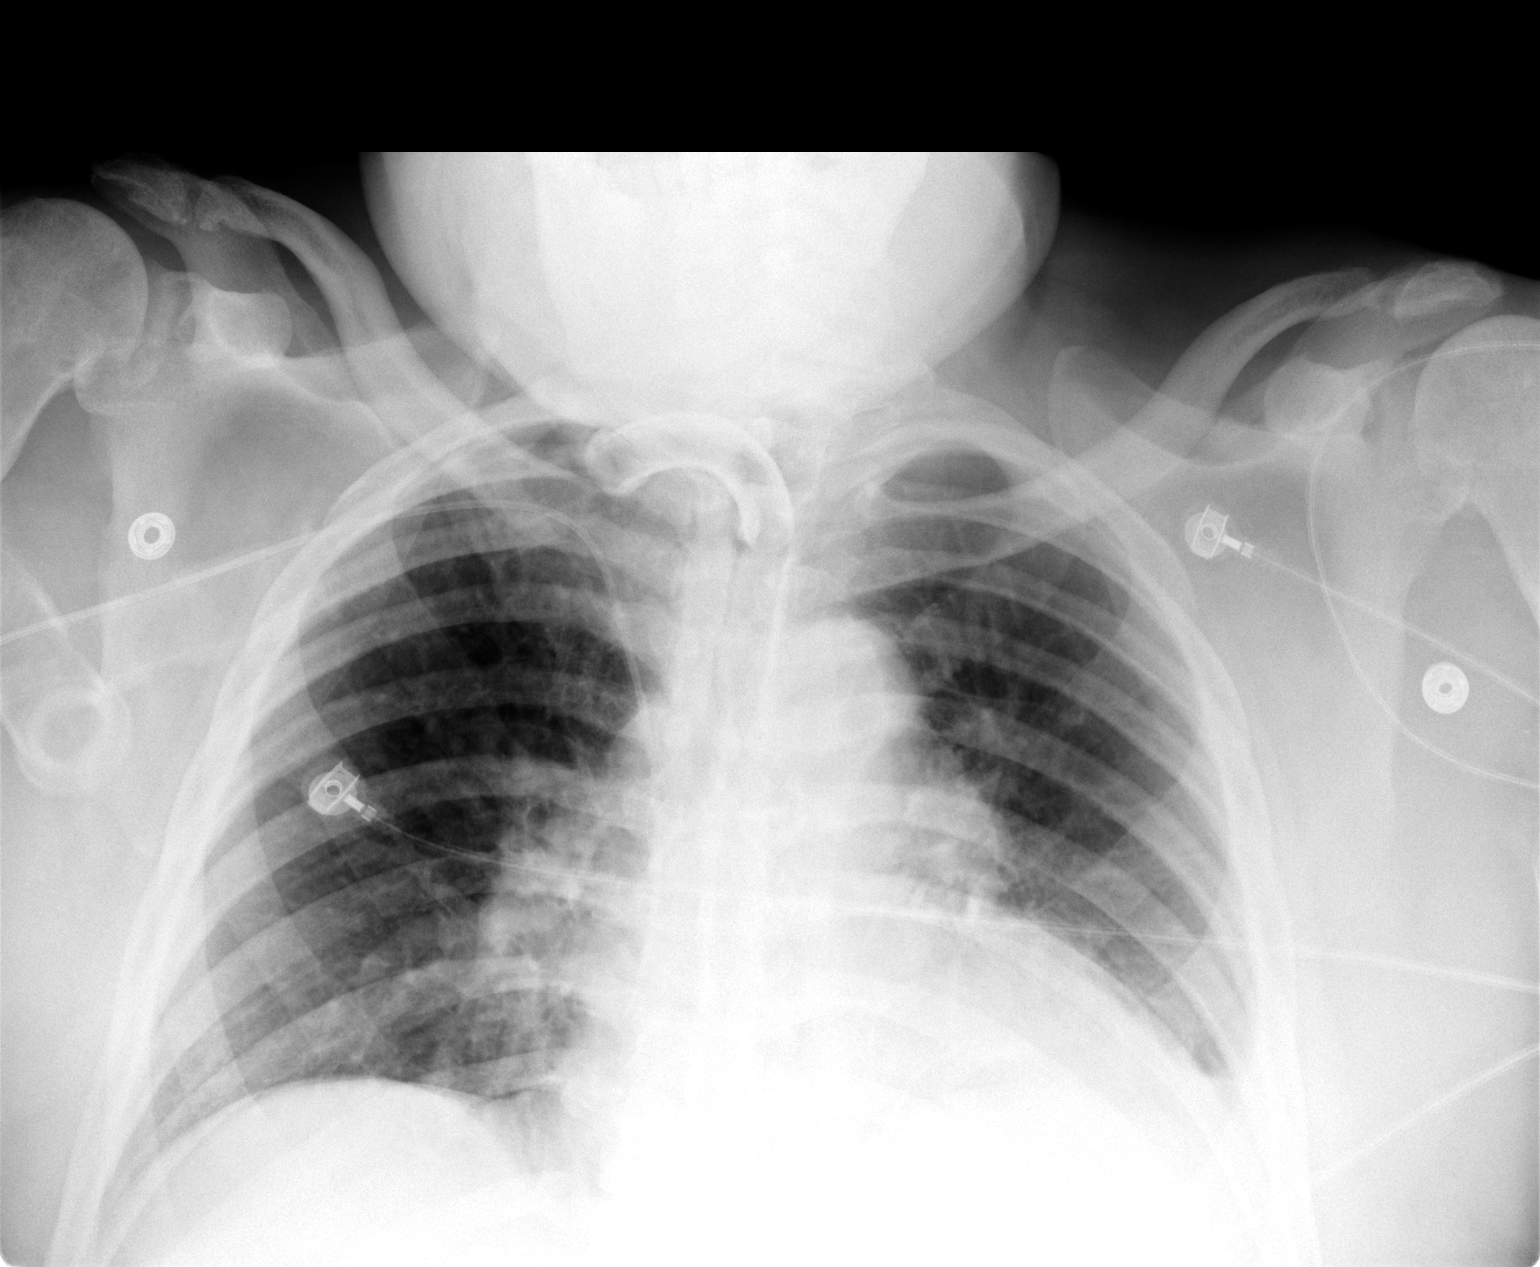

[1 of 1 positions shown; findings below may reference images not displayed]

IMPRESSION: Persistent left lower lobe atelectasis versus infiltrate.

## 2006-09-06 IMAGING — CR DG CHEST 1V PORT
1 series · 1 of 1 positions shown · non-contrast
Comparison: none

CLINICAL DATA: Acute renal failure, congestive heart failure, on ventilator.  
 PORTABLE CHEST - 1 VIEW - 12/13/04: 
 Comparison to 12/11/04.
 The tracheostomy tube, right PICC line, mild peribronchial thickening, and cardiomegaly are all stable.  Slightly decreased lung volumes with slight increase in bibasilar atelectasis.  No other change is noted.

[view not recorded]
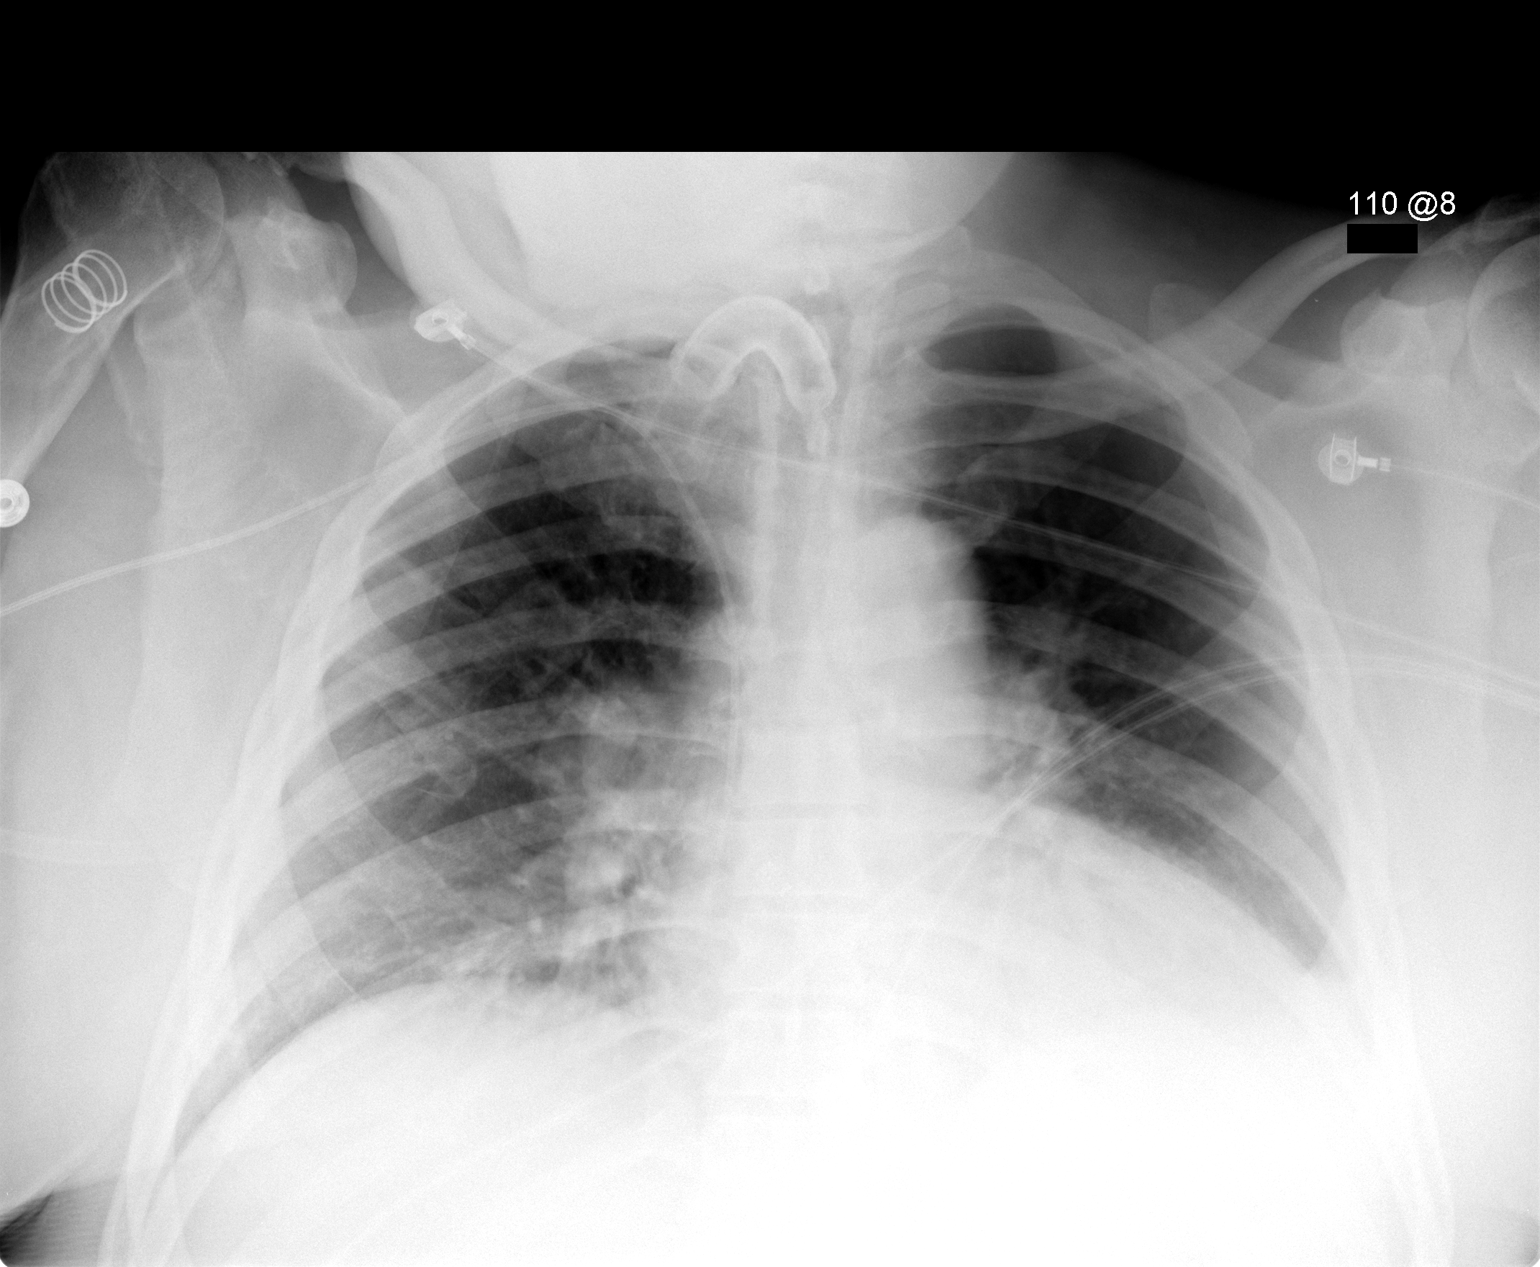

[1 of 1 positions shown; findings below may reference images not displayed]

IMPRESSION: Slightly decreased lung volumes with mild increasing bibasilar atelectasis.

## 2006-09-11 IMAGING — CR DG CHEST 1V PORT
1 series · 1 of 1 positions shown · non-contrast
Comparison: Portable chest x-ray 12/13/2004.

CLINICAL DATA: Ventilator dependent respiratory failure. Followup bibasilar
atelectasis versus pneumonia.

PORTABLE CHEST - 1 VIEW  [DATE]/3110 1641 hours:

[view not recorded]
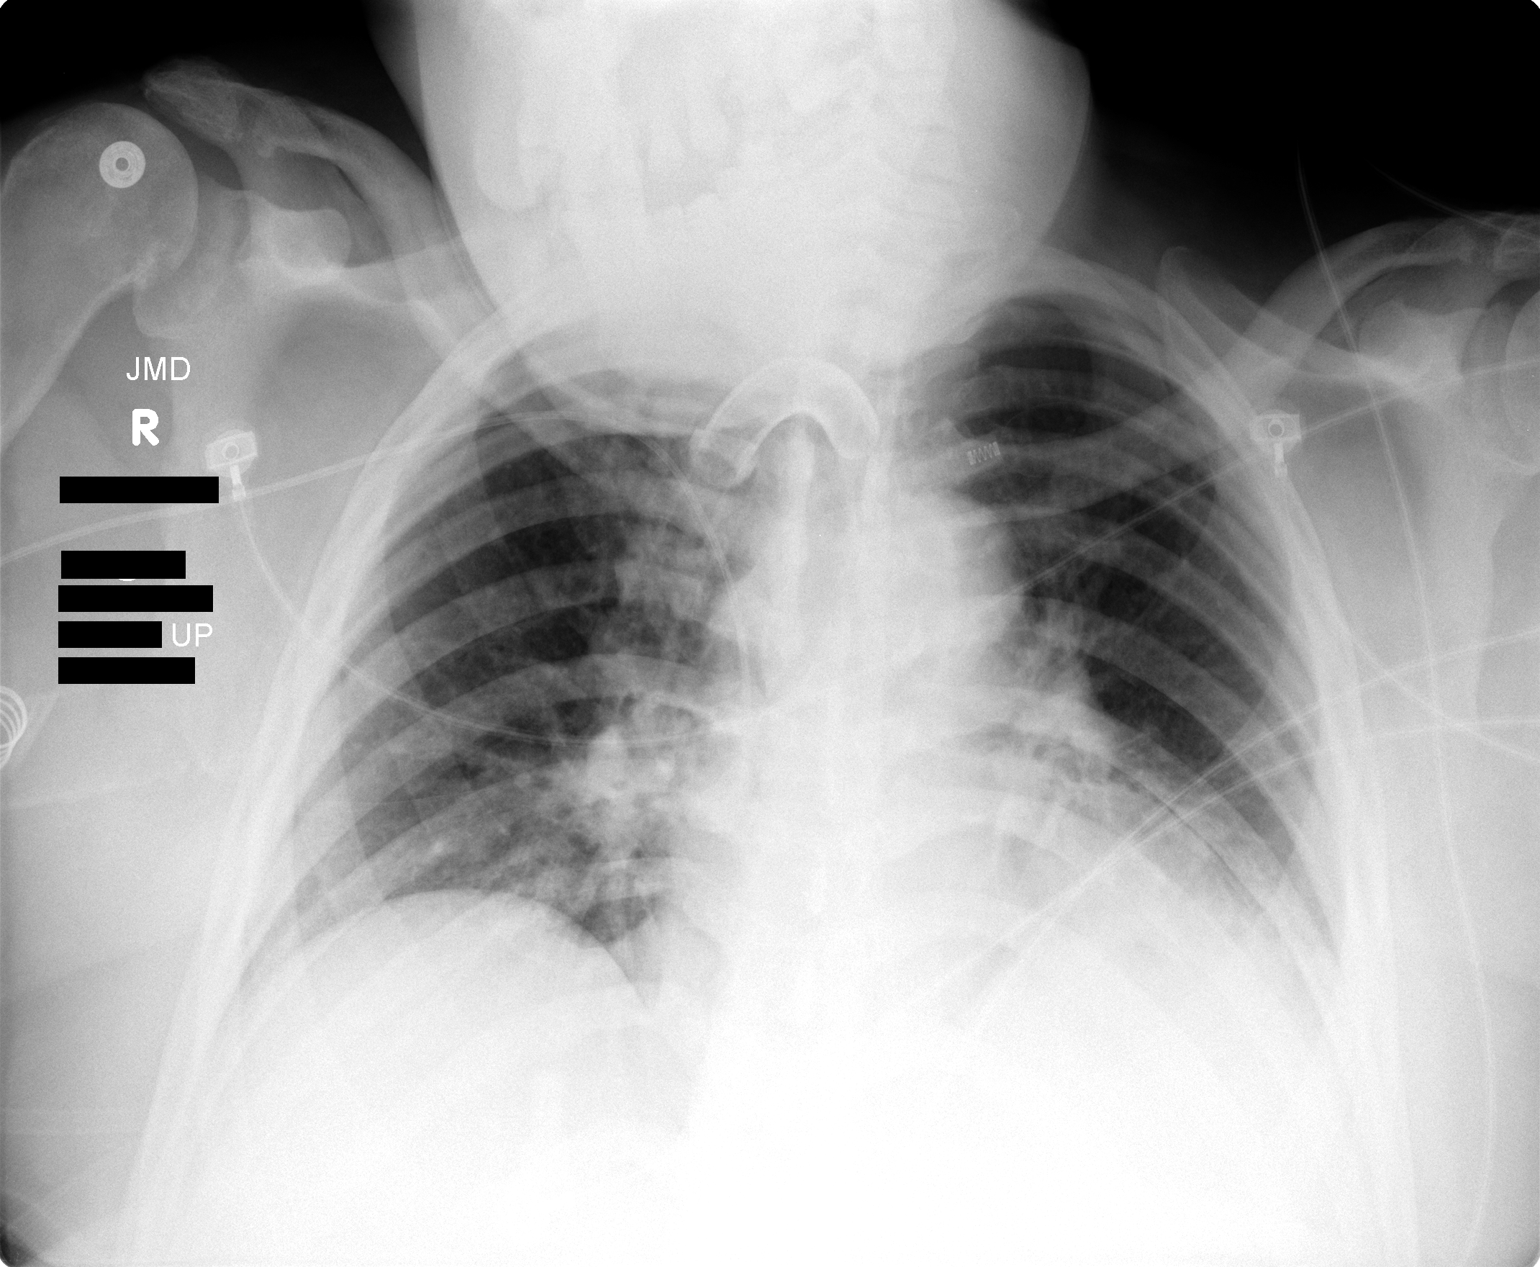

[1 of 1 positions shown; findings below may reference images not displayed]

FINDINGS: Dense consolidation in the left lower lobe has worsened. There has
been improvement in the aeration in the right lung base. No new pulmonary
abnormalities are identified. The tracheostomy tube tip remains in satisfactory
position below the thoracic inlet. The right arm PICC tip remains in the lower
SVC.
IMPRESSION: Worsening left lower lobe atelectasis versus pneumonia. Improved aeration in the
right lower lobe.

## 2006-09-13 IMAGING — CR DG CHEST 1V PORT
1 series · 1 of 1 positions shown · non-contrast
Comparison: 12/18/04.

CLINICAL DATA: Acute renal failure; congestive heart failure 
 PORTABLE CHEST - 1 VIEW:

[view not recorded]
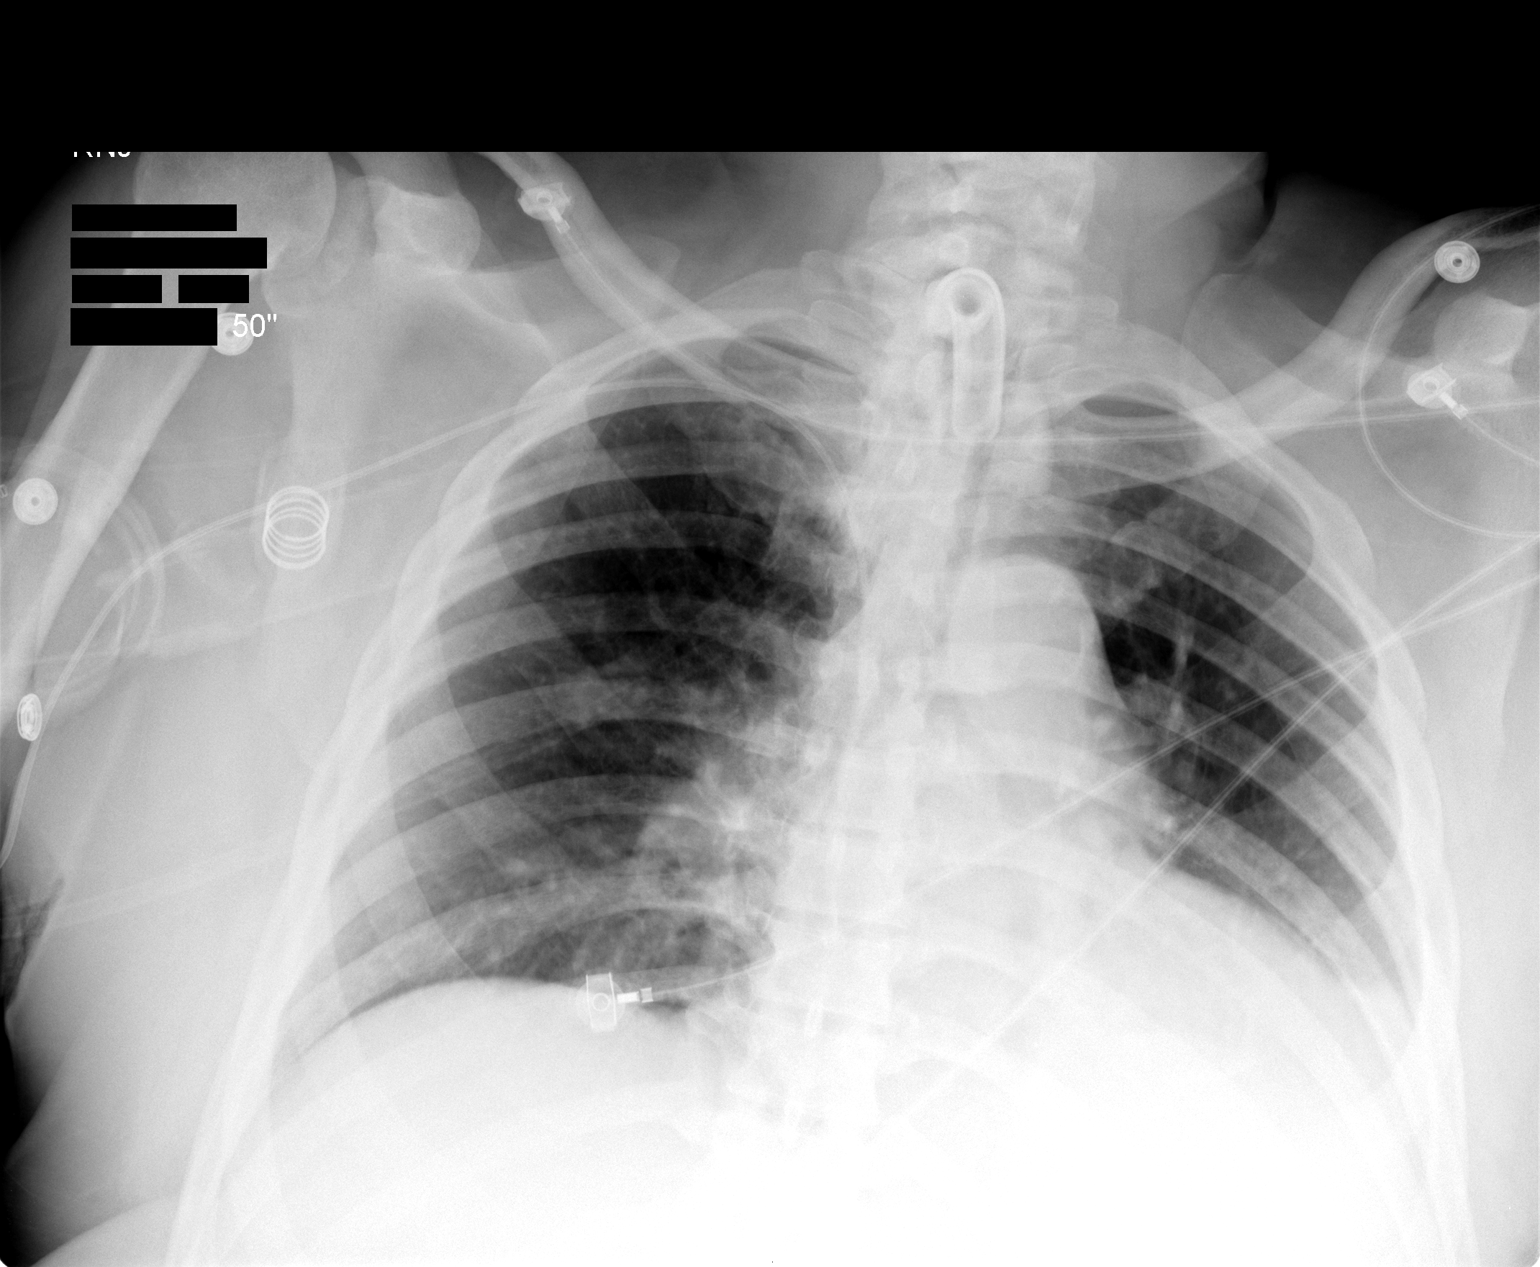

[1 of 1 positions shown; findings below may reference images not displayed]

There is persistent retrocardiac air space opacity obscuring the left hemidiaphragm.  Right sided central line is present with its tip in the superior vena cava.  Tracheostomy tube remains in place. 
 There is mild perihilar atelectasis on the right.
IMPRESSION: Stable radiographic appearance of the chest.

## 2006-09-13 IMAGING — RF DG ABDOMEN 1V
1 series · 2 of 2 positions shown · non-contrast
Comparison: none

CLINICAL DATA: Acute renal failure.  Request to place Panda tube at bedside. 
FLUOR RM 1-60 MIN:
Portably fluoroscopic guided placement of Panda tube.
Panda tube was inserted into the right nostril and easily advanced into the esophagus and distal portion of the stomach.  An Amplatz superstiff guidewire was used to further advance.  Despite numerous attempts to place this Panda tube within the duodenum I was unable to do so.  Contrast was injected which confirmed positioning within the distal portion of the stomach.

[Series 1: run · 2 of 2 slices shown]
[im 1/2]
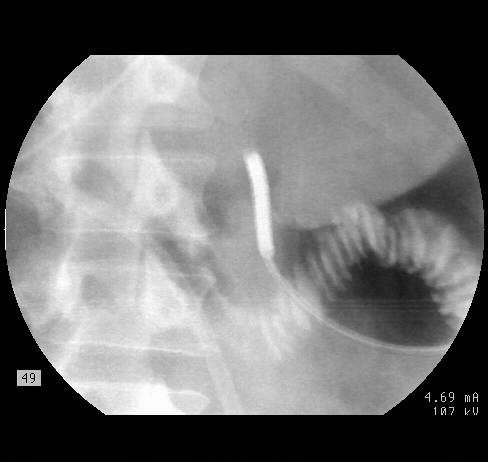
[im 2/2]
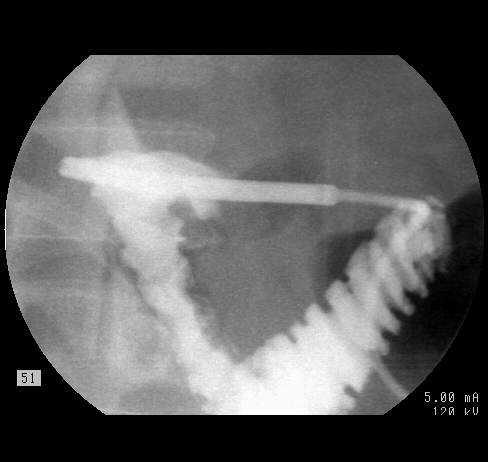

[2 of 2 positions shown; findings below may reference images not displayed]

IMPRESSION: Placement of a Panda tube via the right nostril into the distal portion of the stomach.  Unable to further advance.

## 2006-09-14 IMAGING — CR DG ABD PORTABLE 1V
1 series · 1 of 1 positions shown · non-contrast
Comparison: none

CLINICAL DATA: Feeding tube placement.
 ABDOMEN 1 VIEW PORTABLE:
 Comparison 12/20/04.
 Supine abdomen at 7022 hours shows the tip of the feeding tube in the antropyloric region of the stomach, possibly just into the duodenal bulb.  Paucity of bowel gas noted.

[view not recorded]
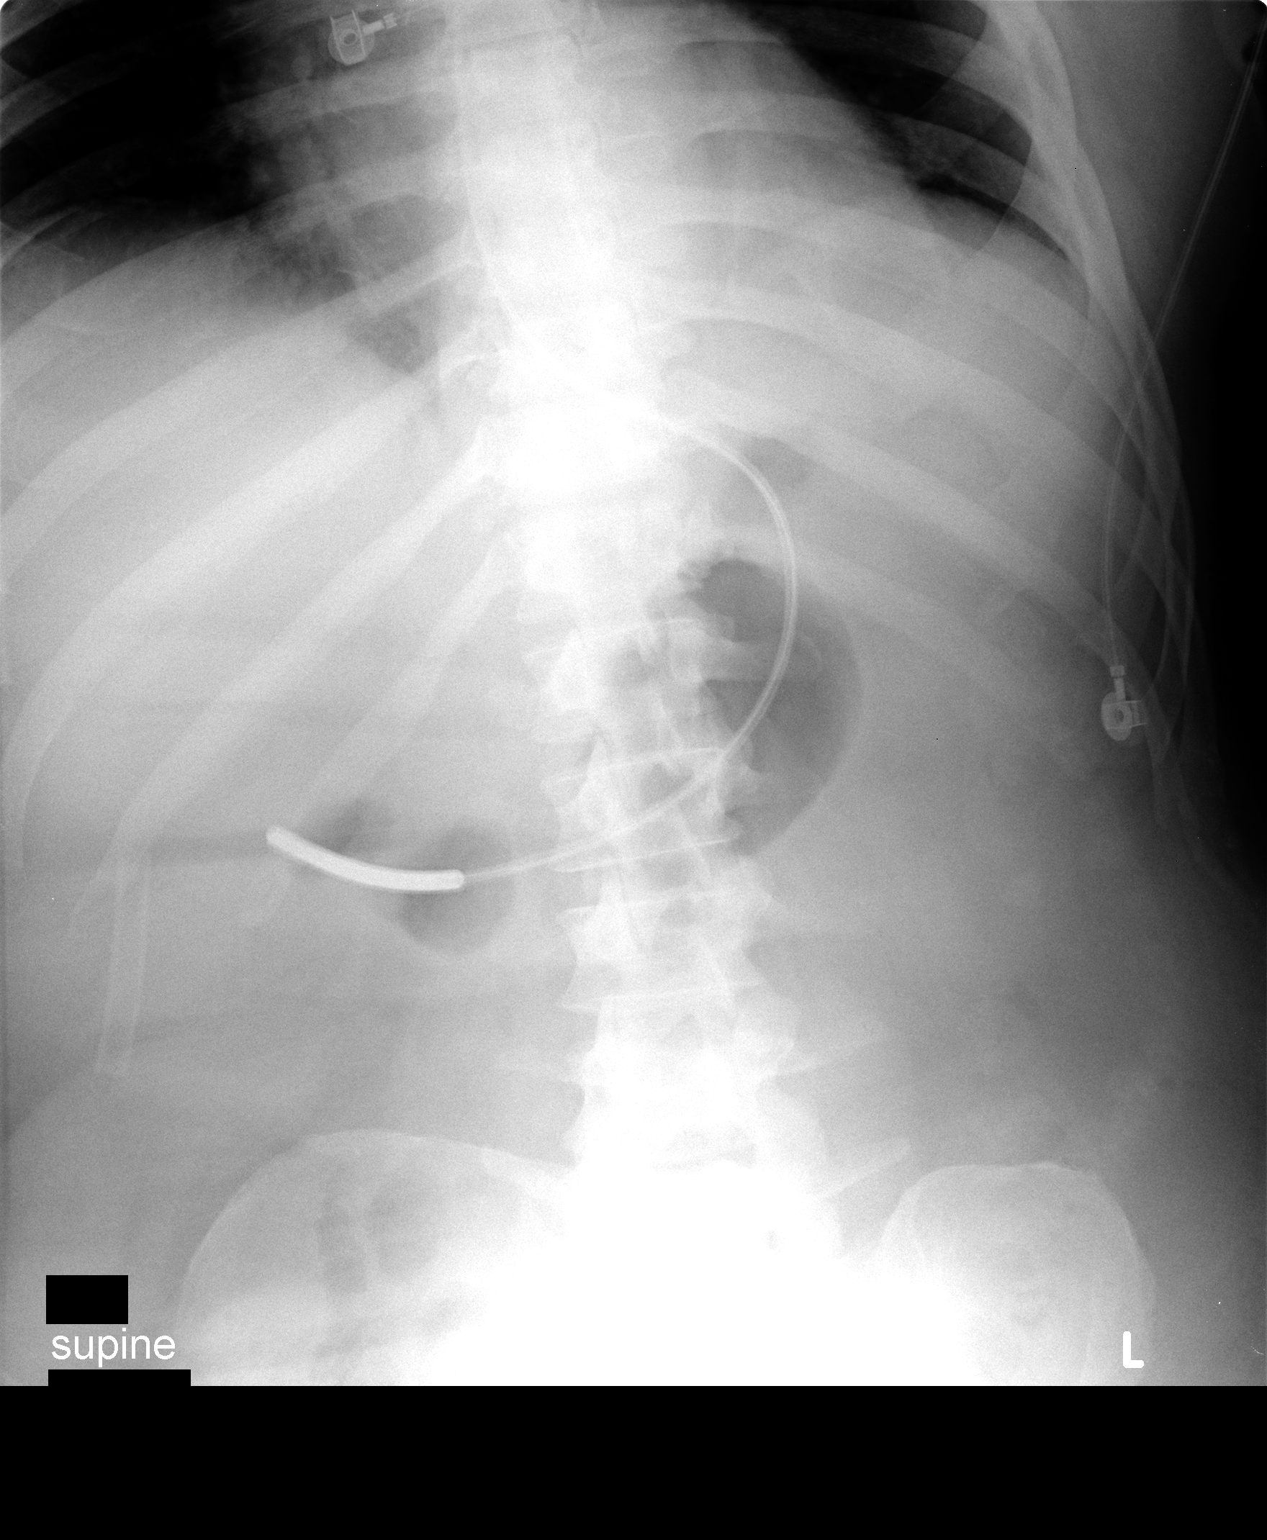

[1 of 1 positions shown; findings below may reference images not displayed]

IMPRESSION: Feeding tube tip in the antropyloric region of the stomach, possibly just into the duodenal bulb.

## 2006-09-15 IMAGING — CR DG CHEST 1V PORT
1 series · 1 of 1 positions shown · non-contrast
Comparison: 12/20/2004

CLINICAL DATA: Ventilator, acute renal failure

PORTABLE CHEST - 1 VIEW:

[view not recorded]
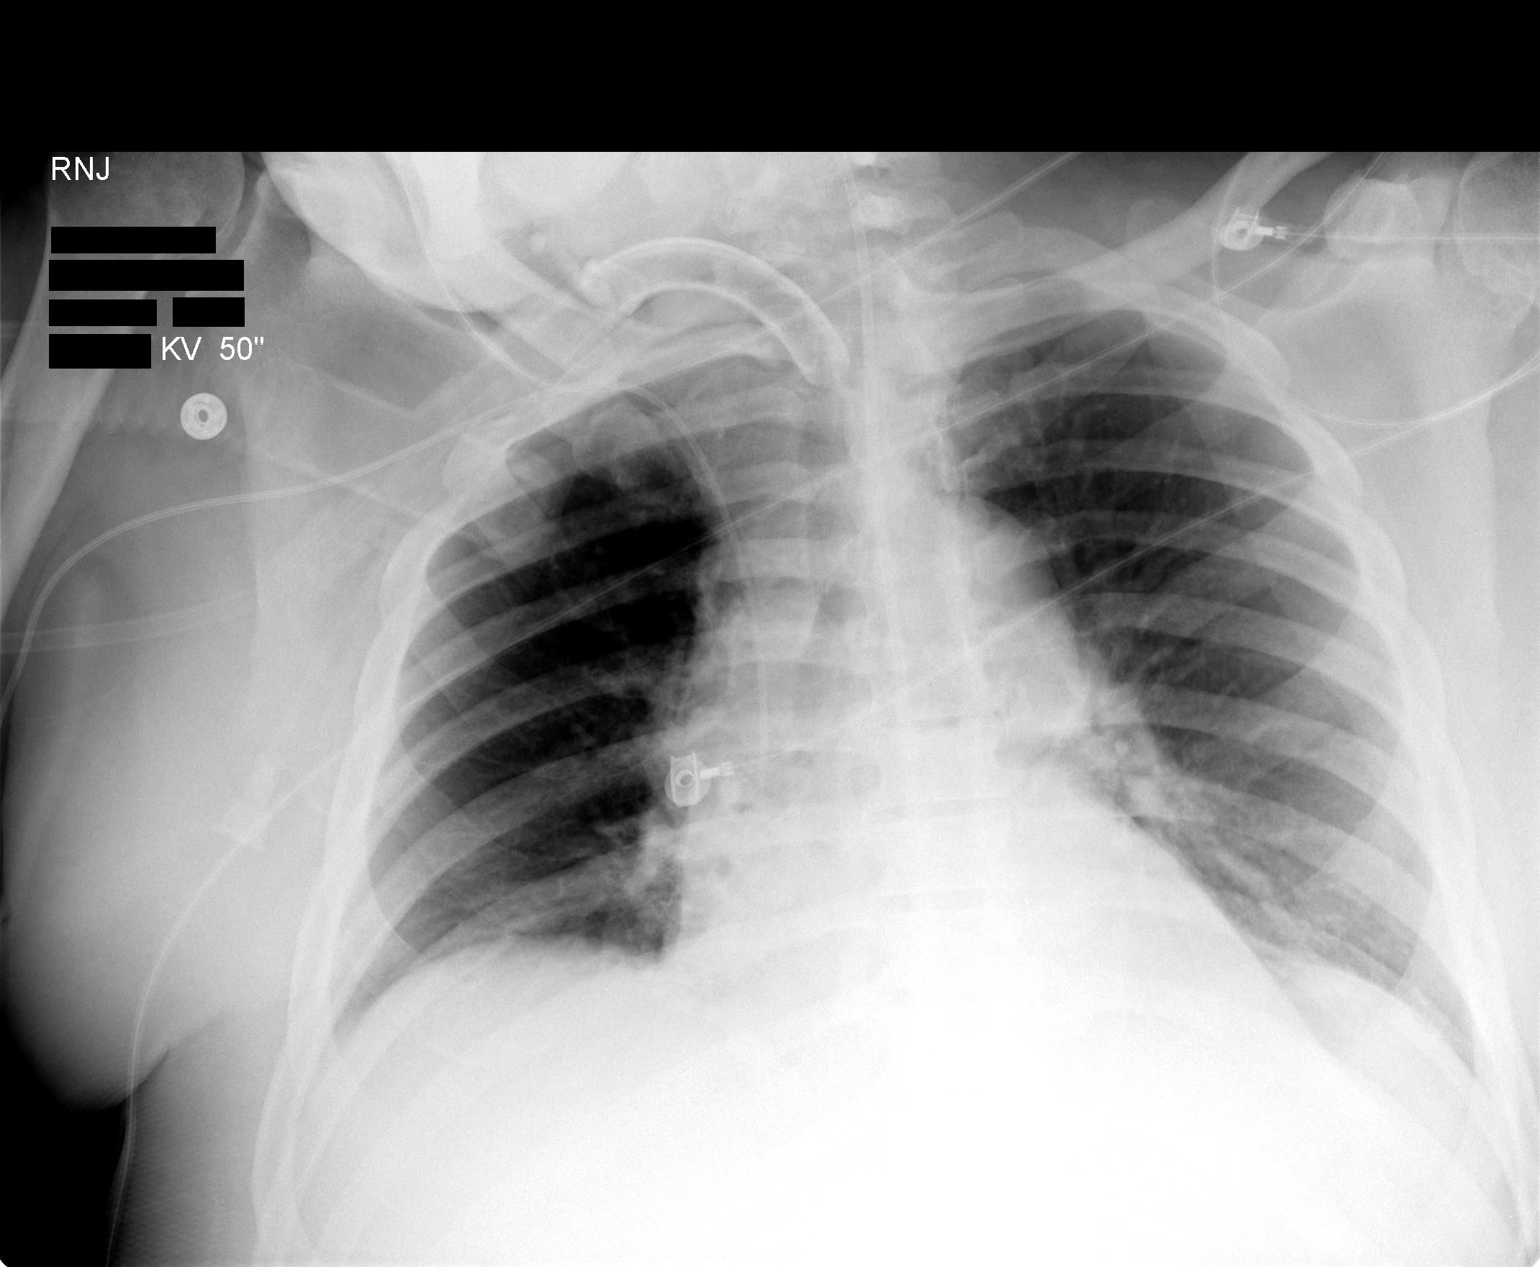

[1 of 1 positions shown; findings below may reference images not displayed]

FINDINGS: Support devices are unchanged. Bibasilar atelectasis again present,
slightly decreased in the left base since prior study.  Stable cardiomegaly.
IMPRESSION: Slight decrease left base atelectasis.

## 2006-09-16 IMAGING — CR DG ABD PORTABLE 1V
1 series · 1 of 1 positions shown · non-contrast
Comparison: none

CLINICAL DATA: Panda placement. 
 ABDOMEN ? 1 VIEW:
 A Panda tube is coiled with the tip being in the fundus of the stomach.

[view not recorded]
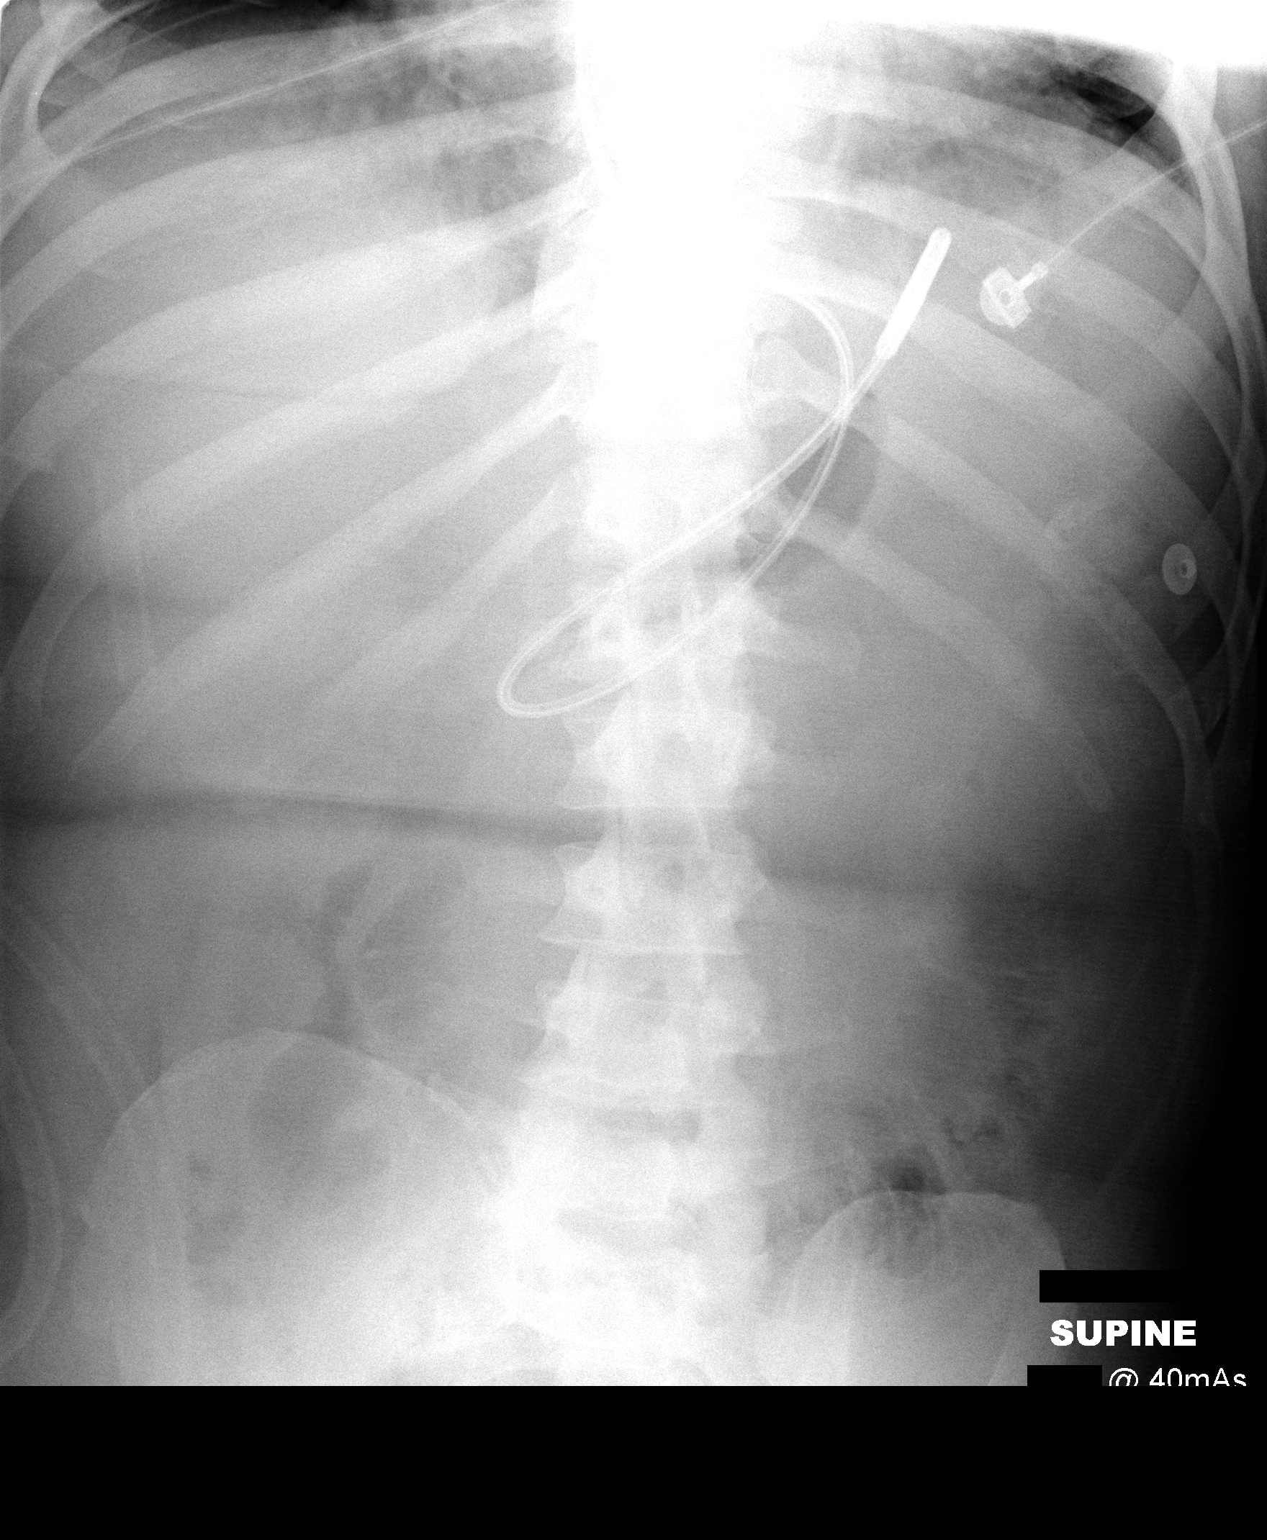

[1 of 1 positions shown; findings below may reference images not displayed]

IMPRESSION: Panda tube is coiled.

## 2006-09-17 IMAGING — US US ABDOMEN PORT
1 series · 13 of 25 positions shown · non-contrast
Comparison: none

CLINICAL DATA: Increased liver function tests.  Hernia with mesh repair and fistulization. Wound on abdomen. Vancomycin resistant enterococcus. 
PORTABLE ABDOMINAL ULTRASOUND:

[Series 1: abdomen · 0.33mm/px · 13 of 63 slices shown]
[im 1/63]
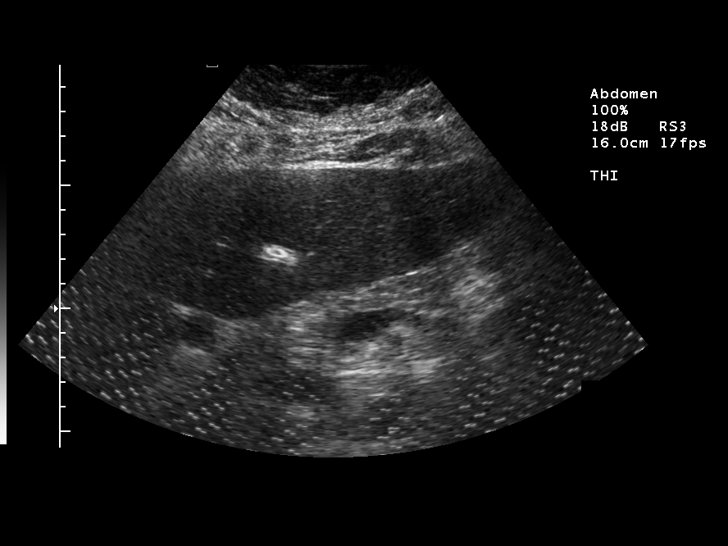
[im 6/63]
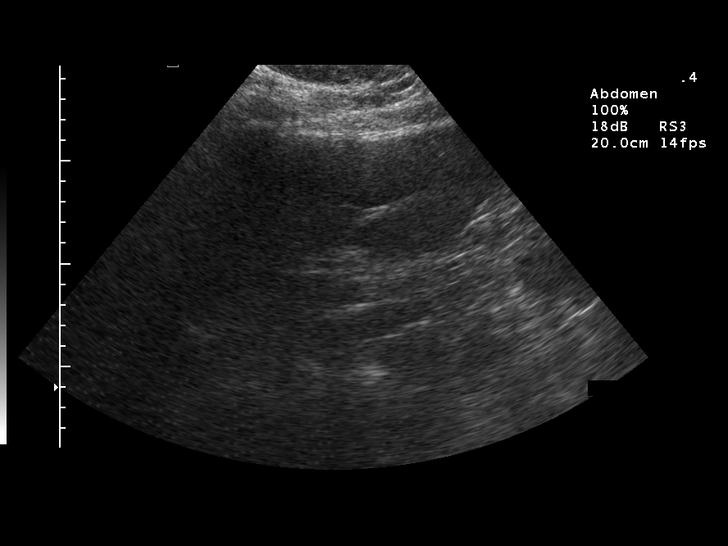
[im 11/63]
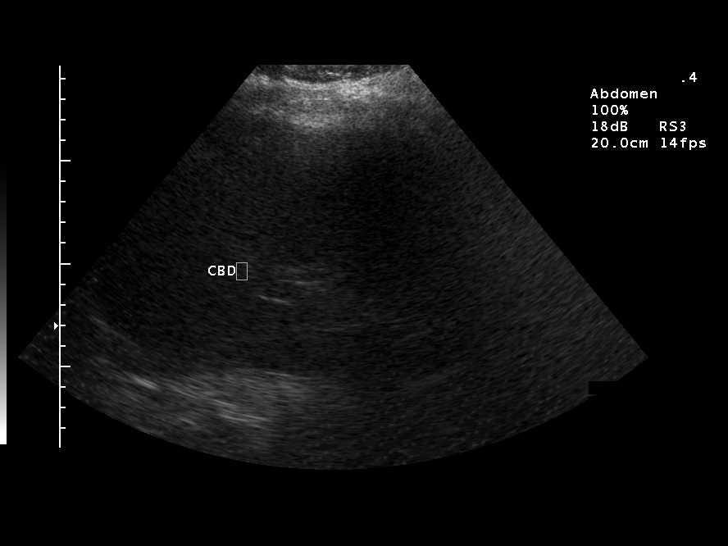
[im 16/63]
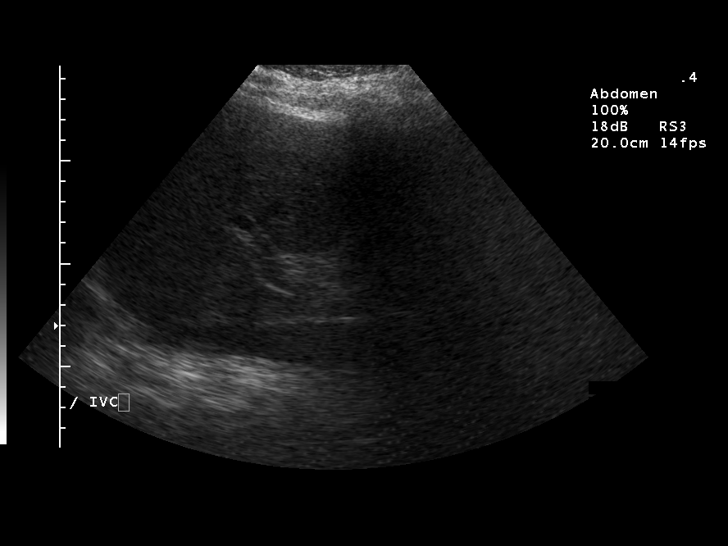
[im 21/63]
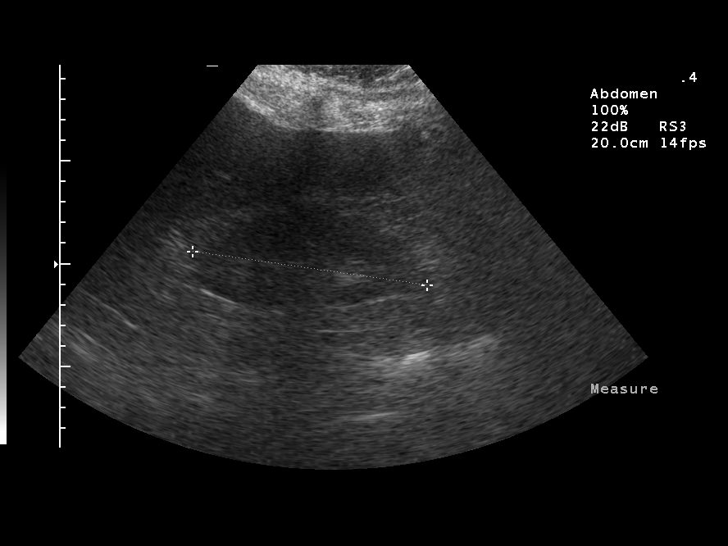
[im 26/63]
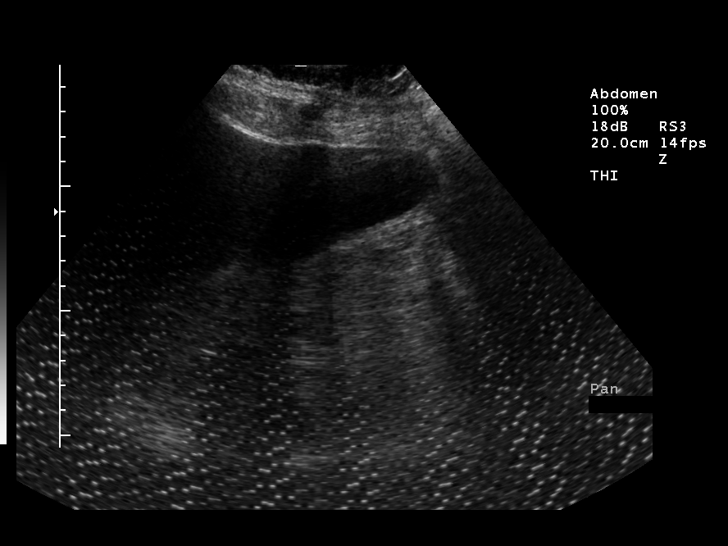
[im 32/63]
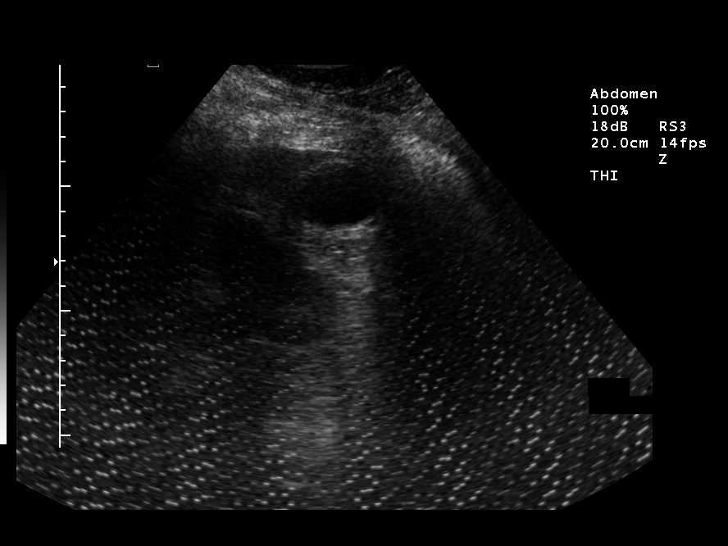
[im 37/63]
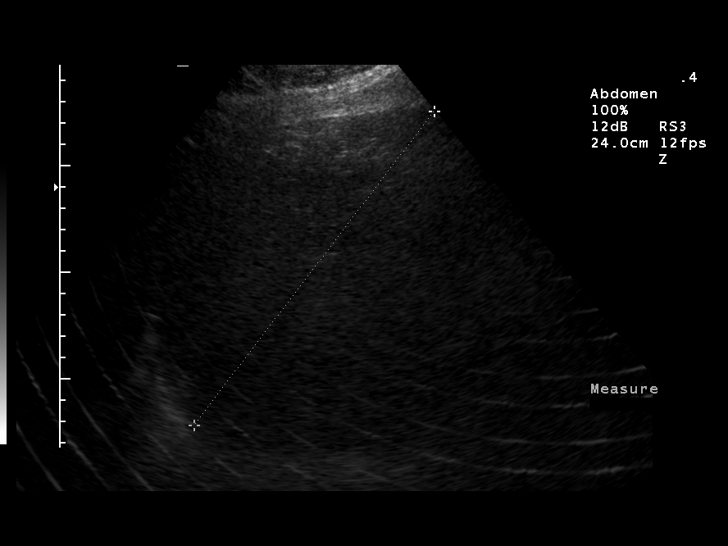
[im 42/63]
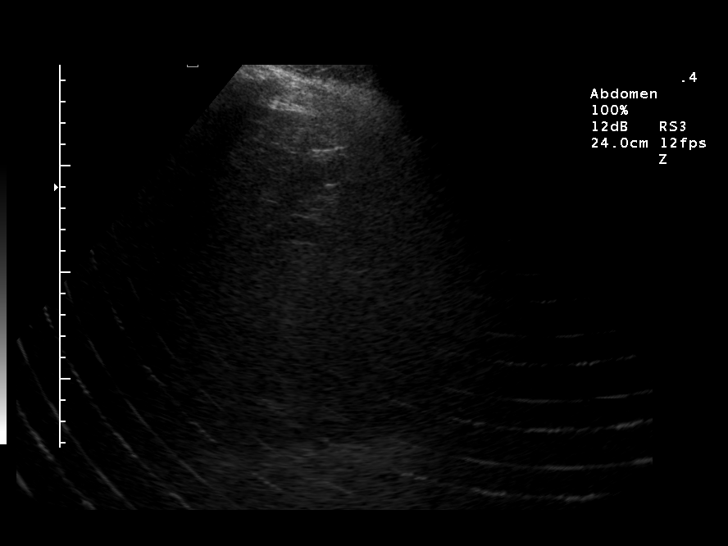
[im 47/63]
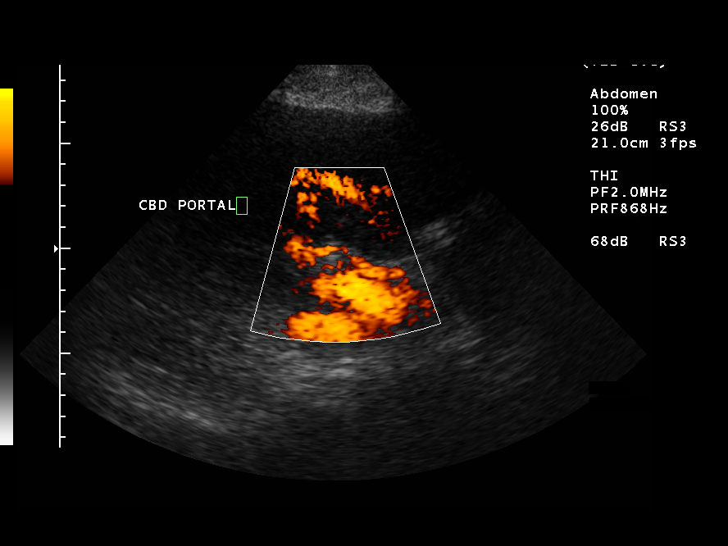
[im 52/63]
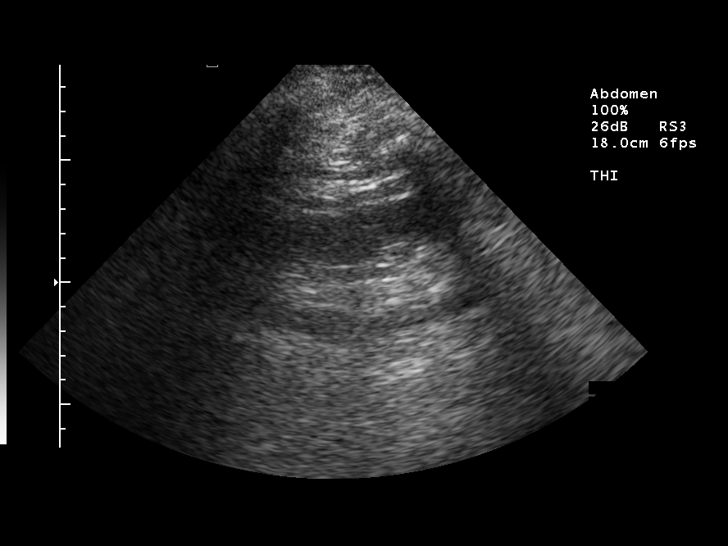
[im 57/63]
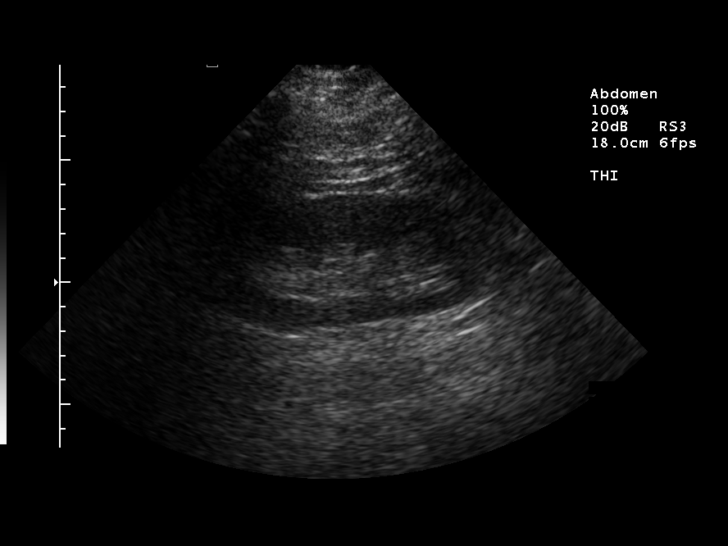
[im 63/63]
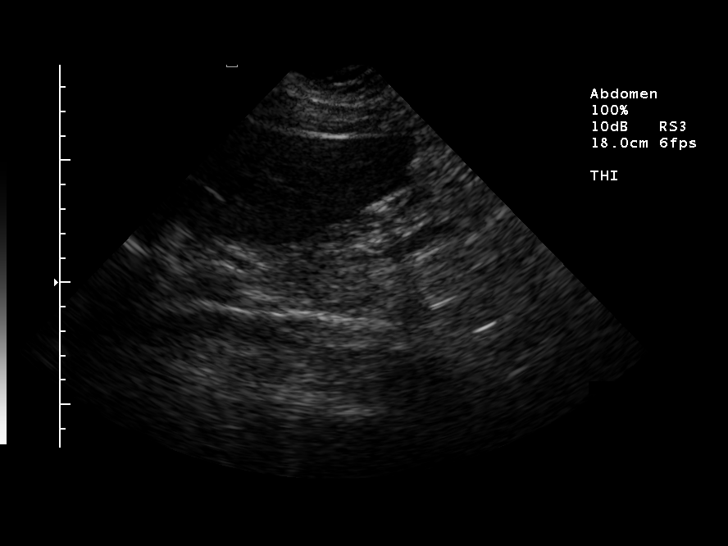

[13 of 25 positions shown; findings below may reference images not displayed]

FINDINGS: Today?s exam was limited due to patient body habitus and open abdominal wound and suction drainage.  Exam was performed portably.
The liver is mildly enlarged.  It is diffusely hypoechoic but we were unable to obtain ideal windows for evaluating the liver.  Sensitivity in assessing the liver is considered low on this exam. Craniocaudad liver measures 18.5 cm, and the spleen measures 18.7 cm consistent with splenomegaly.
The IVC appears unremarkable. No gallstones are identified.  Gallbladder wall is borderline thickened, which could be due to hypoproteinemia or actual inflammation.
The common bile duct measures 4 mm in diameter which is within normal limits.
The right kidney measures 11.5 cm in greatest length and the left kidney measures 11.6 cm in greatest length.  No renal stones, masses, or hydronephrosis.
We were unable to visualize the abdominal aorta and the pancreas is not well shown, although a small portion of the pancreatic body is seen and appears normal.
IMPRESSION: 1.  Limited exam as detailed above. There is apparent hepatosplenomegaly.  No gallstones are identified.  There is some borderline gallbladder wall thickening.

## 2006-09-17 IMAGING — CR DG CHEST 1V PORT
1 series · 1 of 1 positions shown · non-contrast
Comparison: Films from 5805 hours.

CLINICAL DATA: The patient has congestive heart failure. 
 PORTABLE CHEST - 1 VIEW- 5595 HOURS:

[view not recorded]
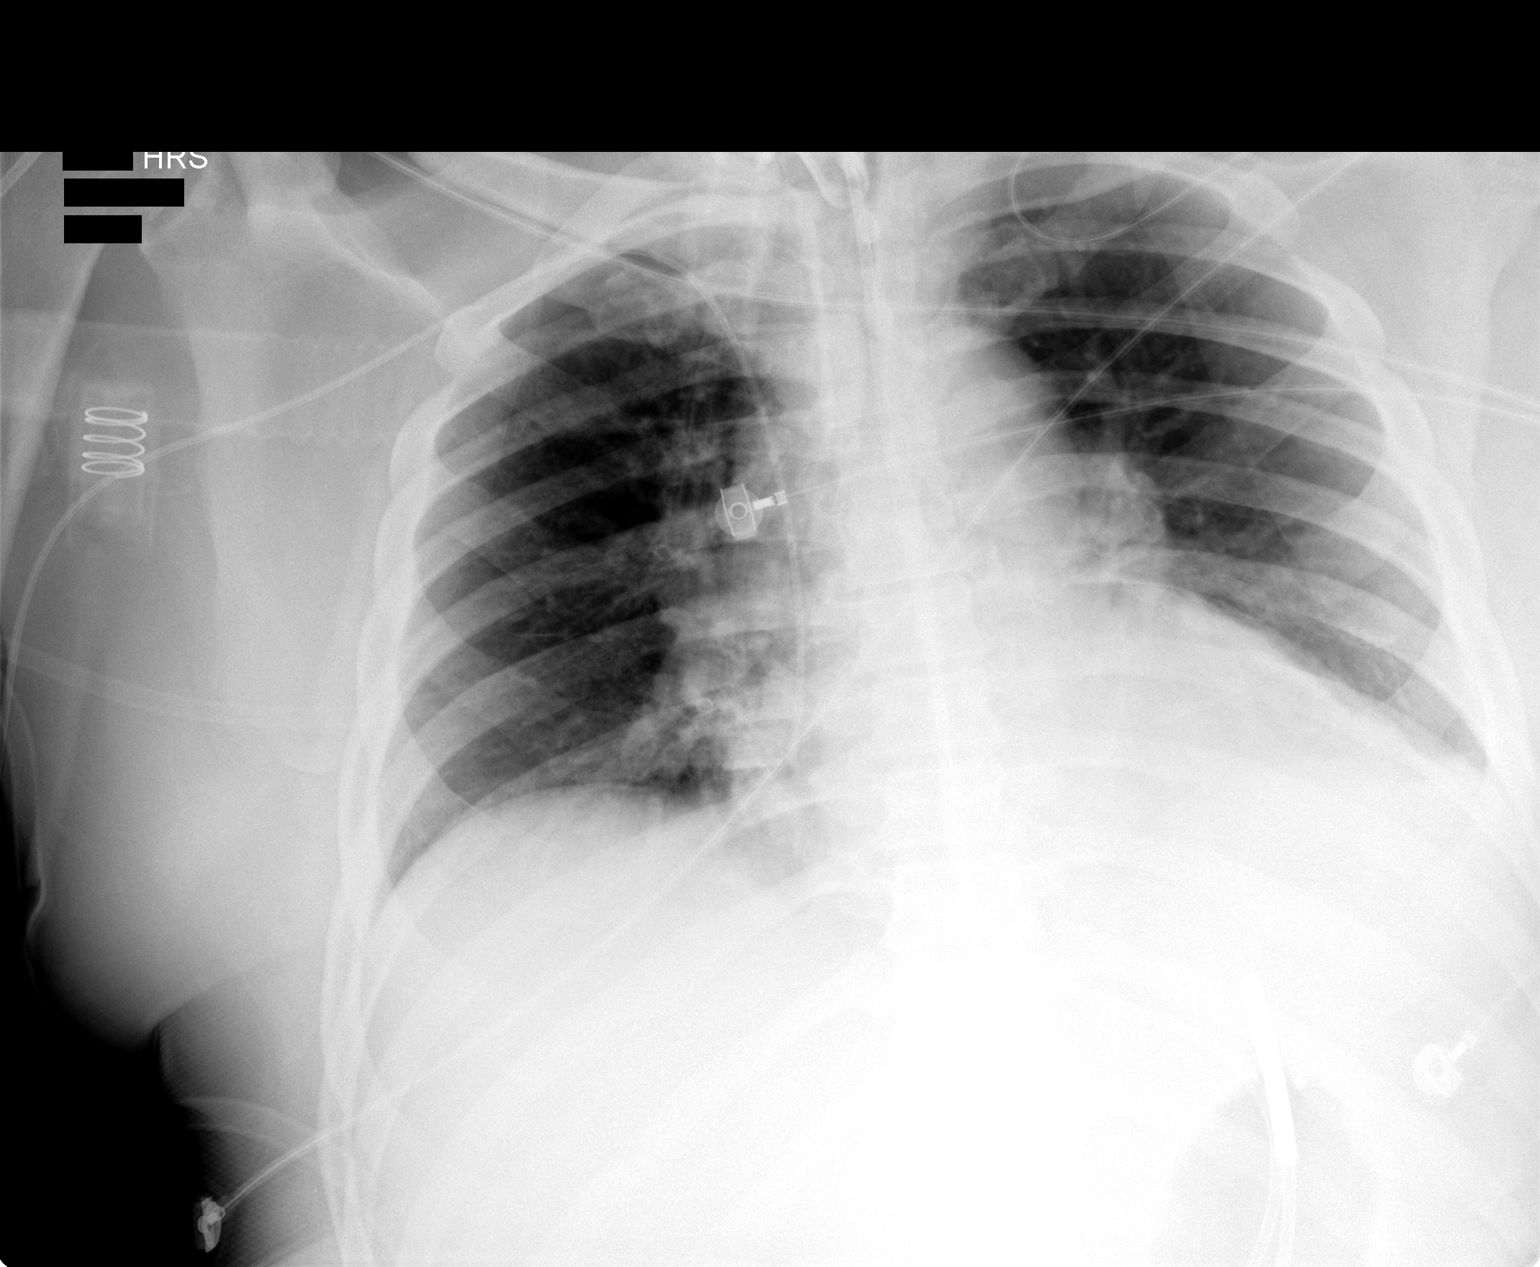

[1 of 1 positions shown; findings below may reference images not displayed]

An AP view of the chest reveals the heart size to remain enlarged.  Atelectasis remains in the bases.  There is no change in the basilar atelectasis.
IMPRESSION: As above.

## 2006-09-20 IMAGING — CR DG CHEST 1V PORT
1 series · 1 of 1 positions shown · non-contrast
Comparison: 12/24/04.

CLINICAL DATA: Acute renal failure. 
 PORTABLE CHEST ? 12/27/04:

[view not recorded]
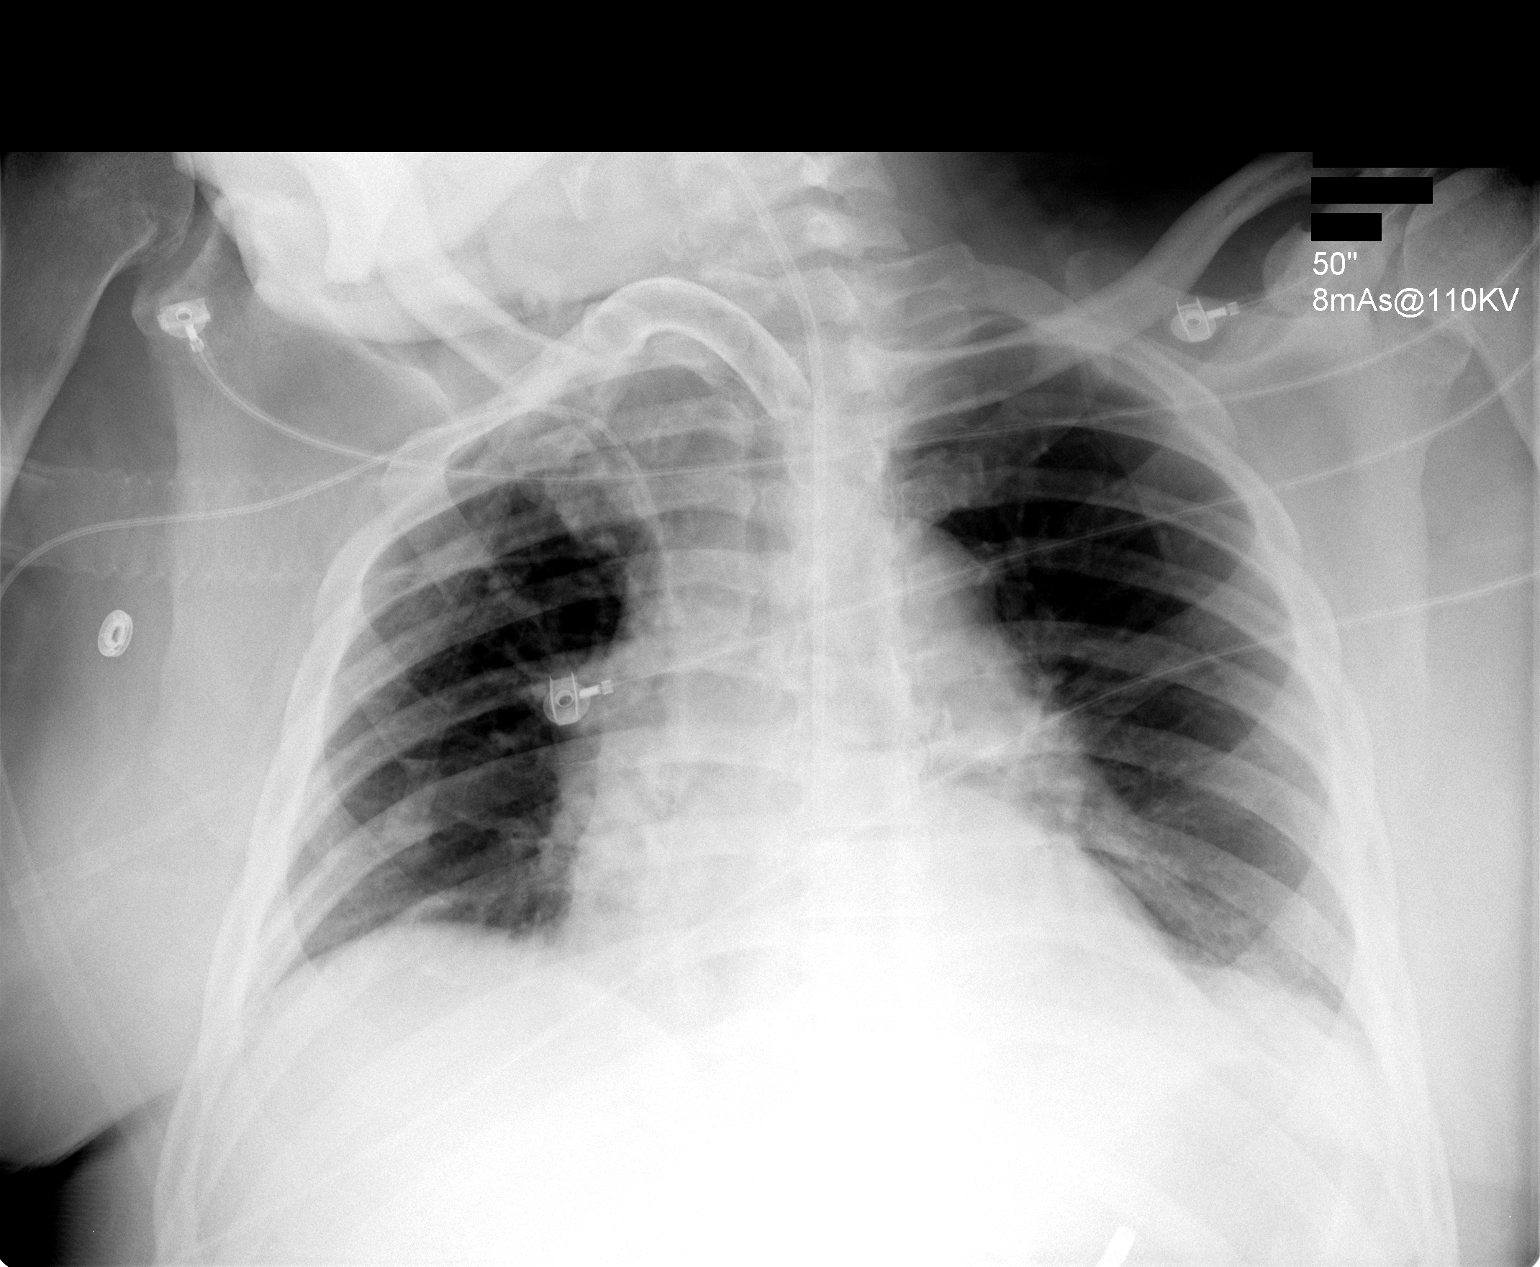

[1 of 1 positions shown; findings below may reference images not displayed]

FINDINGS: Support tubes and lines are unchanged.  Note is made of the patient?s feeding tube tip is likely in the stomach.     Left base atelectasis is unchanged.  No new abnormality.
IMPRESSION: 1.  Feeding tube tip lies likely in the stomach. 
 2.  Left base atelectasis.

## 2006-09-21 IMAGING — CR DG CHEST 1V PORT
1 series · 1 of 1 positions shown · non-contrast
Comparison: none

CLINICAL DATA: Acute renal failure.  Follow-up left base atelectasis.
 PORTABLE SEMIERECT CHEST - 1 VIEW - 12/28/04 AT [DATE] A.M.:
 Comparison examination:  12/27/04 at [DATE] a.m.
 Right central line and tracheostomy tube are once again noted.  The patient is rotated towards the left.  Cardiomegaly with central pulmonary vascular prominence.  Opacification of the left base may represent atelectasis although infiltrate or other abnormality cannot be excluded.  Clinical correlation and follow-up recommended until this has cleared.

[view not recorded]
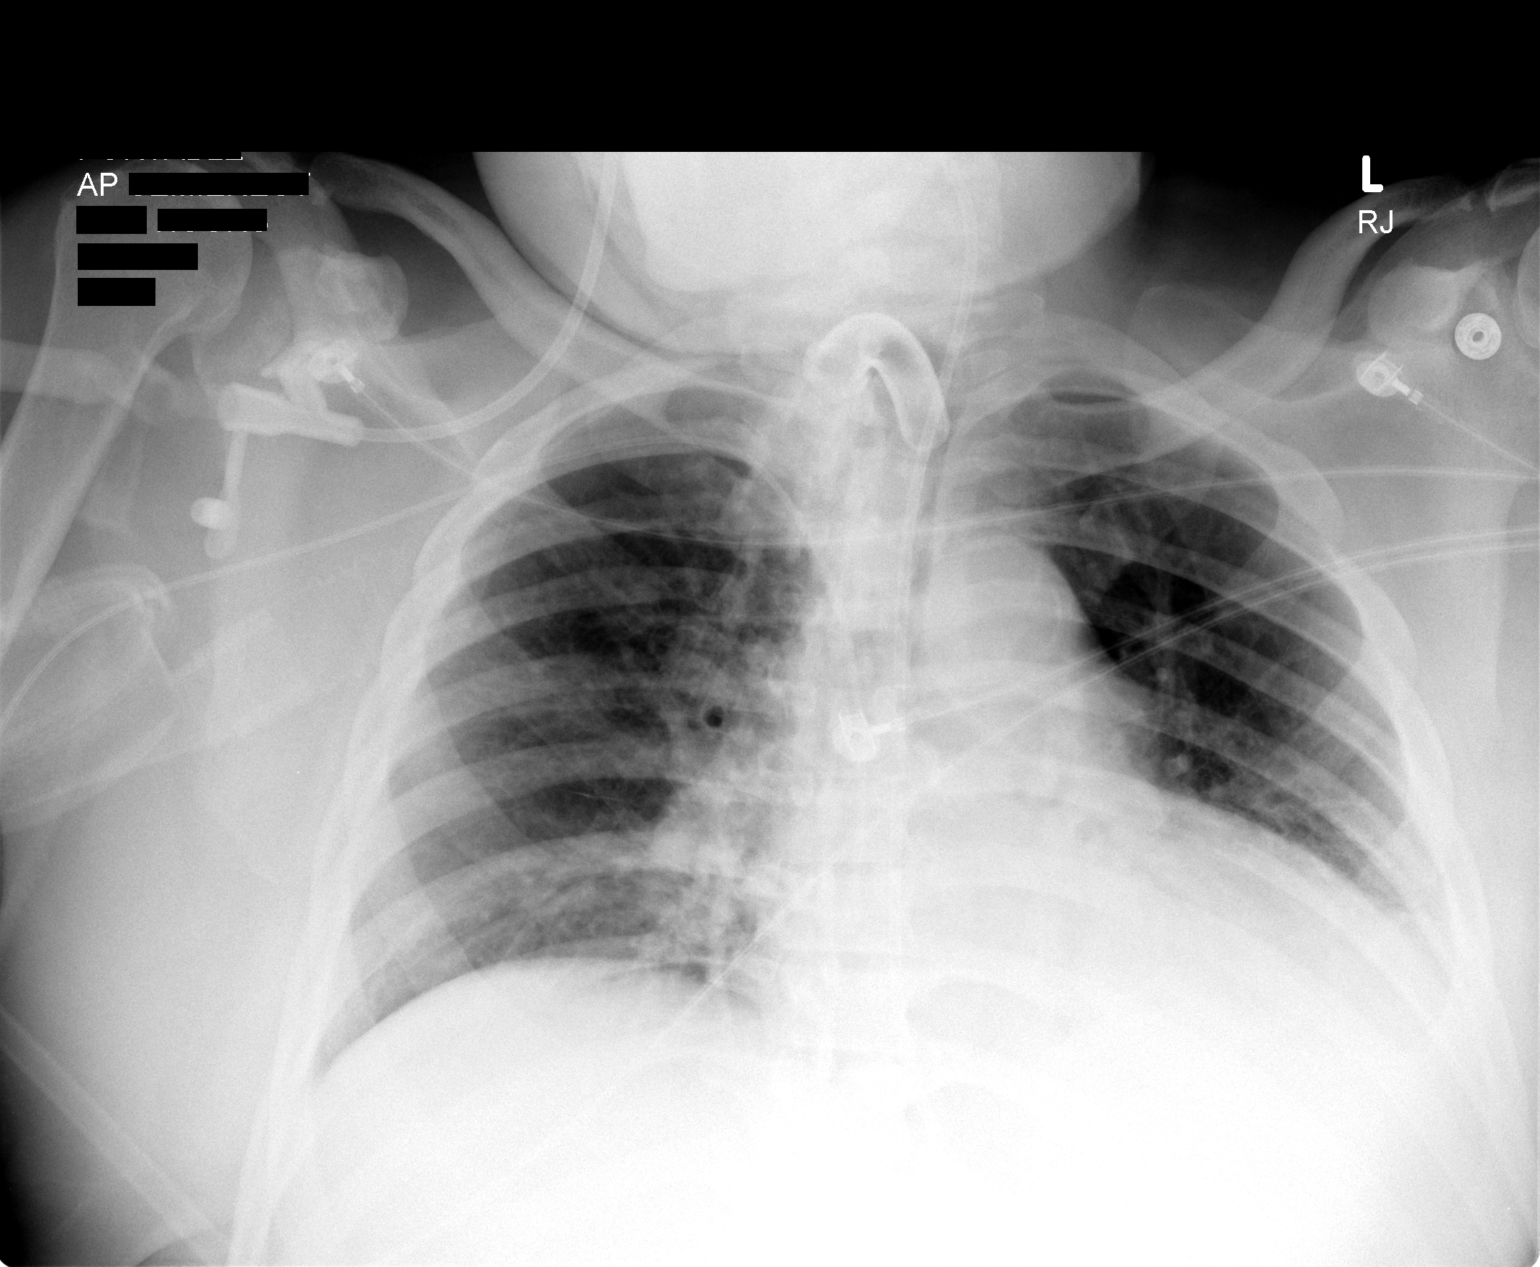

[1 of 1 positions shown; findings below may reference images not displayed]

IMPRESSION: Persistent opacification of the left base.  Question infiltrate and/or atelectasis.

## 2006-09-23 IMAGING — CR DG CHEST 1V PORT
1 series · 1 of 1 positions shown · non-contrast
Comparison: Portable film from 12/28/04.

CLINICAL DATA: Acute renal failure; congestive heart failure; on ventilator
 PORTABLE CHEST, 12/30/04, [DATE] HOURS:

[view not recorded]
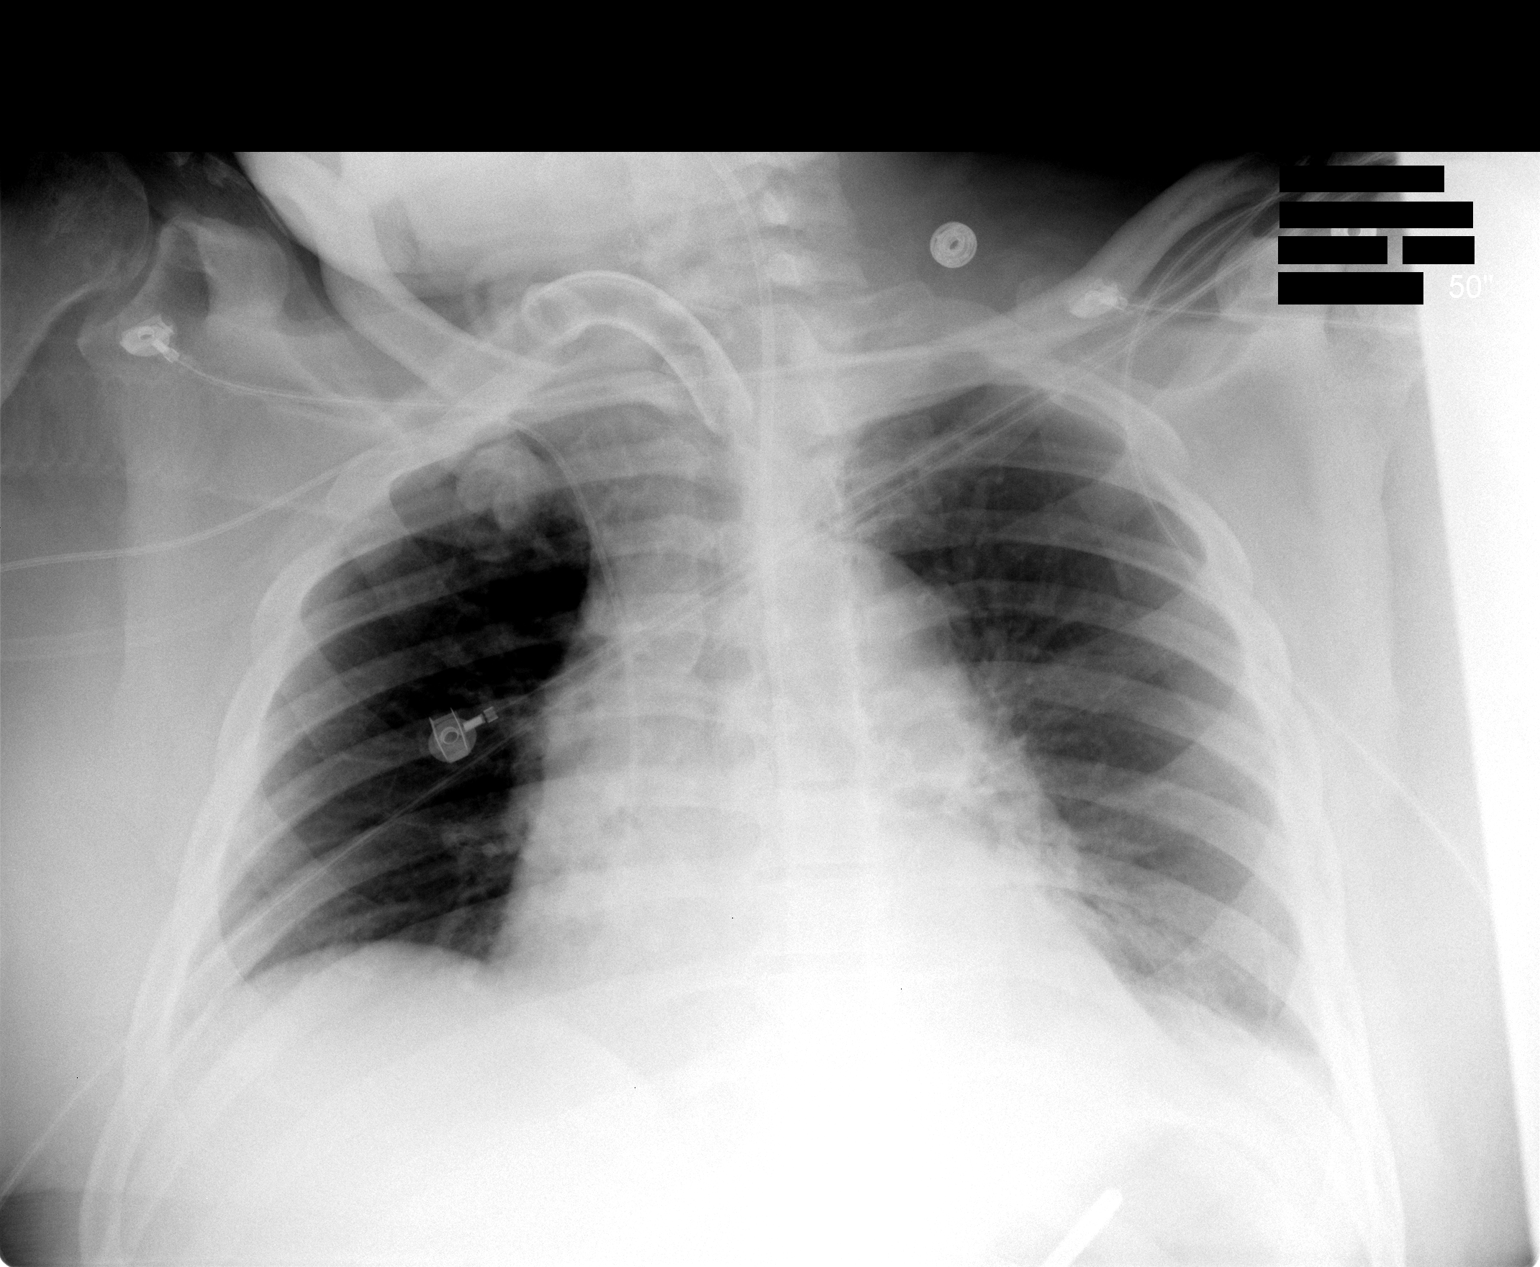

[1 of 1 positions shown; findings below may reference images not displayed]

Aeration has improved and air space disease has improved as well.  Mild left basilar atelectasis remains.  Cardiomegaly is unchanged.  Right PICC line and tracheostomy are unchanged.
IMPRESSION: Improved aeration with mild left basilar atelectasis remaining.

## 2006-09-24 IMAGING — CR DG CHEST 1V PORT
1 series · 1 of 1 positions shown · non-contrast
Comparison: 12/30/04.

CLINICAL DATA: 58-year-old male with acute renal failure.  Left lower lobe atelectasis. 
 PORTABLE SINGLE-VIEW CHEST RADIOGRAPH - 12/31/04:

[view not recorded]
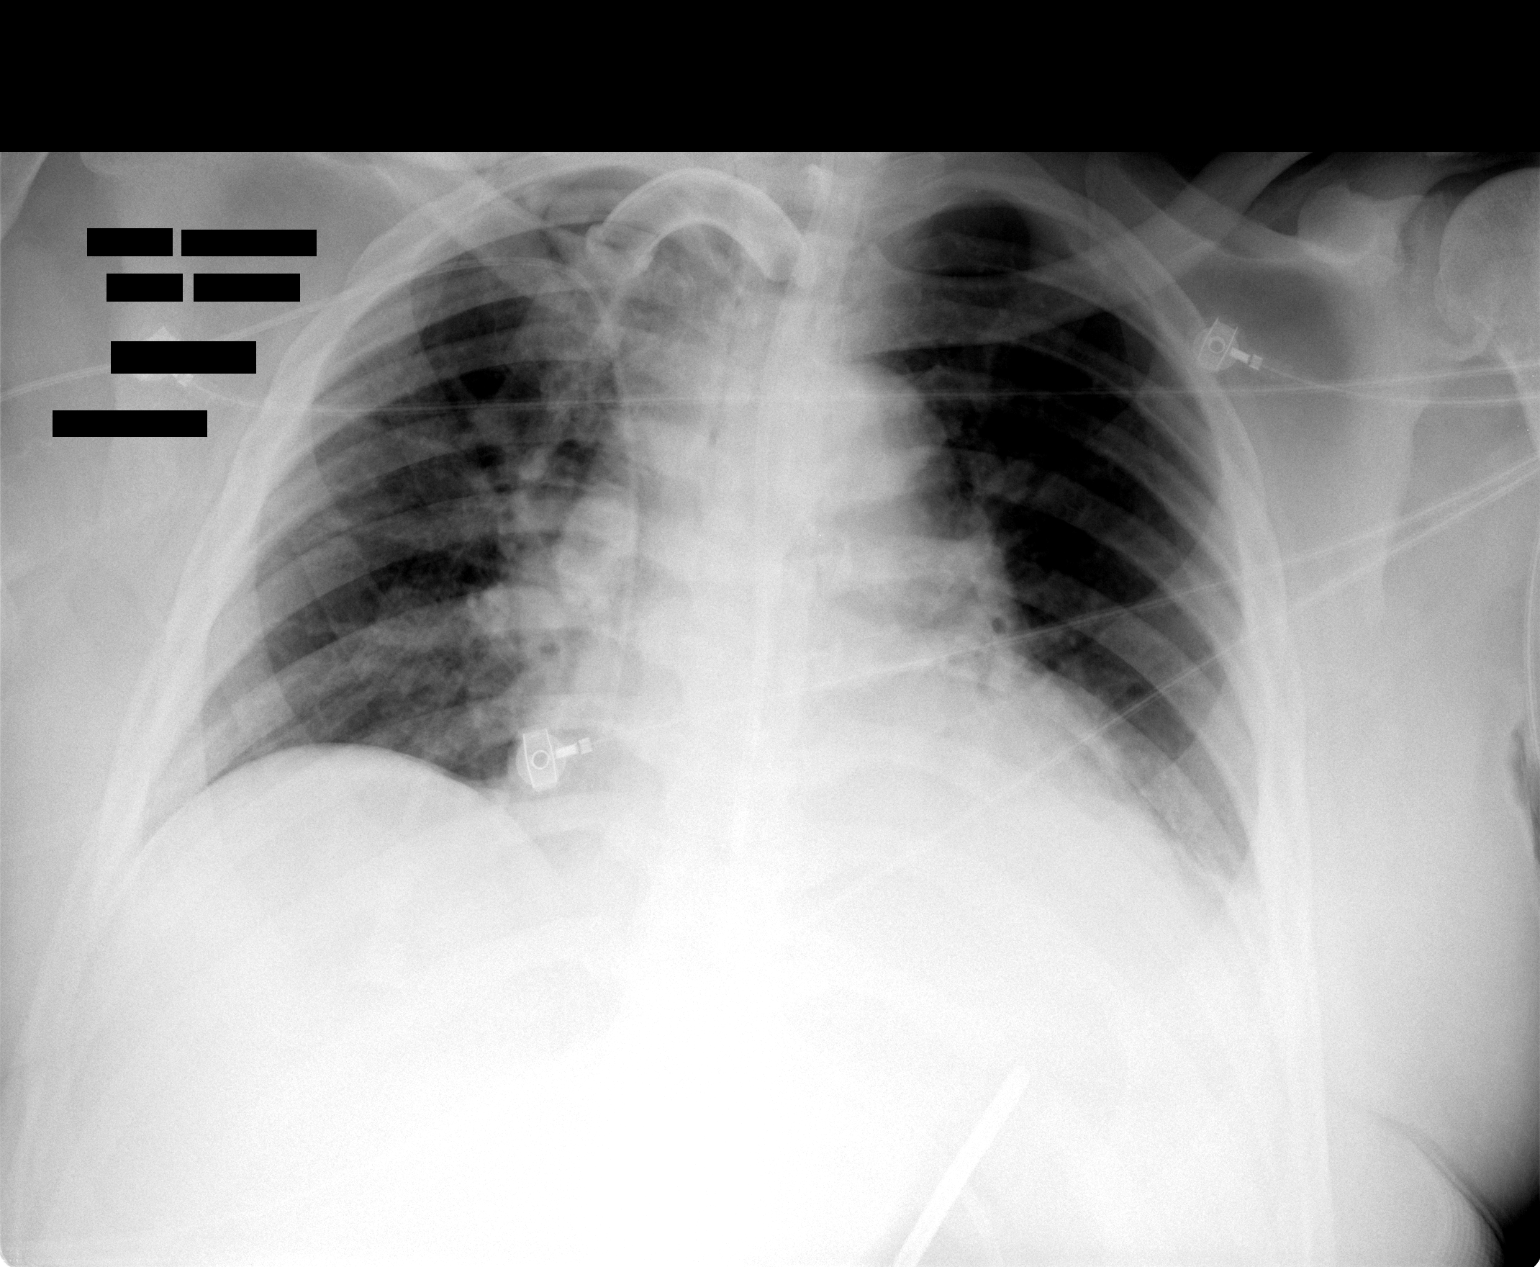

[1 of 1 positions shown; findings below may reference images not displayed]

FINDINGS: Right PICC line, tracheostomy, and feeding tube all remain.  There is slight increase in hilar atelectasis and left lobe atelectasis/consolidation.  No significant effusion.  No pneumothorax.
IMPRESSION: Worsening hilar and left lower lobe atelectasis.

## 2006-09-26 IMAGING — CR DG CHEST 1V PORT
1 series · 1 of 1 positions shown · non-contrast
Comparison: 12/31/04.

CLINICAL DATA: Acute renal failure; congestive heart failure; follow-up aeration
 PORTABLE CHEST - 1 VIEW, 01/02/05, 8909 HOURS:

[view not recorded]
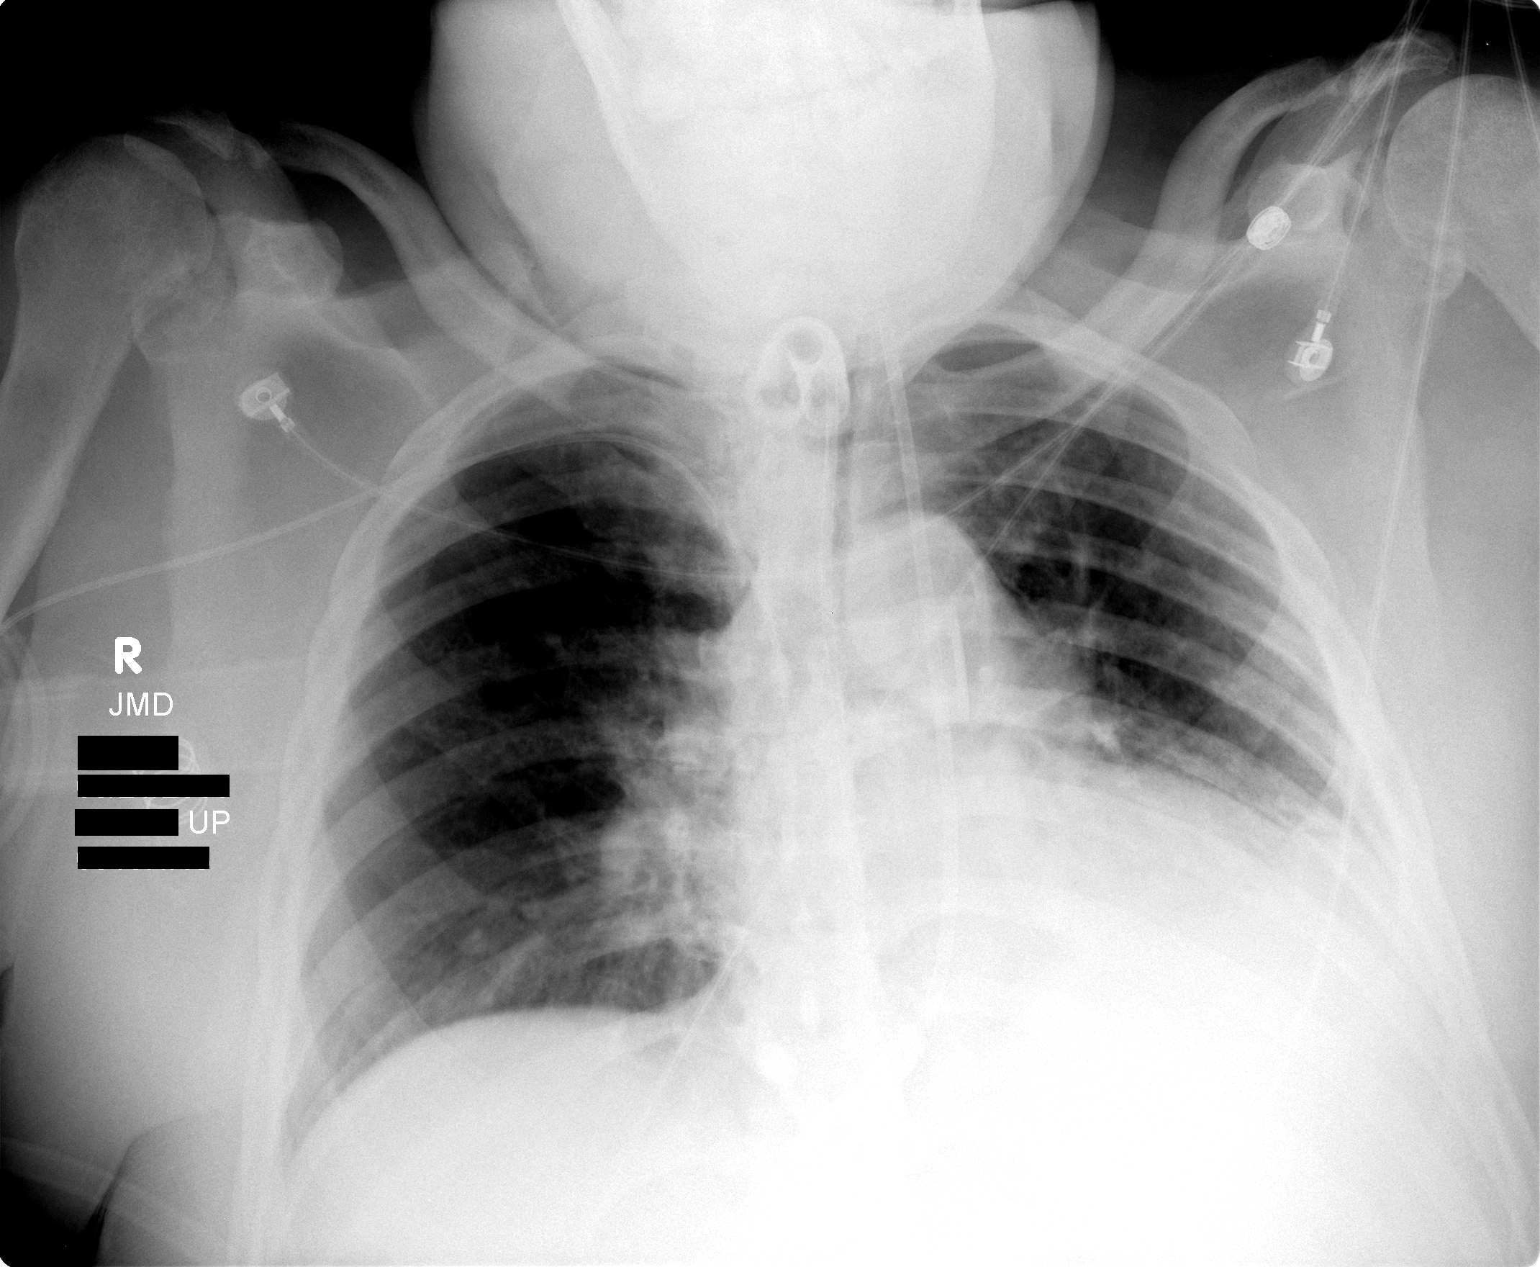

[1 of 1 positions shown; findings below may reference images not displayed]

There is bibasilar atelectatic/infiltrative changes (left greater than right).  There has been no significant change in aeration.  Right sided PICC line tip is in the region of the superior vena cava.  Tracheostomy tube appears in satisfactory position.
IMPRESSION: Bibasilar (left greater than right) atelectasis/infiltrative without significant change in aeration.

## 2006-09-29 IMAGING — CR DG CHEST 1V PORT
1 series · 1 of 1 positions shown · non-contrast
Comparison: 01/03/2005

CLINICAL DATA: ventilator

PORTABLE CHEST - 1 VIEW:

[view not recorded]
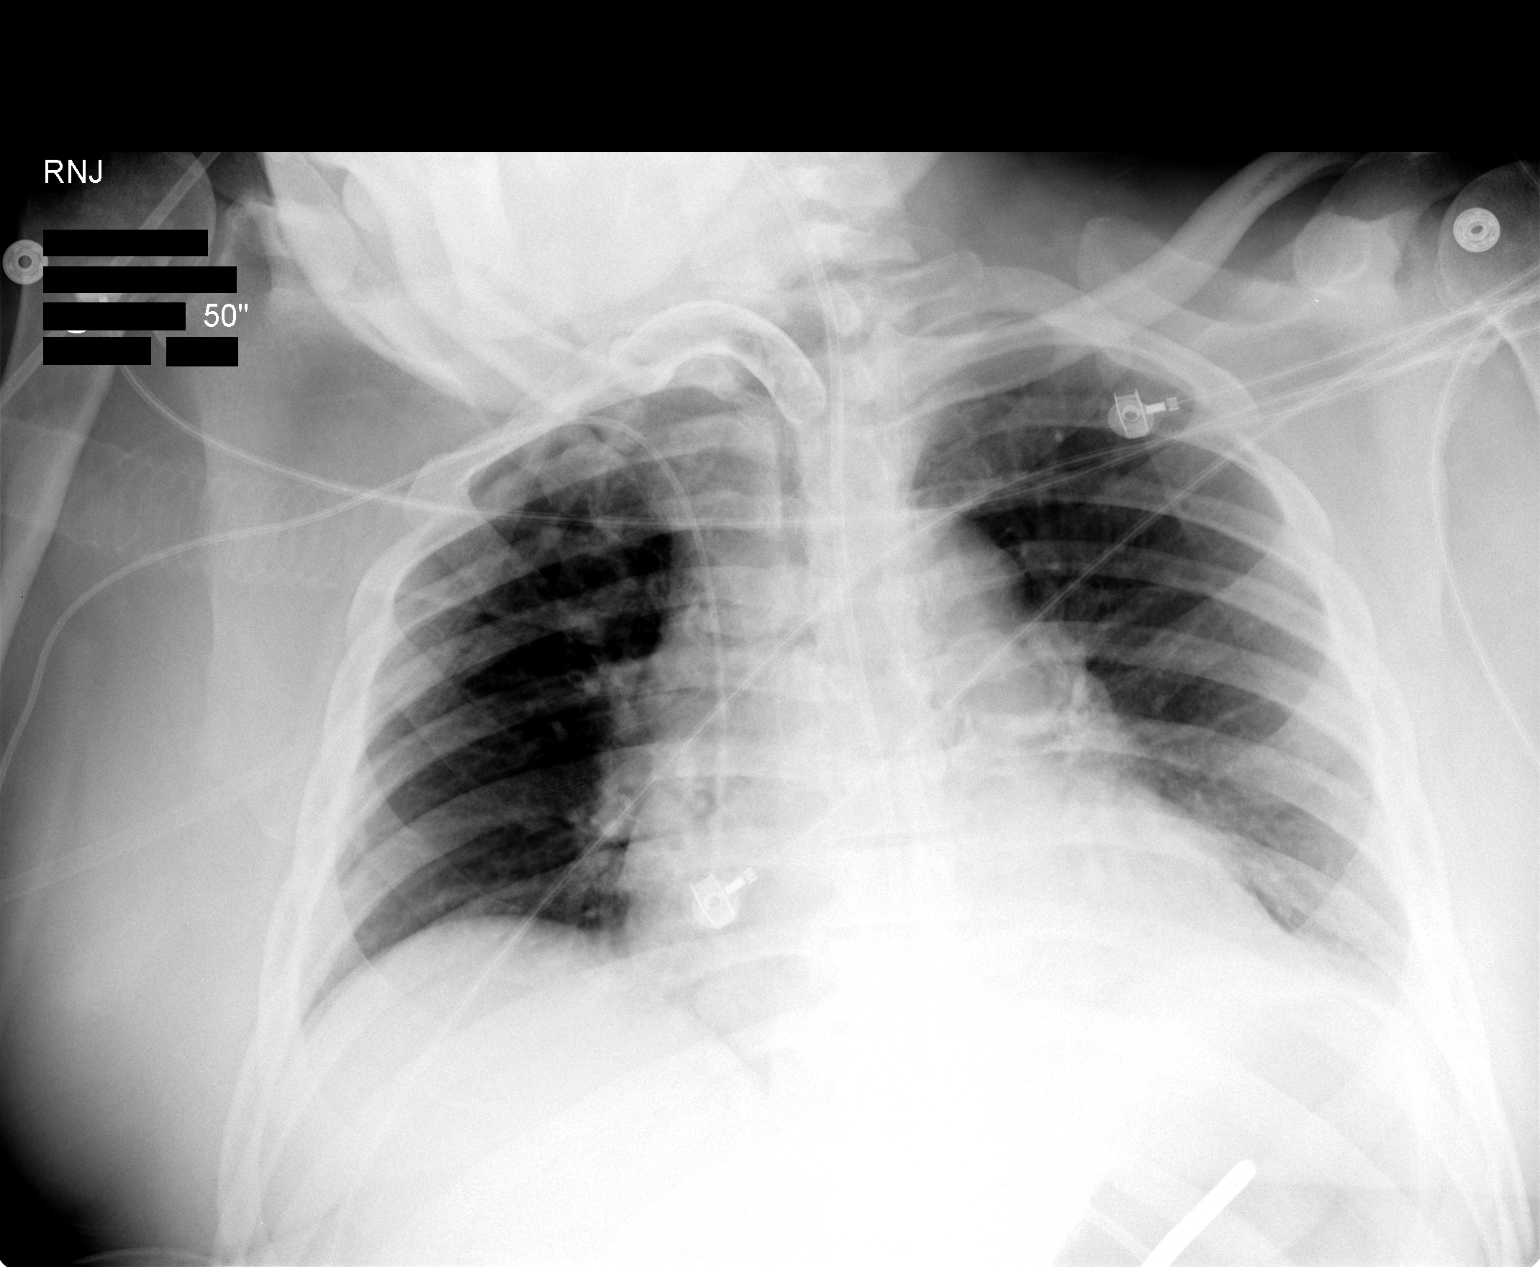

[1 of 1 positions shown; findings below may reference images not displayed]

FINDINGS: Support devices are unchanged. Again noted is cardiomegaly with
vascular congestion, slightly decreased. Stable left base atelectasis or
infiltrate.
IMPRESSION: Slightly decreased vascular congestion. Stable left base opacity

## 2006-09-29 IMAGING — CR DG CHEST 1V PORT
1 series · 1 of 1 positions shown · non-contrast
Comparison: 01/05/2005.

CLINICAL DATA: Placement involving left subclavian central venous catheter.  Please evaluate.
 PORTABLE CHEST, ONE VIEW ? 01/05/2005 ? (2533 HOURS):

[view not recorded]
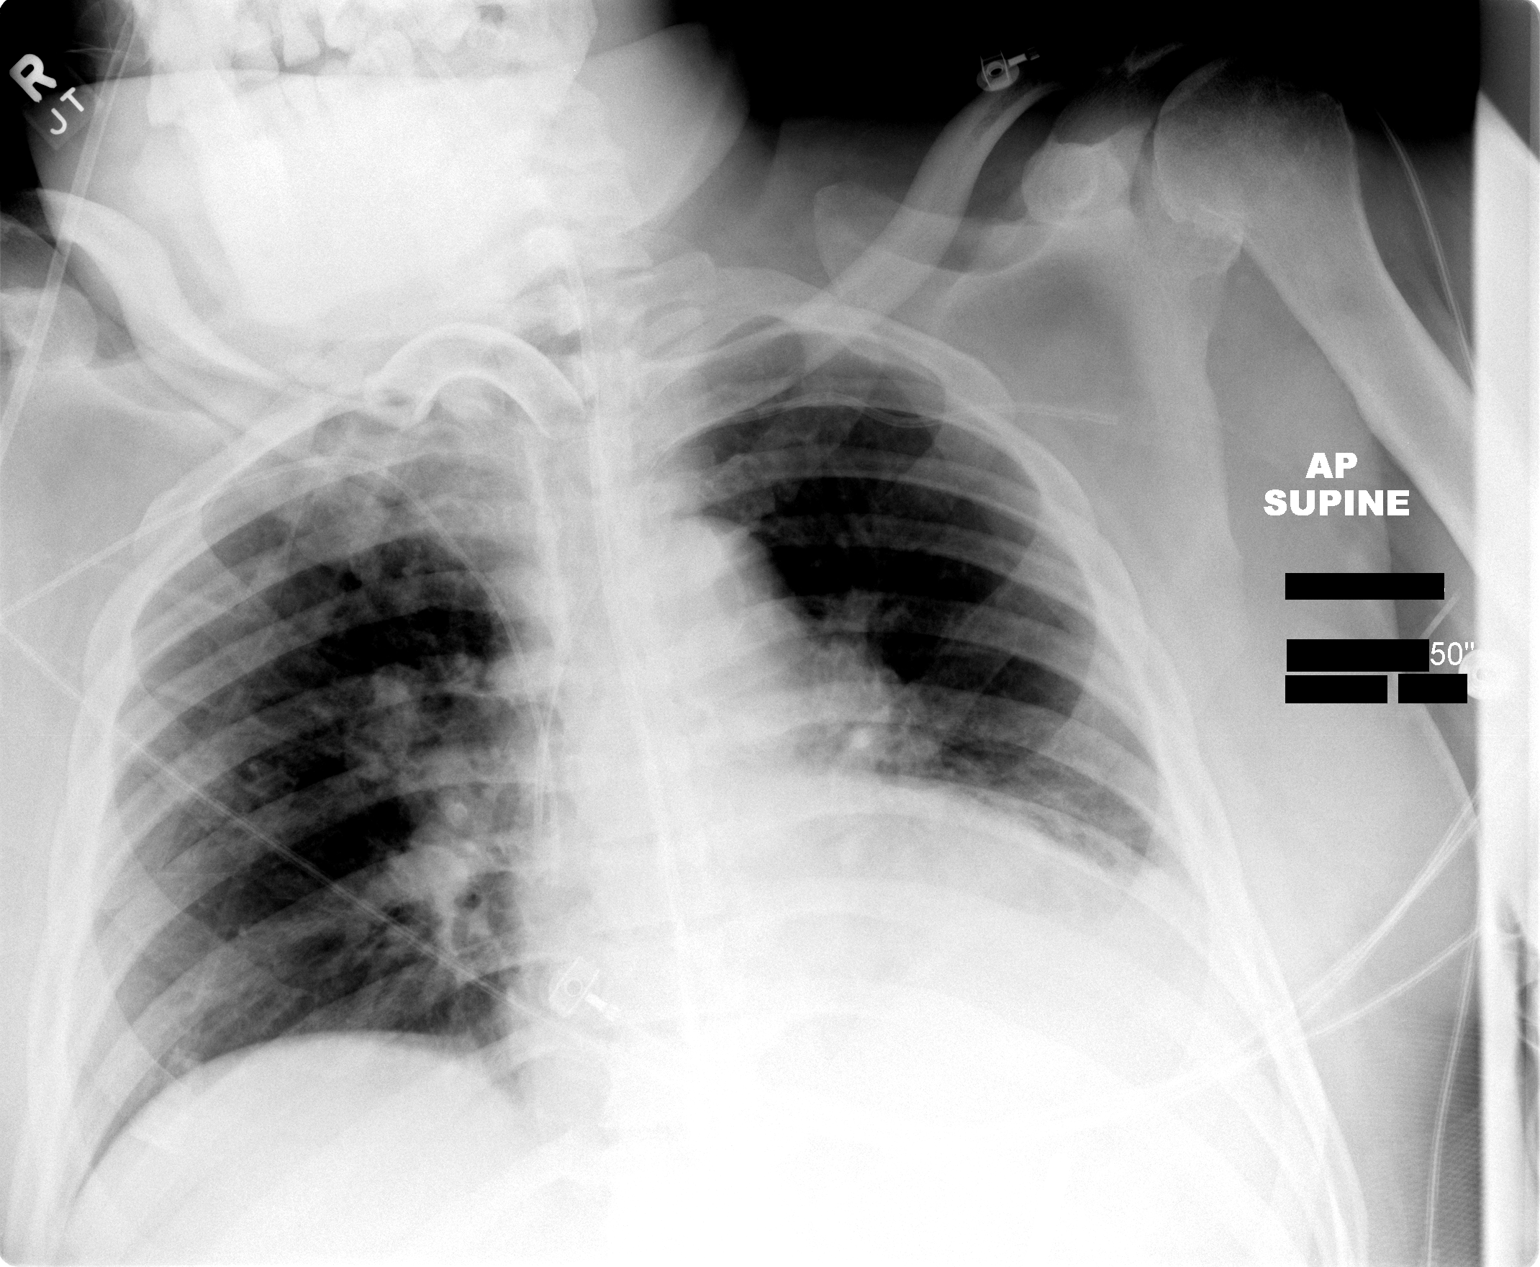

[1 of 1 positions shown; findings below may reference images not displayed]

FINDINGS: There has been interval placement of a left subclavian central venous catheter with the tip of the catheter in the region of the superior vena cava.  There is no pneumothorax.  There is cardiomegaly and there are mild let basilar atelectatic changes.
IMPRESSION: Placement of left subclavian central venous catheter with tip of the catheter within the superior vena cava.  No pneumothorax.  Cardiomegaly with mild left basilar atelectasis.

## 2006-10-01 IMAGING — CR DG CHEST 1V PORT
1 series · 1 of 1 positions shown · non-contrast
Comparison: 01/05/2005

CLINICAL DATA: Respiratory distress

PORTABLE CHEST - 1 VIEW:

[view not recorded]
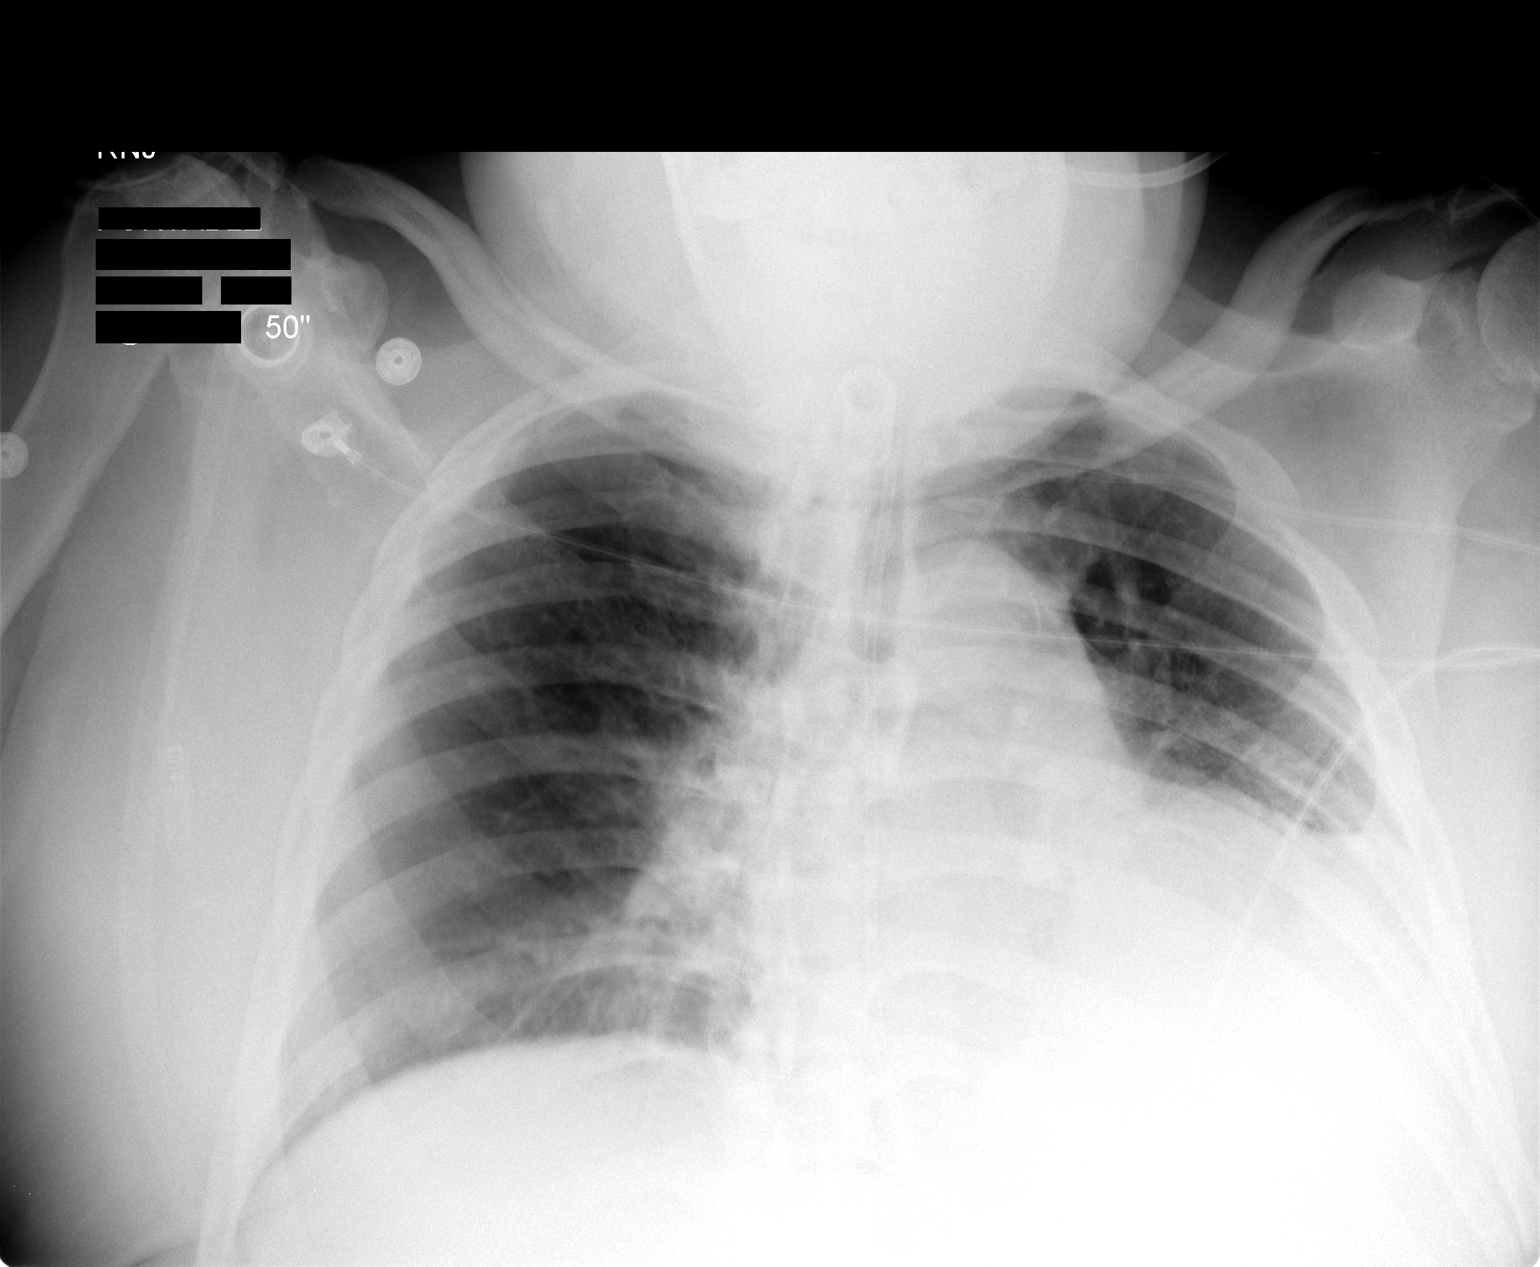

[1 of 1 positions shown; findings below may reference images not displayed]

FINDINGS: Interval removal of right PICC line. Otherwise support devices are
unchanged. The continued cardiomegaly with vascular congestion. Left lower lobe
opacity and possible left effusion noted. No real change since prior study.
IMPRESSION: No significant change.

## 2006-10-04 IMAGING — CR DG CHEST 1V PORT
1 series · 1 of 1 positions shown · non-contrast
Comparison: 01/07/05.

CLINICAL DATA: Renal failure.
 PORTABLE CHEST -1 VIEW, 01/10/05, 6966 HOURS:

[view not recorded]
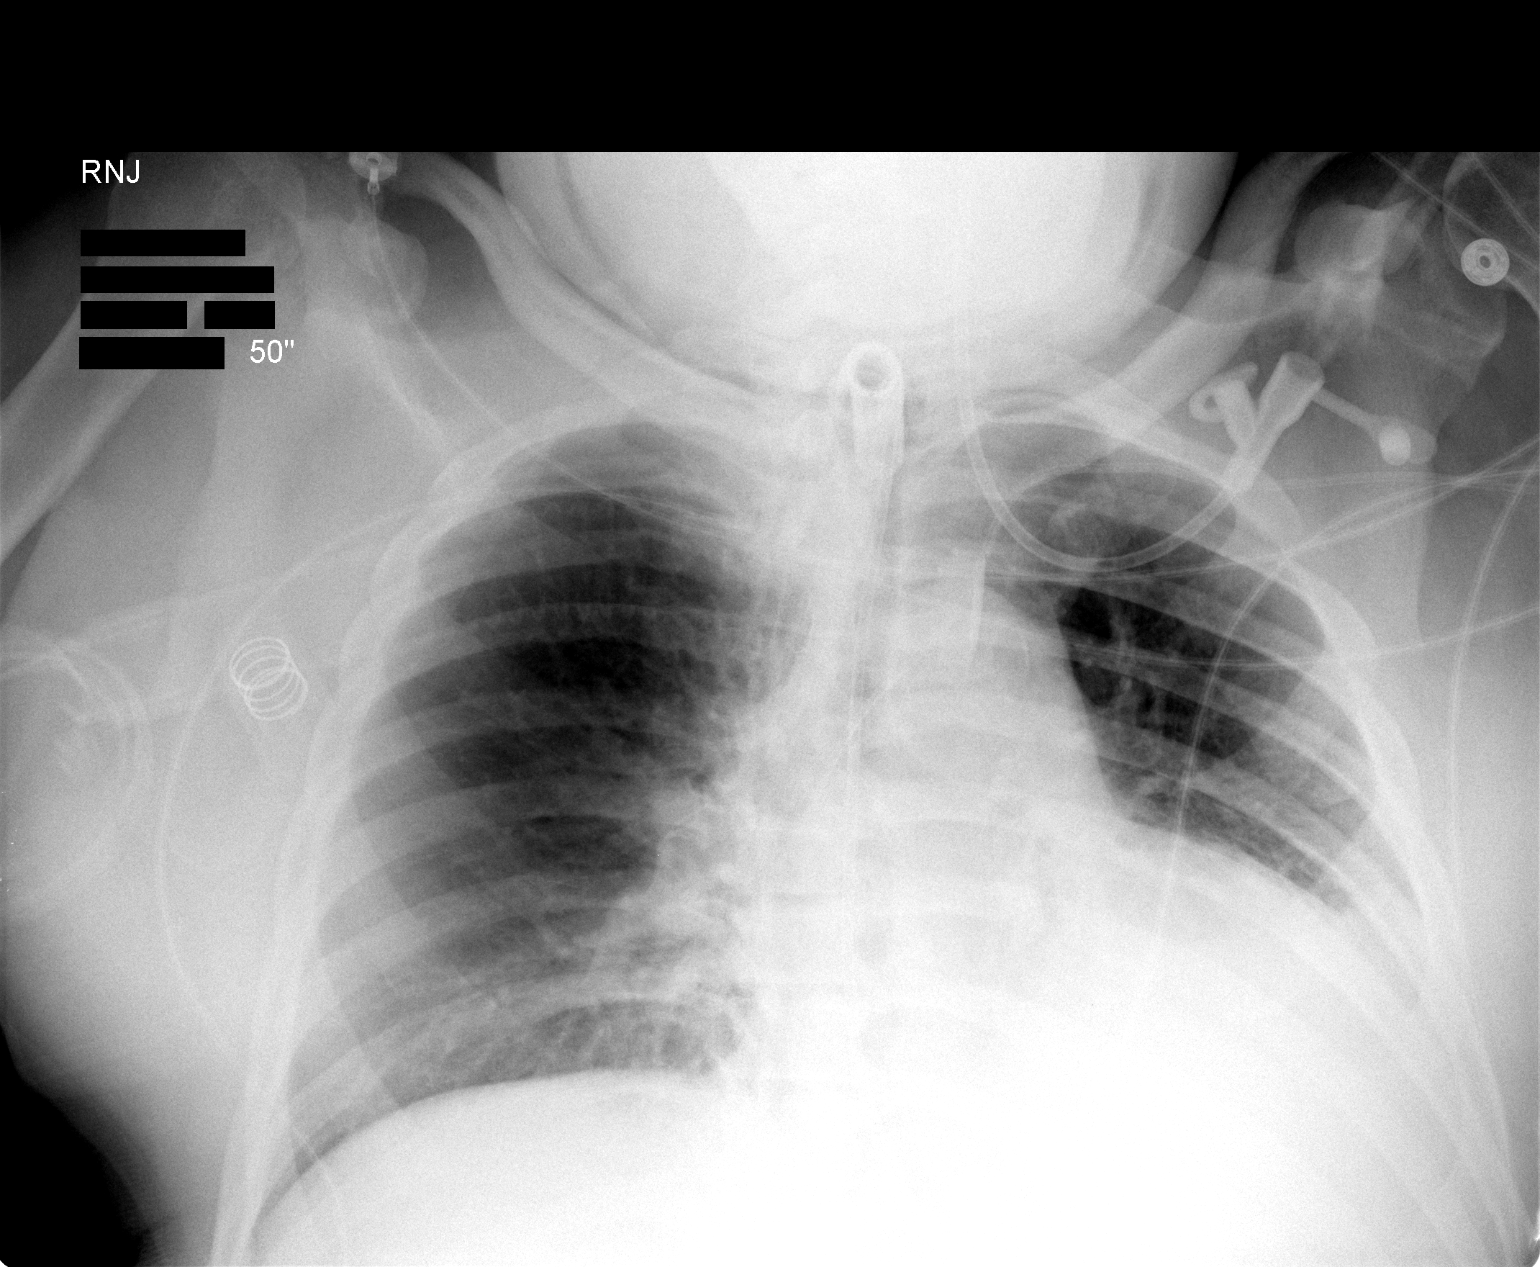

[1 of 1 positions shown; findings below may reference images not displayed]

The tubular structures are stable.  Low lung volumes with dense atelectasis at the left base is not significantly changed.  No pneumothoraces are seen.
IMPRESSION: No significant change.

## 2006-10-05 IMAGING — CT CT ABDOMEN W/O CM
1 of 4 series · 13 of 32 positions shown, 18 images · non-contrast
Comparison: none

CLINICAL DATA: 58 year-old enterocutaneous fistula.
TECHNIQUE: Helical CT examination of the abdomen and pelvis was performed after oral contrast was instilled through an NG tube. However, apparently the contrast is coming right out of the fistula and there is not good opacification of the GI system.  
 Examination of the lung bases demonstrates dependent atelectasis, left greater than right. The heart is mildly enlarged.  There is a small pericardial effusion. 
 The unenhanced appearance of the liver and spleen are unremarkable. The pancreas is grossly normal.  There is probable sludge and mild inflammation of the gallbladder.  Panda tube coiled in the stomach. There is some contrast in the stomach which was put in right before the scan was started.  I see some contrast in the second portion of the duodenum.  There is some contrast seen in the small bowel loops which are in a midline hernia and appear to communicate with this large open wound on the anterior abdominal wall. I think the fistulous communication must be with one of these probable jejunal loops of a small bowel.  The patient has a right lower quadrant ileostomy. I don?t see any contrast in that. The patient also has a colostomy in the left lower quadrant.  There is some contrast in the colon which is probably from a previous examination.
 The kidneys are grossly normal.   Adrenal glands are unremarkable. No abdominal masses. Aorta is normal in caliber.

[Series 2: abd_pel 5.0 b40s st · axial · 0.86mm/px · z∈[-440,+4]mm · 13 of 101 slices shown, 18 images]
[im 6/101  soft-tissue]
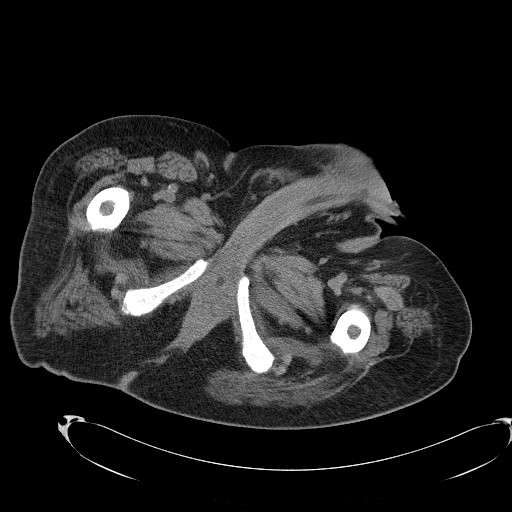
[im 6/101  bone]
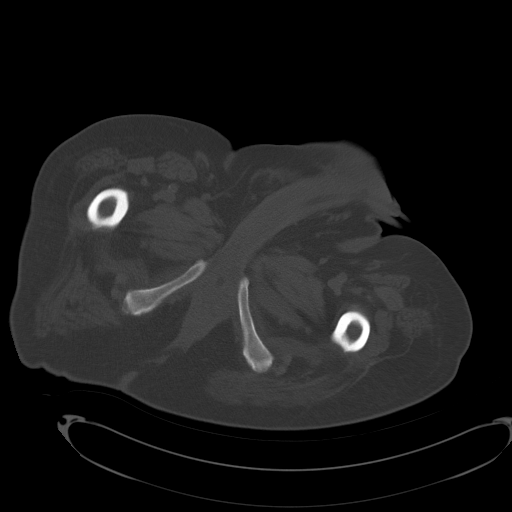
[im 16/101  soft-tissue]
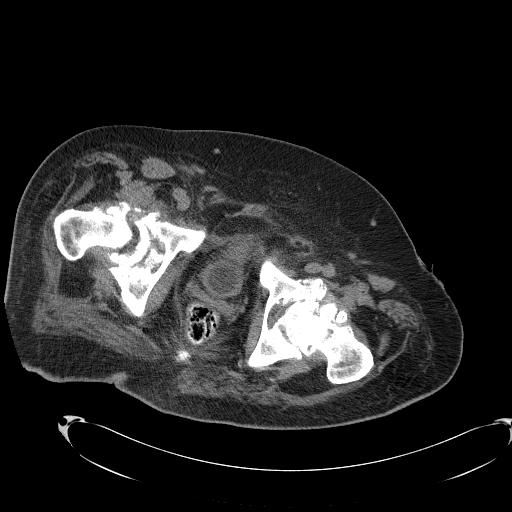
[im 22/101  soft-tissue]
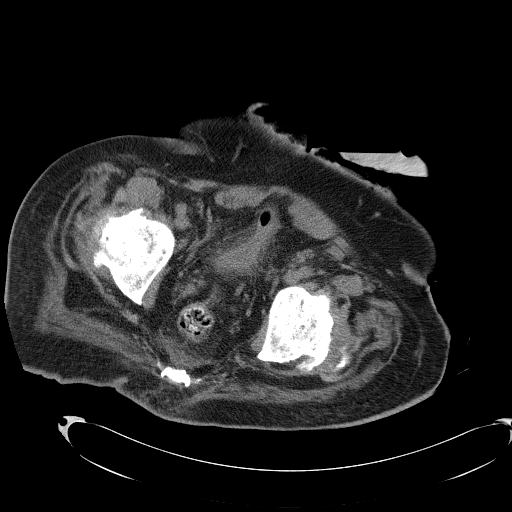
[im 32/101  soft-tissue]
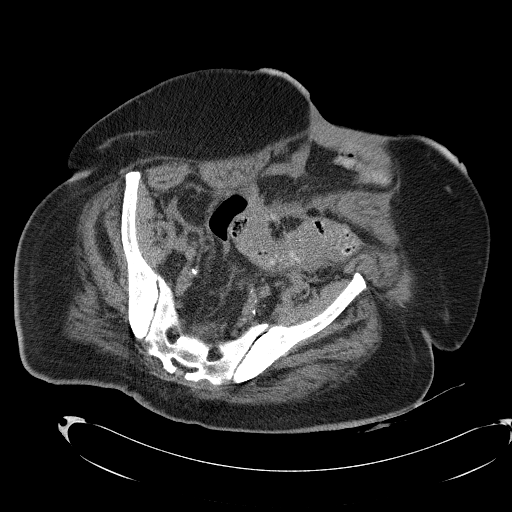
[im 37/101  soft-tissue]
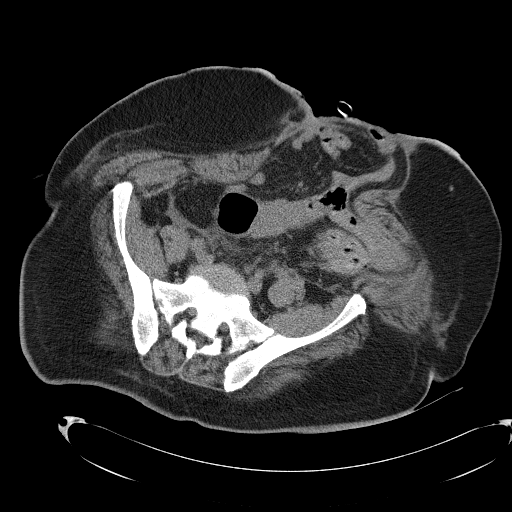
[im 48/101  soft-tissue]
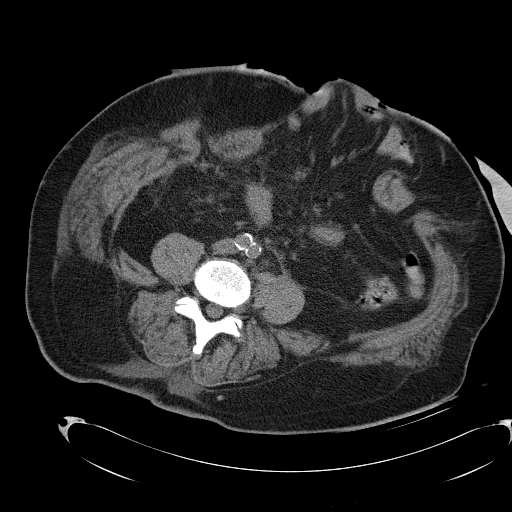
[im 53/101  soft-tissue]
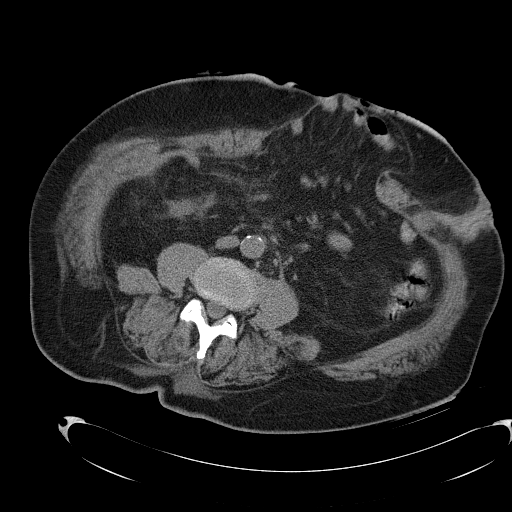
[im 64/101  soft-tissue]
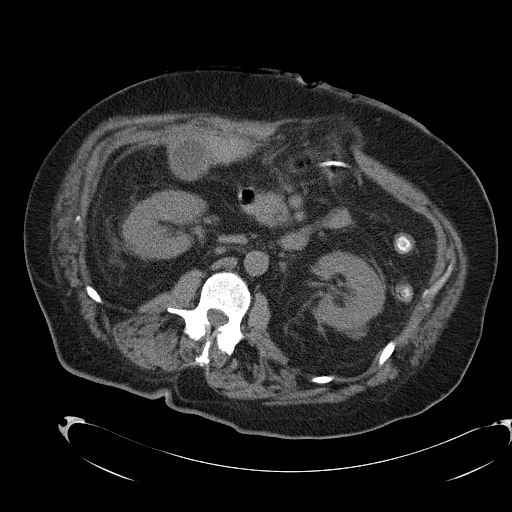
[im 69/101  soft-tissue]
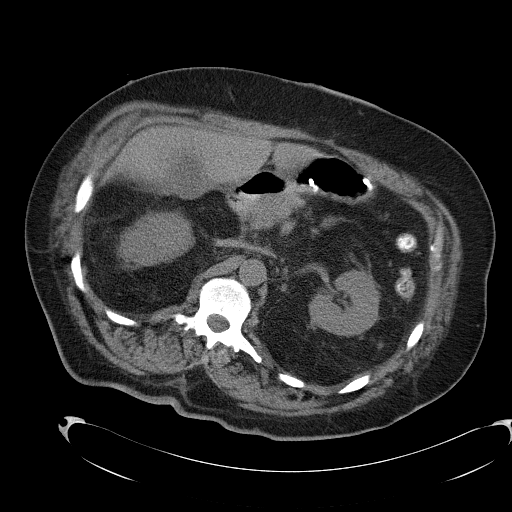
[im 69/101  bone]
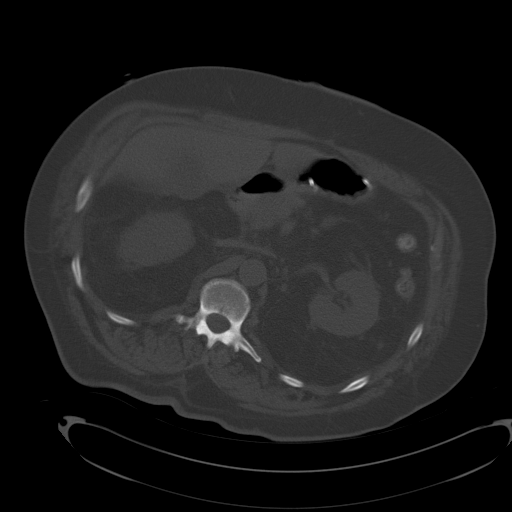
[im 79/101  soft-tissue]
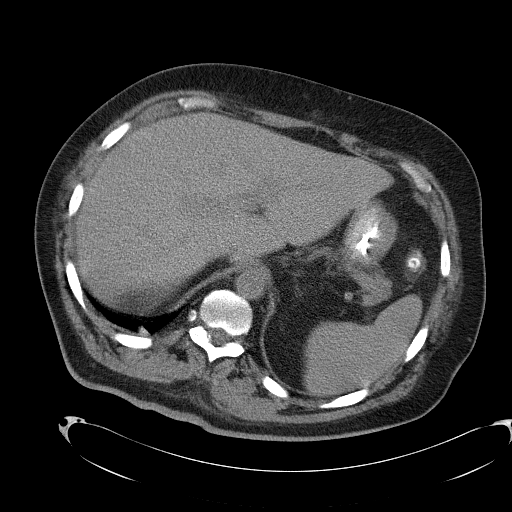
[im 79/101  lung]
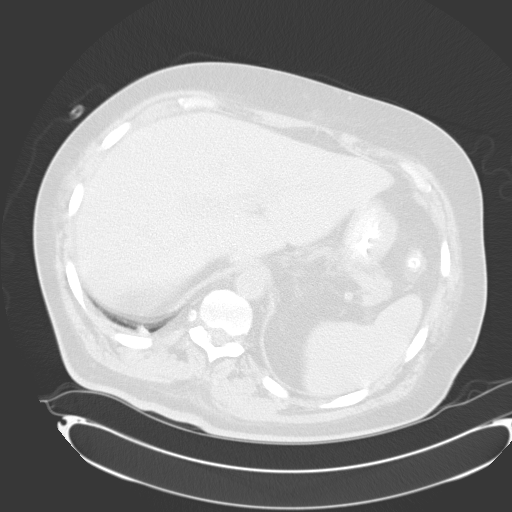
[im 85/101  soft-tissue]
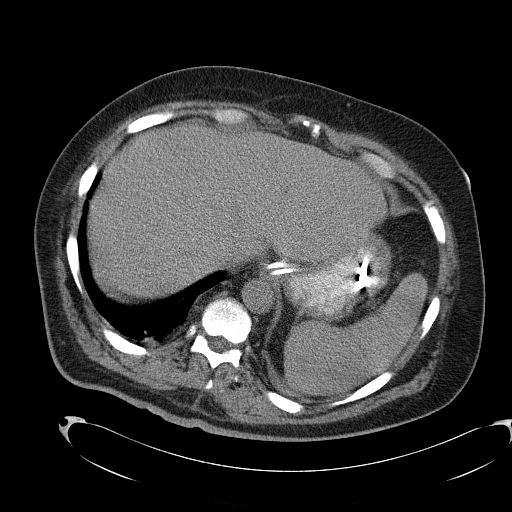
[im 85/101  lung]
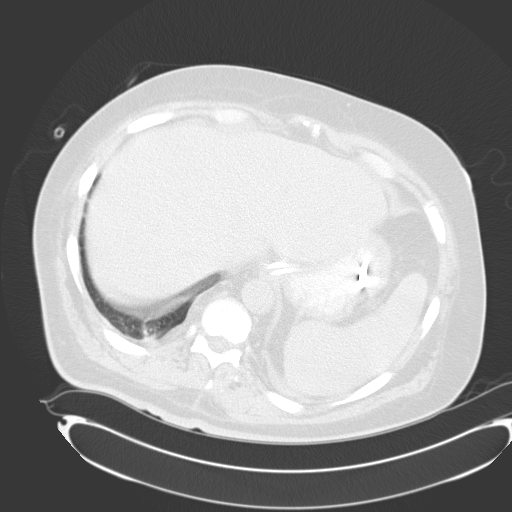
[im 90/101  lung]
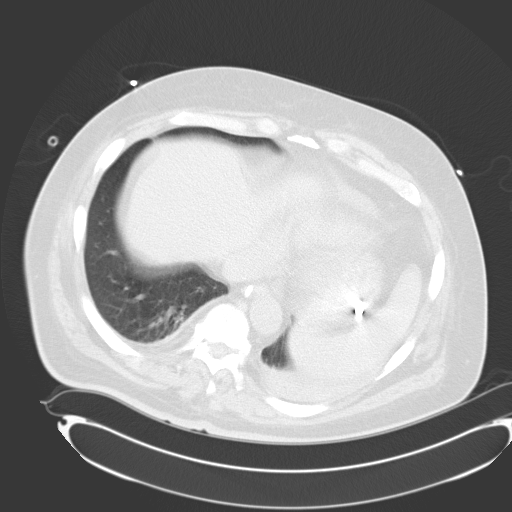
[im 95/101  soft-tissue]
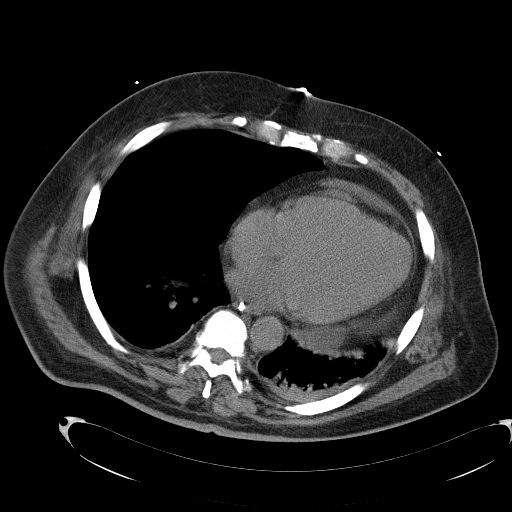
[im 95/101  lung]
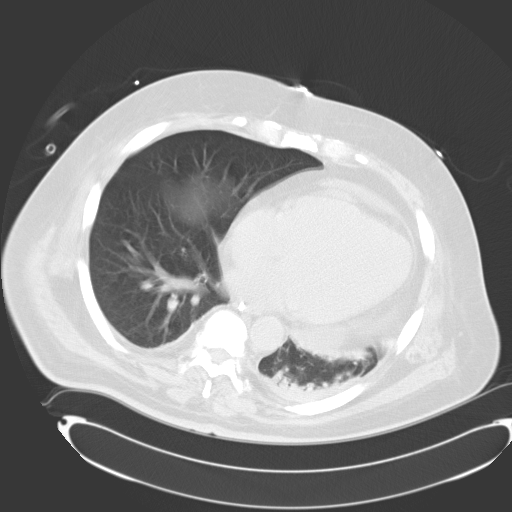

[13 of 32 positions shown; findings below may reference images not displayed]

IMPRESSION: 1.  Limited exam due to poor opacification of the GI tract. There is probably fistulous communication between mid jejunal loops of small bowel and the large open anterior abdominal wound. I don?t see any contrast in the ileal loops of small bowel leading to the ileostomy right lower quadrant.
 2.  Left lower quadrant colostomy. There is contrast in the colon but appears dense and is probably from a previous study.
 3.  Left basilar atelectasis.
 4.  Small pericardial effusion.
 5.  Probable sludge and mild inflammation of the gallbladder.
 CT PELVIS WITHOUT CONTRAST:
 There is thickened loop of sigmoid colon which may reflect diverticulitis. No pelvic masses or adenopathy.  The Foley catheter in the bladder.
IMPRESSION: 1.  Thickened portion of the mid sigmoid colon may suggest diverticulitis.
 2.  No pelvic masses or adenopathy.

## 2006-10-06 IMAGING — CR DG CHEST 1V PORT
1 series · 1 of 1 positions shown · non-contrast
Comparison: 01/10/05 at [DATE] hours.

CLINICAL DATA: Renal failure. 
 PORTABLE CHEST ([DATE] HOURS):

[view not recorded]
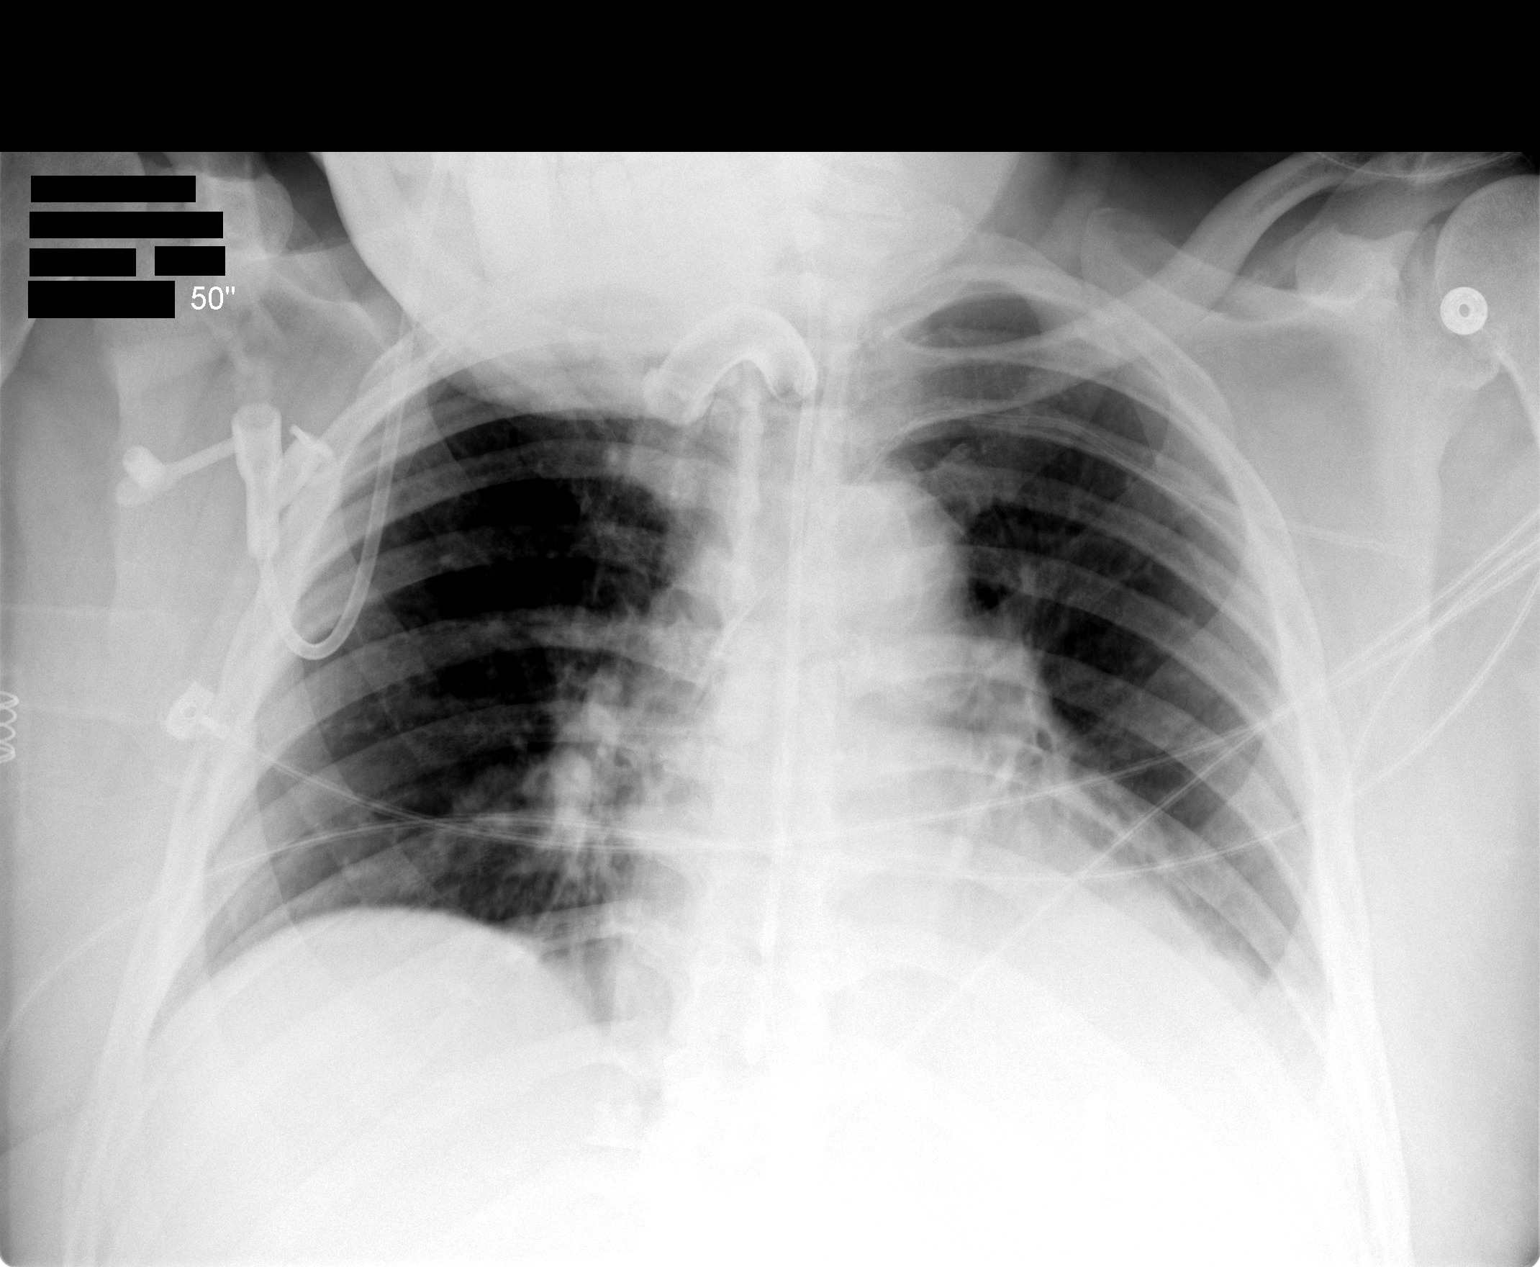

[1 of 1 positions shown; findings below may reference images not displayed]

FINDINGS: The tracheostomy tube, feeding tube, and left subclavian central venous catheter are stable.  Low lung volumes with atelectasis at the left base is again seen.  It has improved.  The right lung is clear.  No pneumothoraces are seen.
IMPRESSION: Improved left basilar atelectasis.  Otherwise stable.

## 2006-10-12 IMAGING — CR DG CHEST 1V PORT
1 series · 1 of 1 positions shown · non-contrast
Comparison: 01/12/05.

CLINICAL DATA: Acute renal failure.  PICC line placement.

[view not recorded]
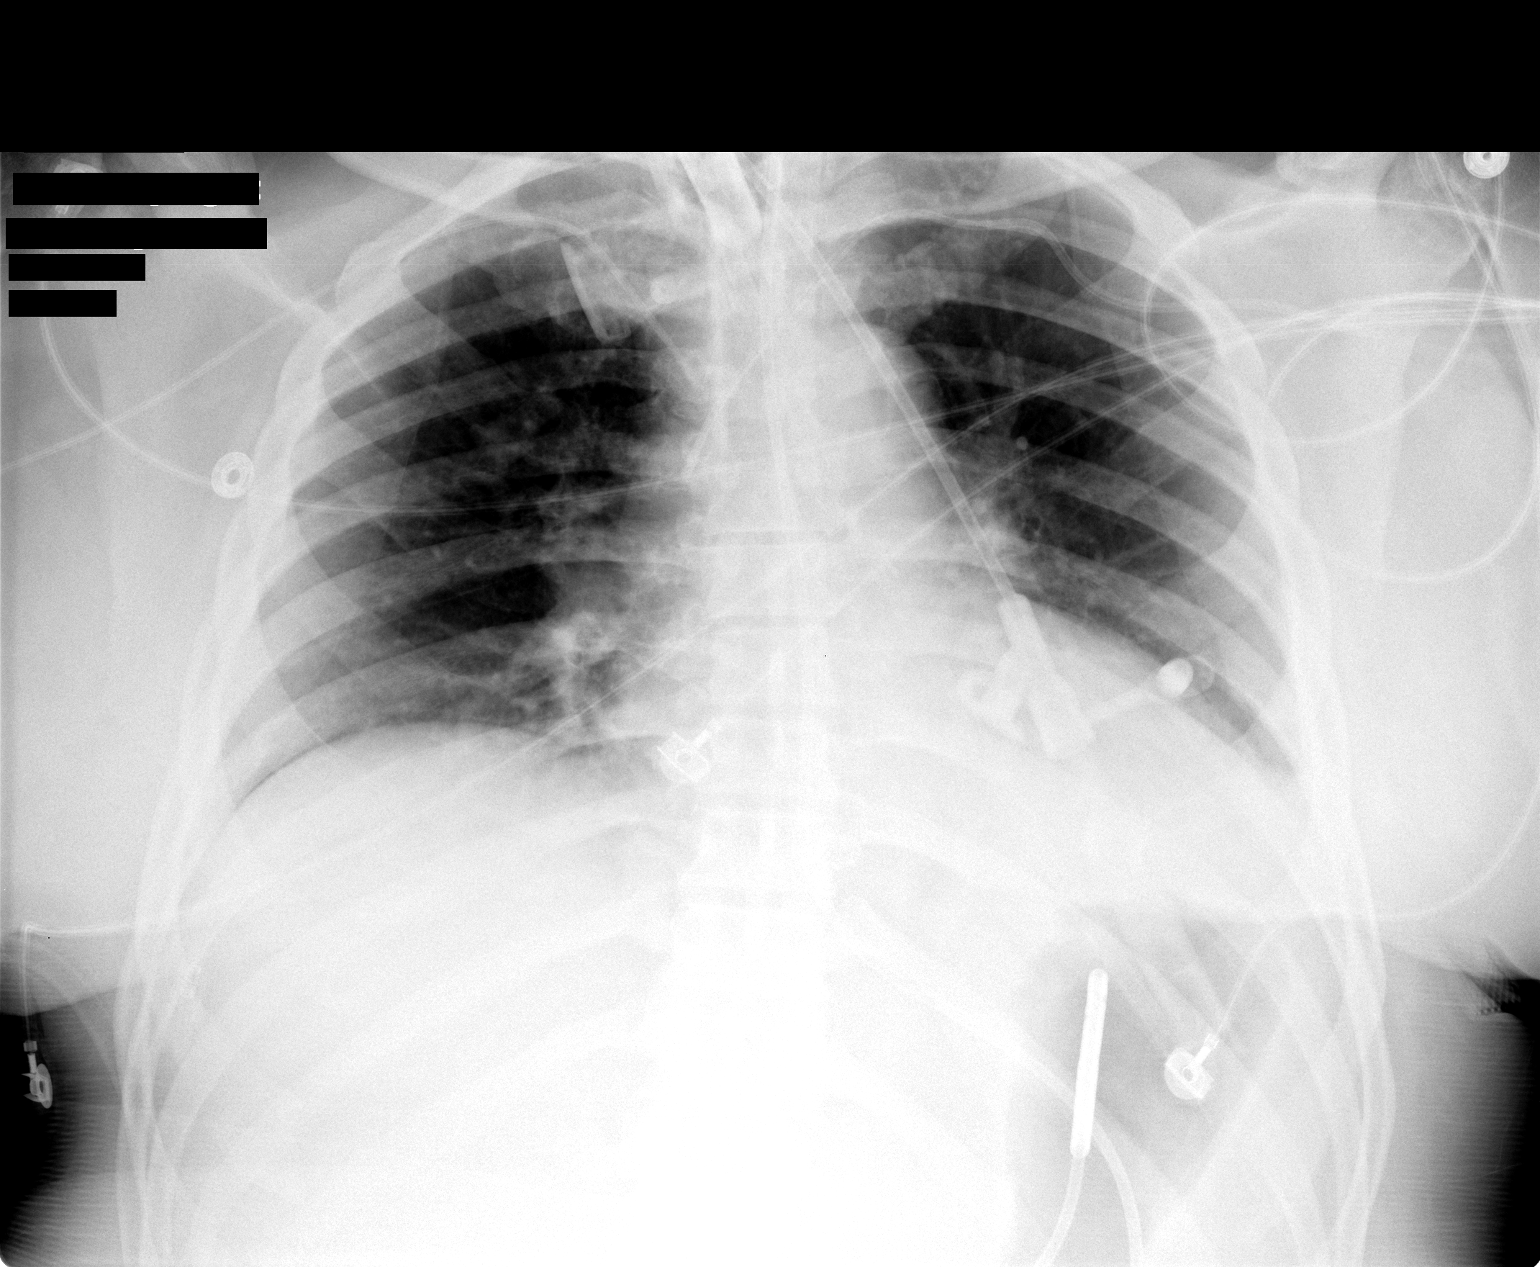

[1 of 1 positions shown; findings below may reference images not displayed]

PORTABLE CHEST - 1 VIEW:
 AP film at 1541 hours shows a right PICC line tip at the level of the proximal SVC.  There is a left subclavian central line in place.  The feeding catheter is coiled in the stomach.  Tracheostomy tube noted. 
 Lung volumes are low with cardiomegaly and left base atelectasis.
IMPRESSION: Right PICC line tip at the level of the proximal SVC.
 Feeding catheter is coiled in the stomach. 
 [REDACTED]

## 2006-10-18 IMAGING — CR DG CHEST 1V PORT
1 series · 1 of 1 positions shown · non-contrast
Comparison: none

CLINICAL DATA: 58-year-old with acute renal failure.  CHF.
 PORTABLE CHEST - 1 VIEW:
 Single portable view of the chest is compared to previous study from 01/18/05.  The tracheostomy tube and Panda tubes are stable.  The right PICC line is stable.  Persistent low lung volumes with vascular crowding and atelectasis.  Persistent left-sided pleural effusion and overlying atelectasis or infiltrate.  No edema.

[view not recorded]
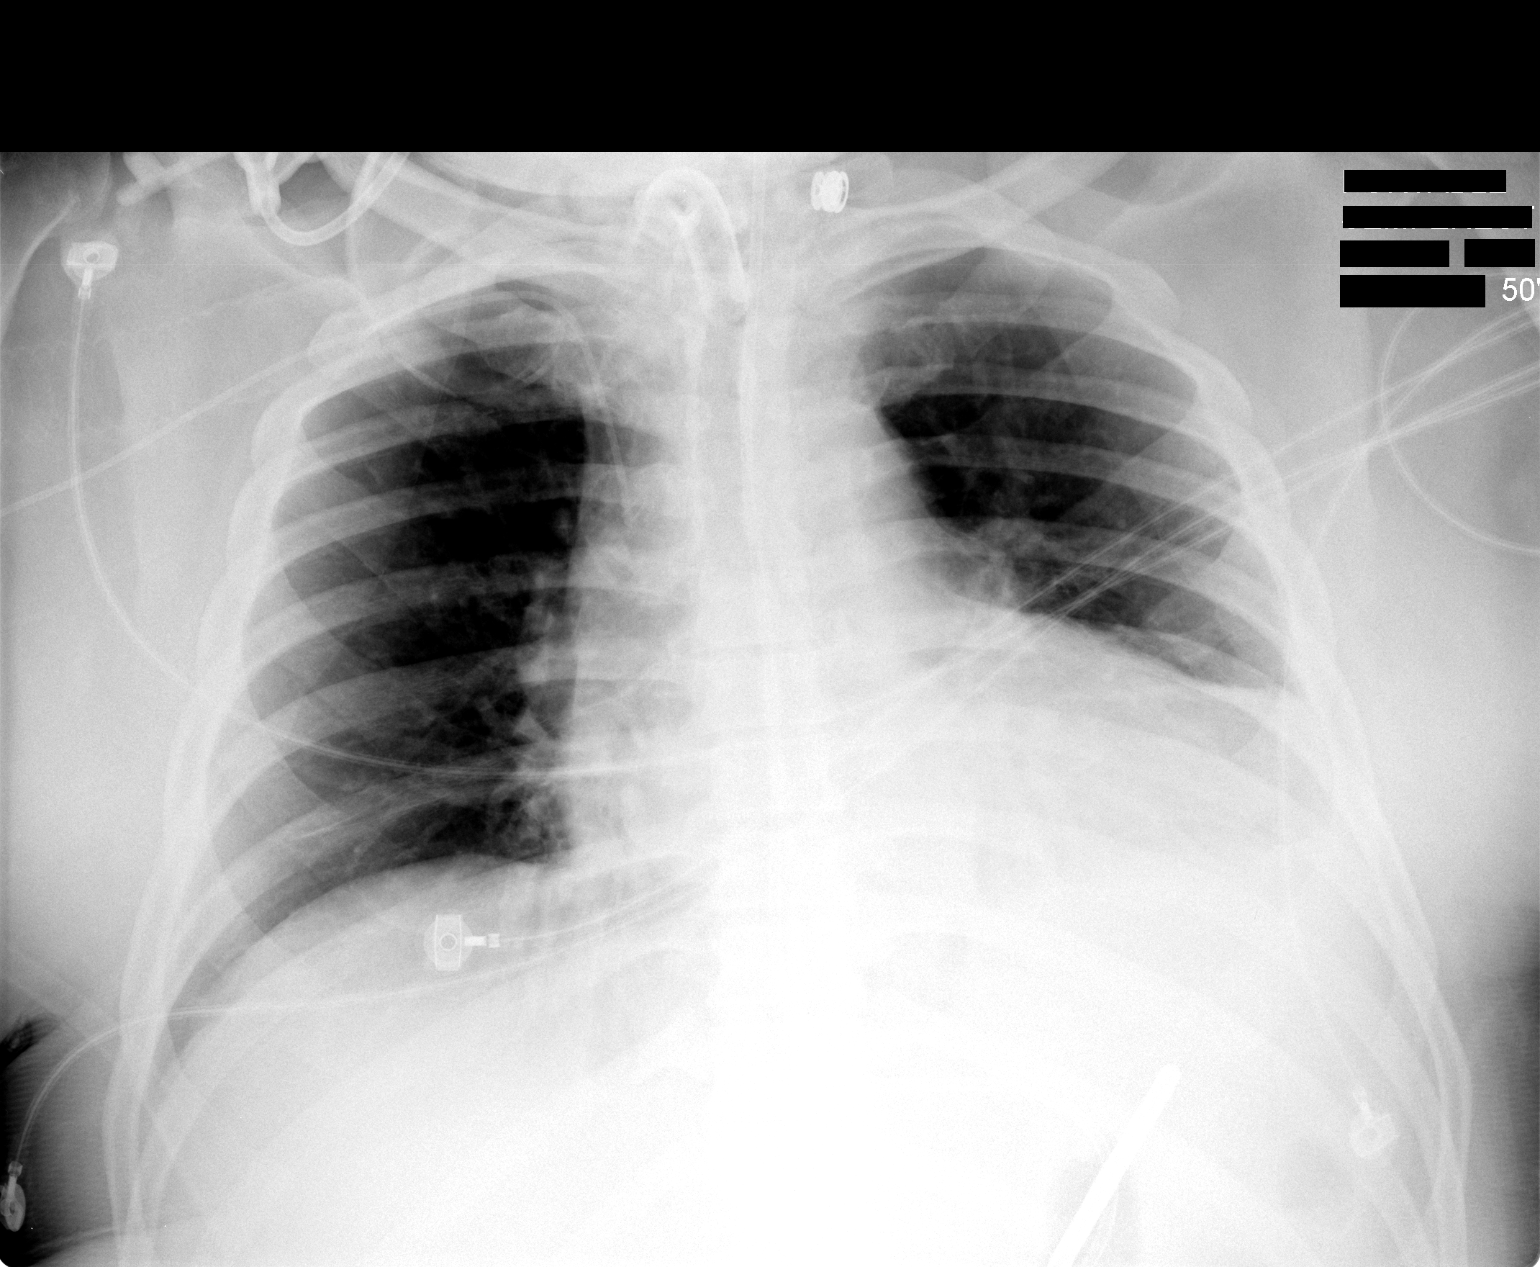

[1 of 1 positions shown; findings below may reference images not displayed]

IMPRESSION: 1.  Persistent low lung volumes with left basilar opacity, probably a combination of effusion, atelectasis and/or infiltrate.

## 2006-10-21 IMAGING — CR DG CHEST 1V PORT
1 series · 1 of 1 positions shown · non-contrast
Comparison: none

CLINICAL DATA: 58-year-old with renal failure; CHF.
 PORTABLE [id number] VIEW:
 Single portable view of the chest is compared to multiple previous studies, the most recent of which was 01/24/05.
 The support apparatus is stable.  Low volume chest film with vascular crowding and basilar atelectasis.  Probable left effusion.

[view not recorded]
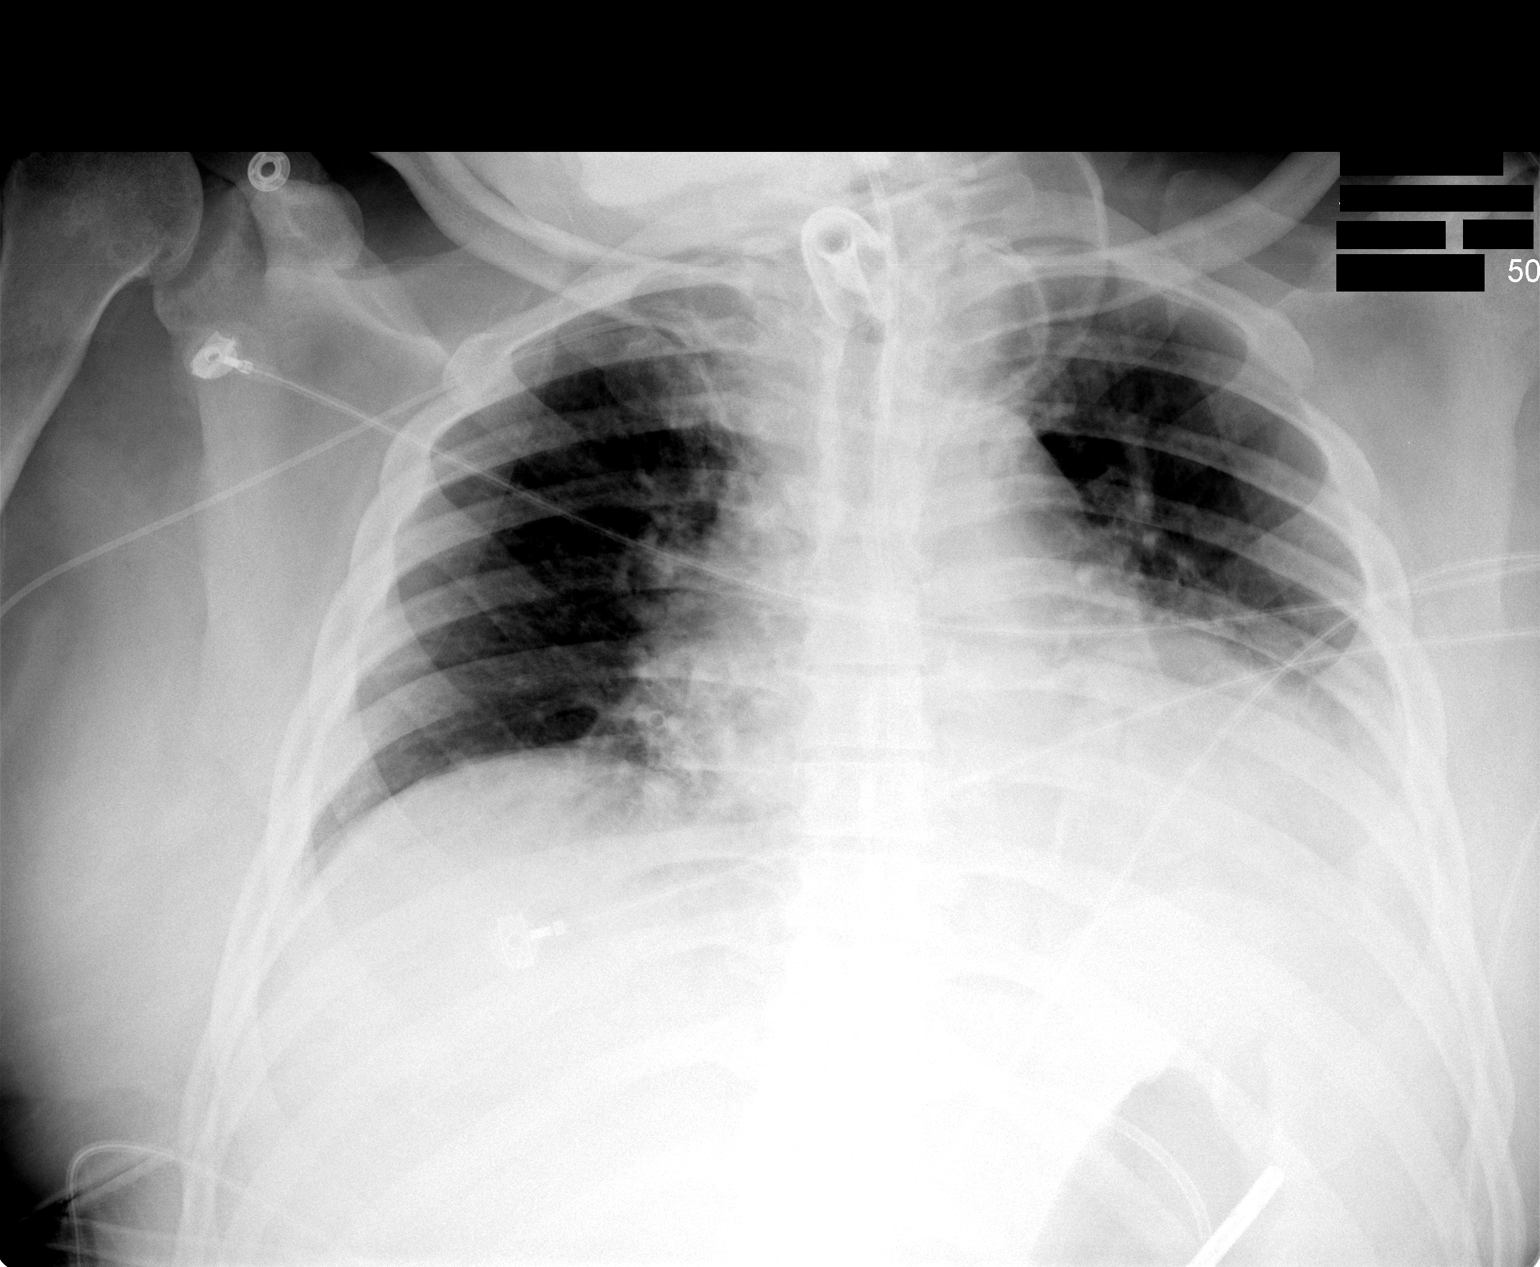

[1 of 1 positions shown; findings below may reference images not displayed]

IMPRESSION: Low volume chest film.  No significant change since the prior study with vascular congestion, atelectasis and possible effusion.

## 2006-10-25 IMAGING — CR DG SMALL BOWEL
10 series · 11 of 11 positions shown · non-contrast
Comparison: none

CLINICAL DATA: Enterocutaneous fistula.  Assess site of fistula.
GASTROGRAFIN SMALL BOWEL SERIES ? 01/31/05: 
The Panda feeding tube was noted to make a loop in the distal stomach with the tip pointing cephalad into the fundus.Arichi, [place]ock[place], manipulated the Panda tube and placed tip in the region of the pyloric channel.  Gastrografin was hand injected and fluoroscopic guidance of the contrast column was assessed.  It was difficult to detect a discrete enterocutaneous fistula however, it was noted that contrast readily entered the bag and at that point we marked the approximate site of the enterocutaneous fistula which I would call mid-jejunal level.  It is located just to the left of midline at approximately the level of the mid-sacroiliac joints.  The enterocutaneous fistula is basically at the site of where small bowel loops extend anteriorly into a hernia sac in the mid to upper pelvis.  A hemostat was placed at the site of the cutaneous aspect of the fistula during some of the filming.

[Series 1: run · 2 of 2 slices shown (1 of 6)]
[im 1/2]
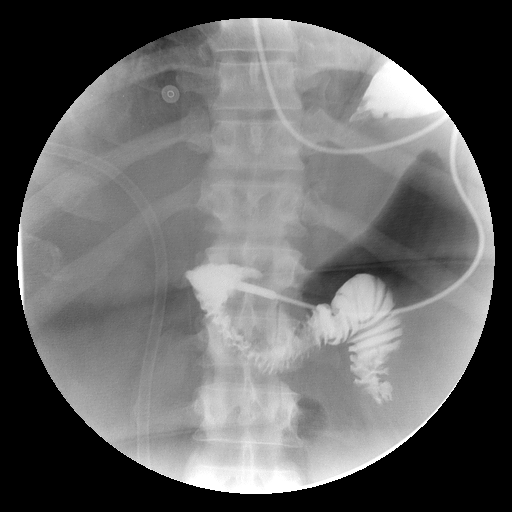
[im 2/2]
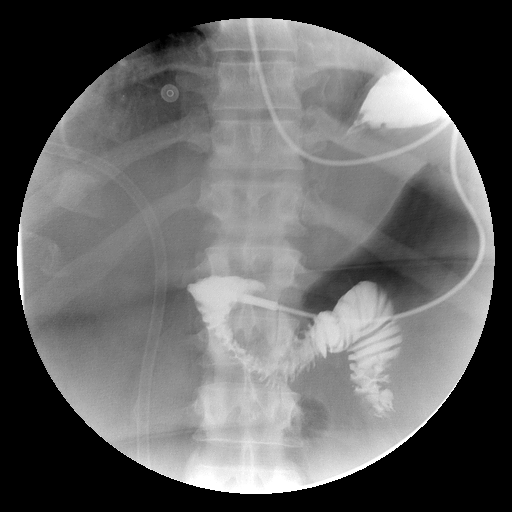

[run (2 of 6)]
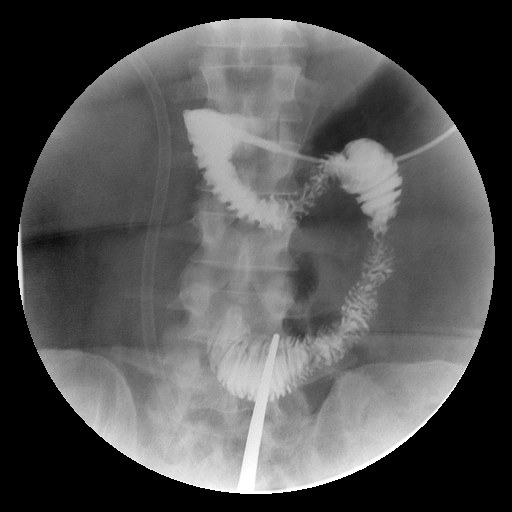

[run (3 of 6)]
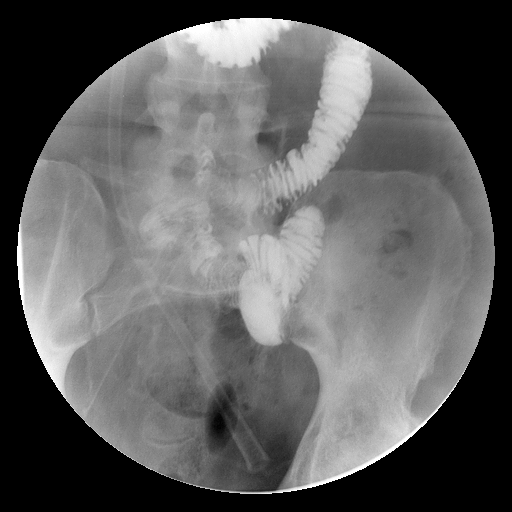

[run (4 of 6)]
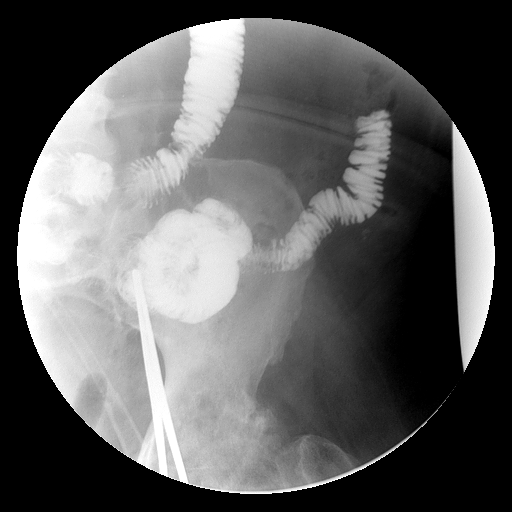

[run (5 of 6)]
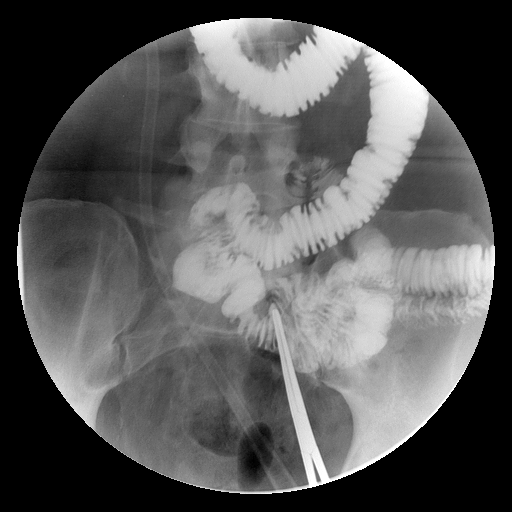

[run (6 of 6)]
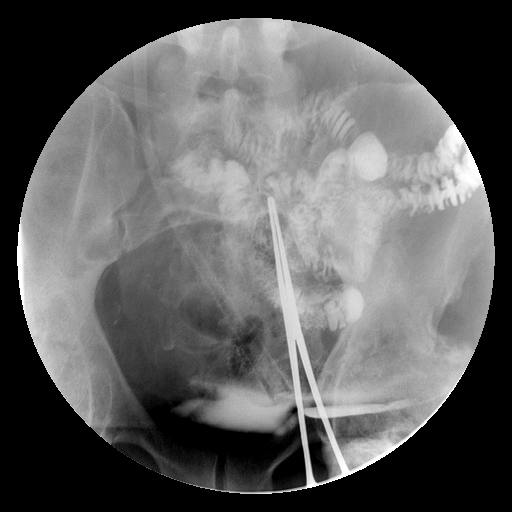

[view not recorded (1 of 4)]
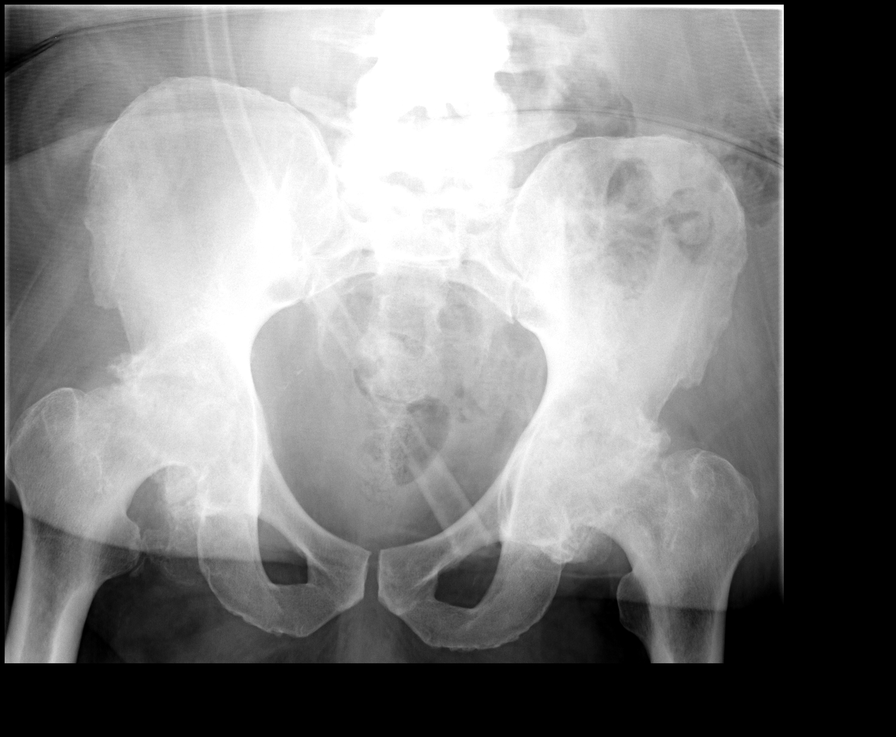

[view not recorded (2 of 4)]
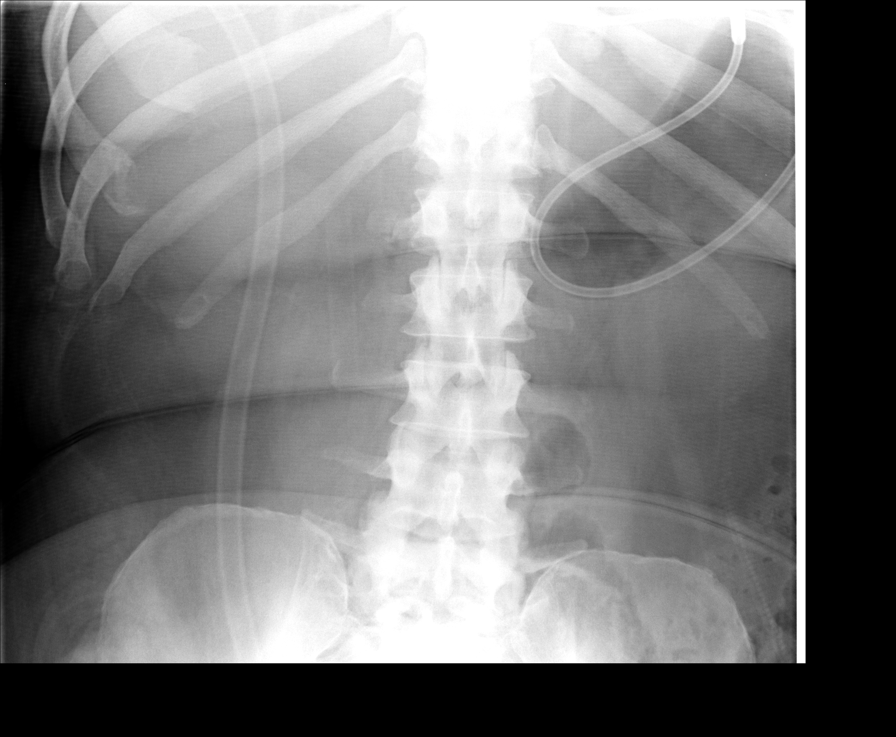

[view not recorded (3 of 4)]
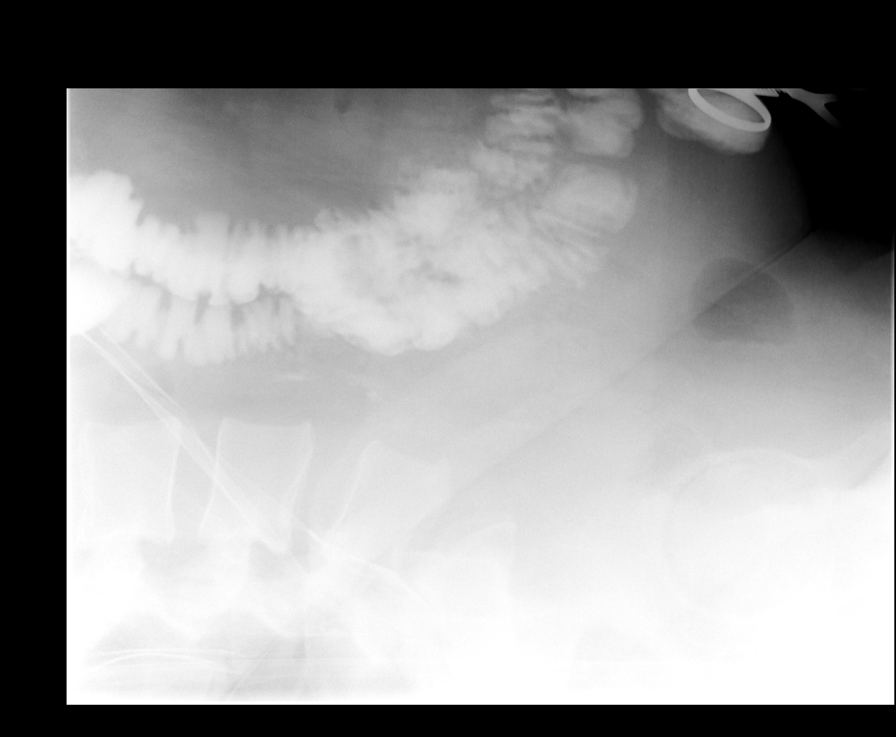

[view not recorded (4 of 4)]
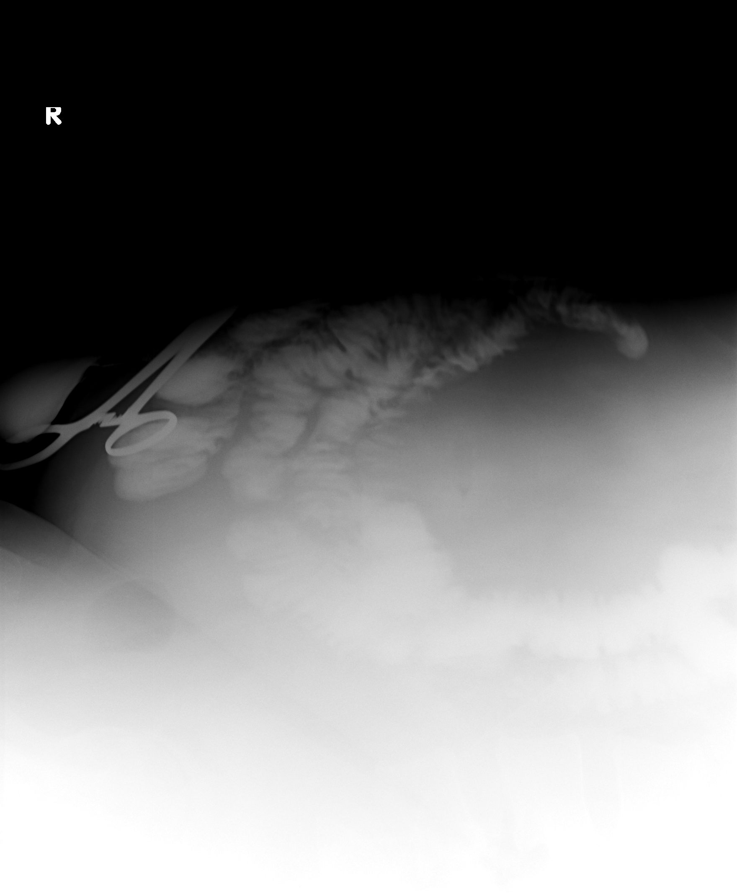

[11 of 11 positions shown; findings below may reference images not displayed]

IMPRESSION: Localization of the level of the enterocutaneous fistula was determined although the fistula itself is somewhat difficult to discretely visualize as far as a contrast channel.  Hopefully this study will be of assistance if repair is planned.

## 2006-10-25 IMAGING — CR DG CHEST 1V PORT
1 series · 1 of 1 positions shown · non-contrast
Comparison: Portable chest x-ray 01/27/2005.

CLINICAL DATA: Ventilator for respiratory failure. Acute renal failure. 

PORTABLE CHEST - 1 VIEW  [DATE]/3001 0100 hours:

[view not recorded]
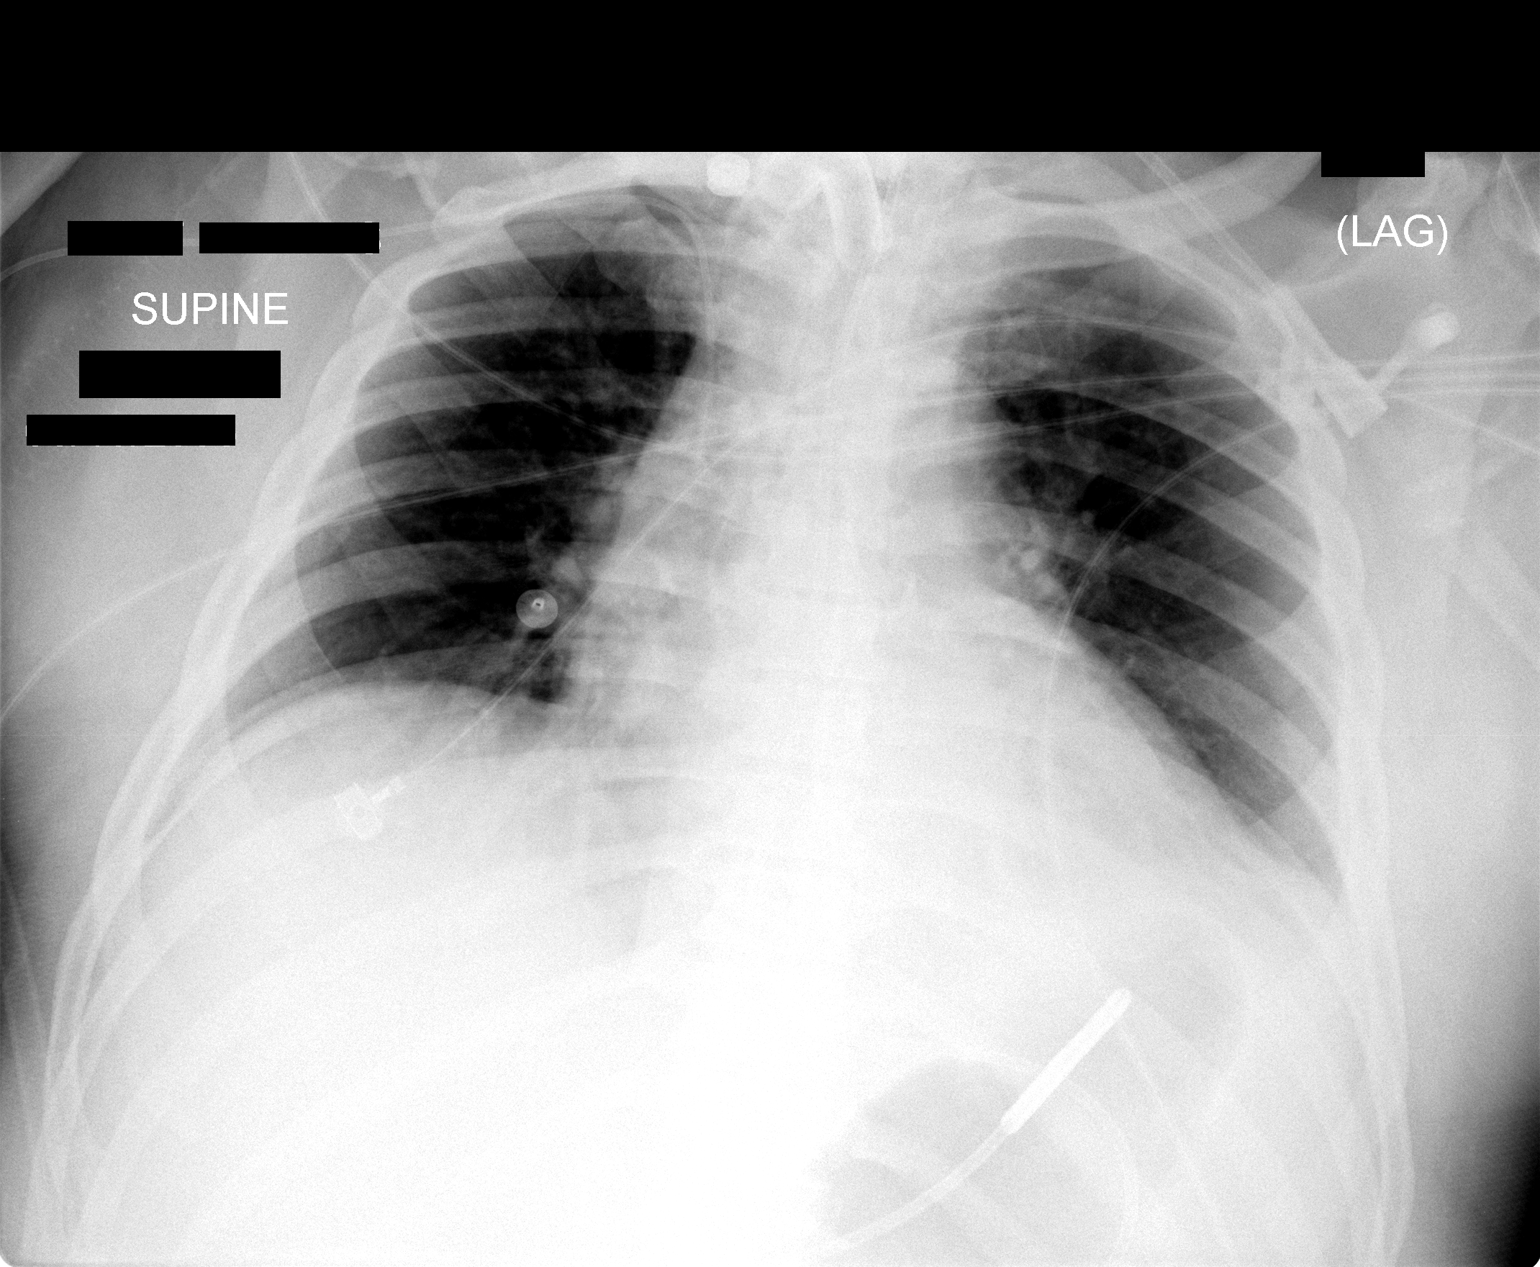

[1 of 1 positions shown; findings below may reference images not displayed]

FINDINGS: The tracheostomy tube remains in satisfactory position below the
thoracic inlet. The right arm PICC has its tip in the SVC. The feeding tube is
looped in the stomach with its tip in the fundus. Since the previous
examination, there has been improvement in aeration of the left base, with mild
atelectasis persisting. Aeration has also improved in the right base with no
significant residual atelectasis. There is no evidence of pulmonary edema. The
heart is mildly enlarged but stable.
IMPRESSION: Improved bibasilar atelectasis, with mild atelectasis persisting at the left
base. Stable cardiomegaly. No new abnormalities.

## 2006-10-31 IMAGING — CR DG CHEST 1V PORT
1 series · 1 of 1 positions shown · non-contrast
Comparison: 01/31/05.

CLINICAL DATA: Chest pain
PORTABLE CHEST - 1 VIEW ? 02/06/05 ? 4504 HOURS:

[view not recorded]
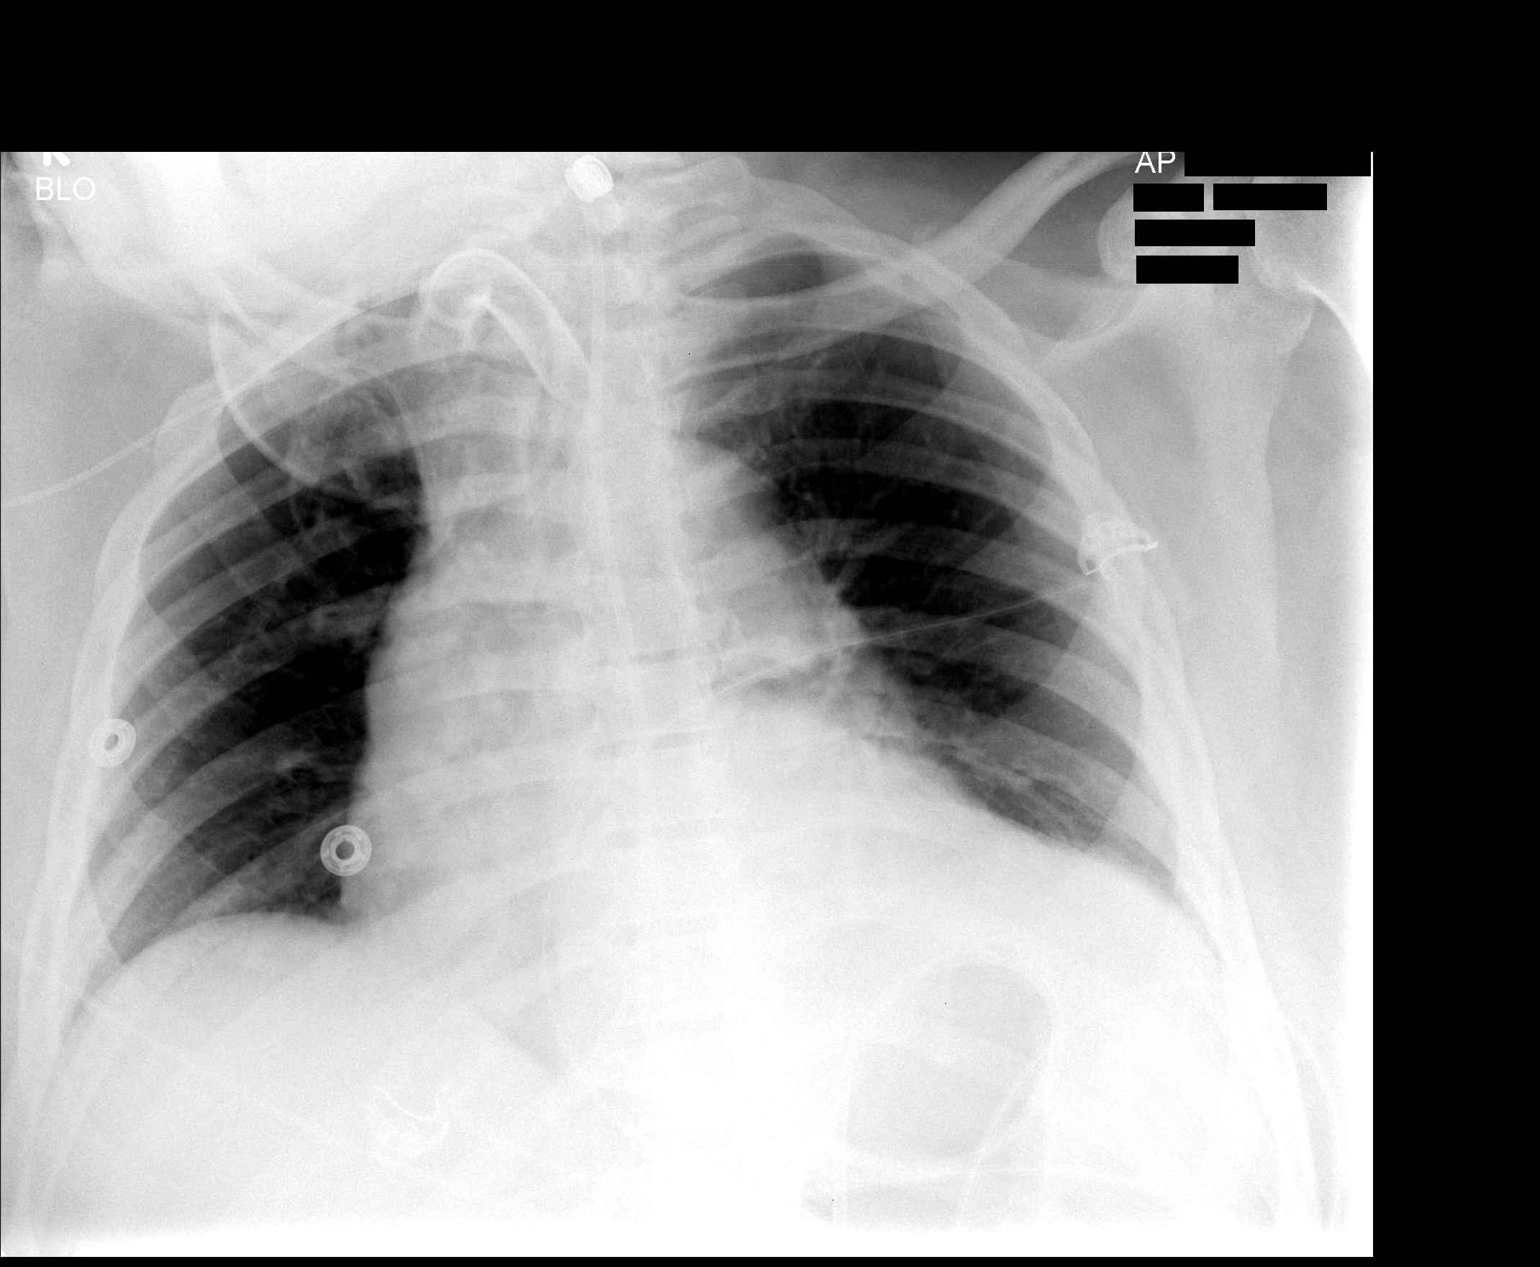

[1 of 1 positions shown; findings below may reference images not displayed]

FINDINGS: The tracheostomy tube and right upper extremity PICC are stable.  A feeding tube is present.  It is beyond the GE junction.  Bibasilar atelectasis is stable.  The pulmonary vasculature is within limits.  No pneumothoraces are effusions are seen.
IMPRESSION: No significant interval change.

## 2006-11-04 IMAGING — CR DG CHEST 1V PORT
1 series · 1 of 1 positions shown · non-contrast
Comparison: none

CLINICAL DATA: Fever.  CHF.  
 CHEST [id number] VIEW:
 There is left base air space disease.  Right lung is clear.  Tracheostomy tube.  Cardiomegaly.  Feeding tube again noted.

[view not recorded]
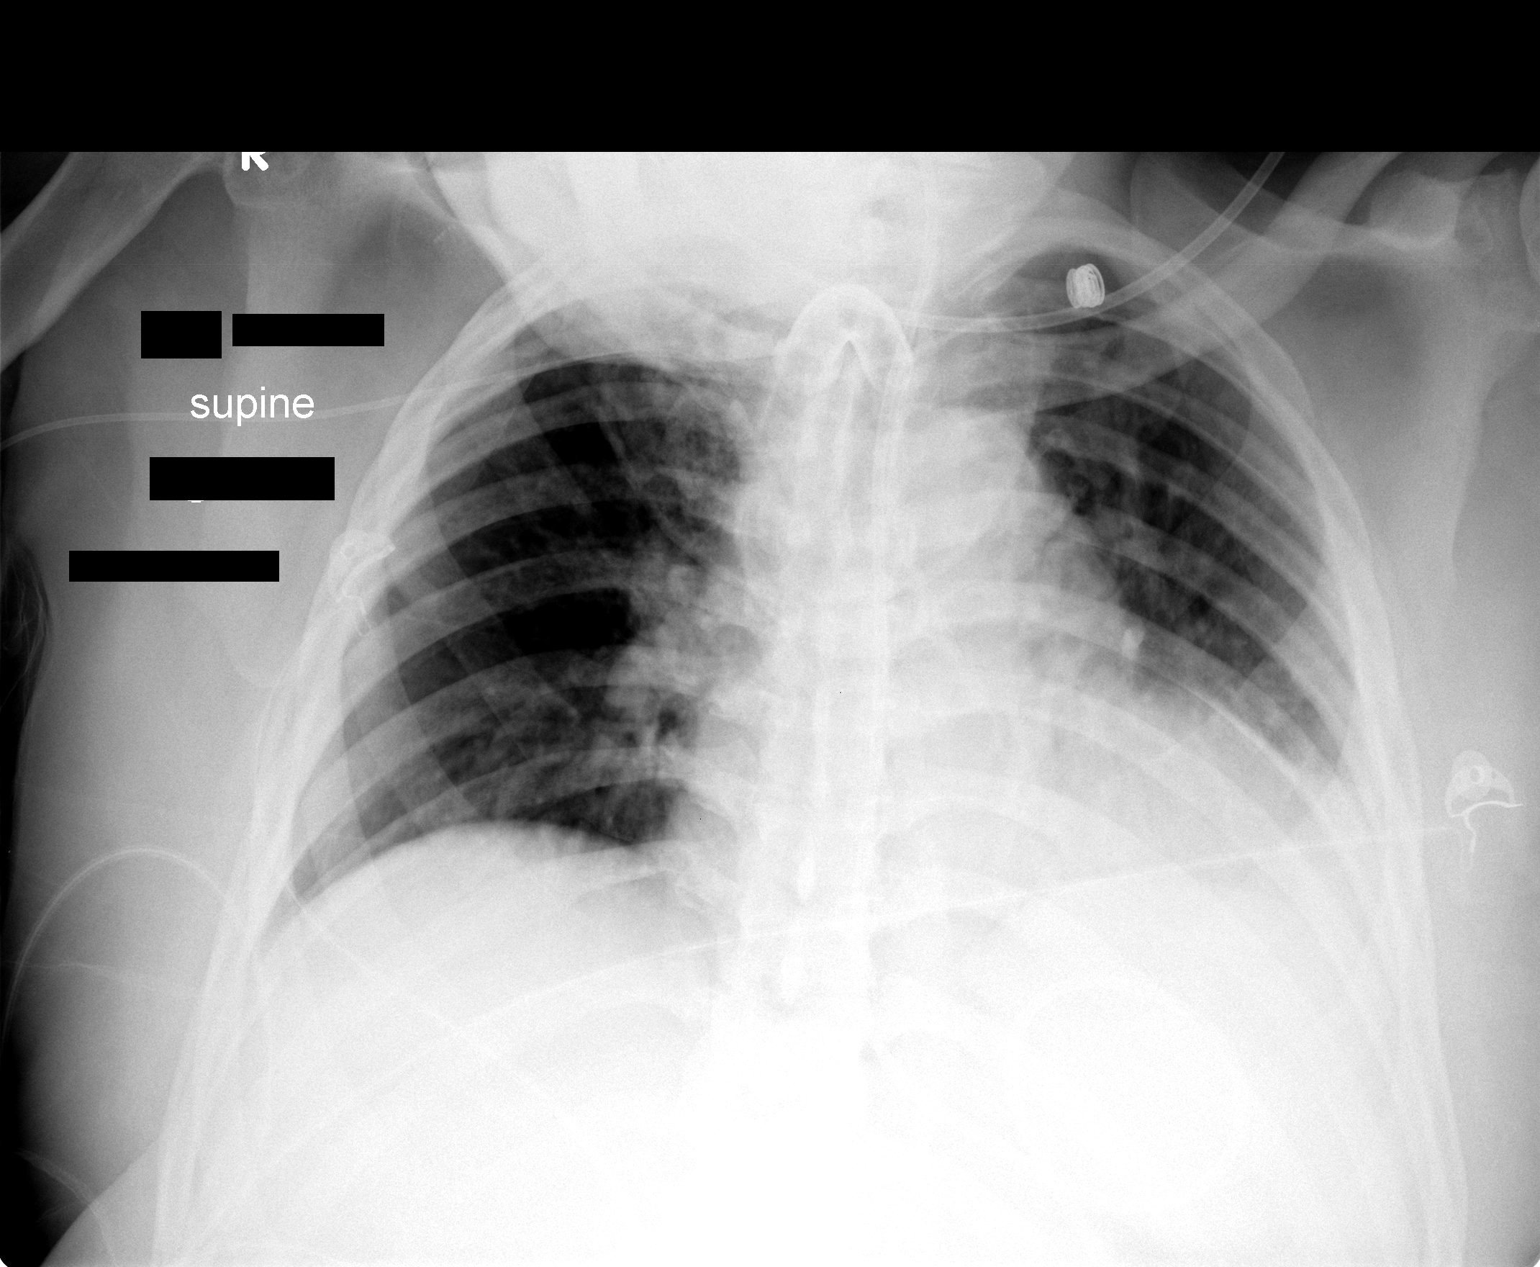

[1 of 1 positions shown; findings below may reference images not displayed]

IMPRESSION: Persistent left base air space disease without marked change.

## 2006-11-04 IMAGING — CR DG CHEST 1V PORT
1 series · 1 of 1 positions shown · non-contrast
Comparison: 02/10/05: 
 The new left PICC line tip is in the region of the right atrium.  The PICC line should be pulled back 3-4 cm.  No pneumothorax.

CLINICAL DATA: Acute renal failure.  PICC line insertion.
 PORTABLE CHEST - 1 VIEW - 02/10/05:

[view not recorded]
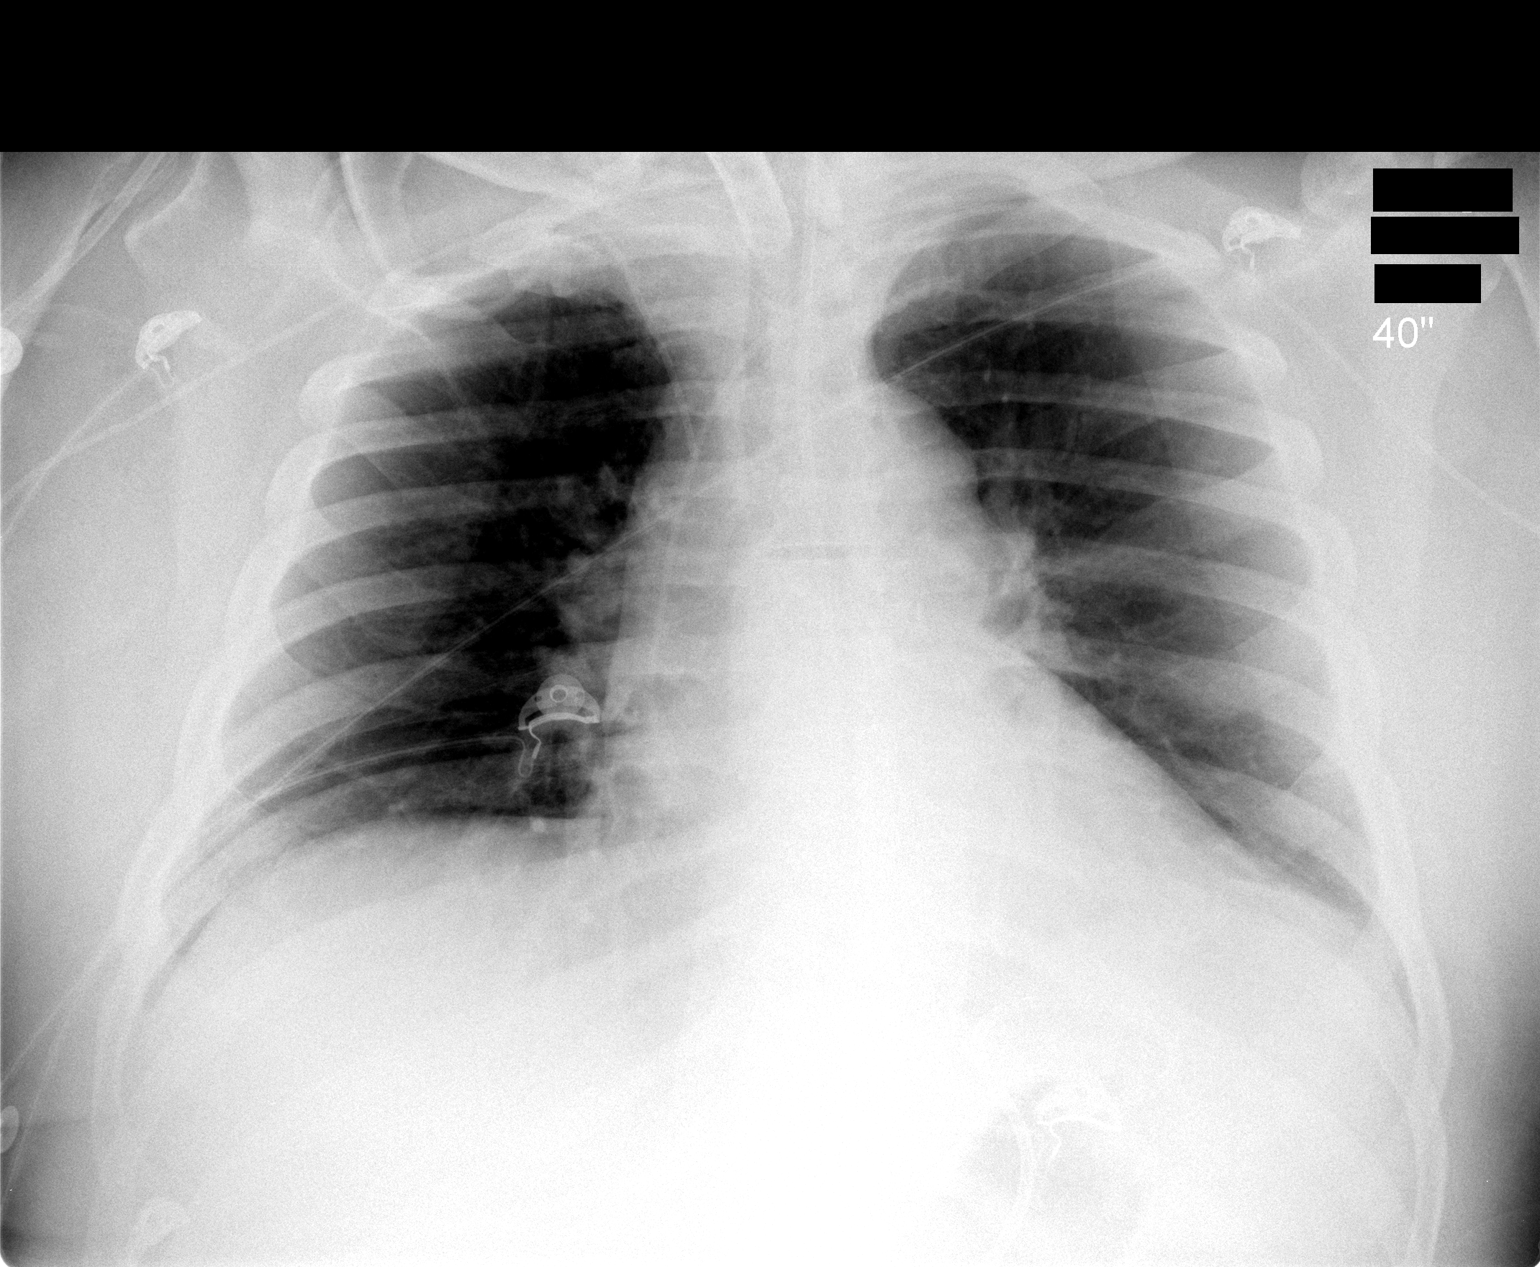

[1 of 1 positions shown; findings below may reference images not displayed]

IMPRESSION: The PICC line tip is in the right atrium.

## 2006-11-08 IMAGING — CR DG CHEST 1V PORT
1 series · 1 of 1 positions shown · non-contrast
Comparison: 02/10/05.

CLINICAL DATA: Renal failure and CHF.
 PORTABLE [id number] VIEW (1353 hours):

[view not recorded]
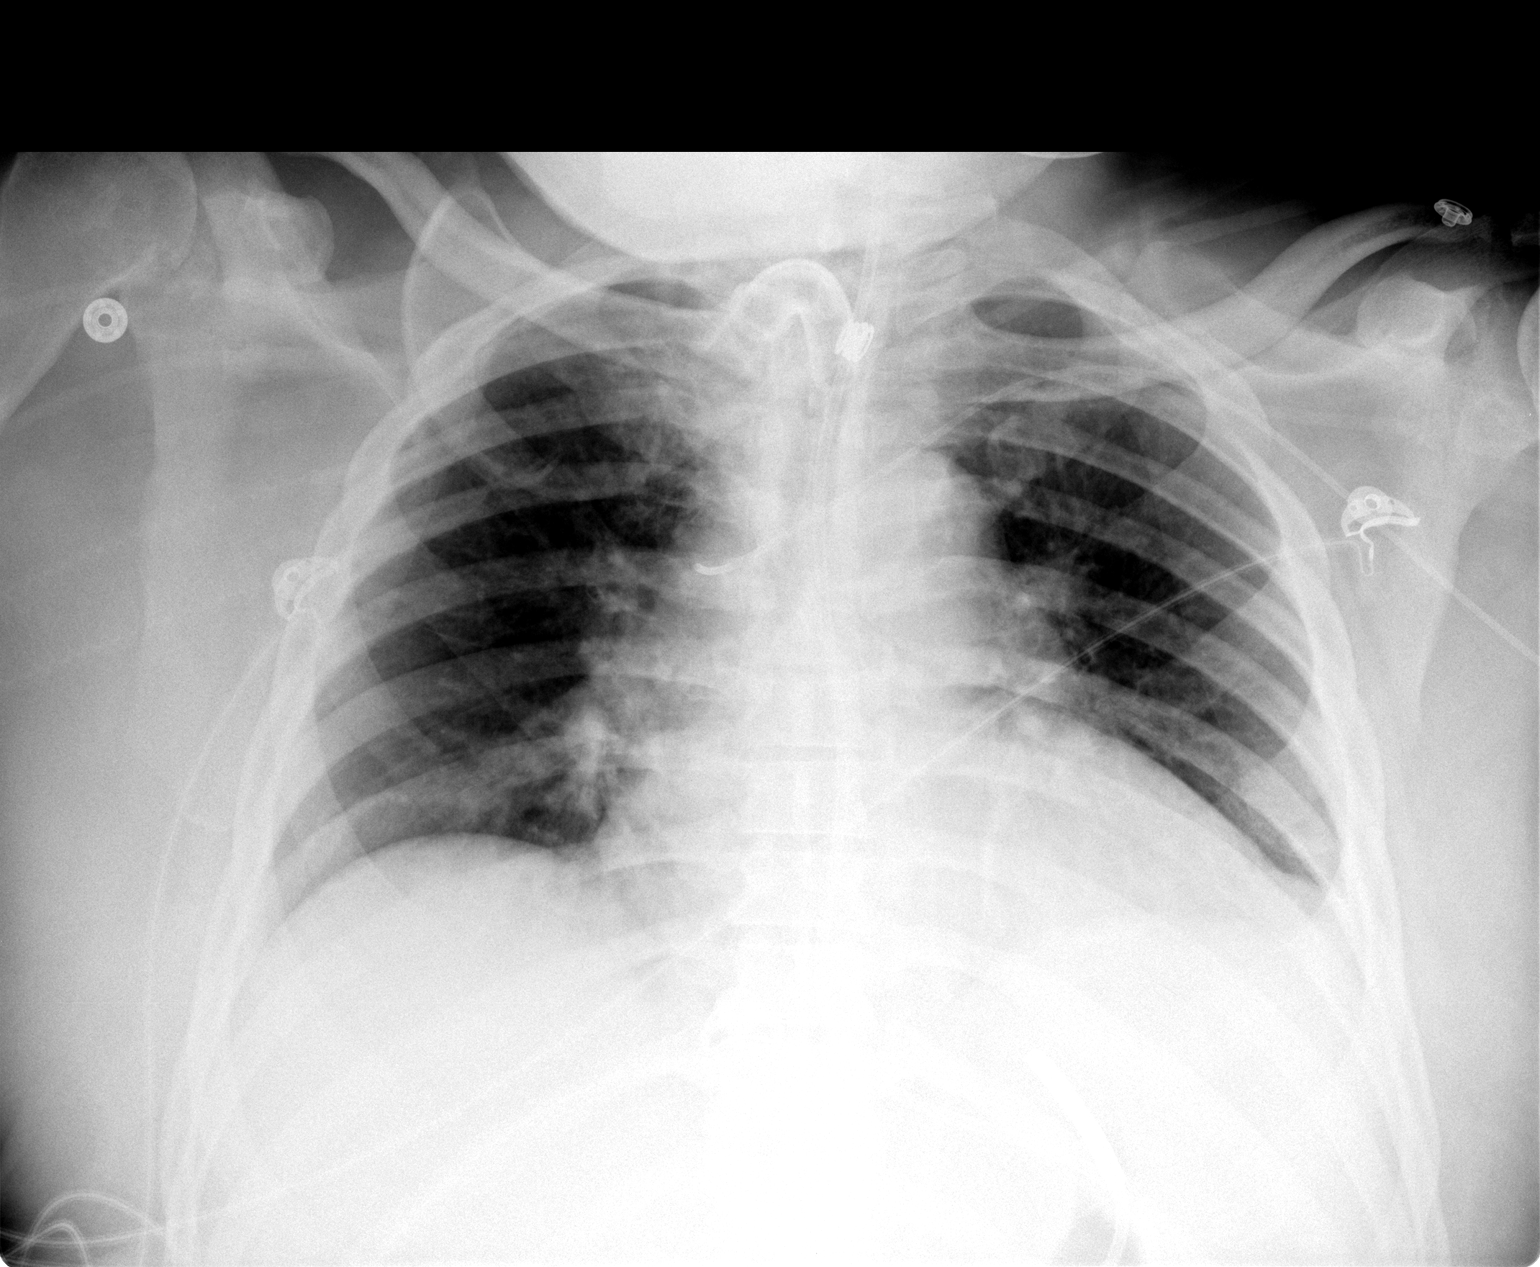

[1 of 1 positions shown; findings below may reference images not displayed]

There is some atelectasis at the left lung base.  No edema. Stable cardiomegaly. PICC line has been retracted with the tip lying in the expected position of the upper SVC.  The tip is slightly curved potentially at the orifice of the azygos vein.
IMPRESSION: Mild atelectasis at the left lung base.  No overt edema.

## 2006-11-10 IMAGING — CT CT PELVIS W/ CM
1 of 3 series · 13 of 32 positions shown, 18 images · IV contrast (omnipaque)
Comparison: Unenhanced CT of the abdomen and pelvis done 01/11/05.

CLINICAL DATA: History of colonic perforation and enterocutaneous fistula.  Question abscess. 
ABDOMEN CT WITH CONTRAST:
TECHNIQUE: Multidetector CT imaging of the abdomen was performed following the standard protocol with oral contrast and during bolus administration of intravenous contrast.
Contrast:  125 cc Omnipaque 300.  Oral contrast was given.
TECHNIQUE: Multidetector CT imaging of the pelvis was performed following the standard protocol with oral contrast and during bolus administration of intravenous contrast.
Contrast is noted anterior to the skin in the infraumbilical region compatible with a persistent enterocutaneous fistula.  No intraabdominal fluid collection is seen.  The bladder is decompressed by a Foley catheter.  There are advanced degenerative changes of both hips with multiple intraarticular loose bodies.

[Series 2: abd_pel 5.0 b40s st · axial · 0.89mm/px · z∈[-467,-22]mm · 13 of 101 slices shown, 18 images]
[im 6/101  soft-tissue]
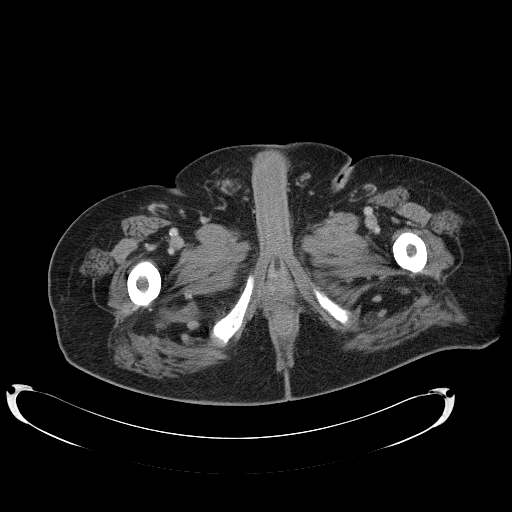
[im 6/101  bone]
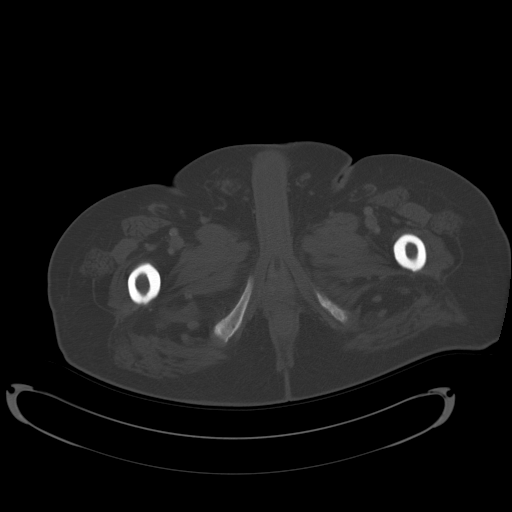
[im 16/101  soft-tissue]
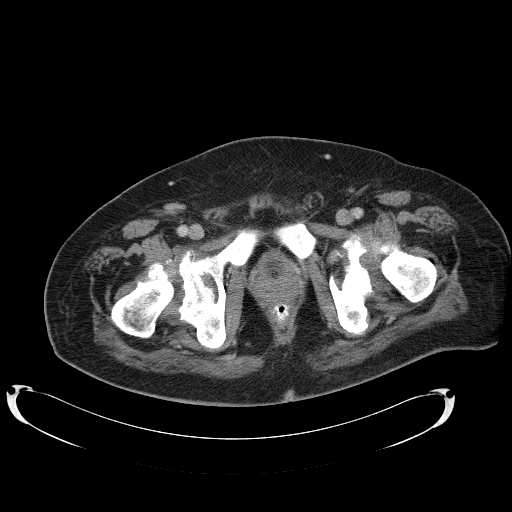
[im 22/101  soft-tissue]
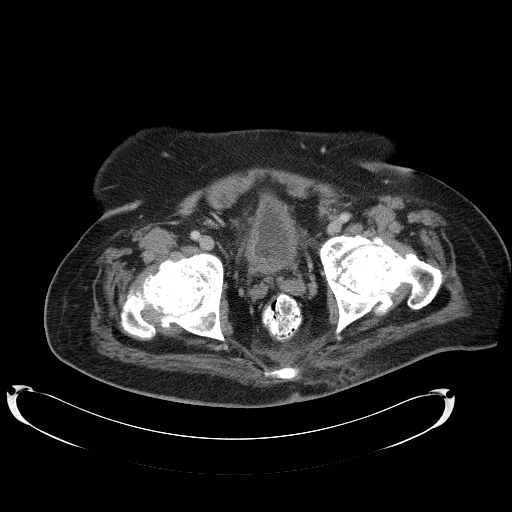
[im 32/101  soft-tissue]
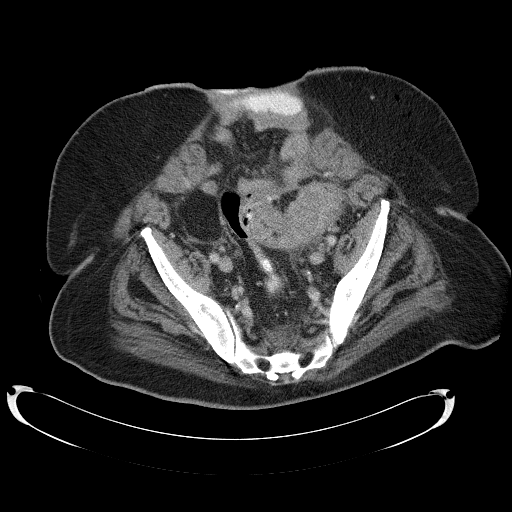
[im 37/101  soft-tissue]
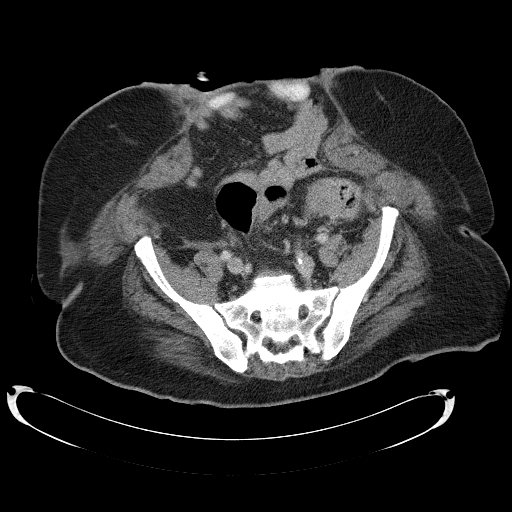
[im 48/101  soft-tissue]
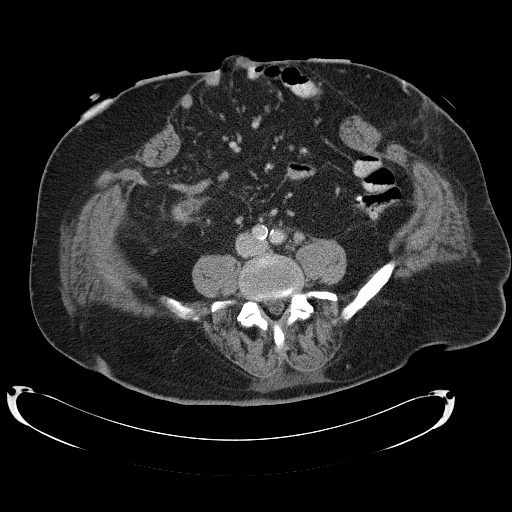
[im 53/101  soft-tissue]
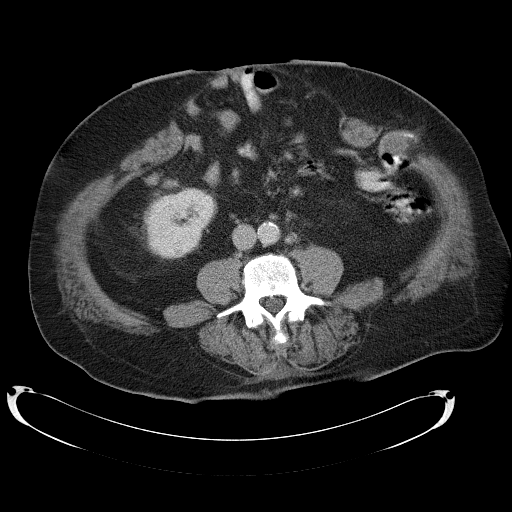
[im 64/101  soft-tissue]
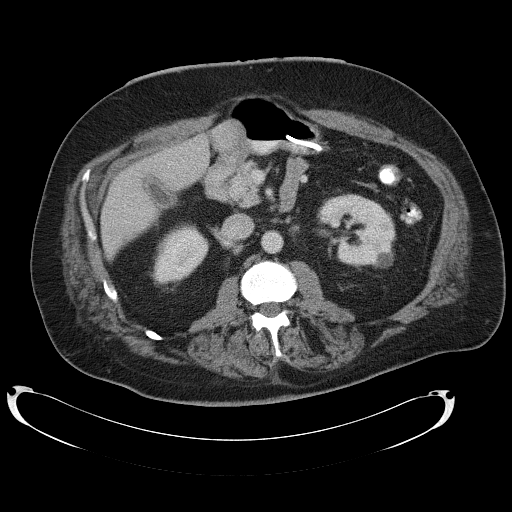
[im 69/101  soft-tissue]
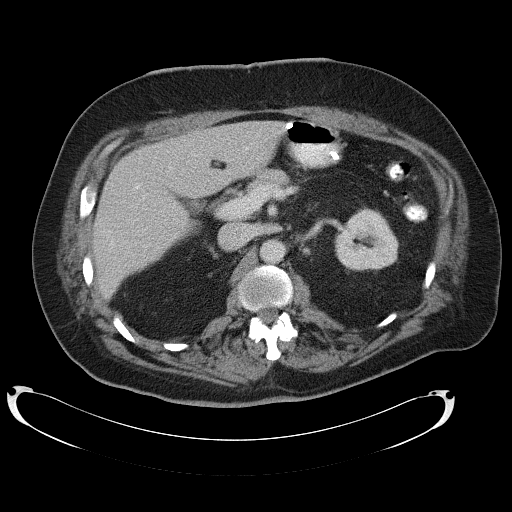
[im 69/101  bone]
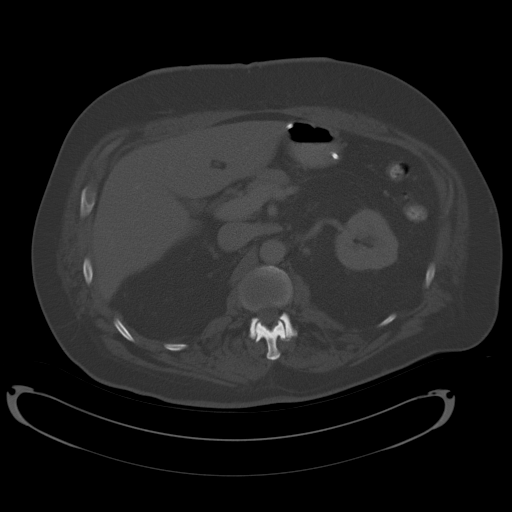
[im 79/101  soft-tissue]
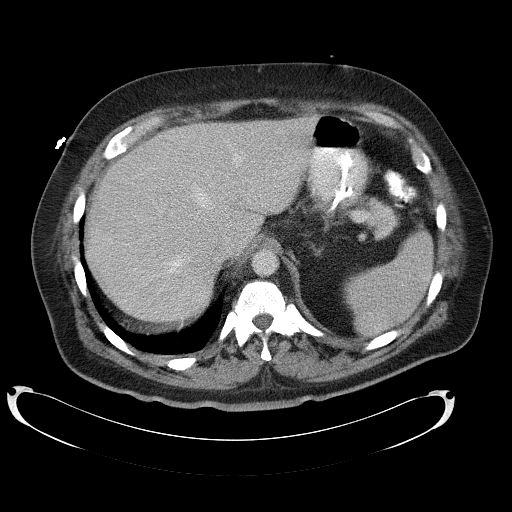
[im 79/101  lung]
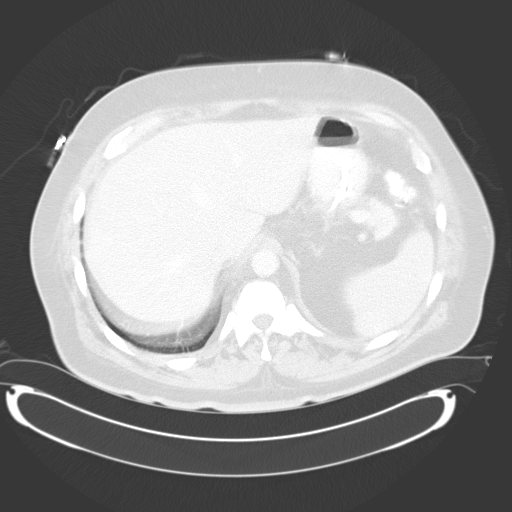
[im 85/101  soft-tissue]
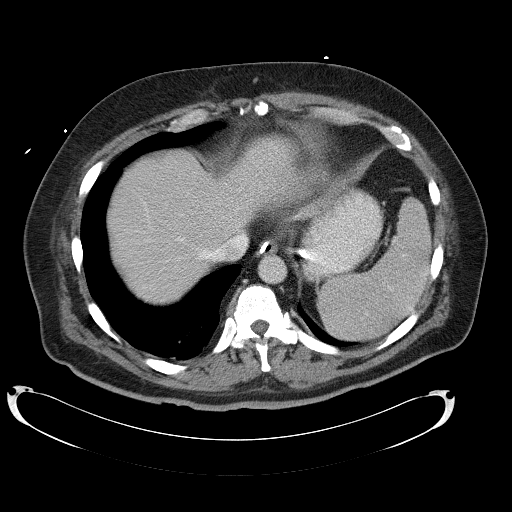
[im 85/101  lung]
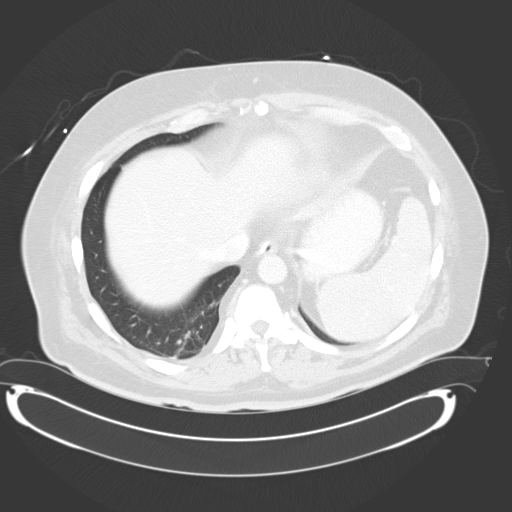
[im 90/101  lung]
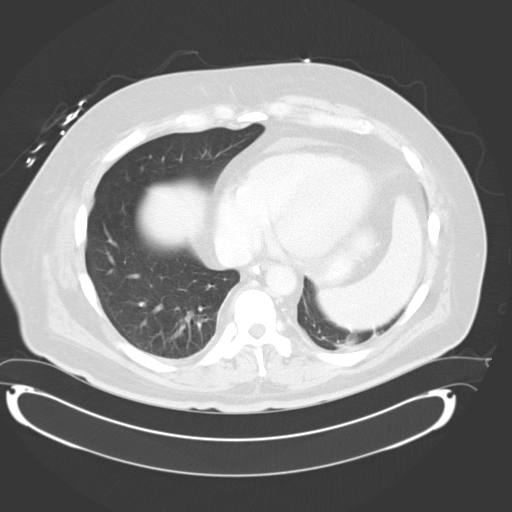
[im 95/101  soft-tissue]
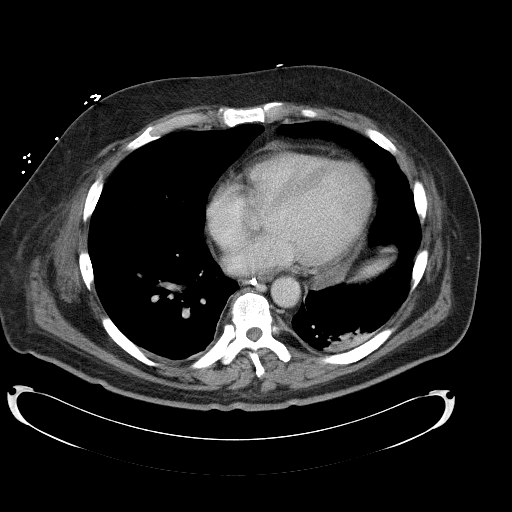
[im 95/101  lung]
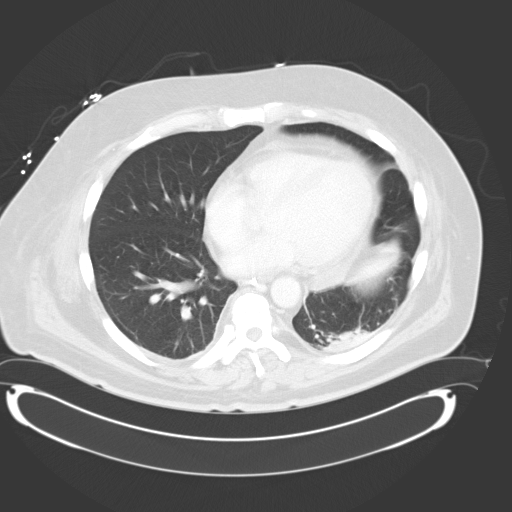

[13 of 32 positions shown; findings below may reference images not displayed]

FINDINGS: There has been interval improved aeration of both lung bases with mild atelectasis in the left lower lobe.  A small amount of pericardial fluid remains. The feeding tube remains looped in the stomach with its tip in the fundus.  Again demonstrated is a broad based hernia of the anterior abdominal wall with a probable enterocutaneous fistula inferior to the umbilicus.  Left lower quadrant colostomy appears stable.  No intraperitoneal fluid collections were seen.  
  The gallbladder is contracted with mild wall thickening, but no apparent surrounding inflammation.  The liver, spleen, pancreas, and right adrenal gland appear normal.  There is stable fullness of the left adrenal gland.  This demonstrated low attenuation on the prior unenhanced study and may reflect hyperplasia or small adenoma.  Both kidneys excrete contrast on the delayed images.  There is a stable simple cyst projecting posteriorly from the lower pole of the left kidney.  Within the mid right kidney, there is a 1.8 cm lesion with somewhat ill defined margins.  I reviewed the patient?s prior unenhanced CTs and I believe there is a probable hemorrhagic cyst in this area demonstrating some increased density on a CT done 10/21/04.  This was not visualized on the renal ultrasound from 10/20/04, and I would suggest ultrasound followup.
IMPRESSION: 1.  Compared with the patient?s prior study from five weeks ago, the intraabdominal inflammatory changes and pleural effusions have improved.  There is no evidence of intraabdominal abscess or perforation. 
2.  Stable broad based anterior abdominal wall hernia with probable infraumbilical enterocutaneous fistula.  Correlate clinically. 
3.  Probable hemorrhagic cyst in the midright kidney.  Recommend ultrasound followup. 
PELVIS CT WITH CONTRAST:
IMPRESSION: No evidence of pelvic abscess.  Persistent anterior enterocutaneous fistula suspected.

## 2006-11-16 IMAGING — CR DG BE THRU COLOSTOMY
14 of 17 series · 14 of 17 positions shown · non-contrast
Comparison: none

CLINICAL DATA: The patient has ileostomy.  Acute renal failure.
 RETROGRADE ILEOSTOMY STUDY:

[run (1 of 11)]
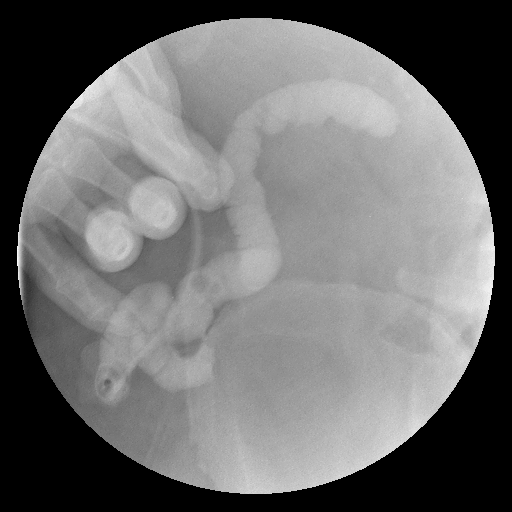

[run (2 of 11)]
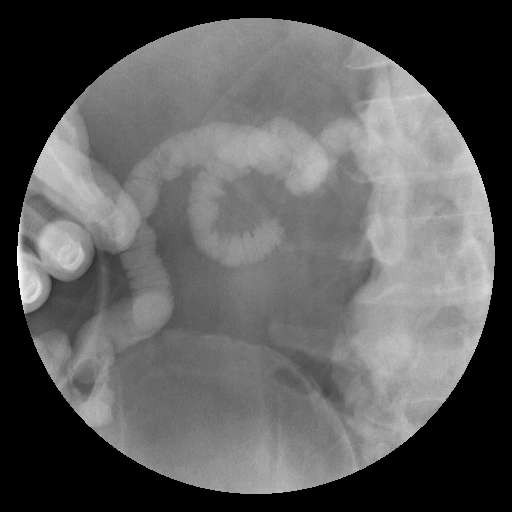

[run (3 of 11)]
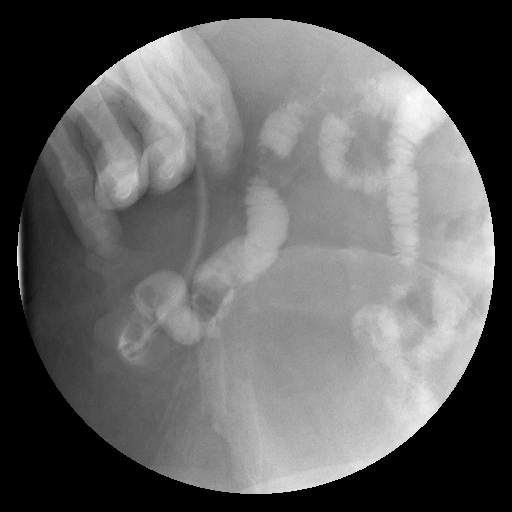

[run (4 of 11)]
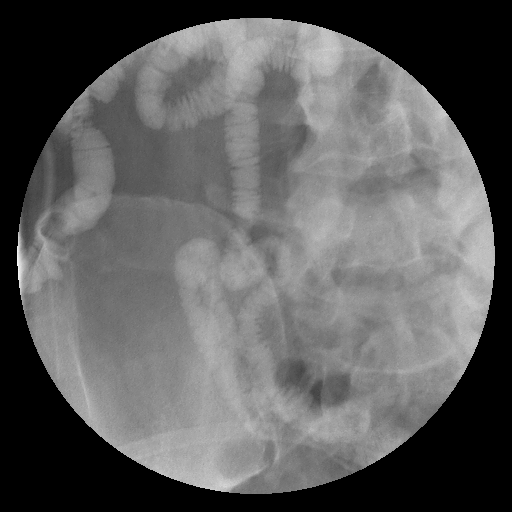

[run (5 of 11)]
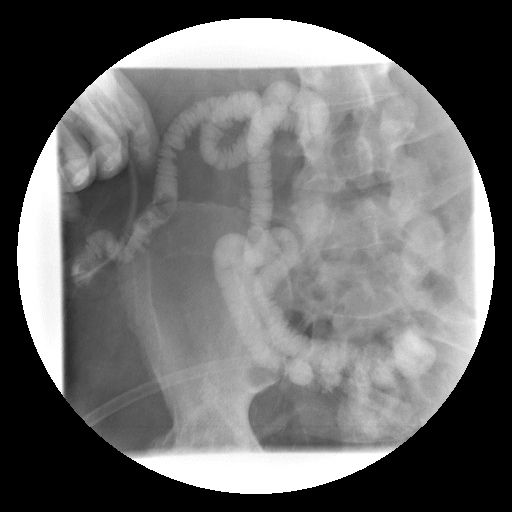

[run (6 of 11)]
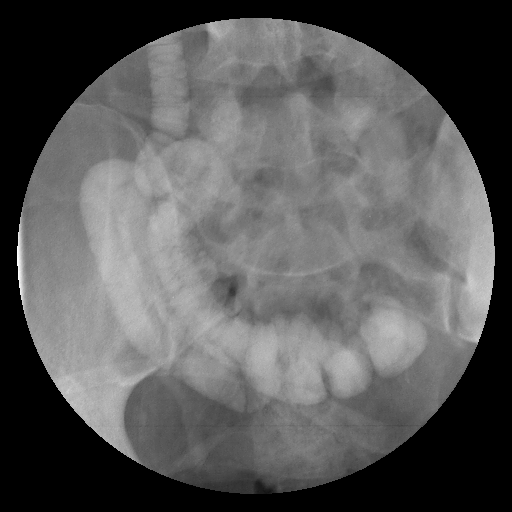

[run (7 of 11)]
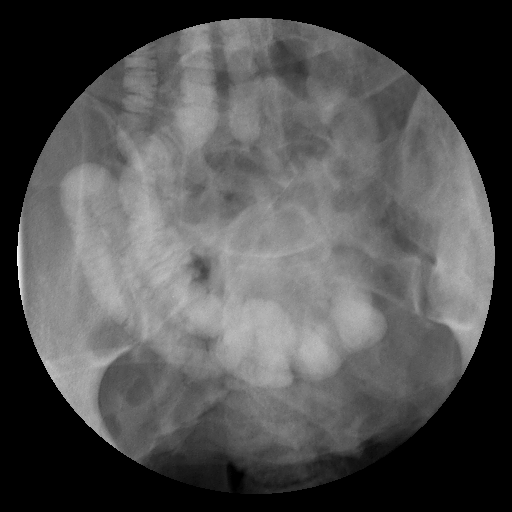

[run (8 of 11)]
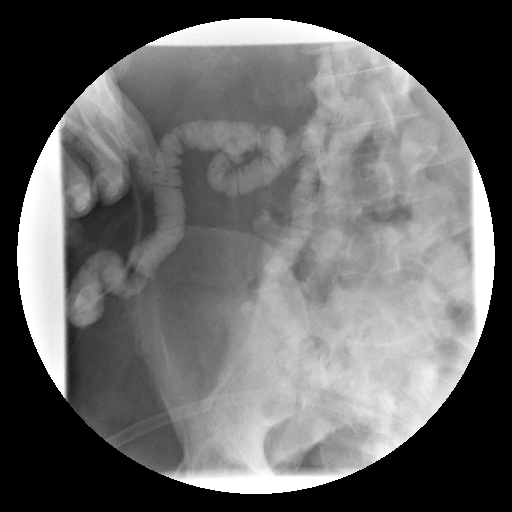

[run (9 of 11)]
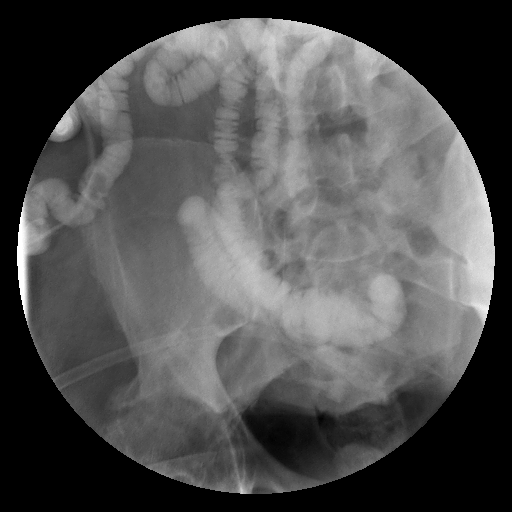

[run (10 of 11)]
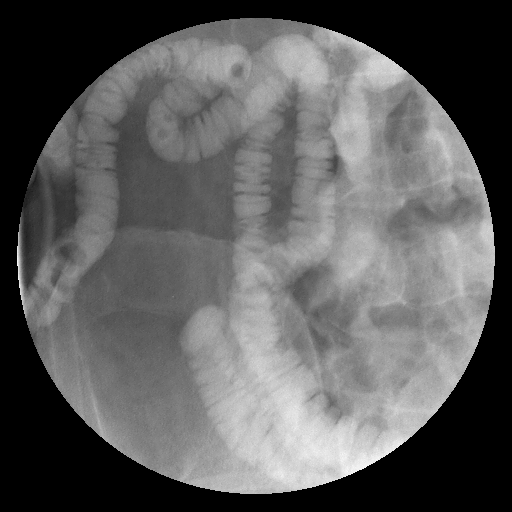

[run (11 of 11)]
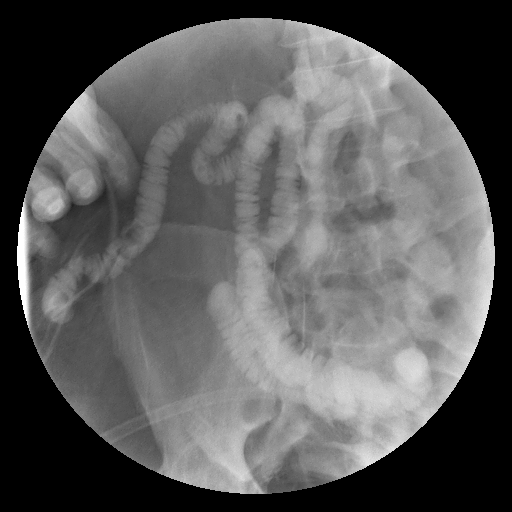

[view not recorded (1 of 3)]
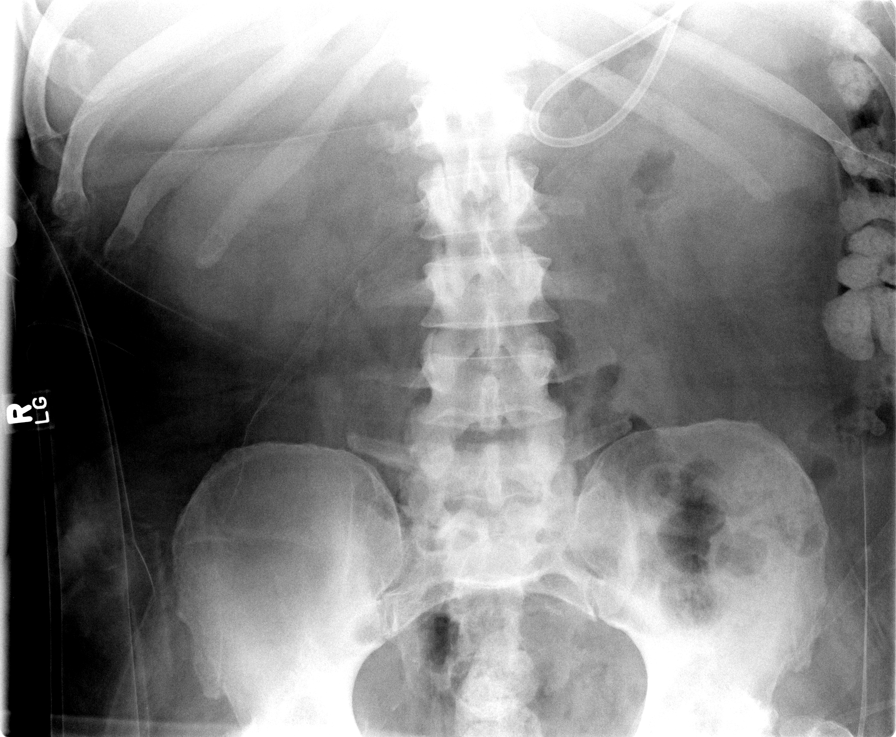

[view not recorded (2 of 3)]
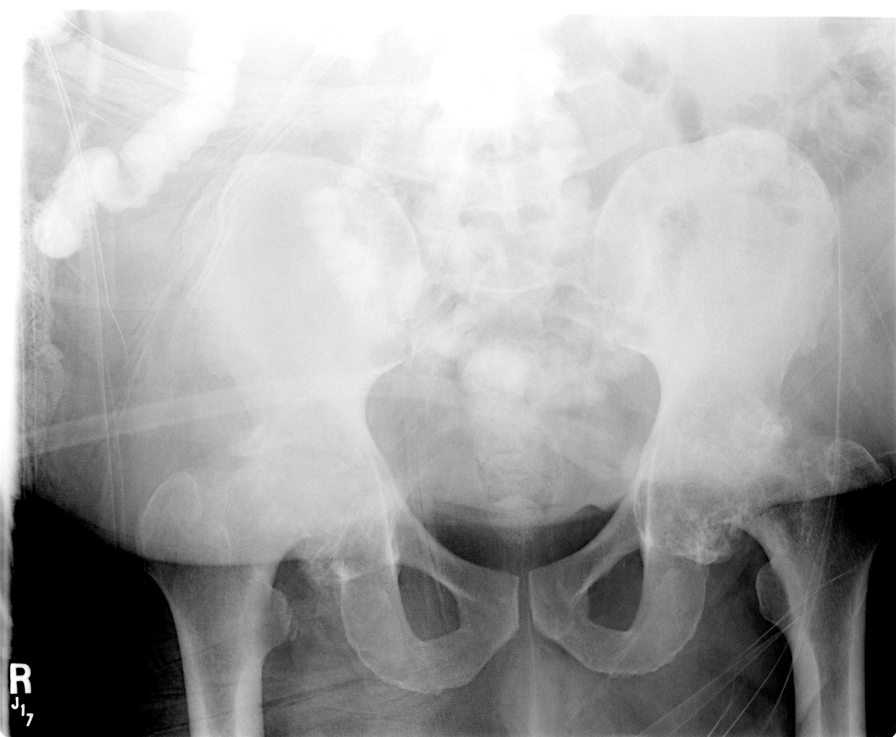

[view not recorded (3 of 3)]
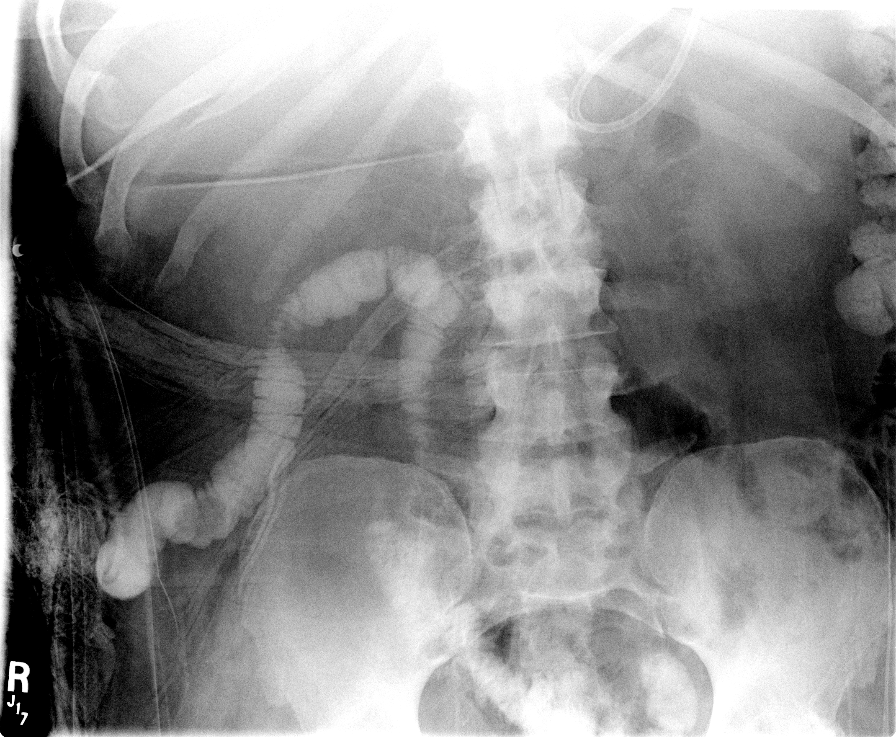

[14 of 17 positions shown; findings below may reference images not displayed]

FINDINGS: This study was performed with diluted Hypaque.  There is satisfactory filling of the distal portion of the ileum.  The mucosa appears to be normal without areas of stricture formation.  There is noted to be extravasation of contrast through the enterocutaneus fistula in the region of the mid pelvic area.  No small bowel is refluxed proximal to the region of the pelvis.  
 Some retained feces noted throughout the small bowel visualized.
IMPRESSION: No abnormality of the small bowel proximal to the ileostomy with extravasation of contrast in the region of the enterocutaneous fistula in the region of the mid pelvis.

## 2006-11-19 IMAGING — CR DG ABD PORTABLE 1V
1 series · 1 of 1 positions shown · non-contrast
Comparison: none

CLINICAL DATA: Panda placement.  
 PORTABLE ABDOMEN ? 1 VIEW:

[view not recorded]
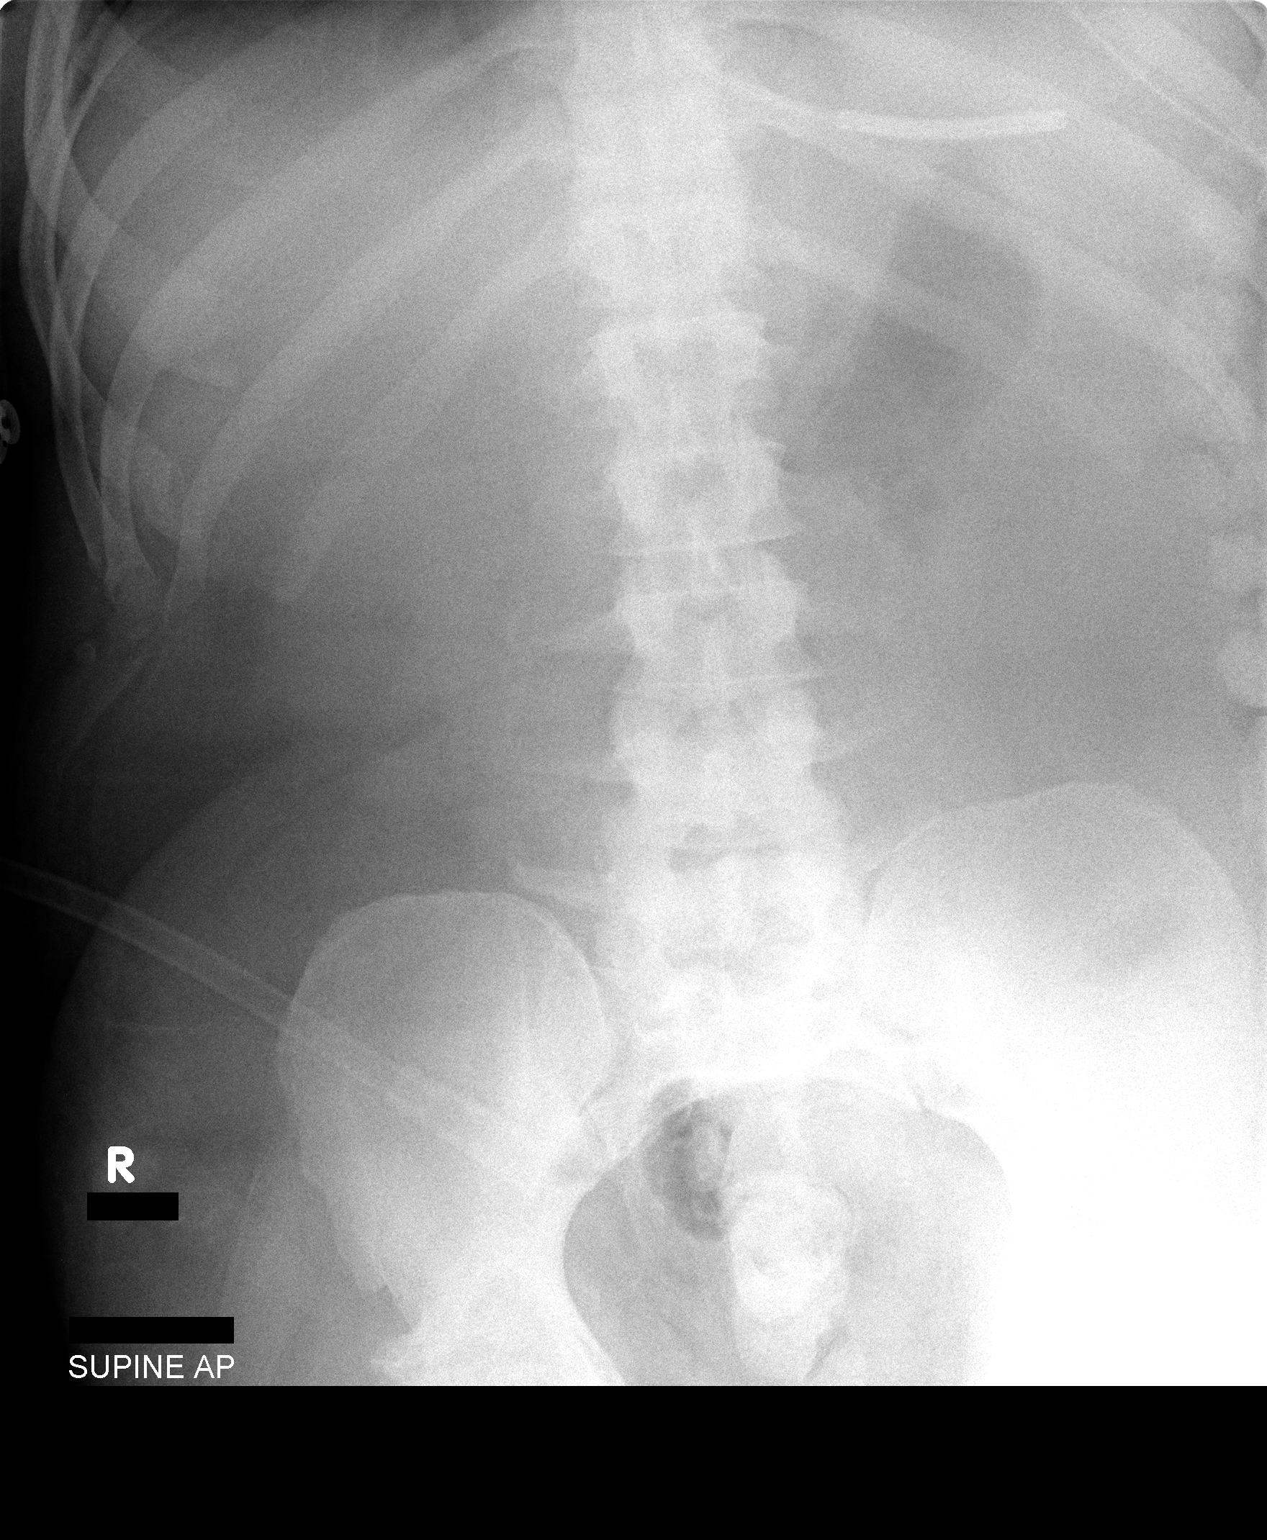

[1 of 1 positions shown; findings below may reference images not displayed]

FINDINGS: The tip of a panda tube is positioned in the fundus of the stomach.
IMPRESSION: As above.

## 2006-11-19 IMAGING — CR DG ABD PORTABLE 1V
1 series · 1 of 1 positions shown · non-contrast
Comparison: Earlier the same day.

CLINICAL DATA: Feeding tube placement.

[view not recorded]
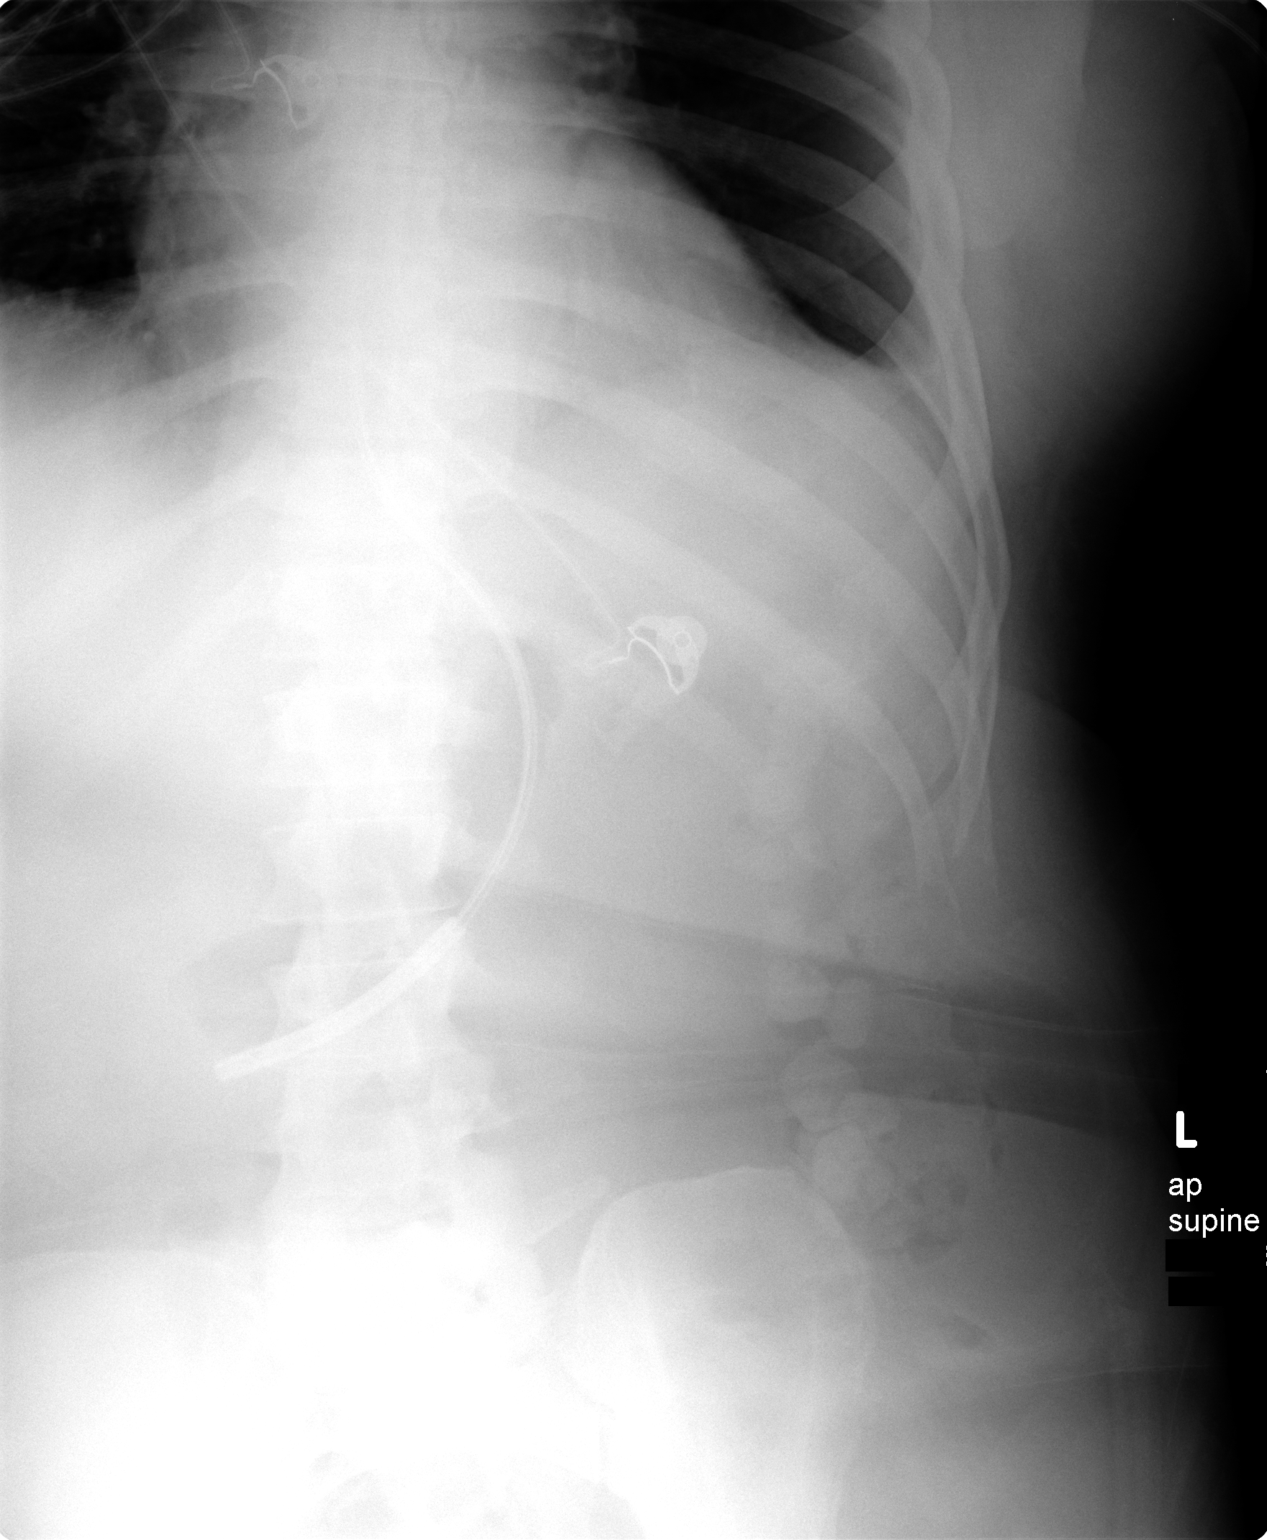

[1 of 1 positions shown; findings below may reference images not displayed]

PORTABLE ABDOMEN - 1 VIEW:

Supine film shows no evidence for bowel dilation to suggest obstruction.  No
free air is apparent although supine films can be insensitive for detection. 
Visualized bones are unremarkable

The tip of the feeding catheter is in the antral pyloric region of the stomach,
directed towards the duodenal bulb.
IMPRESSION: Feeding tube tip at the pylorus directed towards the duodenum.

## 2006-11-21 IMAGING — CR DG CHEST 1V PORT
1 series · 1 of 1 positions shown · non-contrast
Comparison: none

CLINICAL DATA: PICC line placement.  Renal failure.
 PORTABLE CHEST, 02/27/05, [DATE] HOURS:
 Compared to a chest x-ray of 02/14/05, a left PICC line is now present with the tip in the SVC.  No pneumothorax is seen.  Minimal left basilar atelectasis or scarring remains.  Cardiomegaly is stable.  Tracheostomy and feeding tube remain.

[view not recorded]
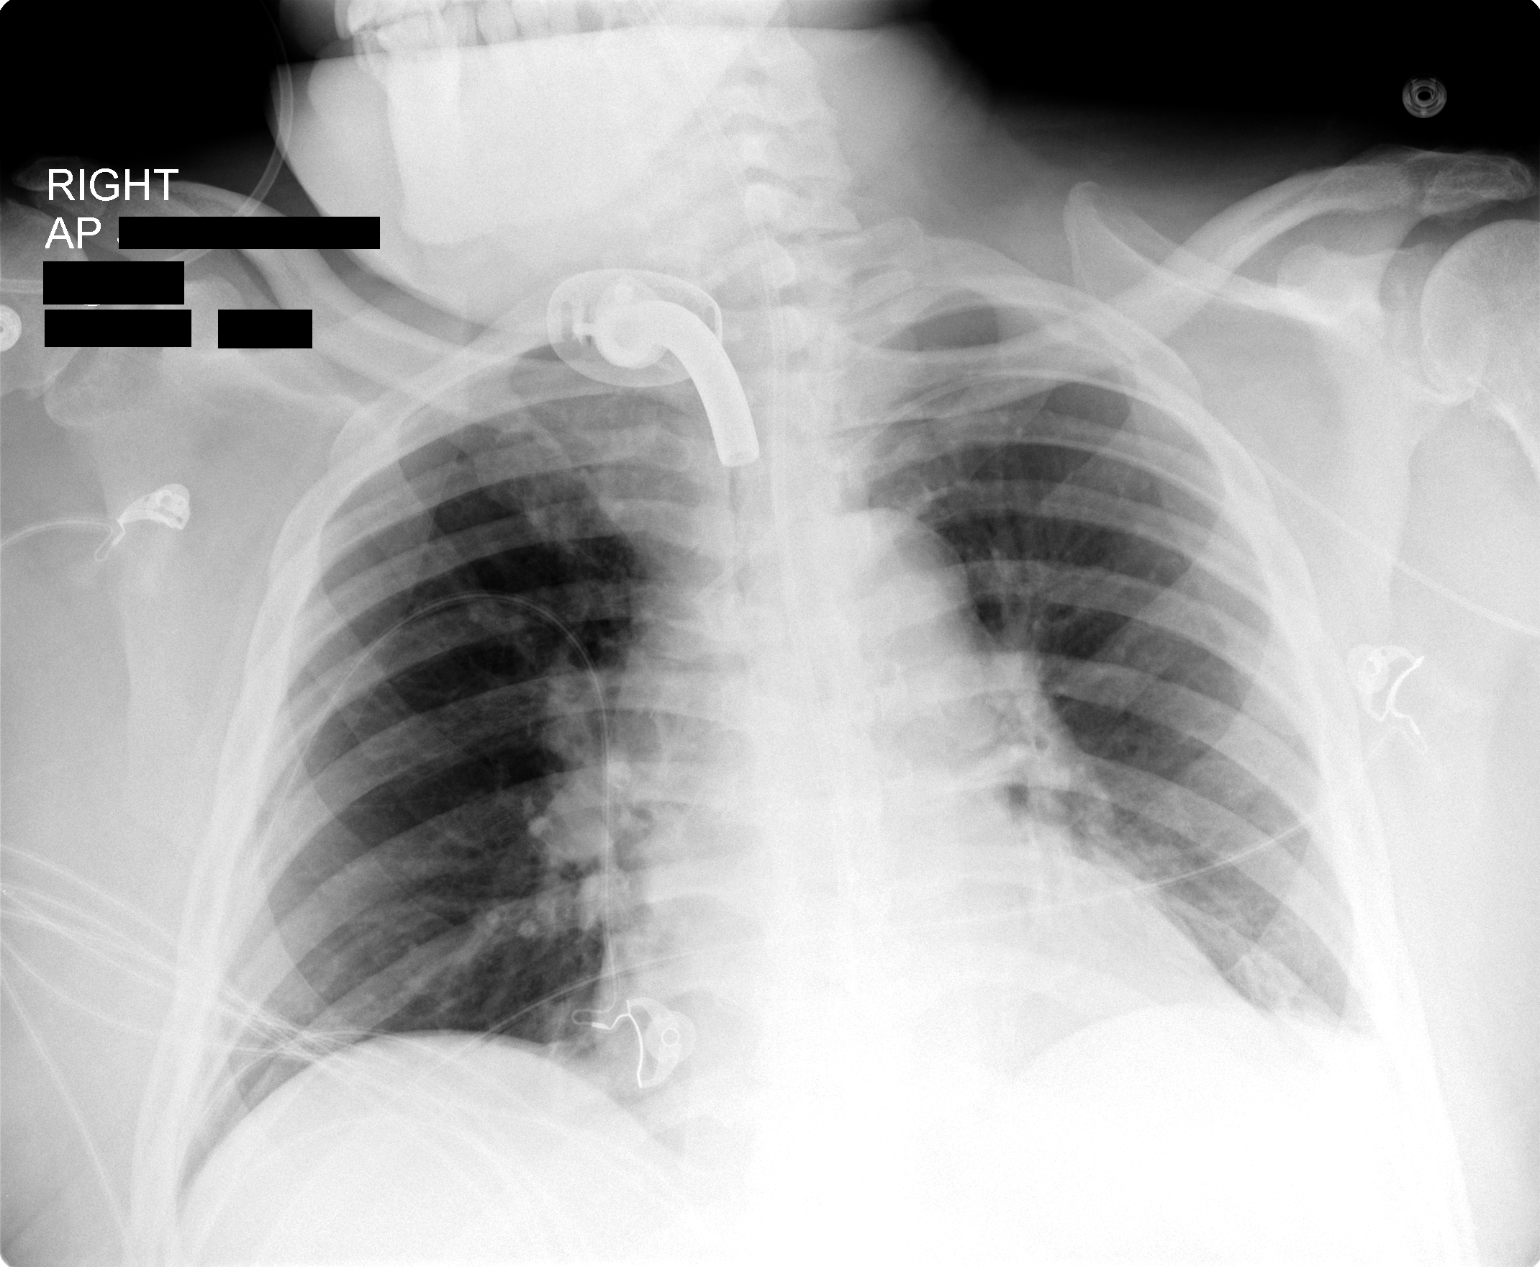

[1 of 1 positions shown; findings below may reference images not displayed]

IMPRESSION: Left PICC line tip in the SVC.  No pneumothorax.

## 2006-11-30 IMAGING — CR DG CHEST 1V PORT
1 series · 1 of 1 positions shown · non-contrast
Comparison: 02/27/05.

CLINICAL DATA: Acute renal failure, fever. 
 PORTABLE CHEST ? 1 VIEW:

[view not recorded]
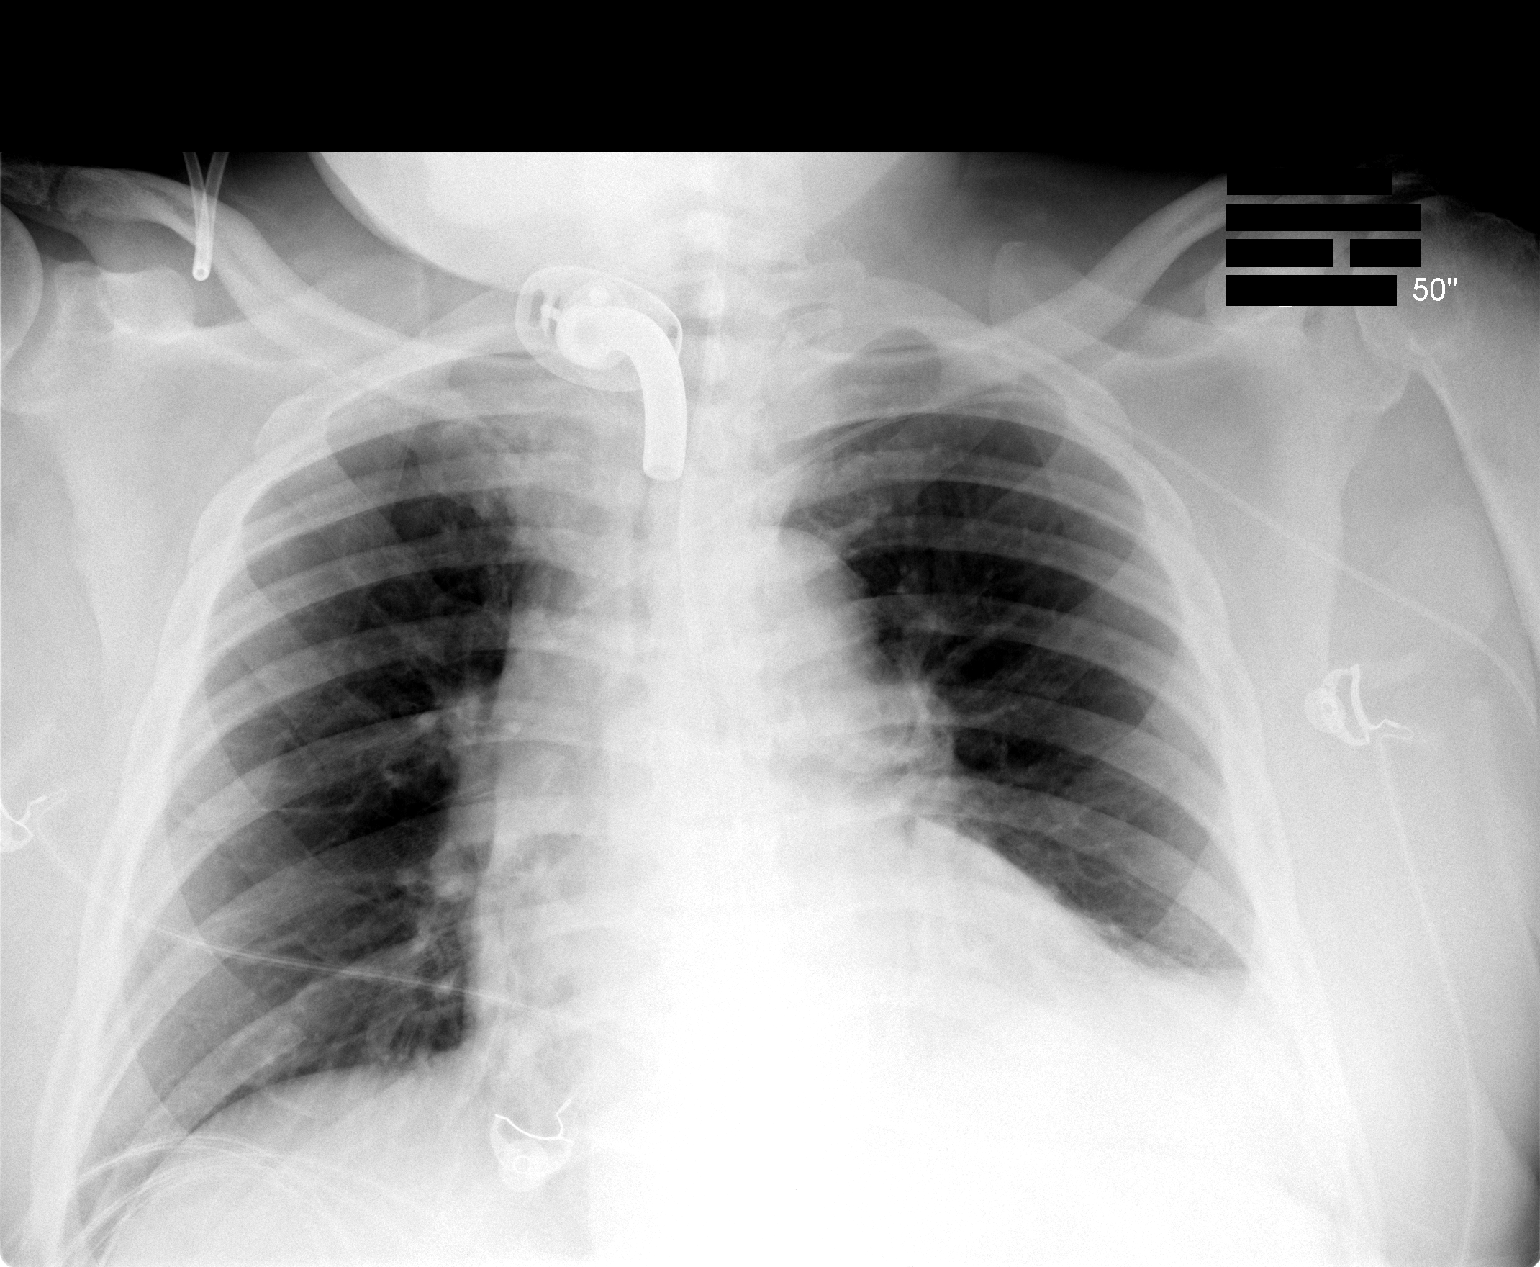

[1 of 1 positions shown; findings below may reference images not displayed]

Persistent density at the left base.   The left hemidiaphragm is less well-visualized, suggesting that this may be worsening, but this is a difficult assessment on a portable chest x-ray.   The right lung base is clear.   No frank congestive heart failure.   Tracheostomy and PICC unchanged.
IMPRESSION: Persistent and possibly increased density at the left base.

## 2006-11-30 IMAGING — CR DG CHEST 1V PORT
1 series · 1 of 1 positions shown · non-contrast
Comparison: Earlier the same day.

CLINICAL DATA: Acute renal failure.   CHF, postoperative chest. 
 PORTABLE CHEST - PYAQP (5873 HOURS):

[view not recorded]
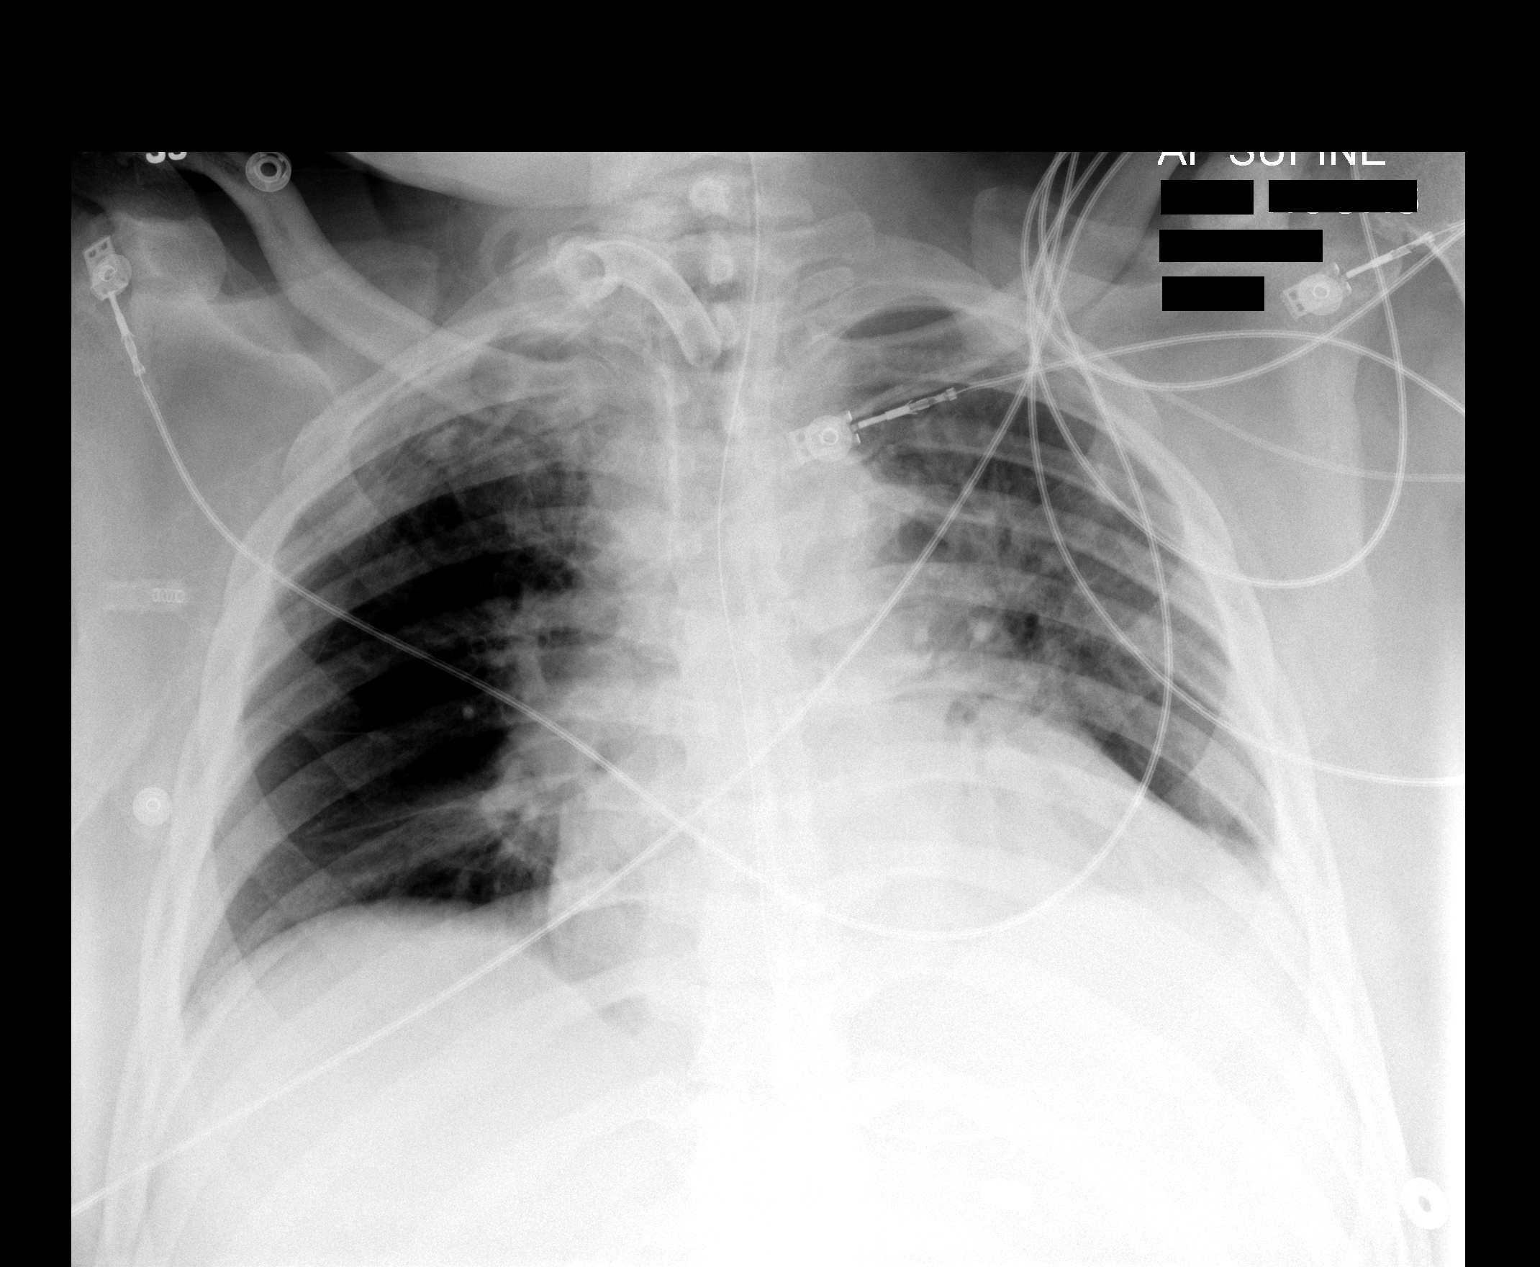

[1 of 1 positions shown; findings below may reference images not displayed]

Tracheostomy has been changed and appears satisfactory. Nasogastric tube enters the stomach. The patient has more atelectasis in the left lung than was seen previously.  It is possible there could be aspiration. The right lung is largely clear.
IMPRESSION: 1.  Tracheostomy grossly well positioned.  
 2.  More pulmonary density on the left that could be due to atelectasis or pneumonia.

## 2006-12-01 IMAGING — CR DG CHEST 1V PORT
1 series · 1 of 1 positions shown · non-contrast
Comparison: 03/08/2005.

CLINICAL DATA: Acute renal failure. CHF.

[view not recorded]
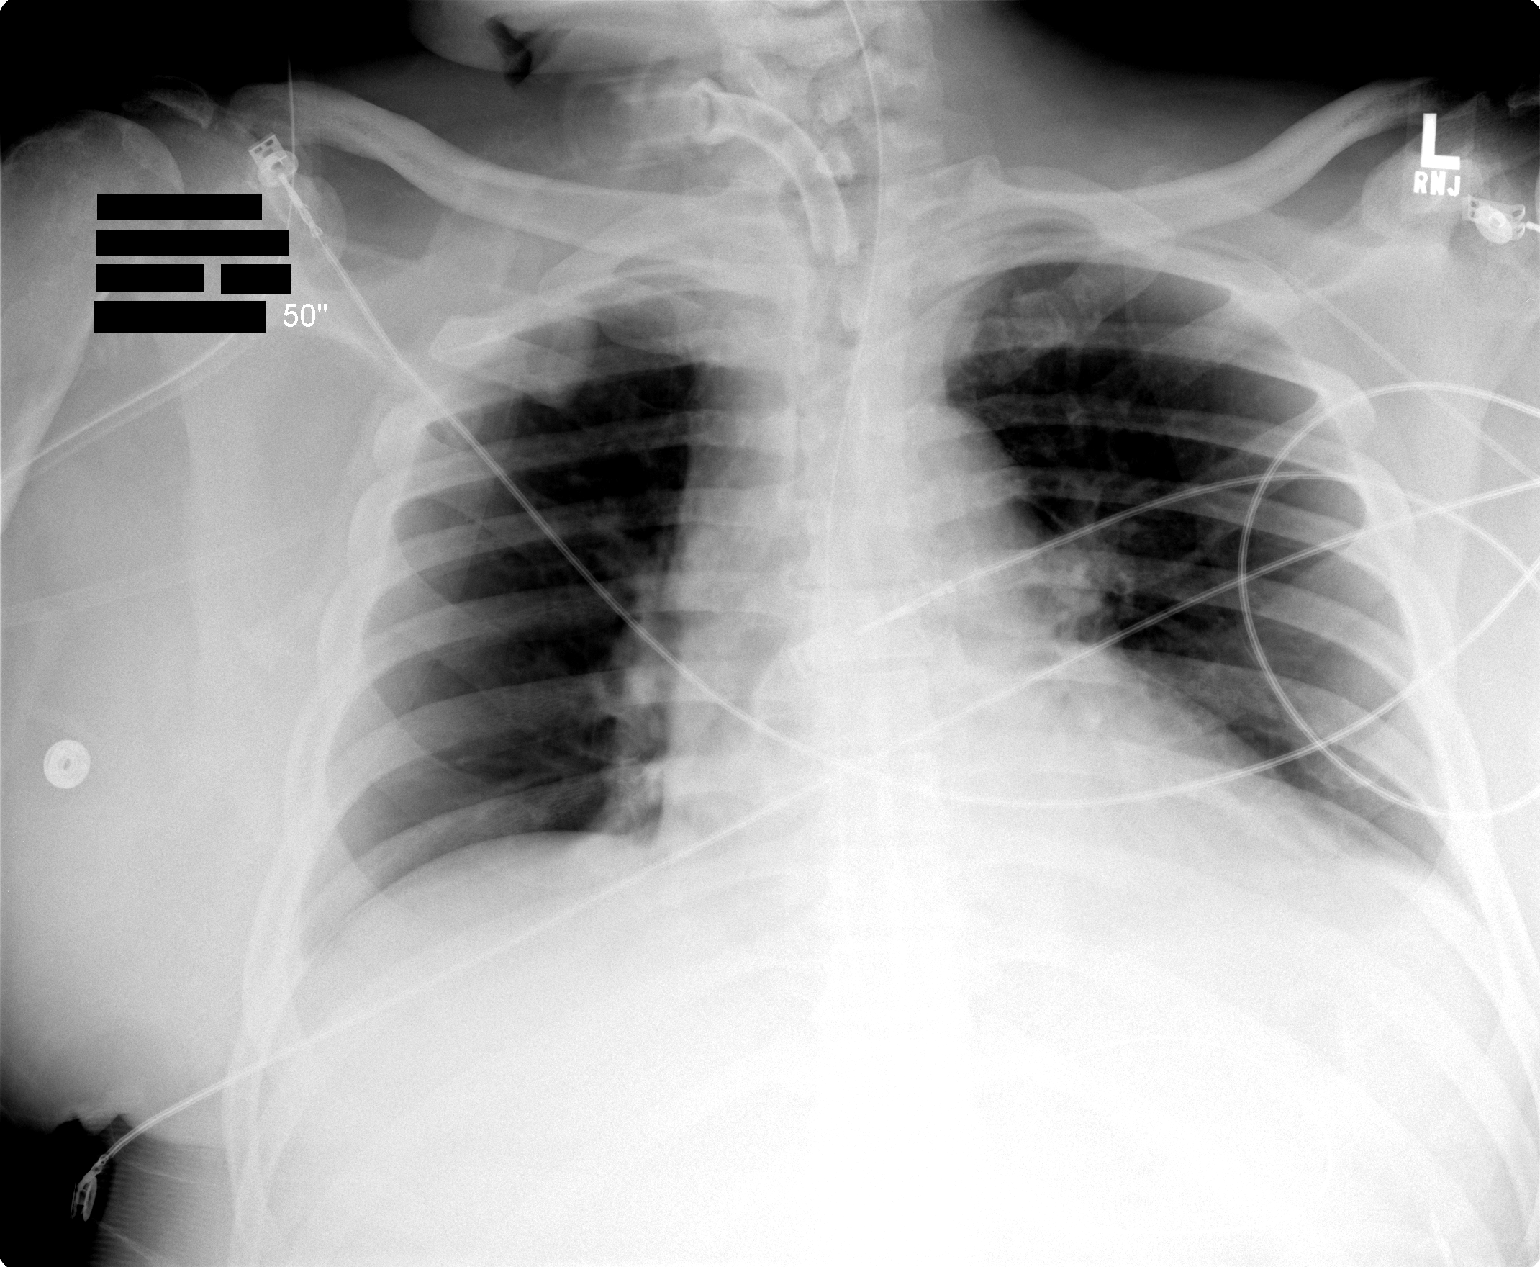

[1 of 1 positions shown; findings below may reference images not displayed]

PORTABLE CHEST - 1 VIEW:

AP film at 7737 hours. Stable exam. No change in the left base atelectasis or
infiltrate. Tracheostomy tube and NG tube remain as does the left PICC line.
IMPRESSION: No interval change.

## 2006-12-02 IMAGING — CR DG CHEST 1V PORT
1 series · 1 of 1 positions shown · non-contrast
Comparison: 03/09/2005

CLINICAL DATA: Acute renal fair. CHF.

[view not recorded]
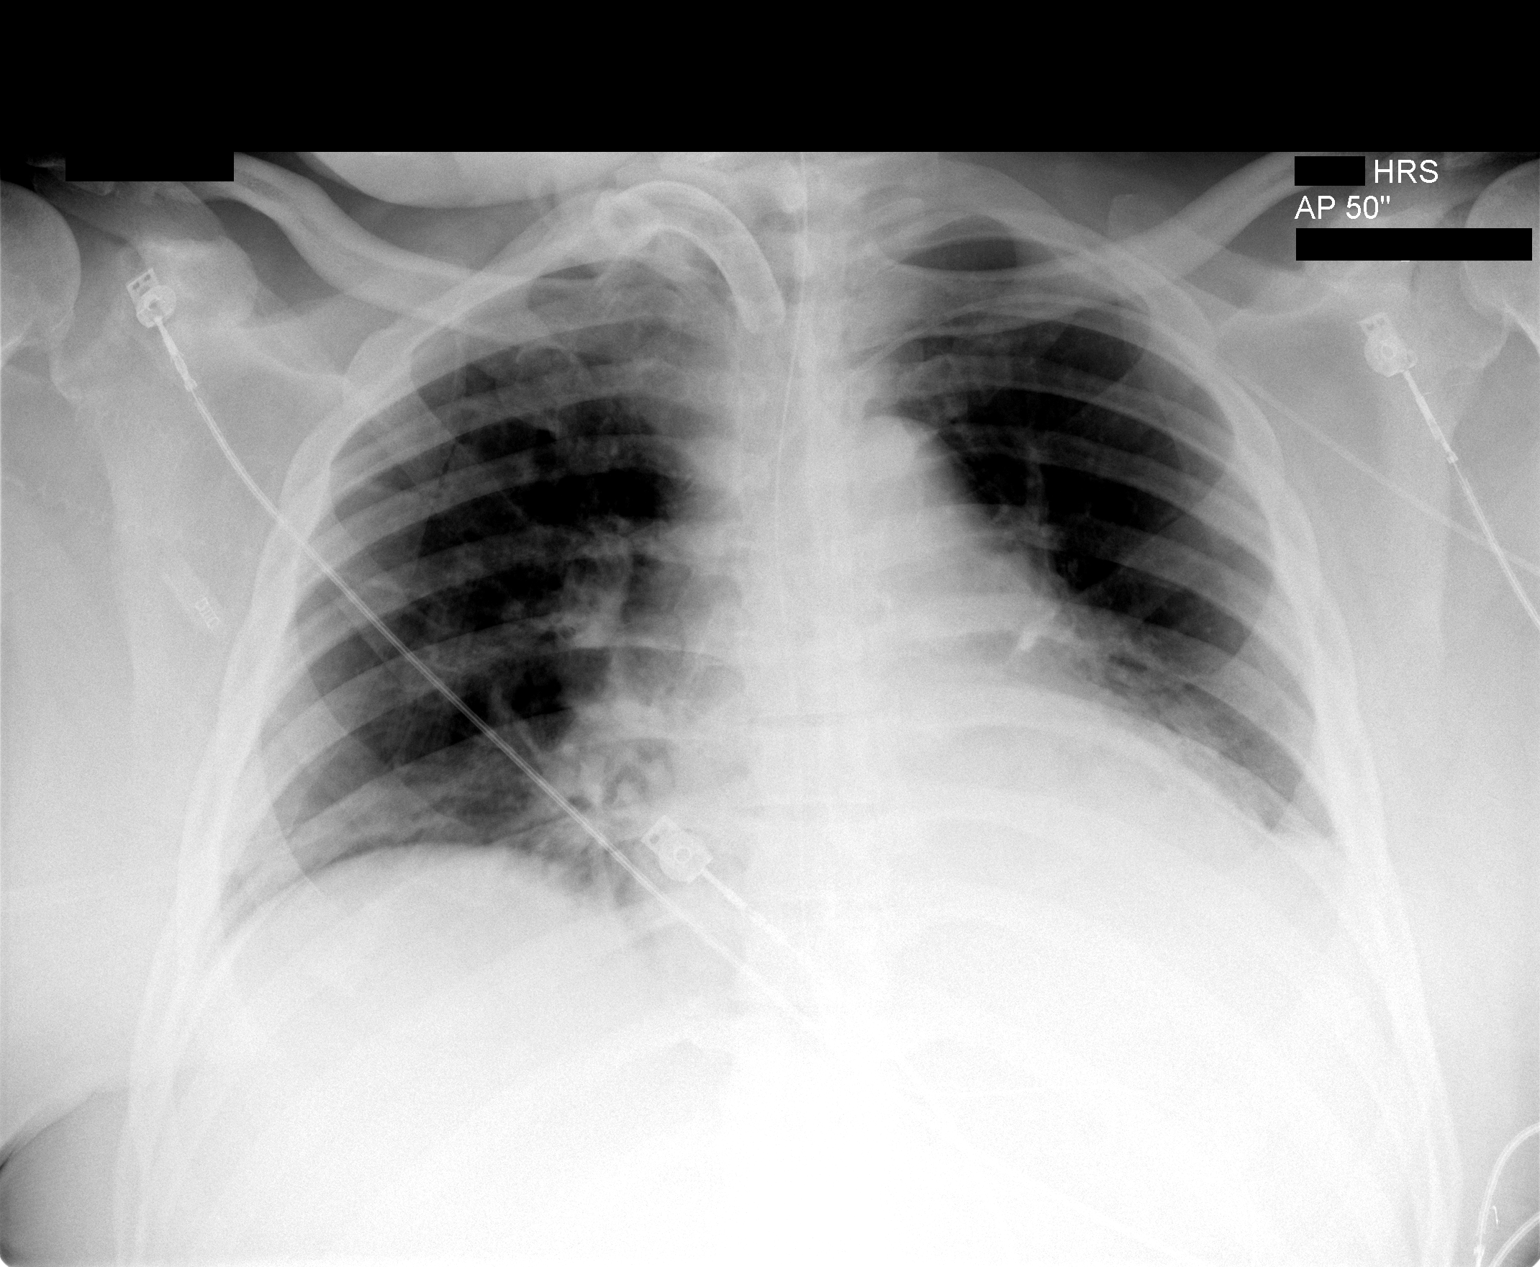

[1 of 1 positions shown; findings below may reference images not displayed]

PORTABLE CHEST - 1 VIEW:

AP film at 4334. The cardiopericardial silhouette is enlarged but stable.
Vascular congestion has progressed in the interval. The left base atelectasis or
infiltrate has also progressed. Tracheostomy tube, NG tube, and left PICC line
remain in place.
IMPRESSION: Worsening vascular congestion.

Increasing left base atelectasis or infiltrate.

## 2006-12-03 IMAGING — CR DG CHEST 1V PORT
1 series · 1 of 1 positions shown · non-contrast
Comparison: 03/10/05.

CLINICAL DATA: Infiltrates. 
 PORTABLE CHEST, 03/11/05:

[view not recorded]
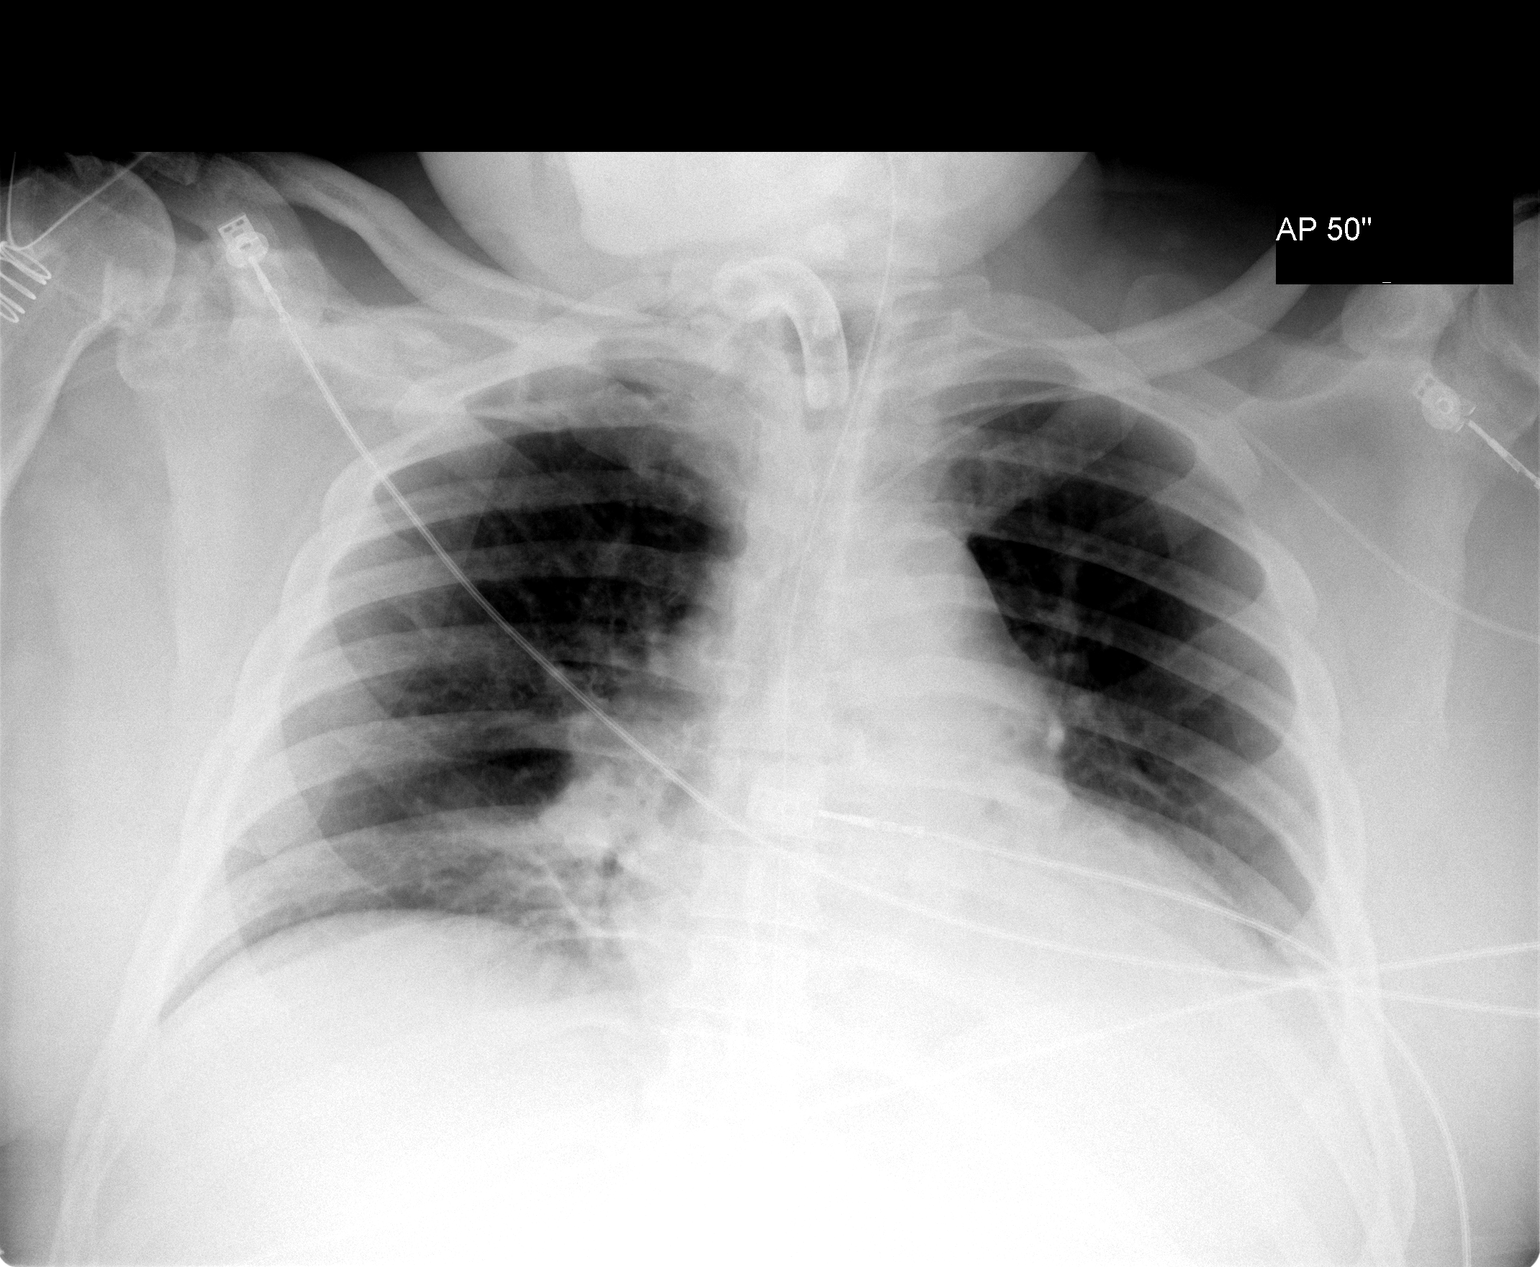

[1 of 1 positions shown; findings below may reference images not displayed]

Tracheostomy, central catheter and NG tube remain in satisfactory position.  Persistent left lower lobe atelectasis.  Mild cardiac enlargement.
IMPRESSION: Negative for interval change.

## 2006-12-04 IMAGING — CR DG CHEST 1V PORT
1 series · 1 of 1 positions shown · non-contrast
Comparison: 03/11/05.

CLINICAL DATA: Acute renal failure.  Congestive heart failure.  
 PORTABLE CHEST - 1 VIEW 03/12/05 AT 7157 HOURS:

[view not recorded]
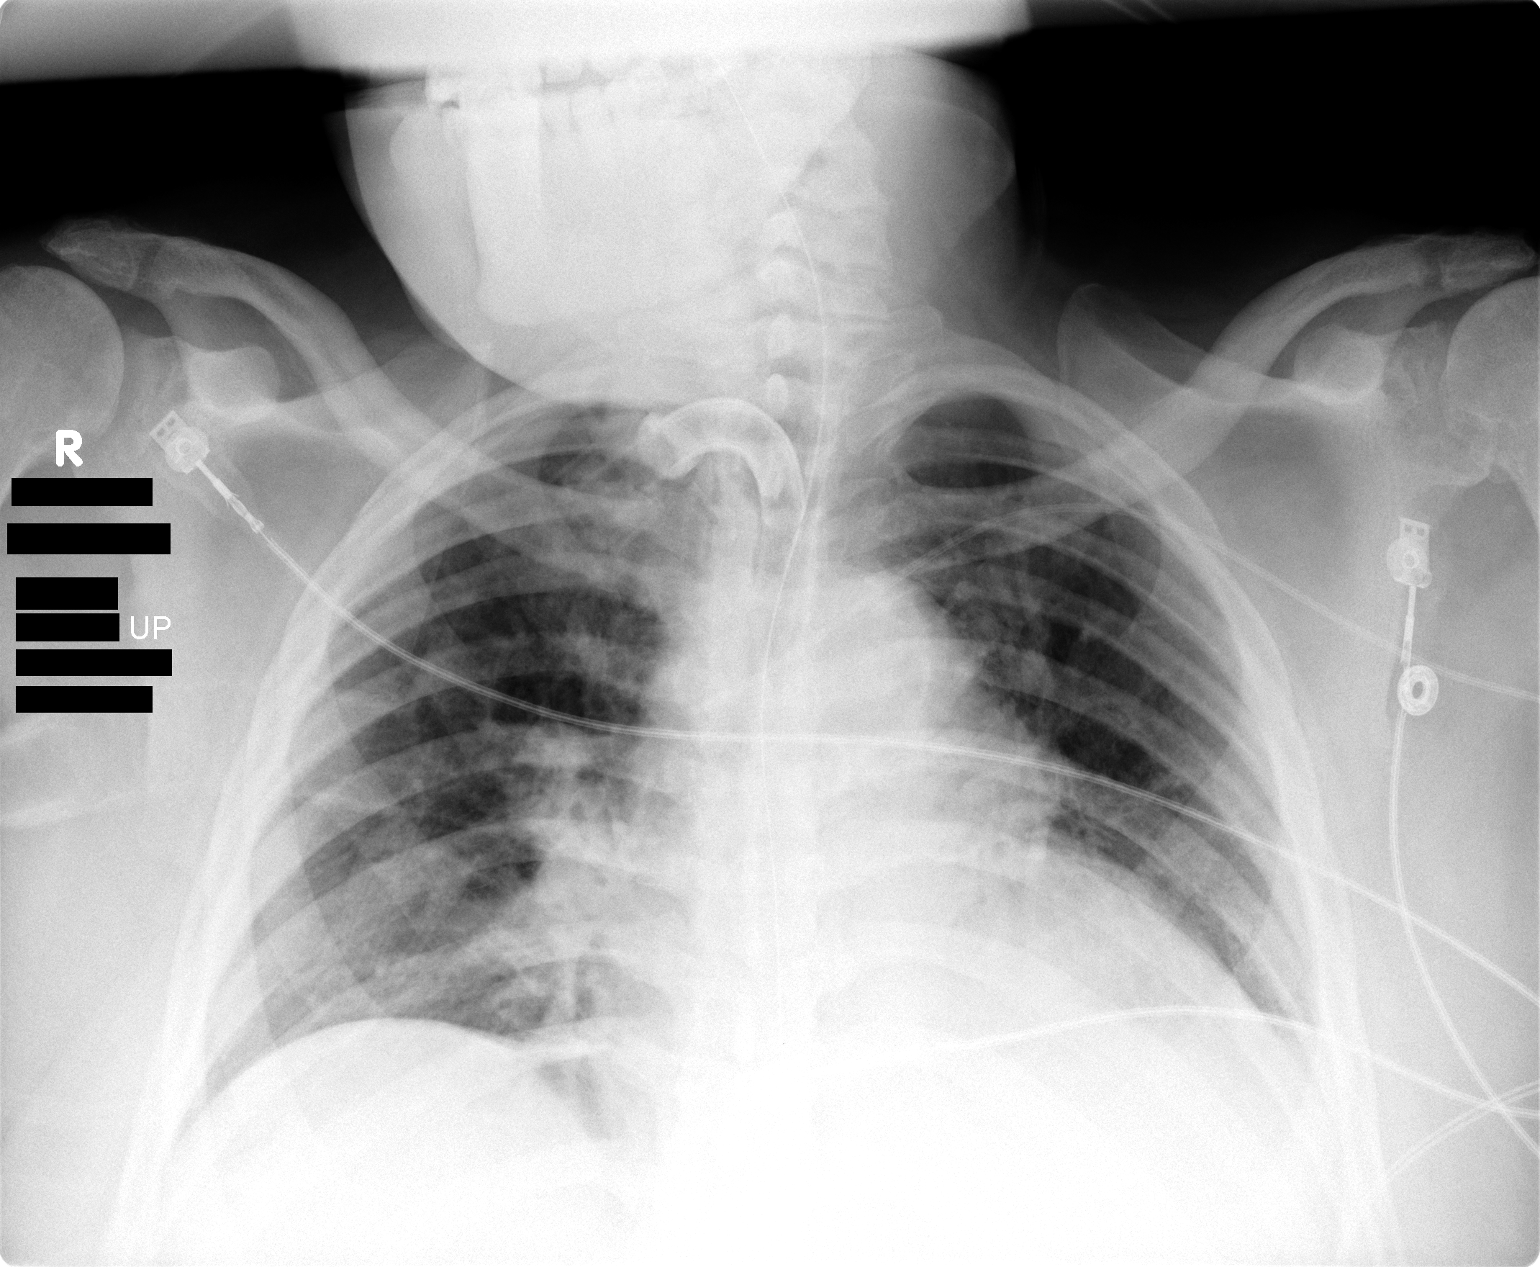

[1 of 1 positions shown; findings below may reference images not displayed]

FINDINGS: Low lung volumes are again noted with bibasilar atelectasis or infiltrates, without significant change.  Cardiomegaly is stable.  Tracheostomy tube and nasogastric tube remain in place.
IMPRESSION: No significant interval change when compared with prior study.

## 2006-12-04 IMAGING — CR DG ABD PORTABLE 1V
2 series · 2 of 2 positions shown · non-contrast
Comparison: none

CLINICAL DATA: Feeding tube placement. 

Portable abdomen at 6658:
Comparison 02/25/2005. Feeding tube  has been placed into the gastric fundus.
Surgical drains project in the   lower abdomen. A few gas-distended small bowel
loops project in the lower midabdomen. Colon is decompressed.

[view not recorded (1 of 2)]
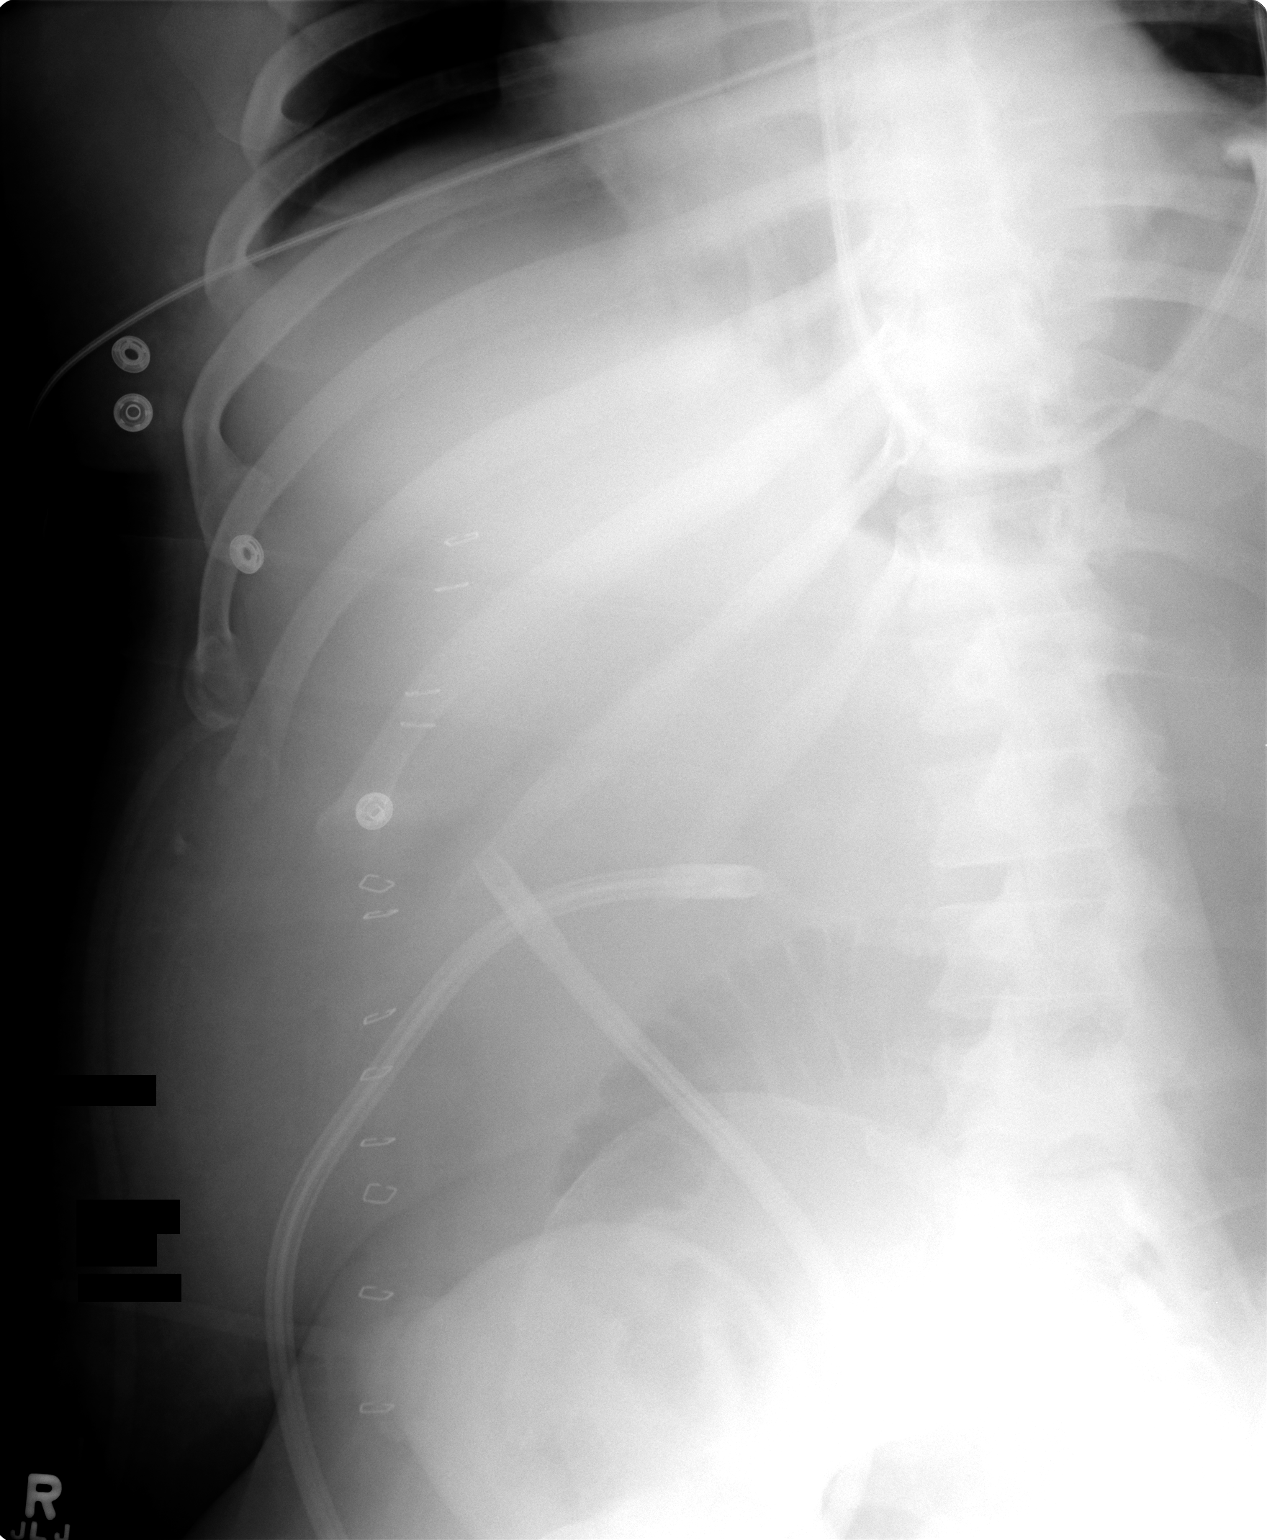

[view not recorded (2 of 2)]
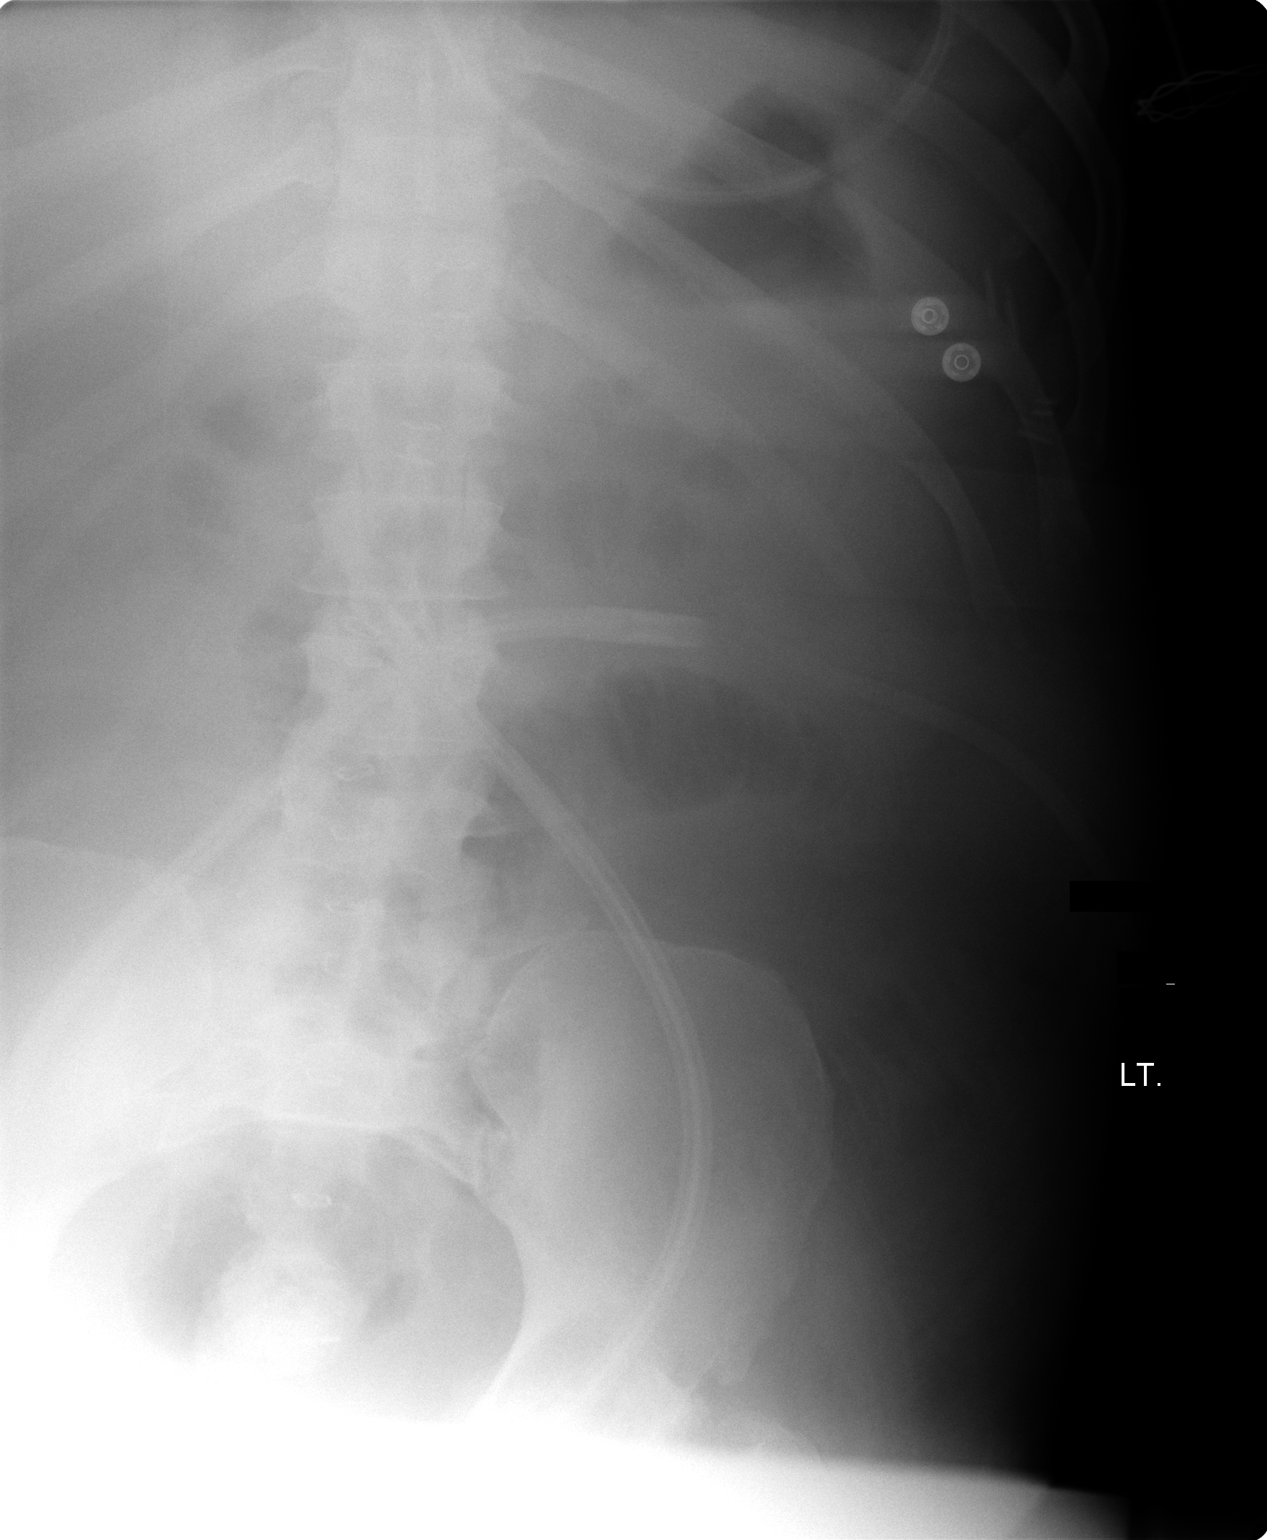

[2 of 2 positions shown; findings below may reference images not displayed]

IMPRESSION: 1. Feeding tube to the gastric fundus.
2. Postoperative changes as above

## 2006-12-05 IMAGING — CR DG ABD PORTABLE 1V
1 series · 1 of 1 positions shown · non-contrast
Comparison: none

CLINICAL DATA: Acute renal failure.  Feeding tube placement.
 PORTABLE ABDOMEN ? 1 VIEW ? 03/13/05 AT 6661 HOURS:
 Compared to the prior study today at 4654 hours, the feeding tube remains with the tip in the region of the pylorus and duodenal bulb.  
 Midline skin staples are seen.  Several gas-filled nondilated small bowel loops are noted.  A surgical drain is seen overlying the lower abdomen and pelvis.

[view not recorded]
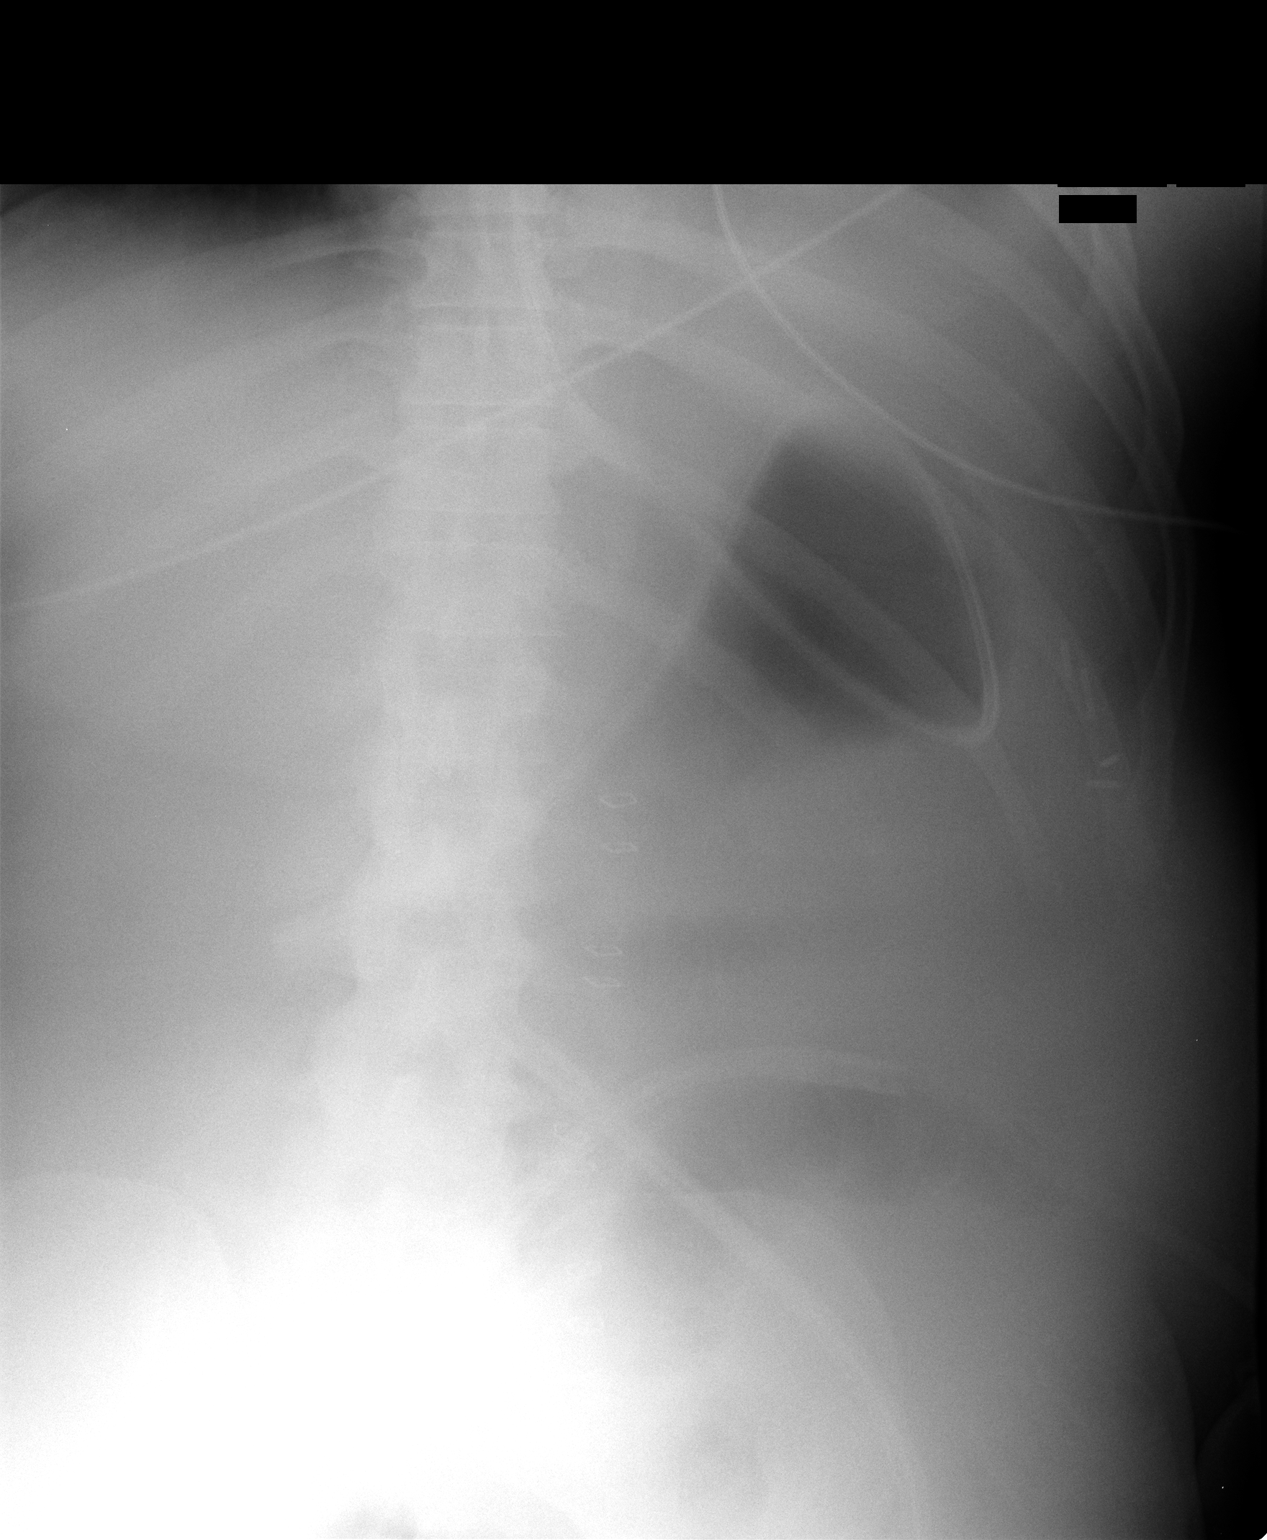

[1 of 1 positions shown; findings below may reference images not displayed]

IMPRESSION: Panda feeding tube tip at pylorus or duodenal bulb.

## 2006-12-05 IMAGING — CR DG ABD PORTABLE 1V
2 series · 2 of 2 positions shown · non-contrast
Comparison: None

CLINICAL DATA: Feeding tube placement

PORTABLE ABDOMEN - 1 VIEW

[view not recorded (1 of 2)]
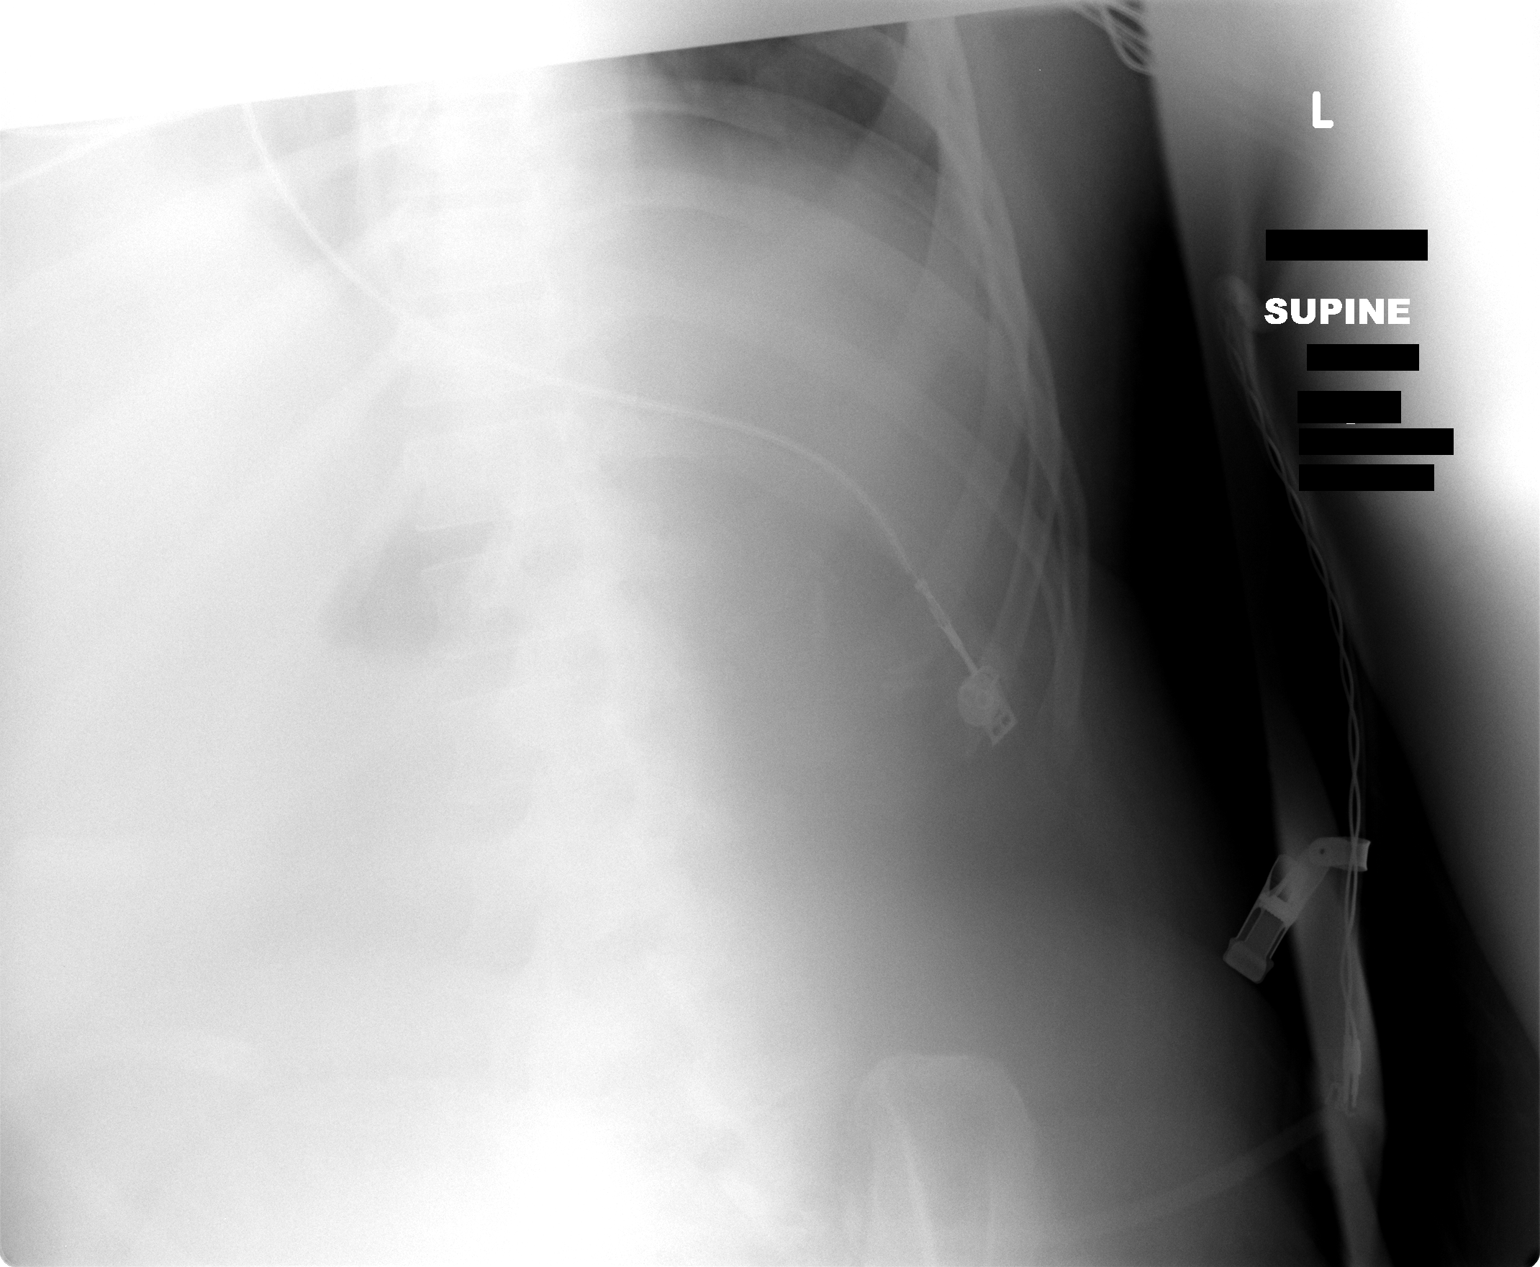

[view not recorded (2 of 2)]
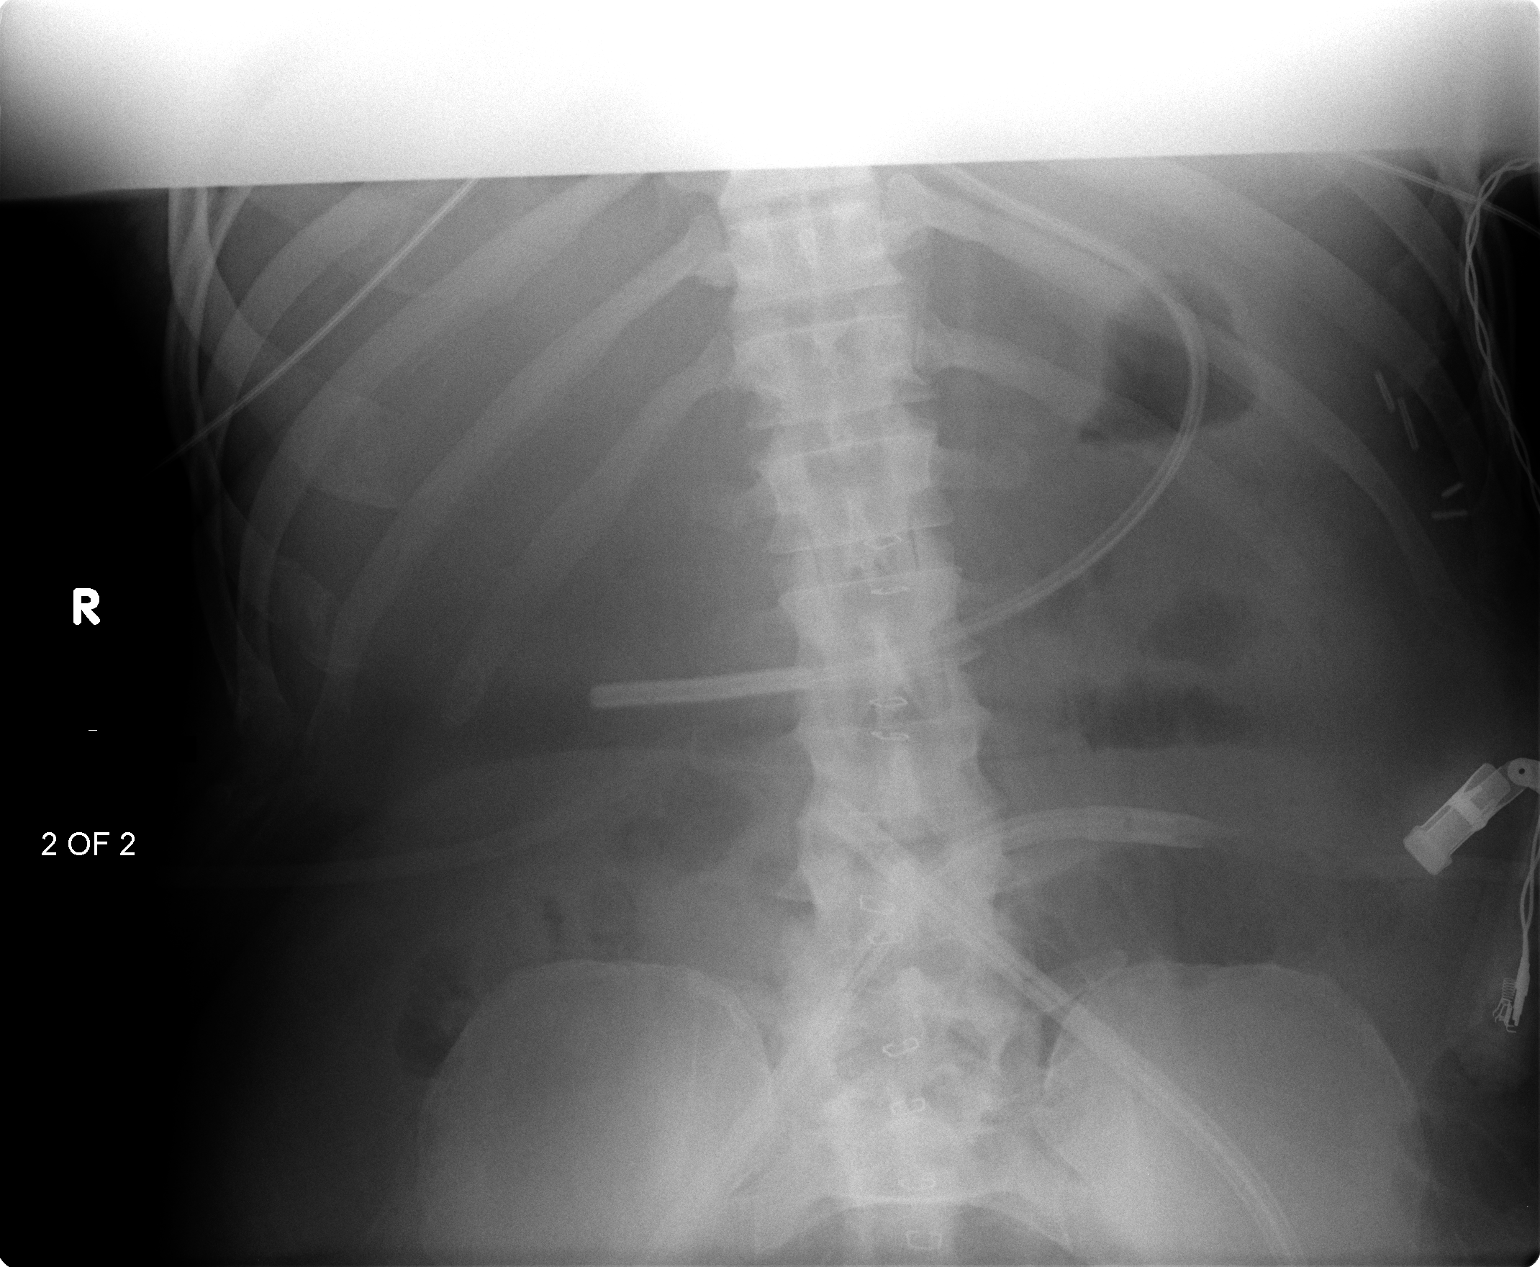

[2 of 2 positions shown; findings below may reference images not displayed]

FINDINGS: Feeding tube is in place with the tip in the peripyloric region. No
evidence of obstruction.

IMPRESSION

Feeding tube tip peripyloric region.

## 2006-12-05 IMAGING — RF DG ABD PORTABLE 1V
1 series · 3 of 3 positions shown · non-contrast
Comparison: none

CLINICAL DATA: CHF

Feeding tube placement under fluoroscopy:
Under C-arm fluoroscopy, a panda type feeding tube was advanced into the
duodenum. Contrast injection confirmed appropriate post pyloric position.
Redundant tubing was left in the stomach. Tubing was secured externally.

[Series 1: run · 3 of 3 slices shown]
[im 1/3]
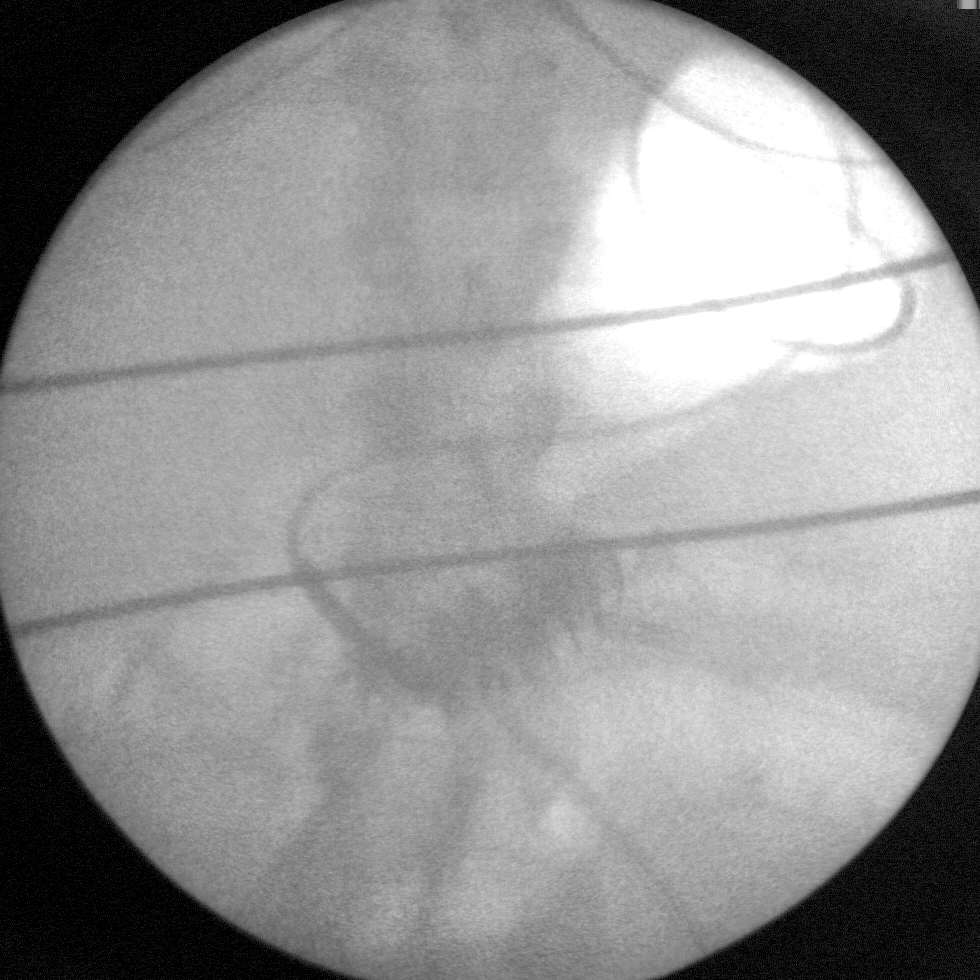
[im 2/3]
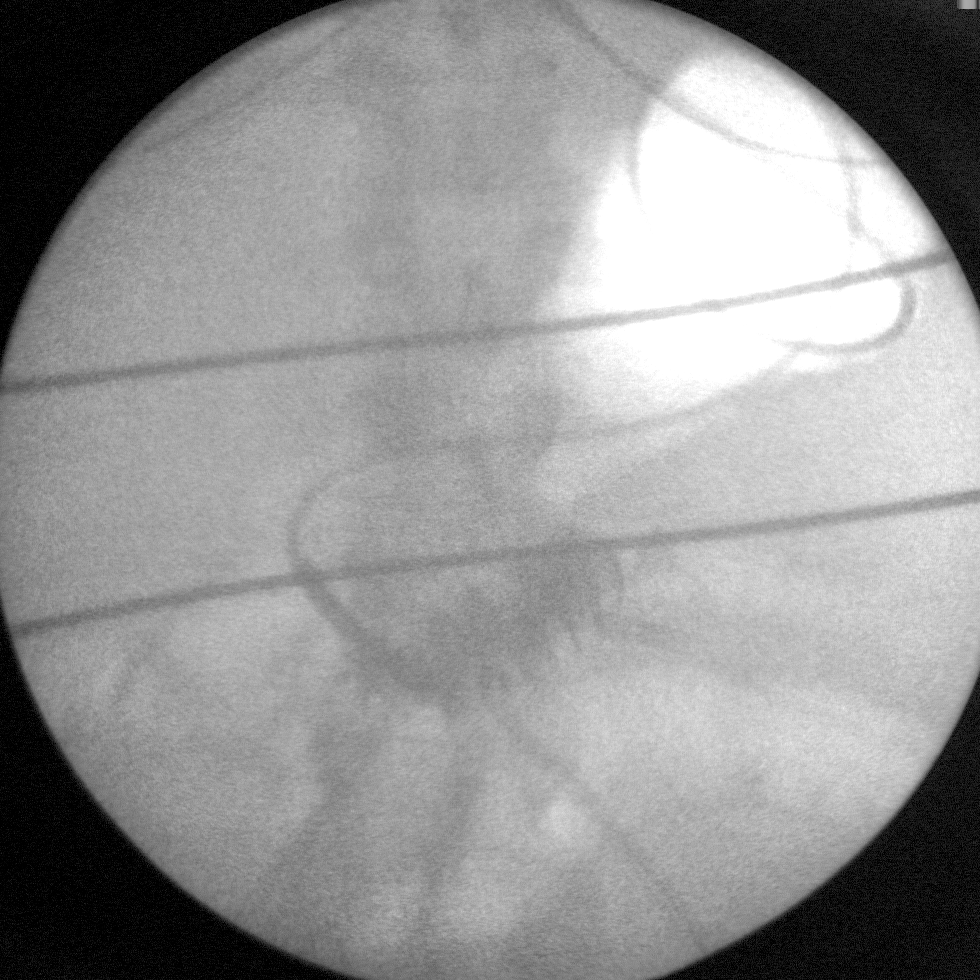
[im 3/3]
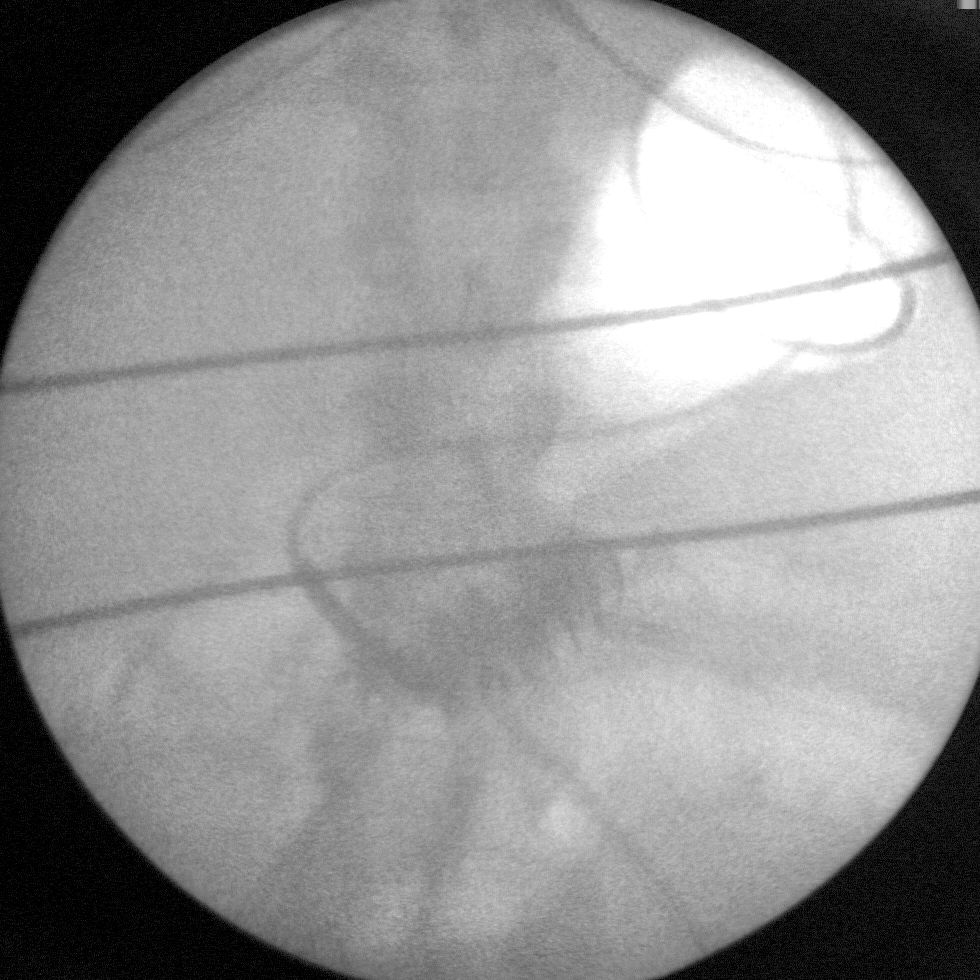

[3 of 3 positions shown; findings below may reference images not displayed]

IMPRESSION: 1. Feeding tube placement into the duodenum, ready for post pyloric use

## 2006-12-06 IMAGING — CR DG CHEST 1V PORT
1 series · 1 of 1 positions shown · non-contrast
Comparison: 03/12/05.

CLINICAL DATA: Pulmonary vascular congestion.  Congestive heart failure. 
 PORTABLE CHEST ? 1 VIEW:

[view not recorded]
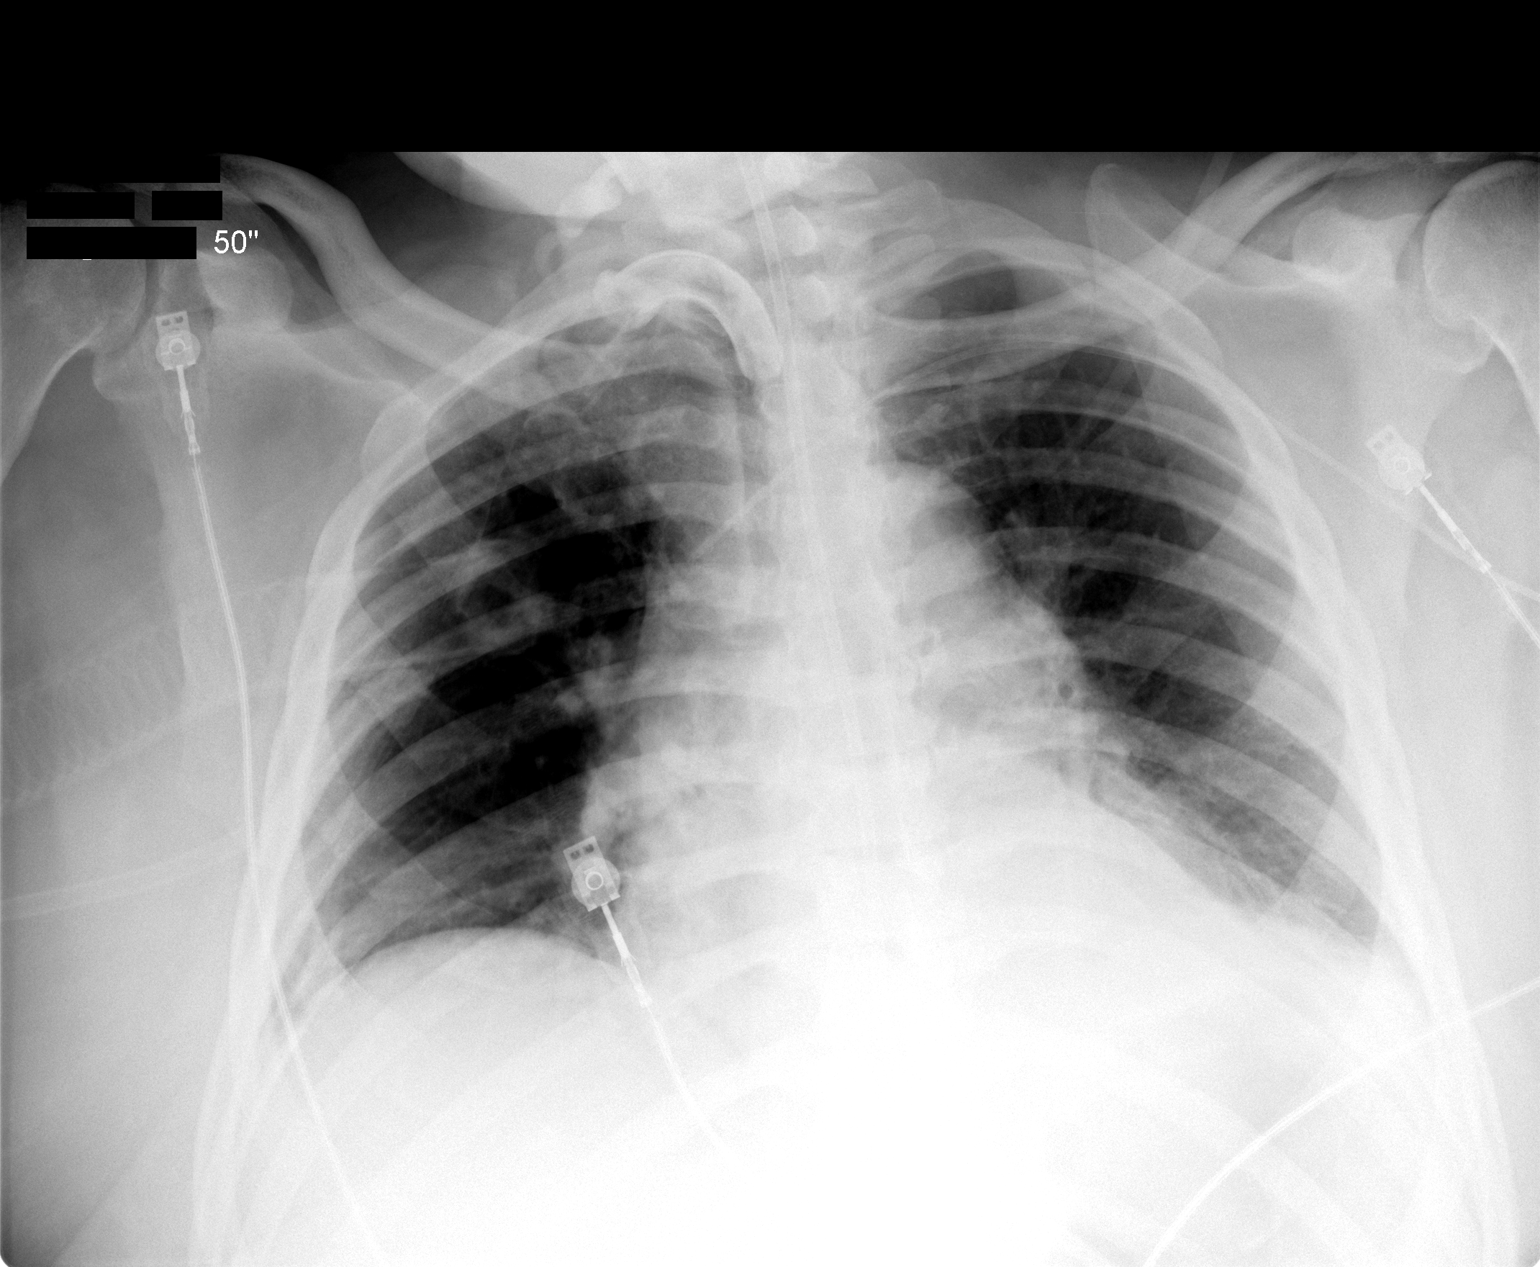

[1 of 1 positions shown; findings below may reference images not displayed]

Tracheostomy tube, left-sided PICC line, and new feeding tube are in place.   Tip of the feeding tube is not visible, but is below the diaphragm.  There is further atelectasis or consolidation at the left base medially.  The aeration of the right base has improved.  Vascularity is normal.
IMPRESSION: Increasing atelectasis/infiltrate at the left base.

## 2006-12-07 IMAGING — CR DG ABD PORTABLE 1V
1 series · 1 of 1 positions shown · non-contrast
Comparison: none

CLINICAL DATA: Feeding tube placement. 
 PORTABLE ABDOMEN ? 1 VIEW:
 The feeding tube tip appears to be in the region of the pylorus or duodenal bulb.

[view not recorded]
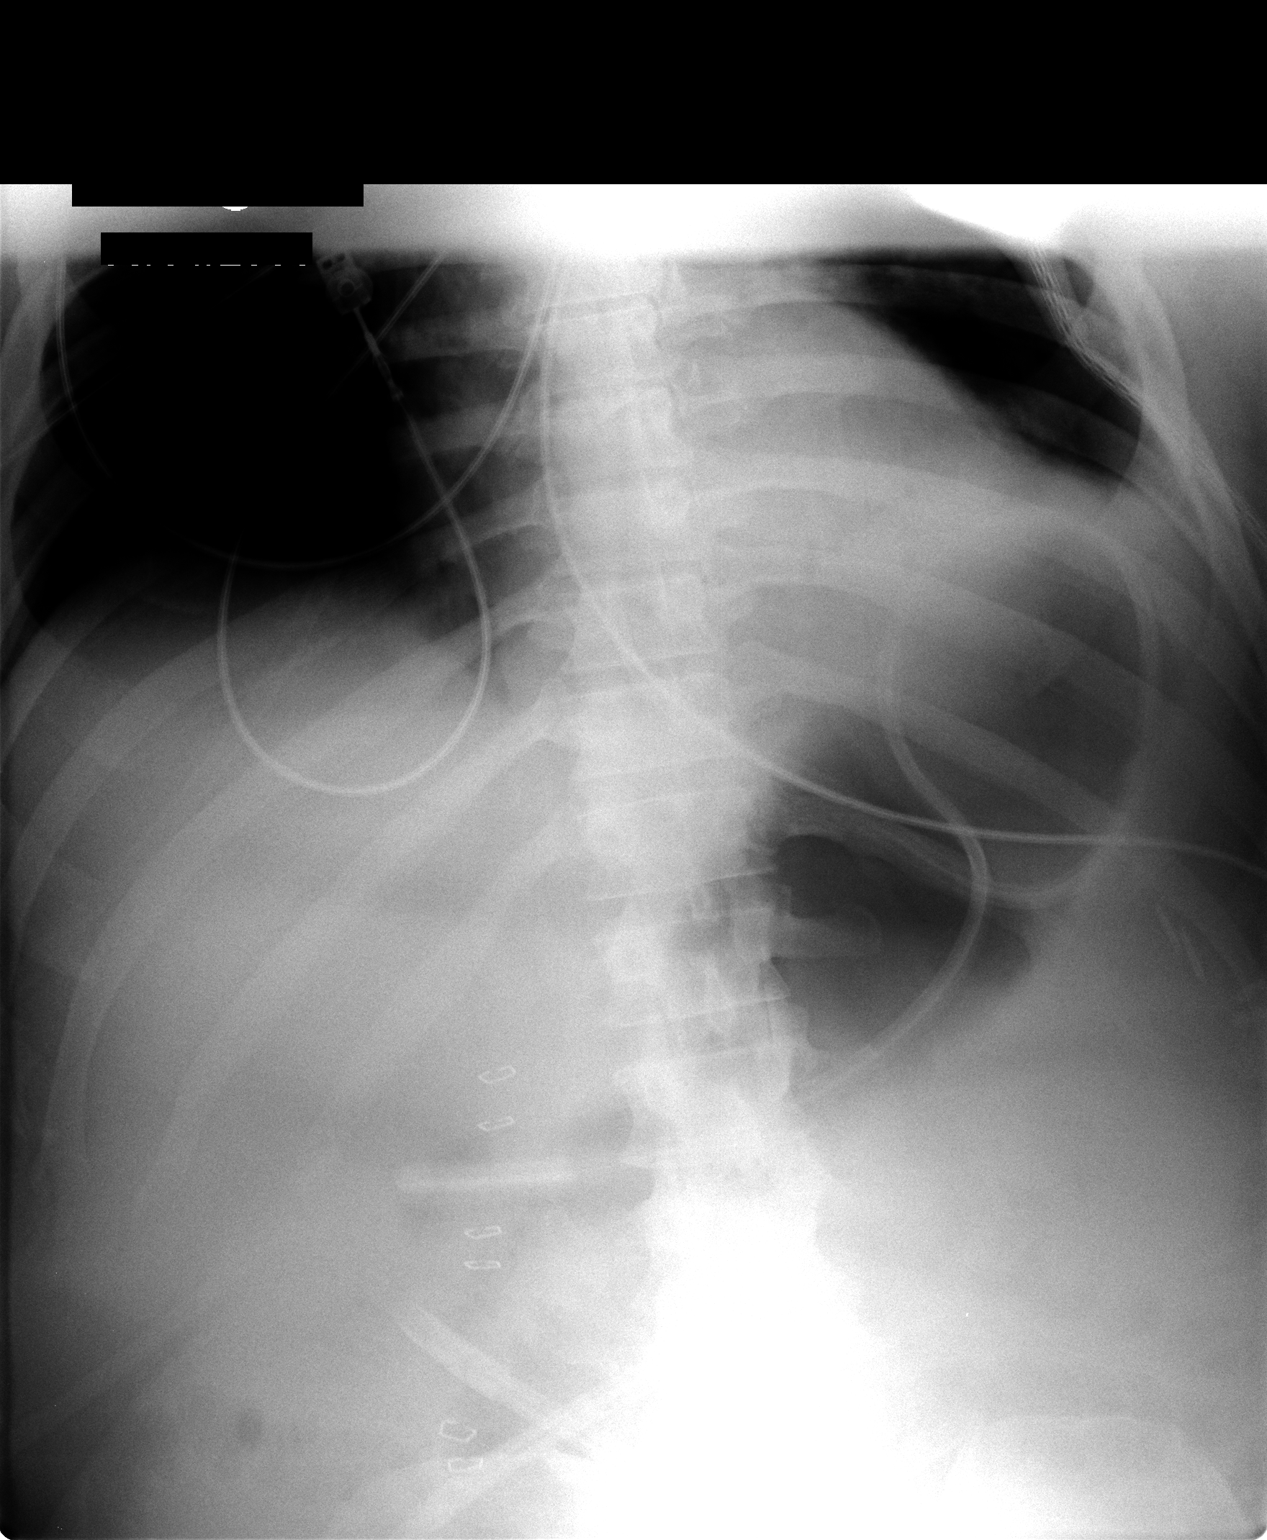

[1 of 1 positions shown; findings below may reference images not displayed]

IMPRESSION: Feeding tube tip probably in the duodenal bulb.

## 2006-12-08 IMAGING — CR DG CHEST 1V PORT
1 series · 1 of 1 positions shown · non-contrast
Comparison: 03/14/05.

CLINICAL DATA: Left base atelectasis.
 PORTABLE CHEST ? 1 VIEW:

[view not recorded]
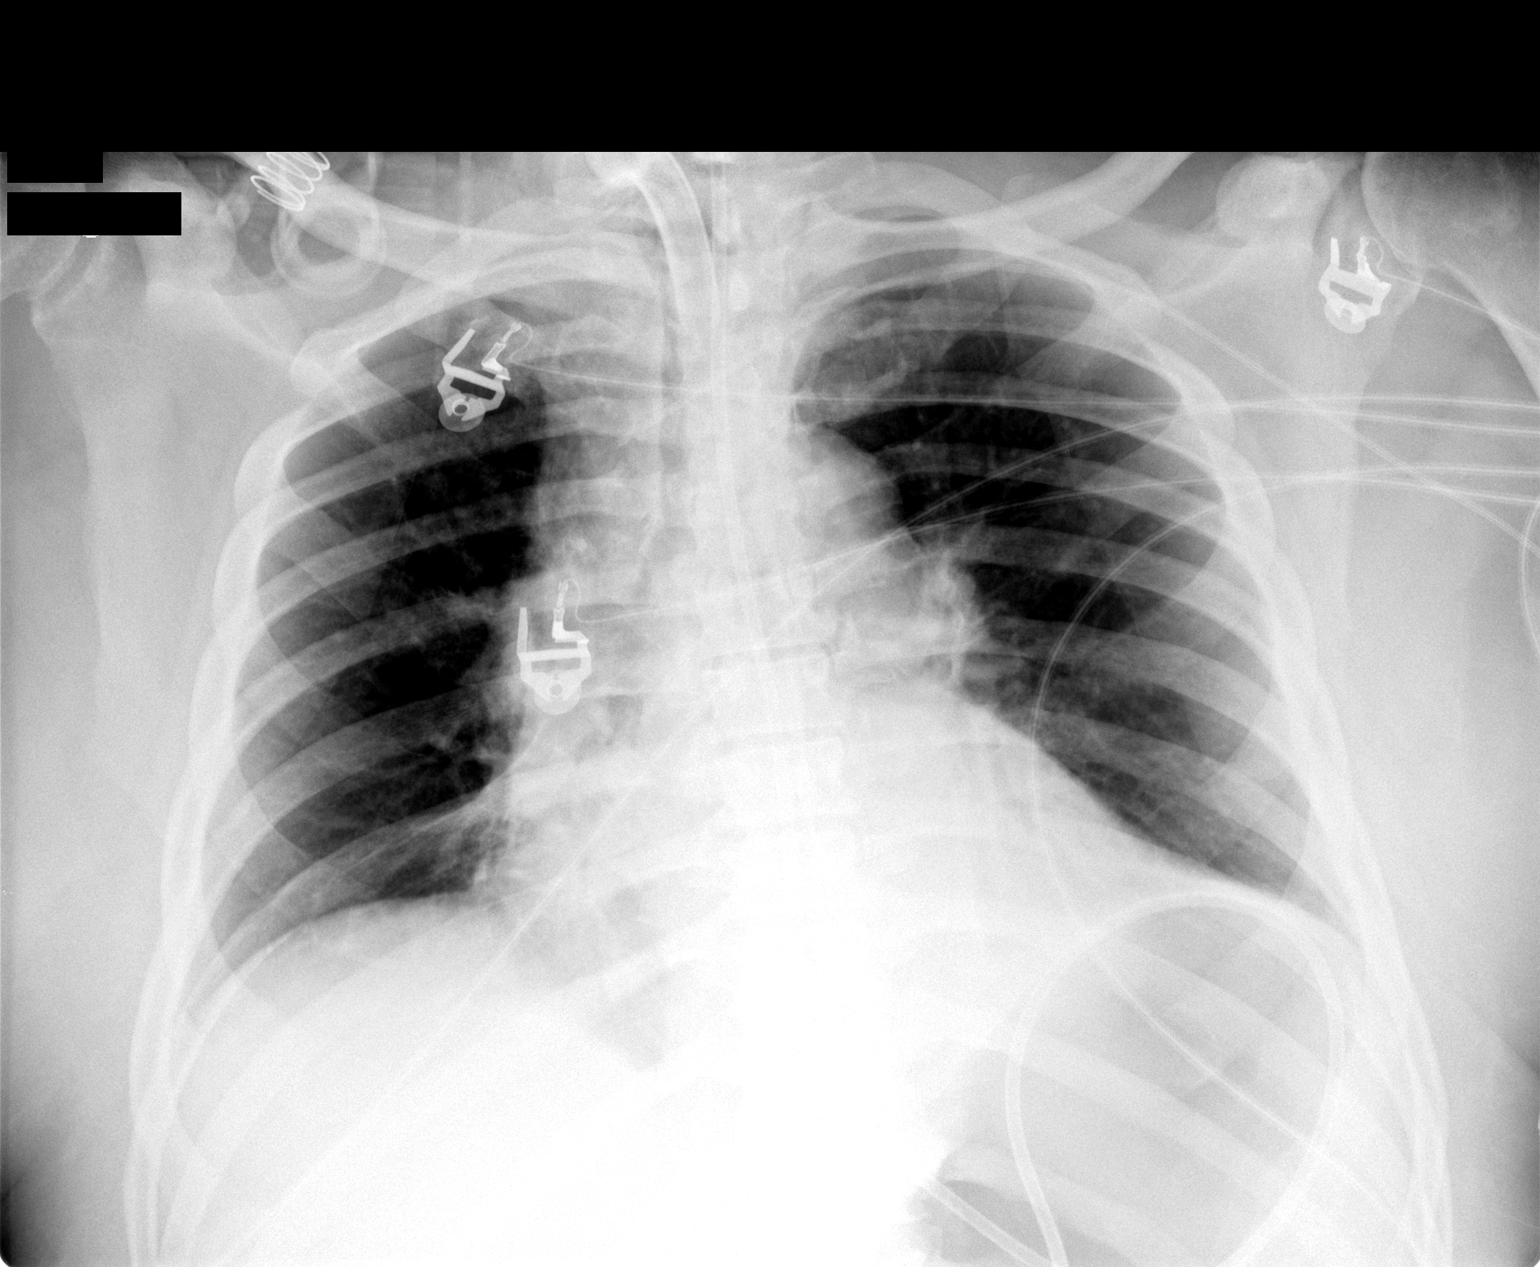

[1 of 1 positions shown; findings below may reference images not displayed]

There has been improvement in the atelectasis at both bases since the prior study.   However, some moderate atelectasis does remain medially at the left base.   Tracheostomy tube, feeding tube, and left-sided central venous catheter are in place.
IMPRESSION: Improving atelectasis.

## 2006-12-10 IMAGING — CT CT PELVIS W/ CM
1 of 5 series · 12 of 32 positions shown, 17 images · IV contrast (omnipaque)
Comparison: 02/16/05 and prior abdominal radiograph of 03/15/05.

CLINICAL DATA: Acute renal failure.  Postop fever. 
ABDOMEN CT WITH CONTRAST:
TECHNIQUE: Multidetector CT imaging of the abdomen was performed following the standard protocol during bolus administration of intravenous contrast.
Contrast:  150 cc Omnipaque 300.  Oral contrast was administered via Panda tube.
TECHNIQUE: Multidetector CT imaging of the pelvis was performed following the standard protocol during bolus administration of intravenous contrast.

[Series 2: abd/pel 5.0 b30f · axial · 0.86mm/px · z∈[-476,-41]mm · 12 of 101 slices shown, 17 images]
[im 7/101  soft-tissue]
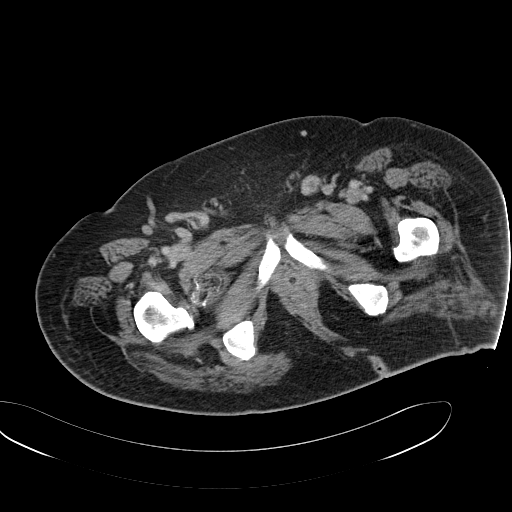
[im 7/101  bone]
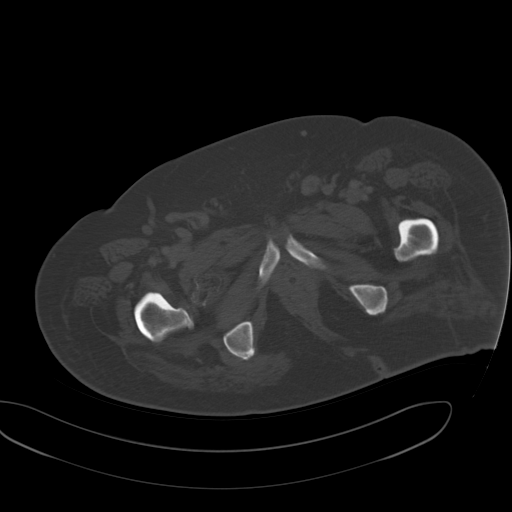
[im 14/101  soft-tissue]
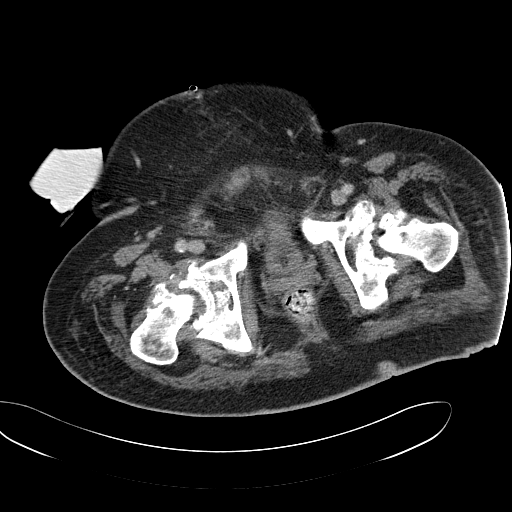
[im 27/101  soft-tissue]
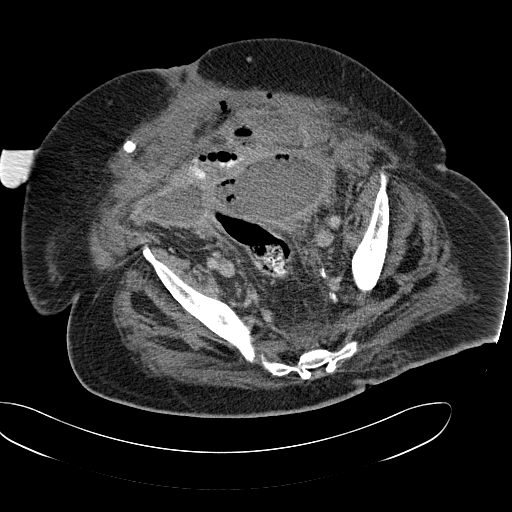
[im 34/101  soft-tissue]
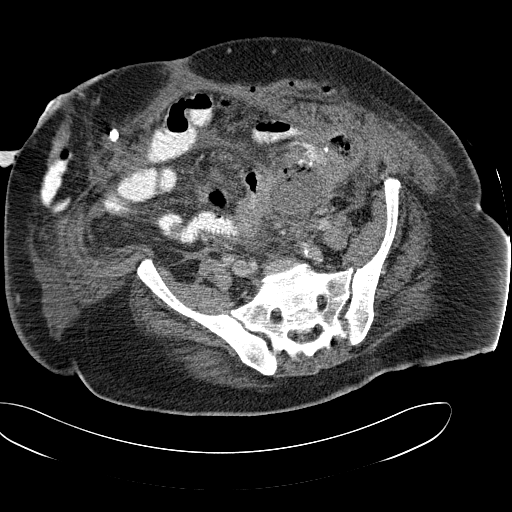
[im 41/101  soft-tissue]
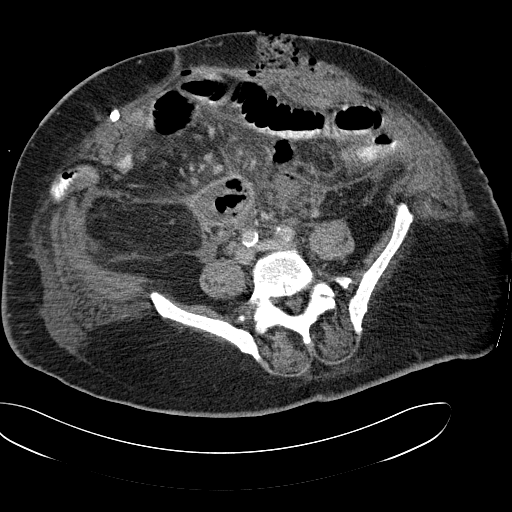
[im 54/101  soft-tissue]
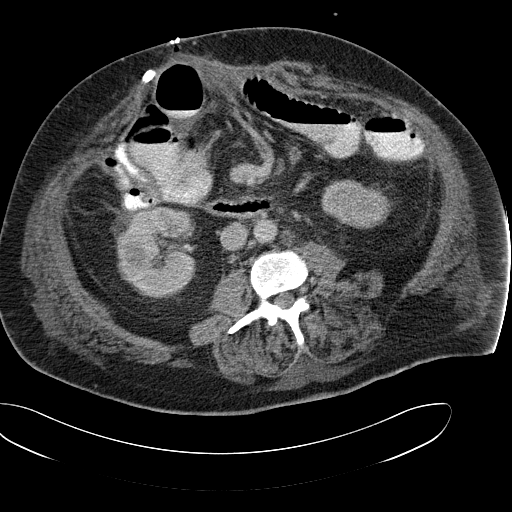
[im 61/101  soft-tissue]
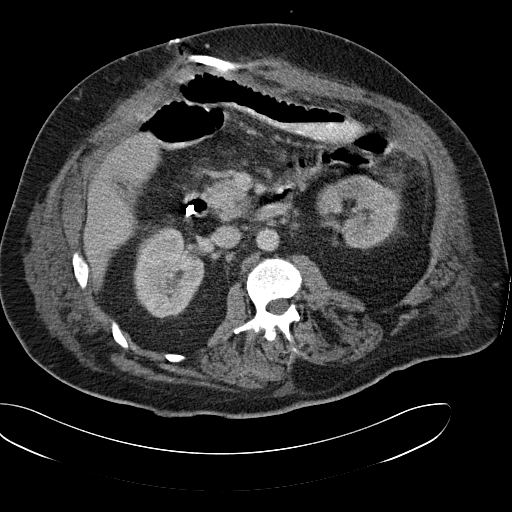
[im 67/101  soft-tissue]
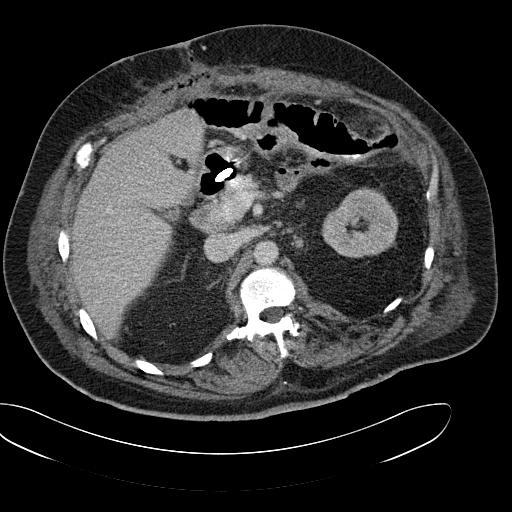
[im 74/101  soft-tissue]
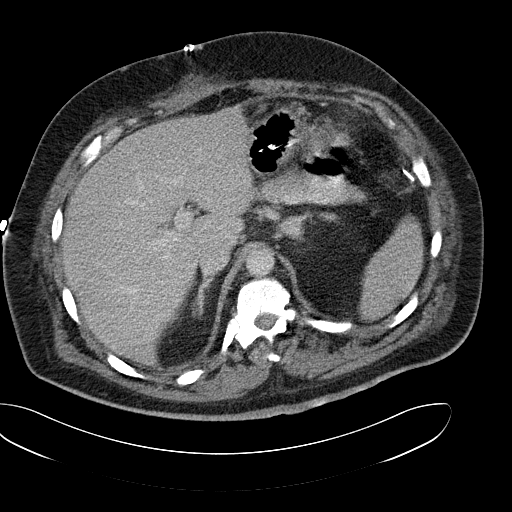
[im 74/101  lung]
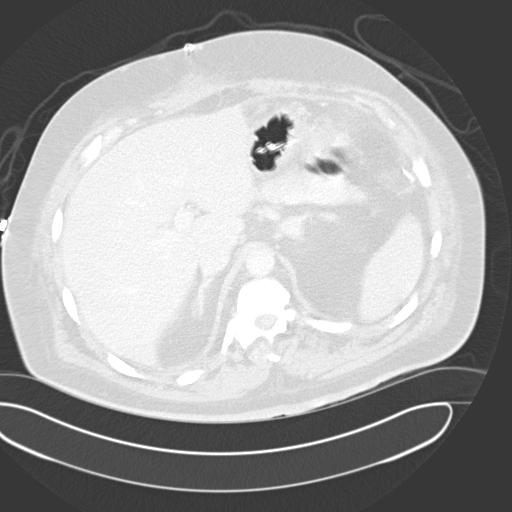
[im 74/101  bone]
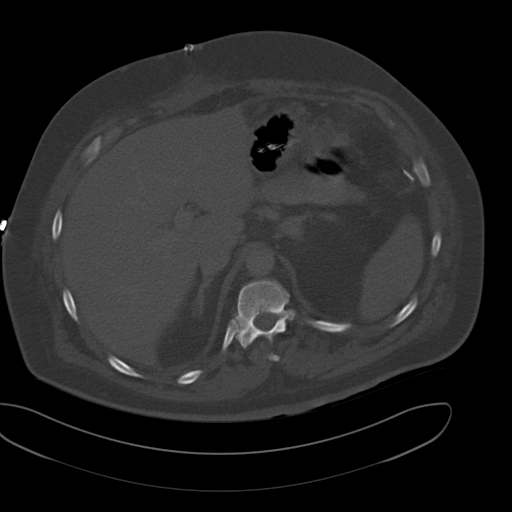
[im 81/101  lung]
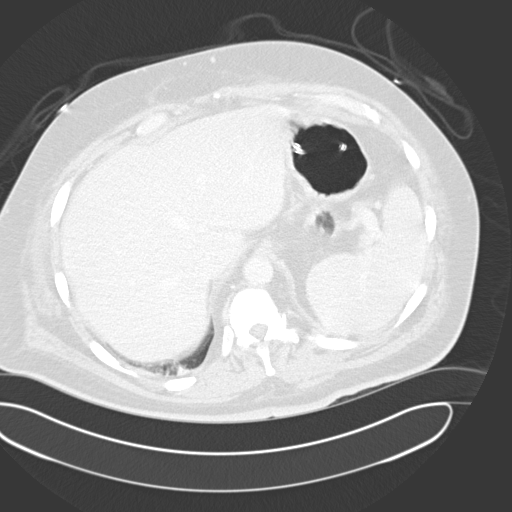
[im 87/101  soft-tissue]
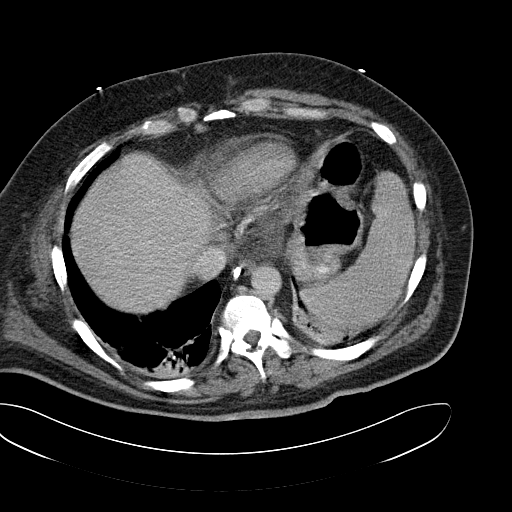
[im 87/101  lung]
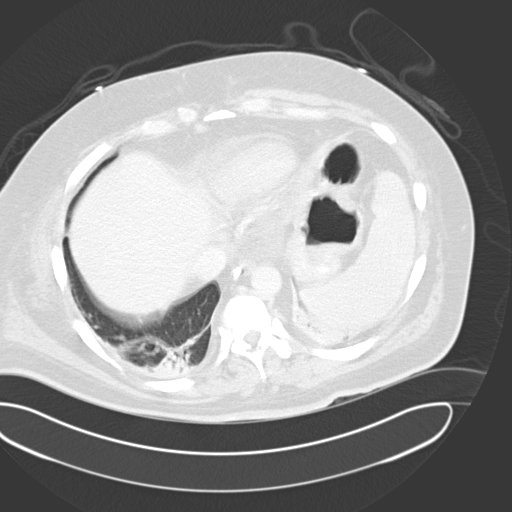
[im 94/101  soft-tissue]
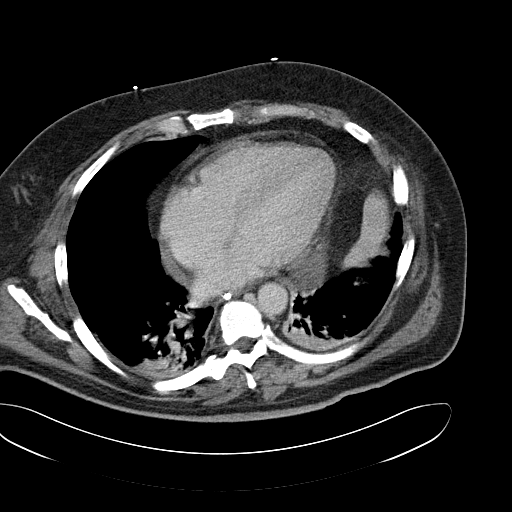
[im 94/101  lung]
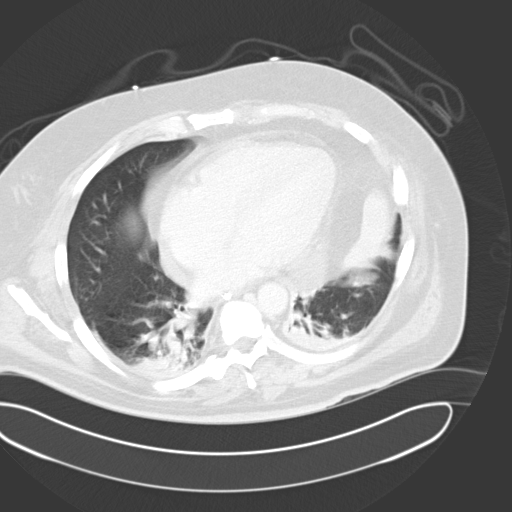

[12 of 32 positions shown; findings below may reference images not displayed]

FINDINGS: Mild bibasilar atelectasis is present.  There is a small amount of pericardial fluid posteriorly.  There are skin clips along the midline with gas along the midline abdominal wound.  There is also some gas tracking along the rectus abdominus musculature which is likely outside the peritoneal cavity.  At the level of the iliac crest, there is thickening of the left rectus muscle with what appears to be packing in a left eccentric abdominal wound, and gas within the rectus muscle and potentially a small amount of free intraperitoneal gas, for example, on image #60 of series 2.  There are air fluid levels and mildly dilated loops of small bowel consistent with ileus.  A drainage catheter tracks along the anterior abdominal wall superficial fascia. 
There is a stable dense lesion in the right mid kidney measuring 1.8 cm in diameter.  This appears similar to the prior exam and could represent a complex cyst, based on the patient?s history of having a dense lesion in this region.
IMPRESSION: 1.  Bibasilar atelectasis.
2.  Small pericardial effusion. 
3.  Probable tiny amount of free intraperitoneal gas.  There is gas along the left rectus muscle deep to the packing in the left anterior abdominal wall wound.  There is also a small amount of gas along the wound in the midline.  
4.  A drainage catheter is present along the superficial fascia.
5.  Complex lesions in the right mid kidney, statistically likely to represent a complex cyst, although not completely characterized as such on today?s exam.  Sonographic correlation may be helpful. 
PELVIS CT WITH CONTRAST:
FINDINGS: There is a prominent pelvic abscess.  One component of it is just below the level of the iliac crests and measures up to 5.4 cm in diameter.  This appears to extend over to the left, to a vertically extending pocket which is tubular in shape, but up to 6.2 cm in diameter.  This gives off two branches, one extending several centimeters toward the left groin and one extending into the right side of the pelvis toward the right groin.  There is gas and complex fluid along with an irregular border associated with this collection, typical for abscess.  A right-sided ostomy is noted.  Mucous fistula is apparent and appears grossly intact.  
There is markedly severe arthropathy of both hips.
On some images, such as image #75 of series 2, we cannot exclude a small amount of free intraperitoneal gas.    There is a suggestion of an irregular bowel anastomosis or fistula along the bowel in the left lower quadrant on image # 65 of series 2, and the abscess is in the vicinity of this finding.
IMPRESSION: 1.  Large C-shaped abscess in the pelvis, possibly with adjacent fistula or anastomotic irregularity. 
2.  Several small probable locules of free intraperitoneal gas.
3.  Irregular anastomosis or fistula suspected in left lower quadrant adjacent to the abcess.

## 2006-12-11 IMAGING — CR DG CHEST 1V PORT
1 series · 1 of 1 positions shown · non-contrast
Comparison: Chest x-ray of 03/16/05.

CLINICAL DATA: Chest pain.  Acute renal failure.  
 PORTABLE CHEST - 1 VIEW, 03/19/05, 9966 HOURS:

[view not recorded]
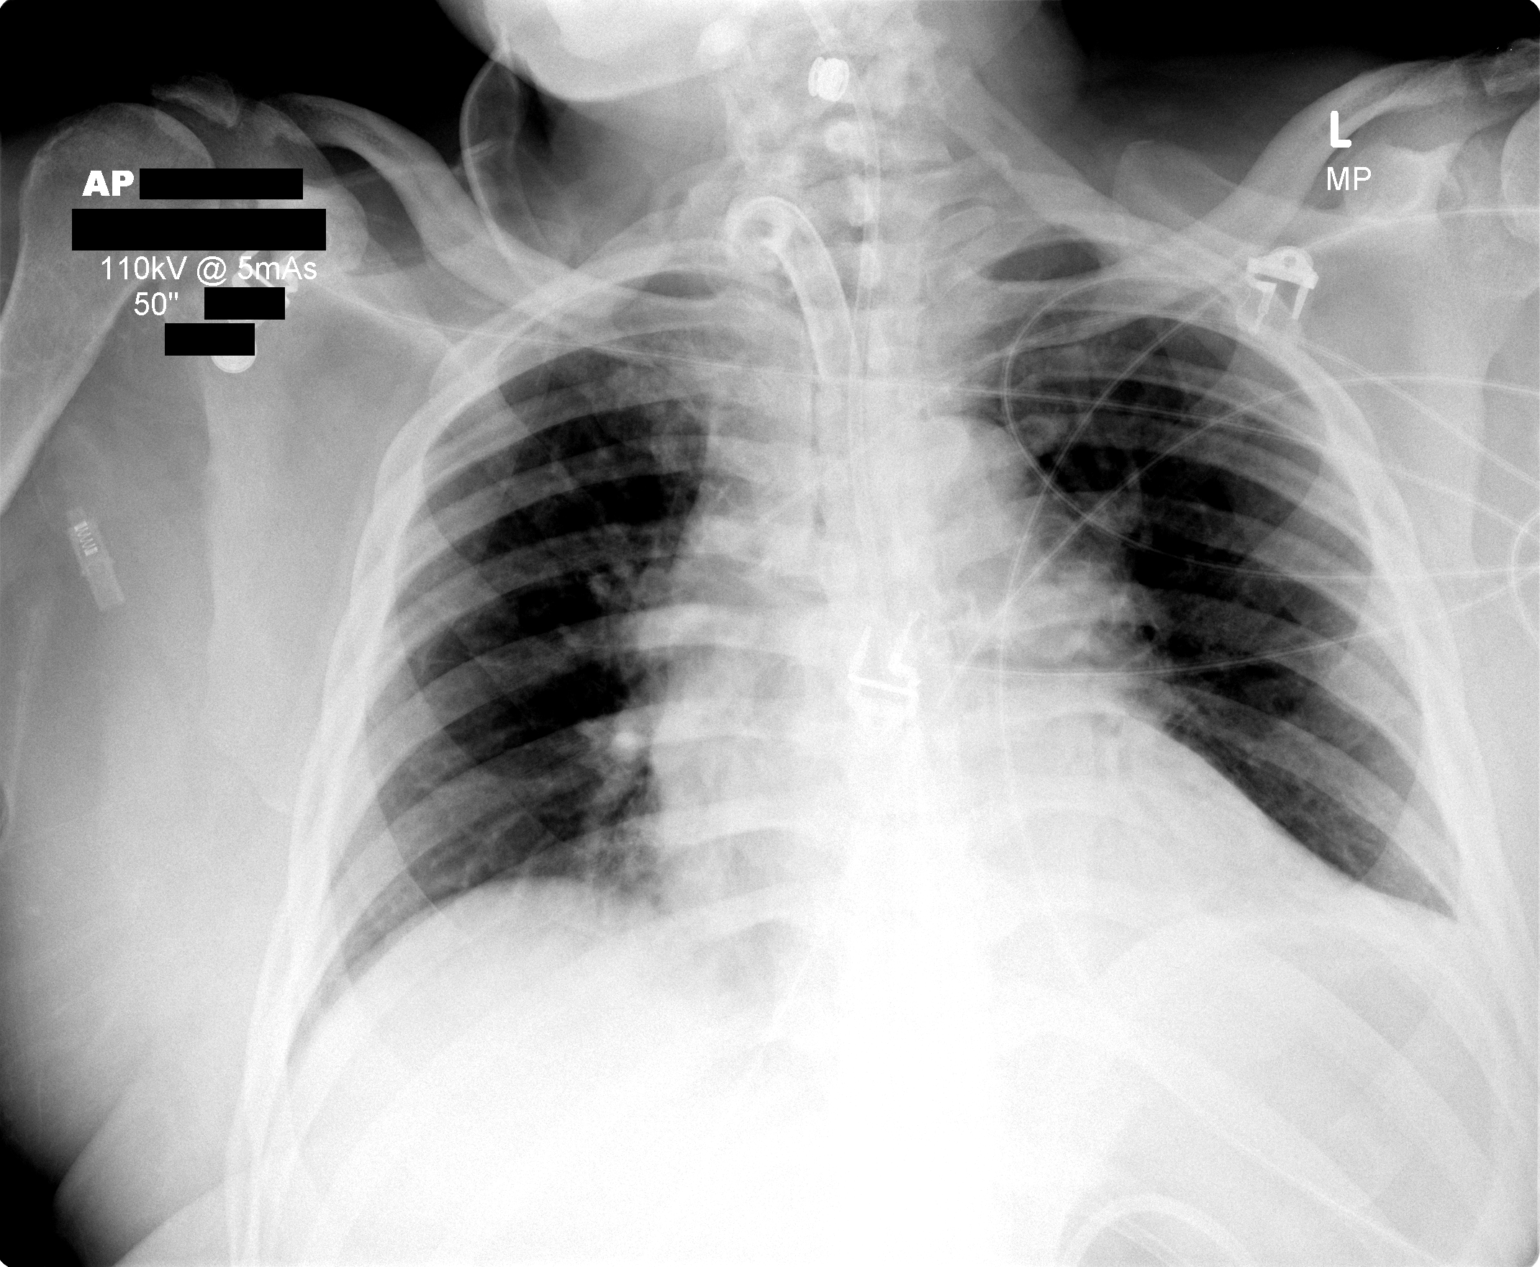

[1 of 1 positions shown; findings below may reference images not displayed]

Mild volume loss at the bases is noted.  No definite infiltrate or effusion is seen.  Mild cardiomegaly is stable.  Tracheostomy and feeding tube remain with no change in left central venous catheter position.
IMPRESSION: No change.  Mild basilar atelectasis.

## 2006-12-12 IMAGING — CR DG ABD PORTABLE 1V
1 series · 1 of 1 positions shown · non-contrast
Comparison: none

CLINICAL DATA: Panda placement.  Acute renal failure. 
 PORTABLE ABDOMEN - 1 VIEW - 03/20/05 AT 0638 HOURS:
 Panda tube has been placed and lies with its tip along the greater curvature near the pylorus.  Bowel gas pattern suggests partial obstruction may be present.  Pigtail catheter is seen within the pelvis.

[view not recorded]
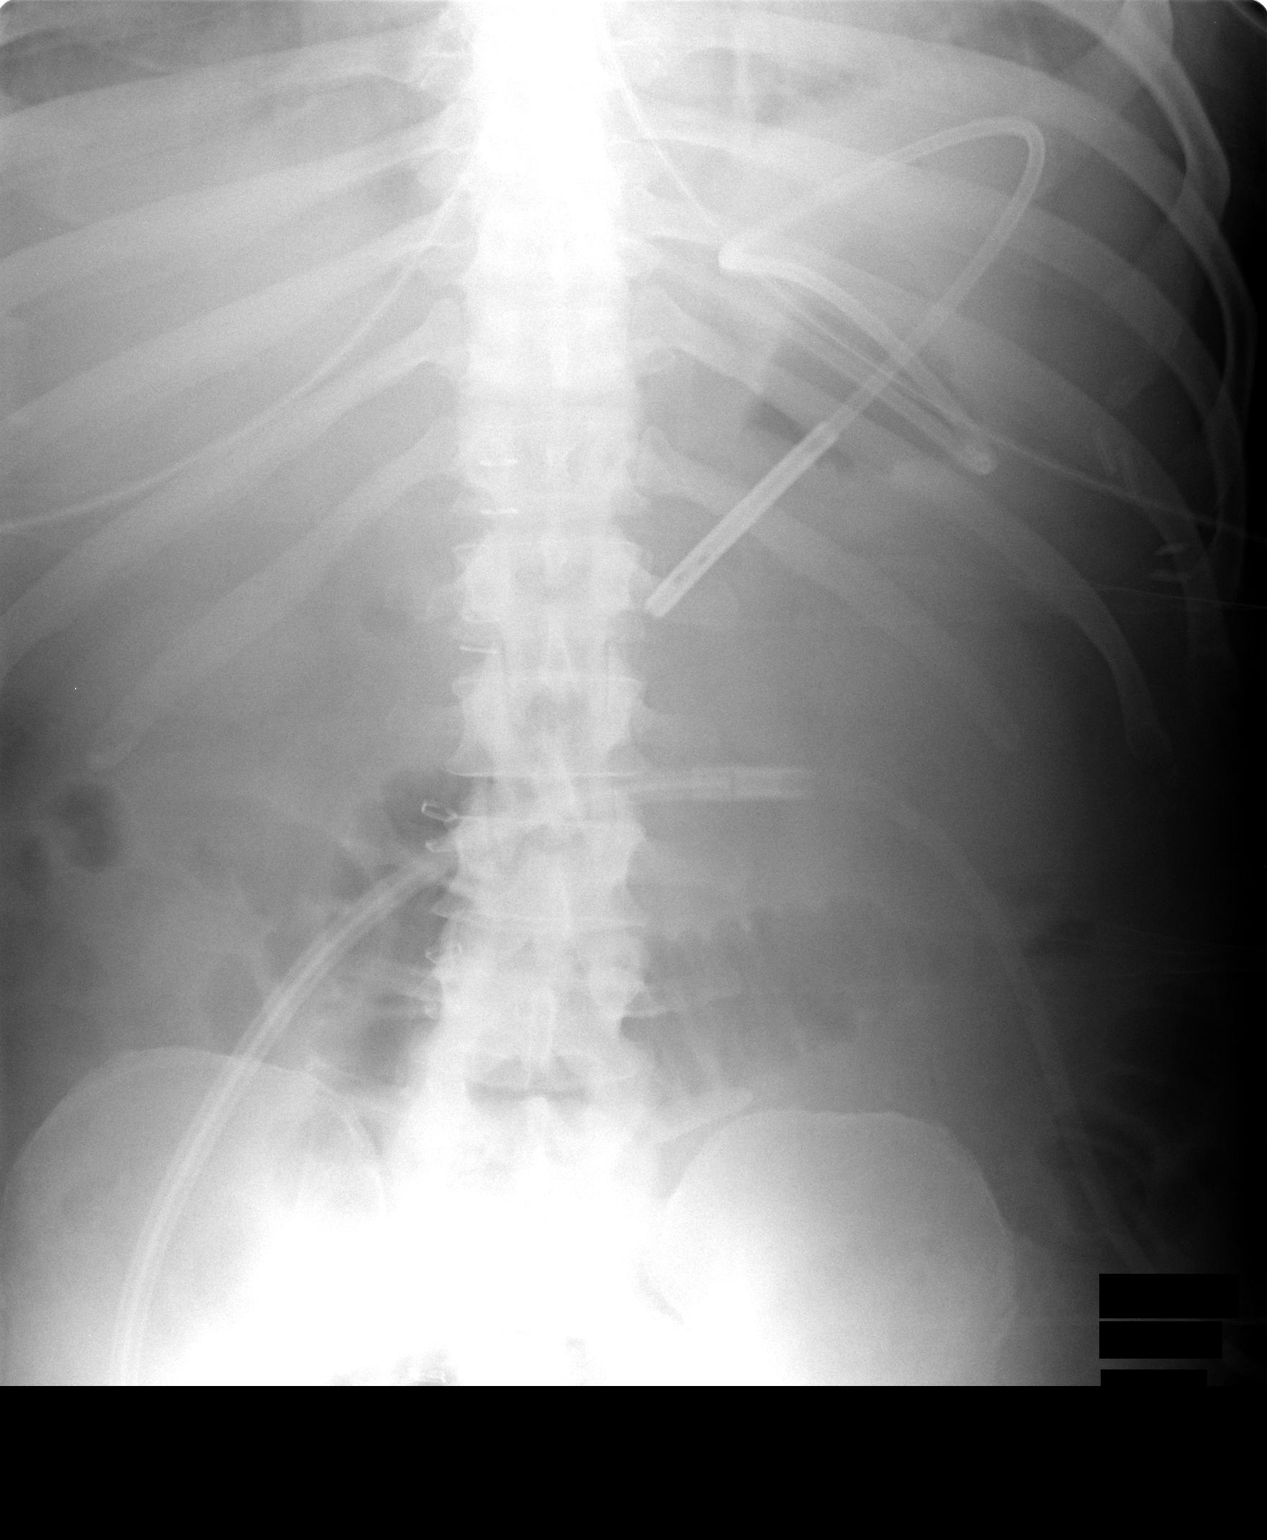

[1 of 1 positions shown; findings below may reference images not displayed]

IMPRESSION: Panda tube tip along the greater curvature of the stomach, near the pylorus.

## 2006-12-13 IMAGING — RF DG INTRO LONG GI TUBE
1 series · 3 of 3 positions shown · non-contrast
Comparison: none

CLINICAL DATA: Panda tube position.    
 INTRO LONG GI TUBE: 
 Panda tube was coiled in the stomach.  Panda tube tip was positioned in the 3rd portion of the duodenum.  The procedure was done at bedside in the ICU.

[Series 1: run · 3 of 3 slices shown]
[im 1/3]
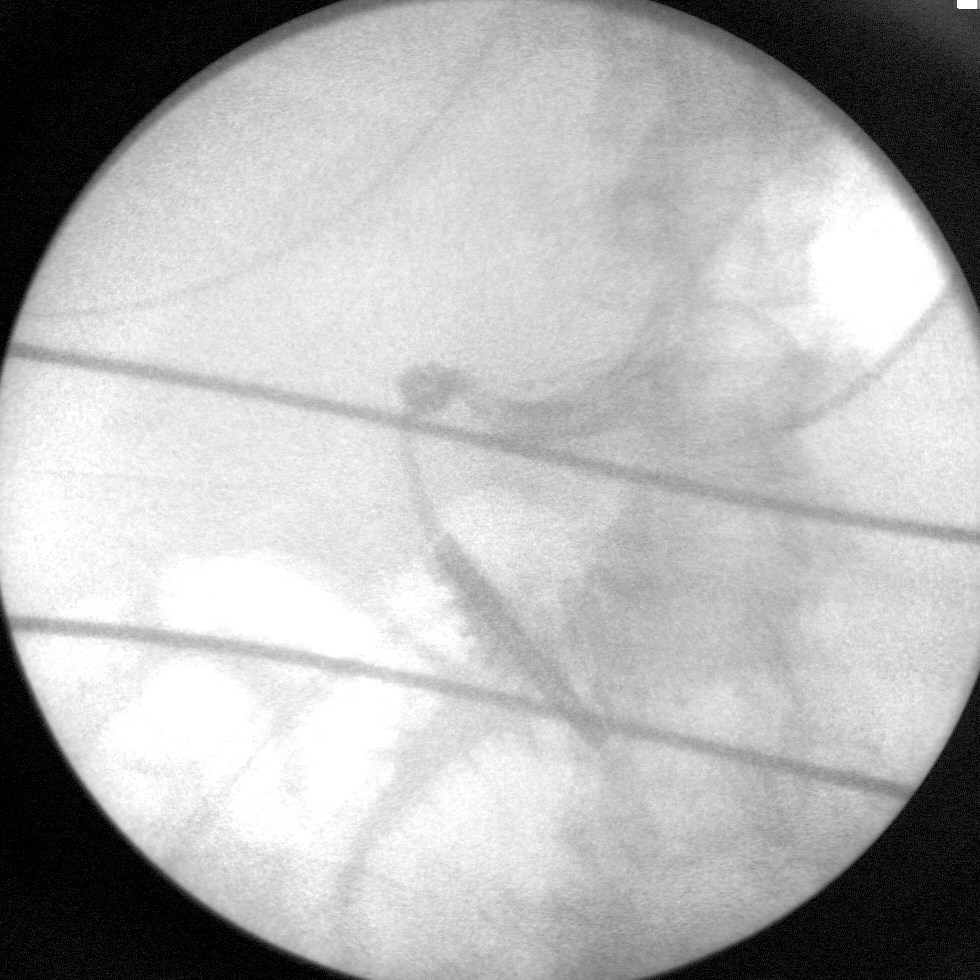
[im 2/3]
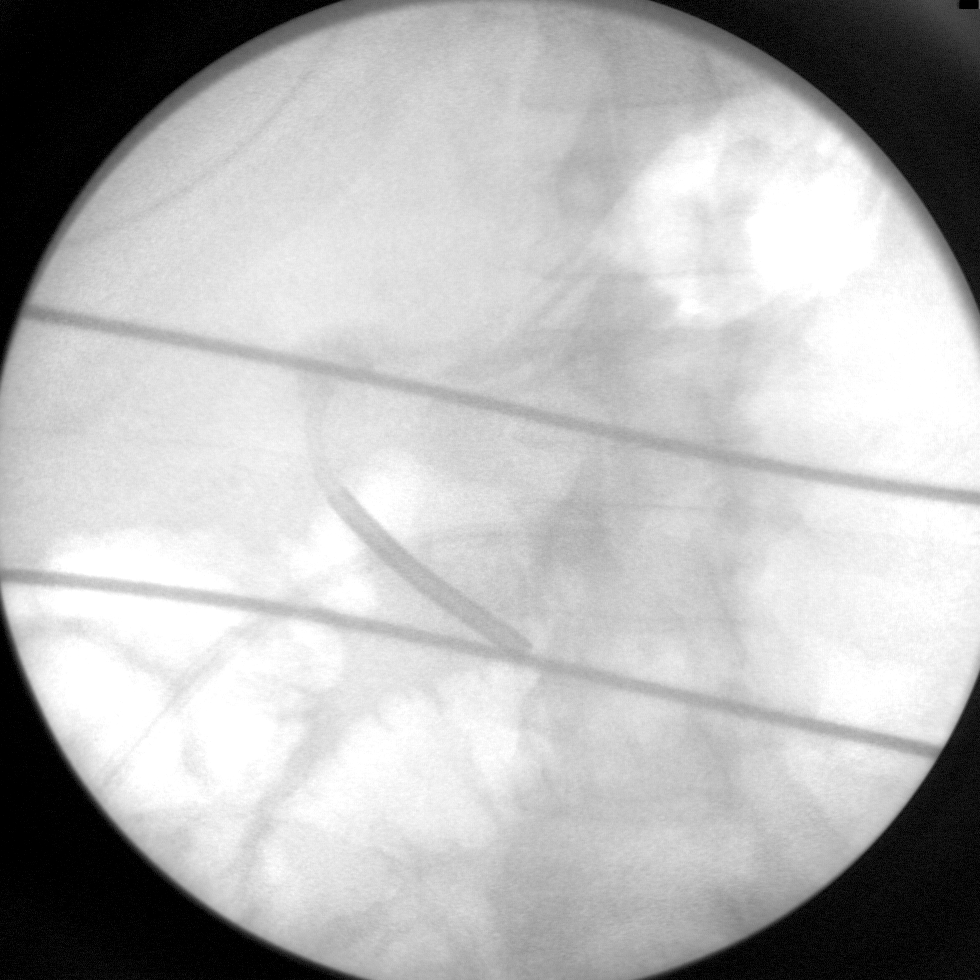
[im 3/3]
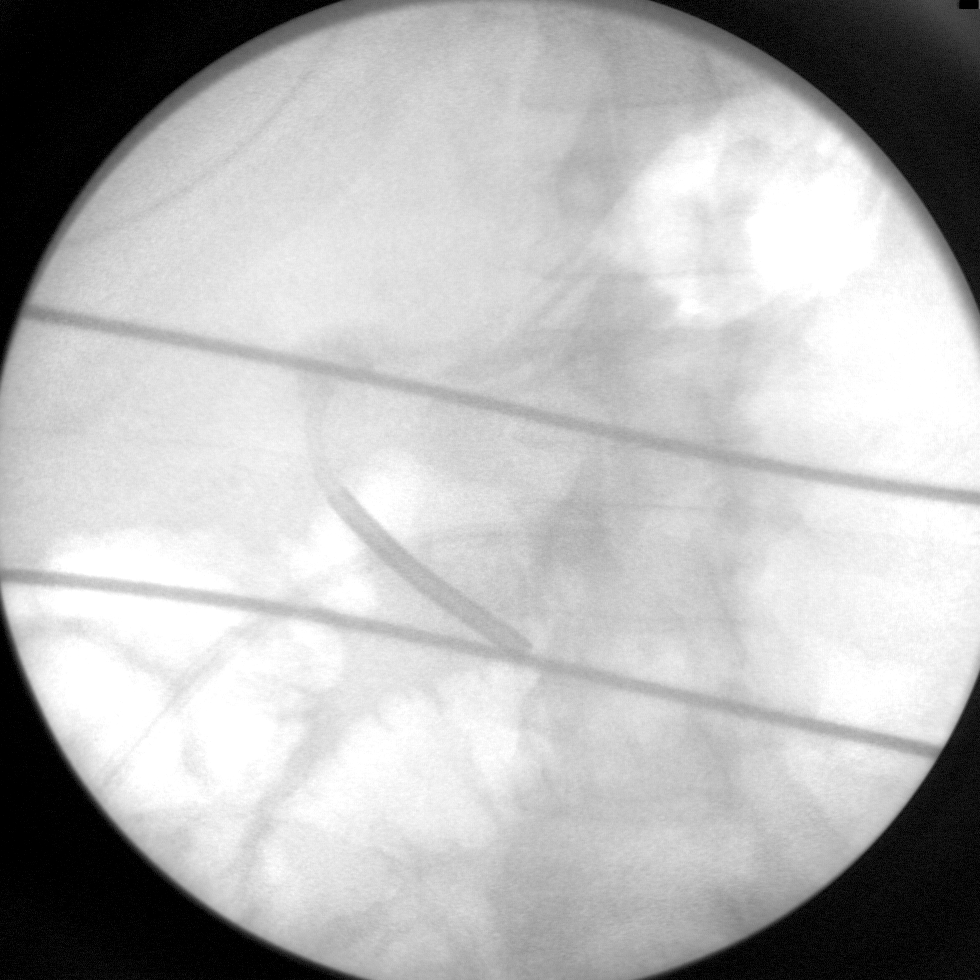

[3 of 3 positions shown; findings below may reference images not displayed]

IMPRESSION: Panda tube tip was positioned in the 3rd portion of the duodenum.

## 2006-12-21 IMAGING — CT CT PELVIS W/ CM
1 of 4 series · 13 of 32 positions shown, 18 images · IV contrast (omnipaque)
Comparison: 03/18/2005.

ABDOMEN CT WITH CONTRAST

CLINICAL DATA: Followup pelvic abscess. Afebrile.
TECHNIQUE: Multidetector CT imaging of the abdomen and pelvis was performed
following the standard protocol during bolus administration of intravenous
contrast.

Contrast:  125 cc Omnipaque 300

[Series 3: abd_pel 5.0 b40f st · axial · 0.91mm/px · z∈[+1416,+1806]mm · 13 of 90 slices shown, 18 images]
[im 6/90  soft-tissue]
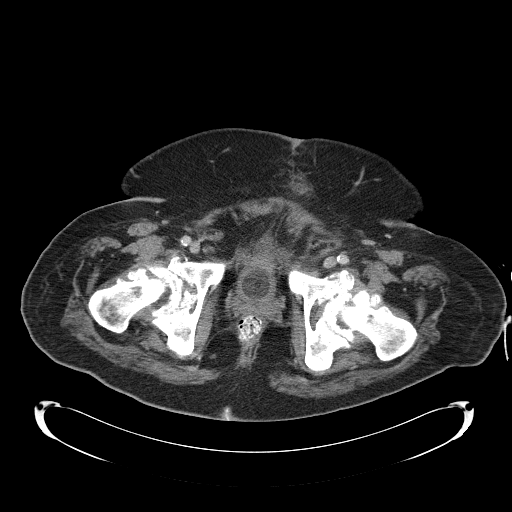
[im 6/90  bone]
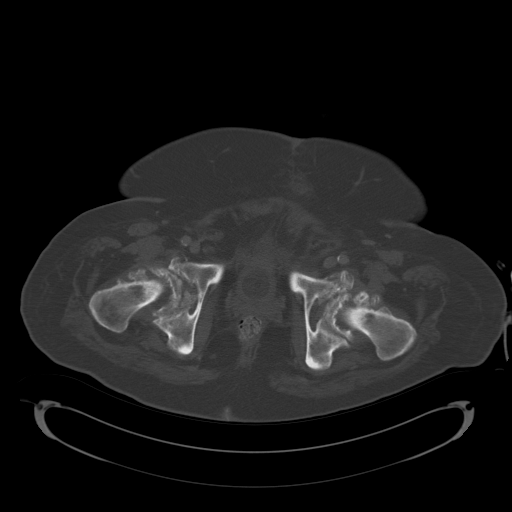
[im 16/90  soft-tissue]
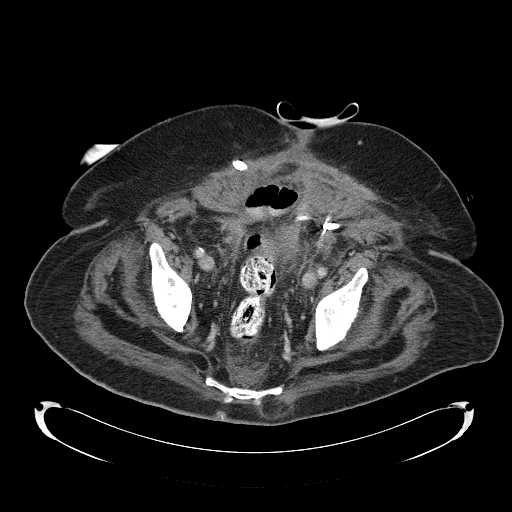
[im 21/90  soft-tissue]
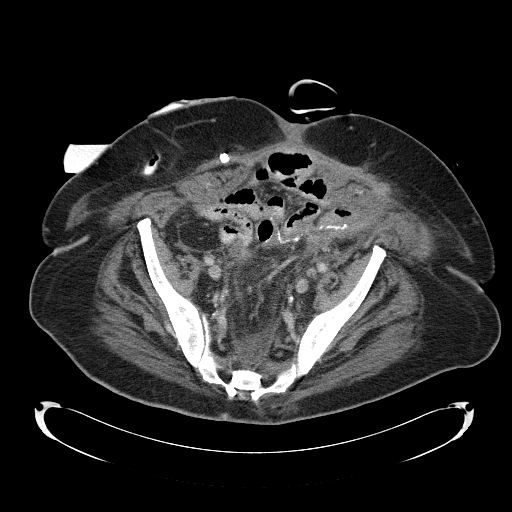
[im 27/90  soft-tissue]
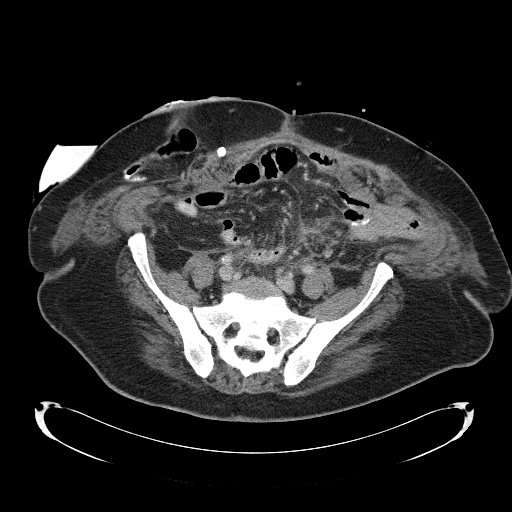
[im 37/90  soft-tissue]
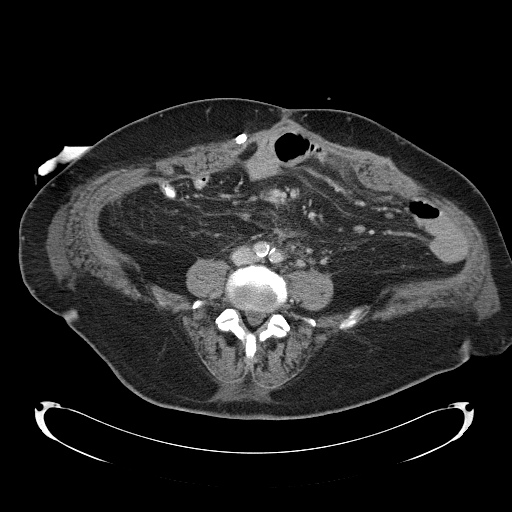
[im 42/90  soft-tissue]
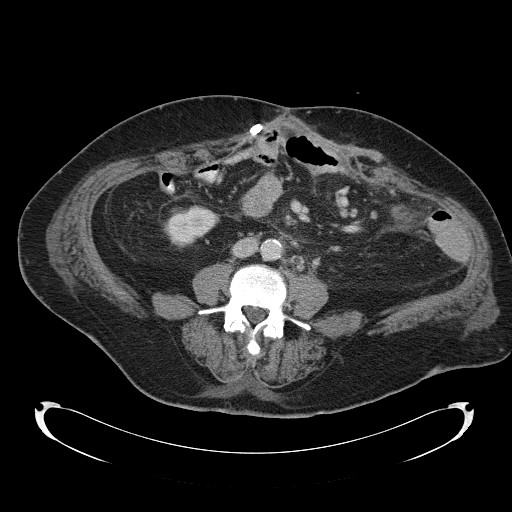
[im 48/90  soft-tissue]
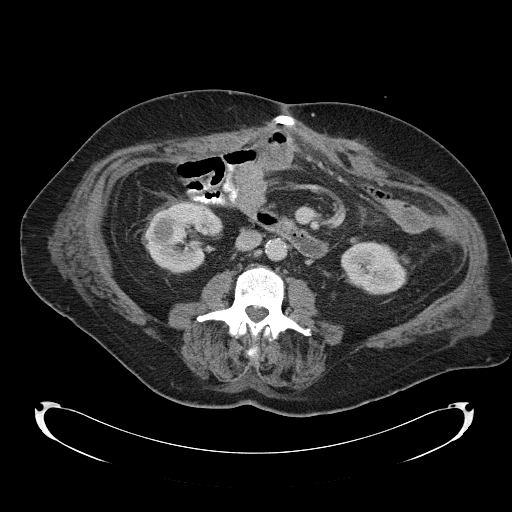
[im 58/90  soft-tissue]
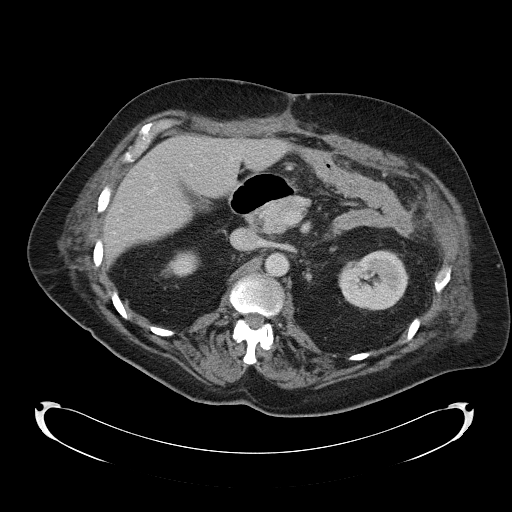
[im 63/90  soft-tissue]
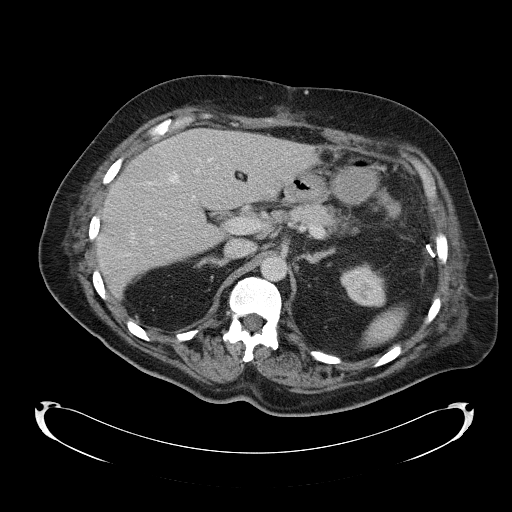
[im 63/90  bone]
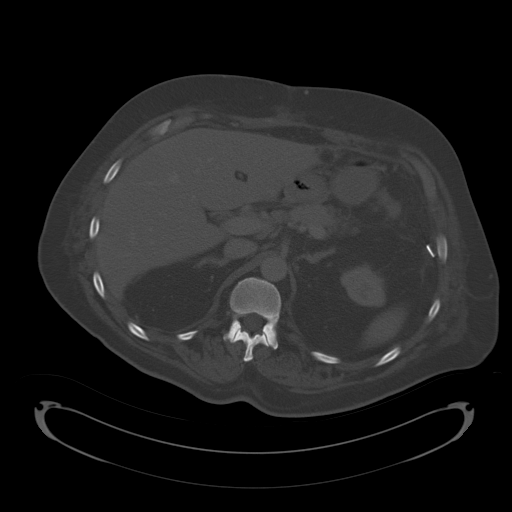
[im 69/90  soft-tissue]
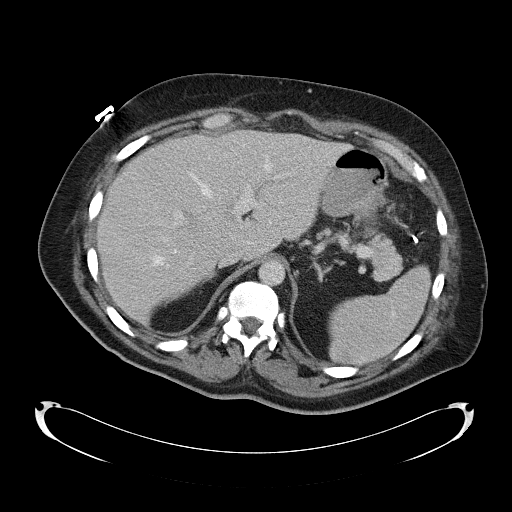
[im 69/90  lung]
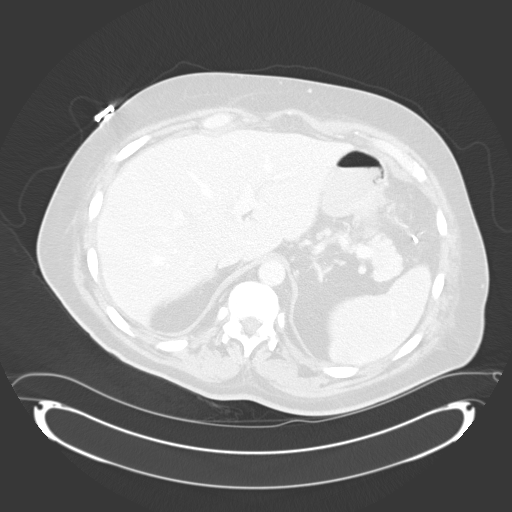
[im 74/90  lung]
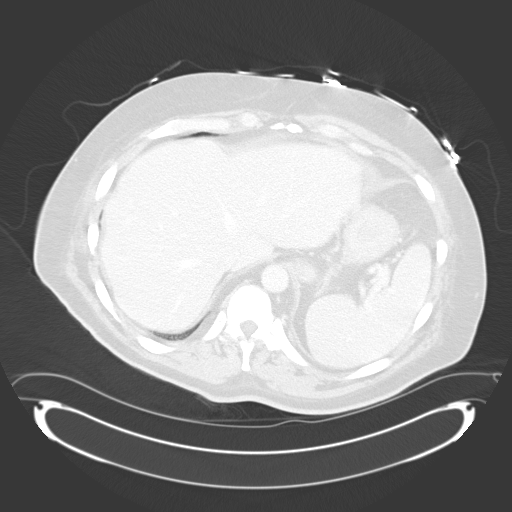
[im 79/90  soft-tissue]
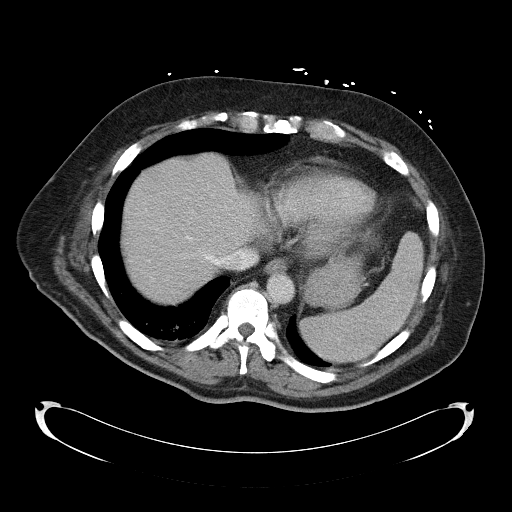
[im 79/90  lung]
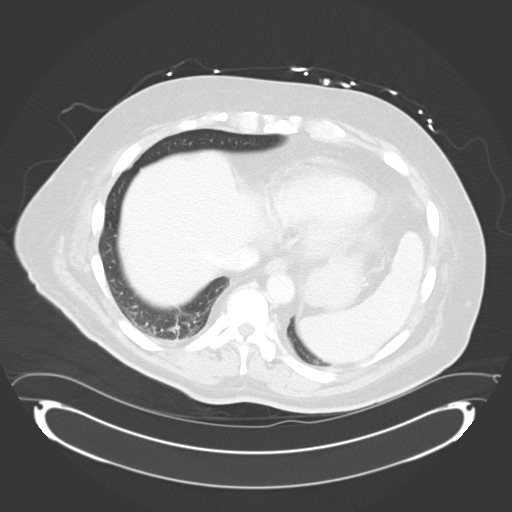
[im 84/90  soft-tissue]
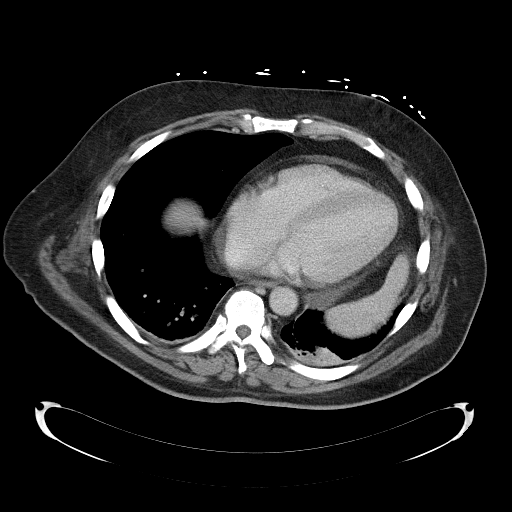
[im 84/90  lung]
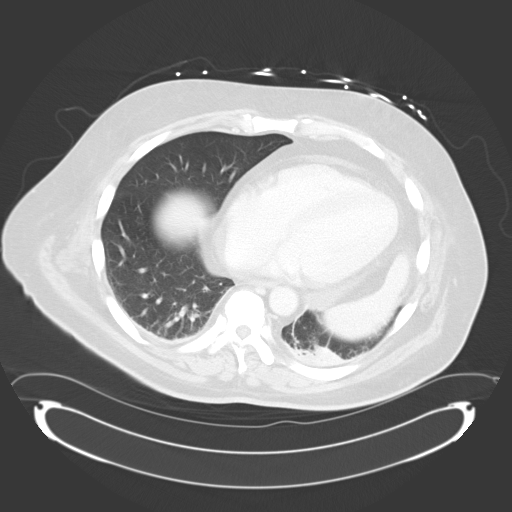

[13 of 32 positions shown; findings below may reference images not displayed]

FINDINGS: Improved left lower lobe atelectasis and almost completely resolved
right lower lobe atelectasis. No significant change in amount of pericardial
fluid, measuring 2.3 cm in maximum thickness. The heart remains mildly enlarged.
Stable medium density left adrenal mass, measuring 2.1 x 2.0 cm in maximum
dimensions. Poorly distended gallbladder with mild diffuse wall thickening. The
gallbladder is better distended than on the previous examination. No visible
gallstones and no pericholecystic fluid seen. The previously demonstrated 1.8 cm
complex, mid right renal cyst is slightly larger, currently measuring 2.1 cm in
maximum diameter. This is smoothly marginated and measures 44 Hounsfield units
in density centrally. Stable 2.2 cm simple appearing mid left renal cyst and
small simple appearing right renal cysts. Normal appearing liver, spleen,
pancreas and right adrenal gland. A left anterior drain is unchanged, remaining
at the anterior aspect of the rectus sheath, with no surrounding fluid. A tiny
amount of residual air or gas is demonstrated in the midline surgical scar,
significantly improved. The overlying skin clips have been removed. No fluid
collections are seen within the abdomen.

IMPRESSION

1. Improved left lower lobe atelectasis and almost completely resolved right
lower lobe atelectasis.
2. No abscess seen within the abdomen.
3. Stable moderate sized pericardial effusion.
4. Stable 2.1 cm left adrenal mass, most likely representing an adenoma.
5. Slight interval increase in size of the medium density, probable complex cyst
in the mid right kidney. This most likely represents a cyst complicated by
hemorrhage or infection. A solid masses less likely.
6. Stable small bilateral simple appearing renal cysts.

PELVIS CT WITH CONTRAST
FINDINGS: Interval left anterior pigtail catheter within the previously
demonstrated pelvic abscess. No residual fluid at that location. There is a
small residual fluid collection in the upper pelvis, to the left of midline.
This measures 1.8 x 0.9 cm in maximum dimensions with surrounding soft tissue
thickening. A right anterior ostomy is unchanged. No new fluid collections are
seen. A Foley catheter is in the urinary bladder with a small amount of
associated air in the bladder. No significant urine in the bladder. Mild
presacral edema is unchanged.

IMPRESSION

1. Resolved pelvic abscess with the exception of a small residual fluid
collection, as described above.
2. Stable mild presacral edema.

## 2007-04-04 IMAGING — CT CT PELVIS W/ CM
2 of 5 series · 15 of 46 positions shown, 17 images · IV contrast (omnipaque)
Comparison: none

CLINICAL DATA: Evaluate abscess.  Colon perforation with colostomy.
ABDOMEN CT WITH CONTRAST:
TECHNIQUE: Multidetector CT imaging of the abdomen was performed following the standard protocol during bolus administration of intravenous contrast.
Contrast:  125 cc Omnipaque 300.
TECHNIQUE: Multidetector CT imaging of the pelvis was performed following the standard protocol during bolus administration of intravenous contrast.

[Series 2: abd_pel 5.0 b40f st · axial · 0.86mm/px · z∈[-351,+94]mm · 12 of 99 slices shown, 14 images]
[im 5/99  soft-tissue]
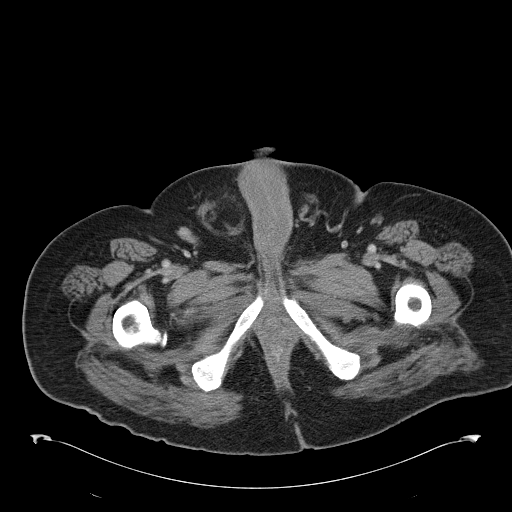
[im 5/99  bone]
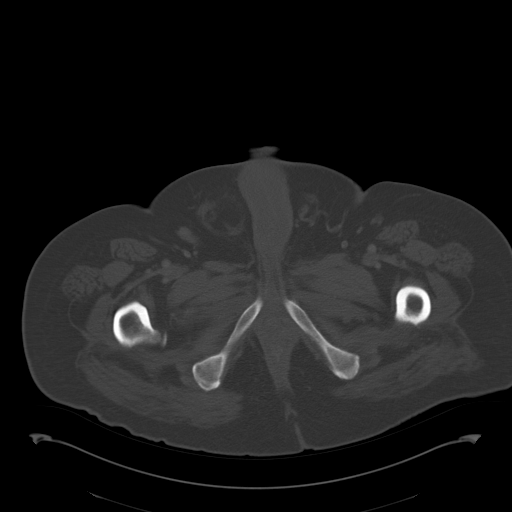
[im 15/99  soft-tissue]
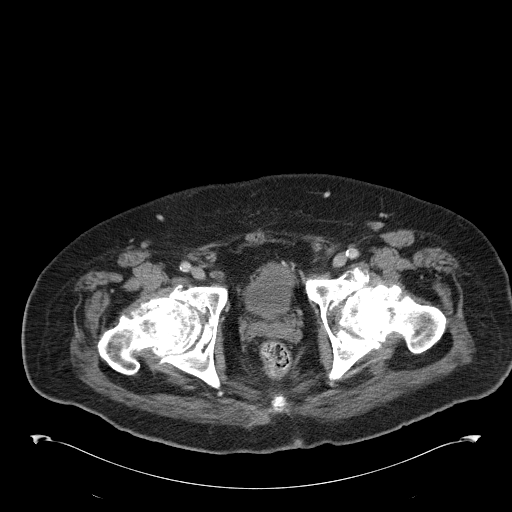
[im 24/99  soft-tissue]
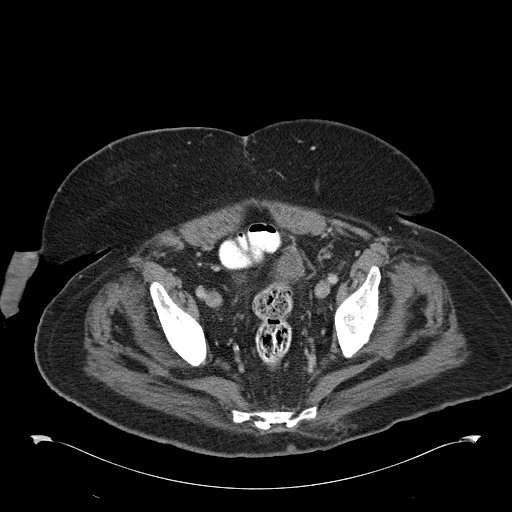
[im 29/99  soft-tissue]
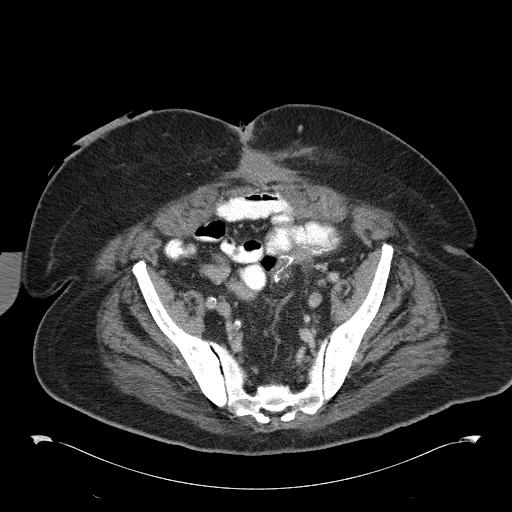
[im 38/99  soft-tissue]
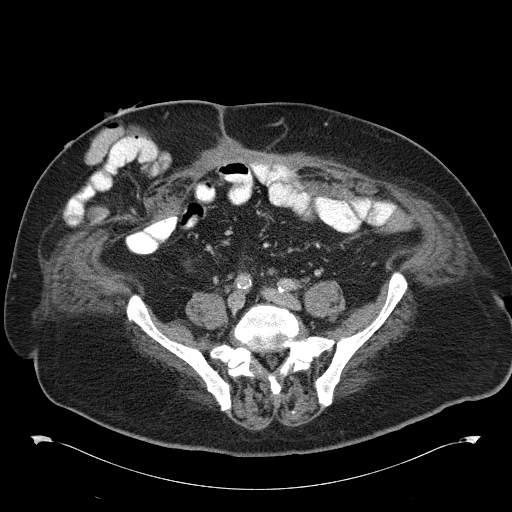
[im 47/99  soft-tissue]
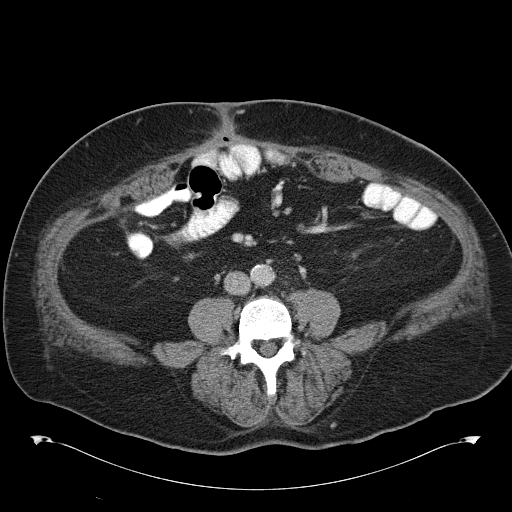
[im 52/99  soft-tissue]
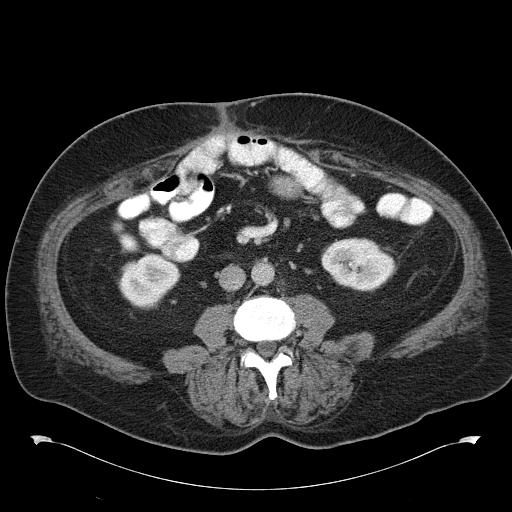
[im 61/99  soft-tissue]
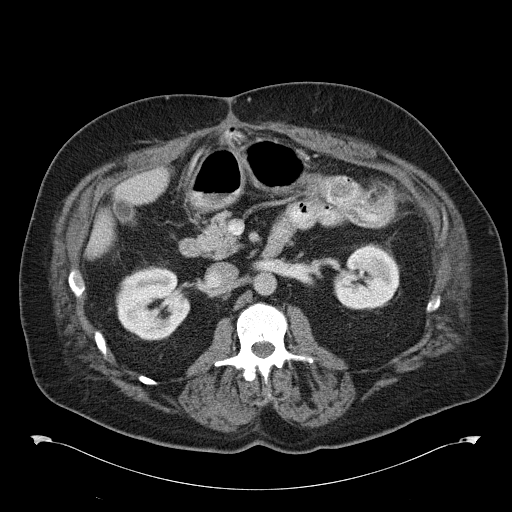
[im 71/99  soft-tissue]
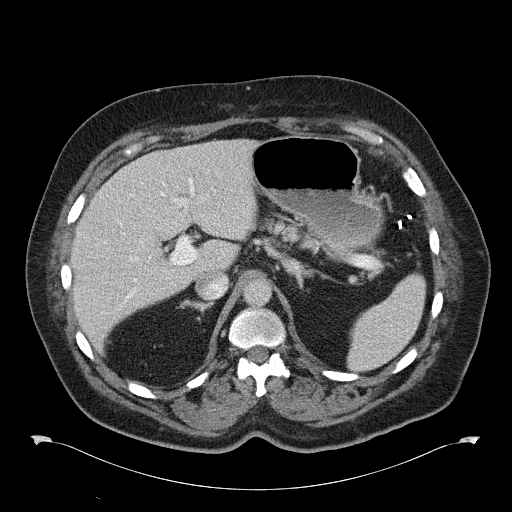
[im 71/99  bone]
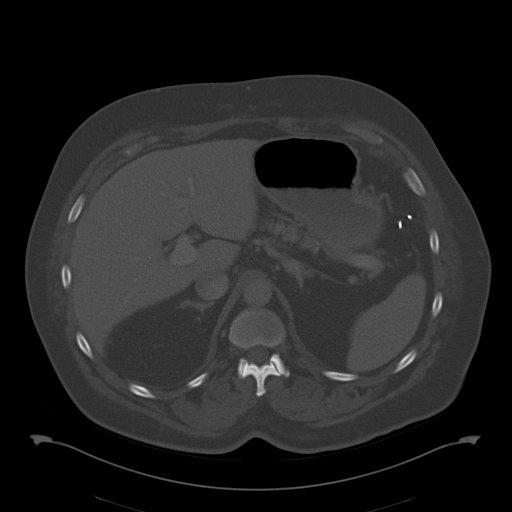
[im 75/99  soft-tissue]
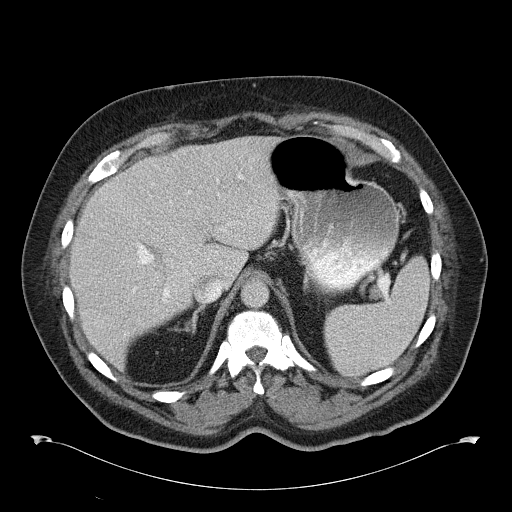
[im 85/99  soft-tissue]
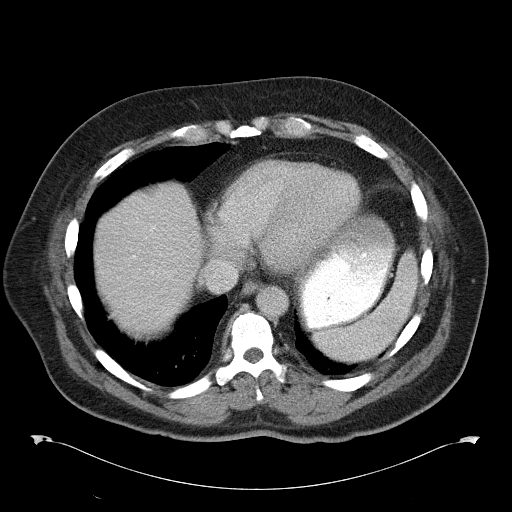
[im 94/99  soft-tissue]
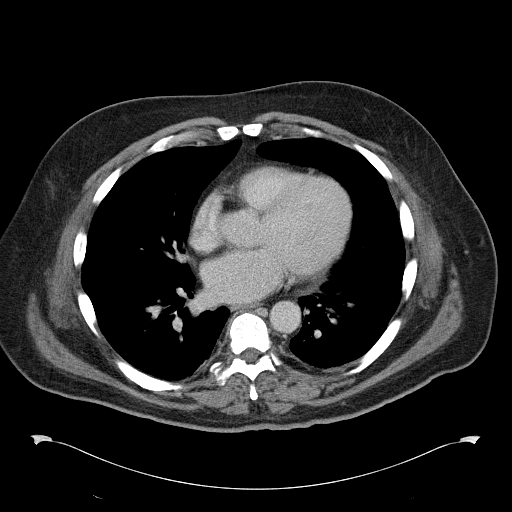

[Series 602: cor · coronal · 1.00mm/px · 3 of 50 slices shown]
[im 17/50  soft-tissue]
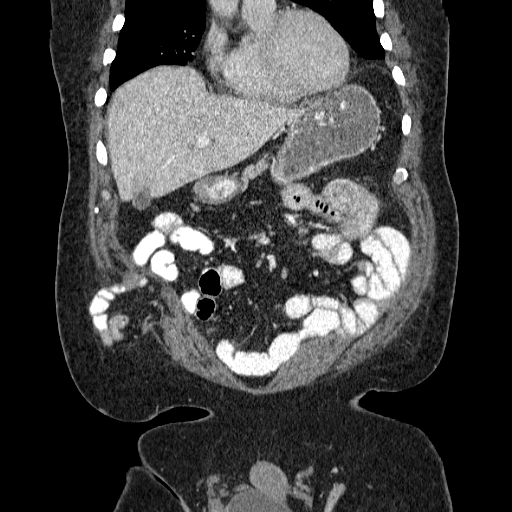
[im 22/50  soft-tissue]
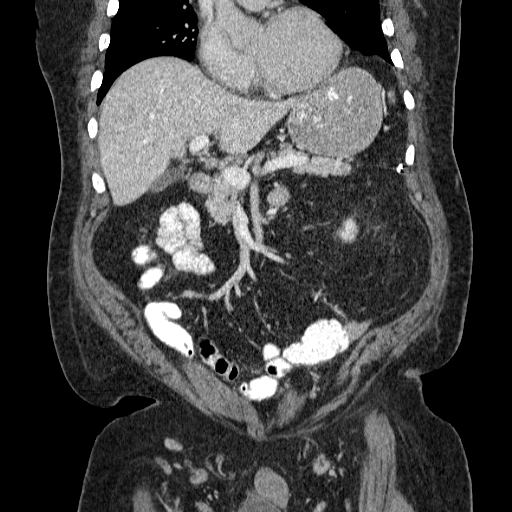
[im 28/50  soft-tissue]
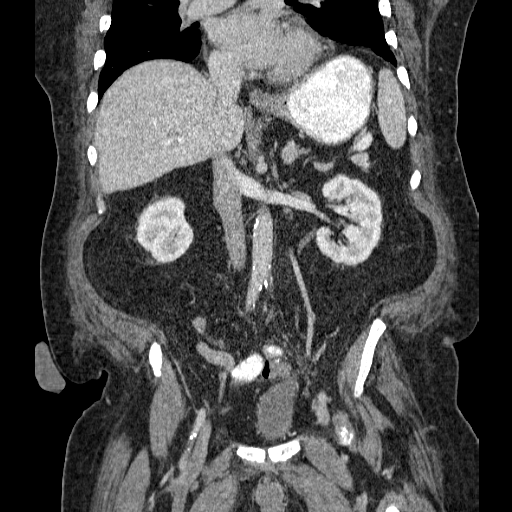

[15 of 46 positions shown; findings below may reference images not displayed]

FINDINGS: Probable subpleural lymph node along the right minor fissure on image 3.  Left hemidiaphragm is elevated with minimal overlying atelectasis.  The heart is mildly enlarged.  There has been near complete resolution of the focal posterior pericardial fluid.  There is no pleural fluid.  The liver, spleen, stomach, pancreas, gallbladder, and right adrenal gland are normal.  Similar 2-cm nodule in the left adrenal gland is indeterminate but most likely an adenoma.  
  There is a left lower pole renal cyst, which measures less than 1 cm.  There is a 1.5-cm lesion in the interpolar right kidney that measures between 45 and 55 Hounsfield units on both portal venous phase and delayed images.  This is nonspecific.  Other smaller lesions are seen in both kidneys, including a lower pole right renal lesion.  These are too small to characterize.  Small nodes at the root of the sigmoid mesocolon are likely reactive. There is no abdominal adenopathy. 
Evaluation of the bowel demonstrates normal appearance of the rectum.  There are sutures in the region of the sigmoid, likely related to a rectal stump.  There is a right lower quadrant ileostomy.  There is an increased number of bowel loops within the ostomy defect, consistent with a peristomal hernia. 
On image 43, there is likely an enteroenteric intussusception.  This does not persist, and there is no evidence of obstruction at this site.  It is, therefore, likely transient and clinically insignificant.  
  There has been resolution of the previously described lower abdominal collection.  Only minimal residual fascial thickening is seen on image 65.  There is fascial thickening involving the anterior abdominal wall in the midline.  There is no drainable collection identified.  This is seen on image 68, more superiorly.  Small locules of air are seen on image 54.  A percutaneous drain has been removed from the ventral subcutaneous tissues.  The upper left pelvic drain has also been removed.  Just deep to the subcutaneous air, there are bowel loops, which are tethered to the anterior abdominal wall.  There is no contrast within the anterior subcutaneous tissues to confirm enterocutaneous fistula.
IMPRESSION: 1.  Resolution of lower left abdominal fluid collection.  Removal of two drains. 
2.  Slight increase in lower abdominal midline fascial thickening and foci of subcutaneous air just anterior to tethered small bowel loops.  Cannot exclude enterocutaneous fistula.  No contrast is seen within the anterior subcutaneous tissues to confirm this entity. 
3.  Right lower quadrant ostomy with peristomal hernia containing 2 or 3 loops of bowel. 
4.  Complex cyst versus solid lesion in the right kidney.  This is indeterminate on these postcontrast studies.  Ultrasound could be useful for further evaluation if desired.
5.  Left renal cyst. 
6.  Marked decrease of focal left pericardial fluid. 
7.  Left adrenal lesion, most likely an adenoma. 
PELVIS CT WITH CONTRAST:
FINDINGS: Prominent, likely inflammatory left inguinal lymph nodes.  Pelvic bowel loops are normal.  No ascites.  Urinary bladder is incompletely distended.  There is likely a right-sided hydrocele, which is incompletely imaged.  There is gynecomastia.  Bone windows demonstrate age-advanced degenerative changes of the hips.
IMPRESSION: 1.  Resolution of upper pelvic abscess, as described in the abdomen section. 
2.  No acute process in the pelvis.

## 2007-07-27 ENCOUNTER — Emergency Department (HOSPITAL_COMMUNITY): Admission: EM | Admit: 2007-07-27 | Discharge: 2007-07-27 | Payer: Self-pay | Admitting: Family Medicine

## 2007-08-10 ENCOUNTER — Emergency Department (HOSPITAL_COMMUNITY): Admission: EM | Admit: 2007-08-10 | Discharge: 2007-08-10 | Payer: Self-pay | Admitting: Emergency Medicine

## 2008-02-11 ENCOUNTER — Encounter: Admission: RE | Admit: 2008-02-11 | Discharge: 2008-02-11 | Payer: Self-pay | Admitting: General Surgery

## 2008-02-18 ENCOUNTER — Encounter: Admission: RE | Admit: 2008-02-18 | Discharge: 2008-02-18 | Payer: Self-pay | Admitting: General Surgery

## 2009-11-04 IMAGING — CT CT PELVIS W/ CM
2 of 4 series · 16 of 46 positions shown, 18 images · IV contrast (READICAT/WATER & [ID] OMNI 300)
Comparison: 07/11/2005 and 02/16/2005

CT ABDOMEN

CLINICAL DATA: History of diverticular abscess.  Evaluate for
colostomy takedown.

CT ABDOMEN AND PELVIS WITH CONTRAST
TECHNIQUE: Multidetector CT imaging of the abdomen and pelvis was
performed using the standard protocol following bolus
administration of intravenous contrast. The patient was given oral
and rectal contrast as well.
Contrast: 125 ml Ymnipaque-KGG

[Series 4: routine abdomen · axial · 0.98mm/px · z∈[-428,+12]mm · 13 of 98 slices shown, 15 images]
[im 5/98  soft-tissue]
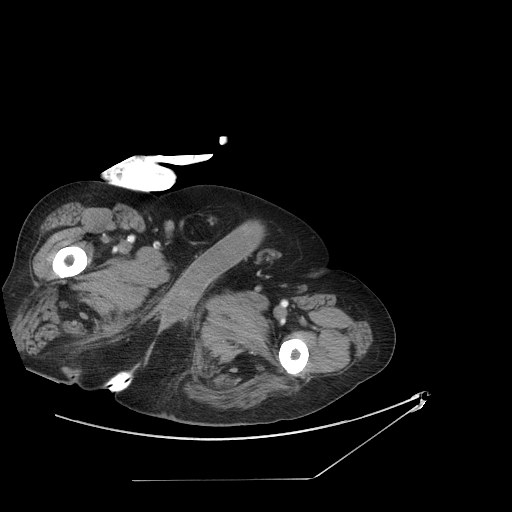
[im 5/98  bone]
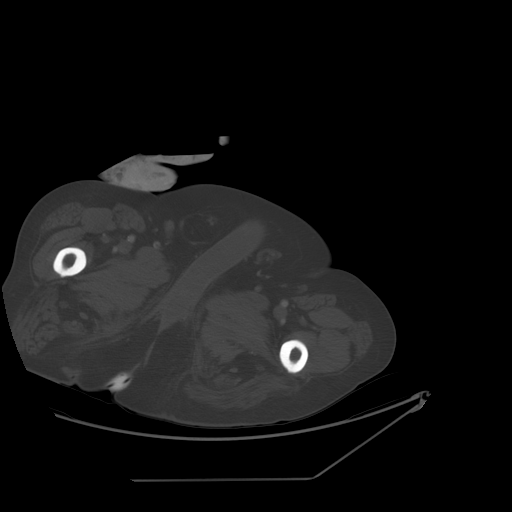
[im 13/98  soft-tissue]
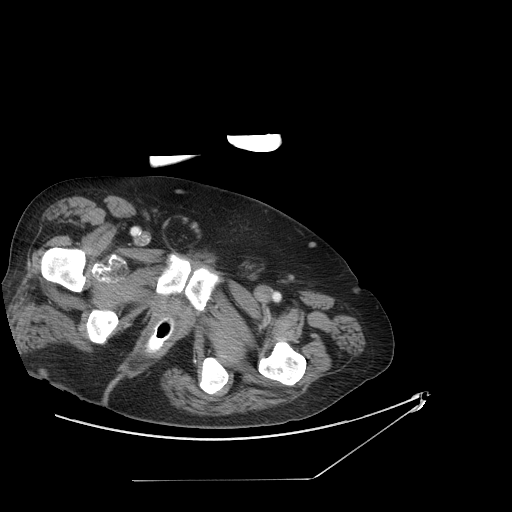
[im 21/98  soft-tissue]
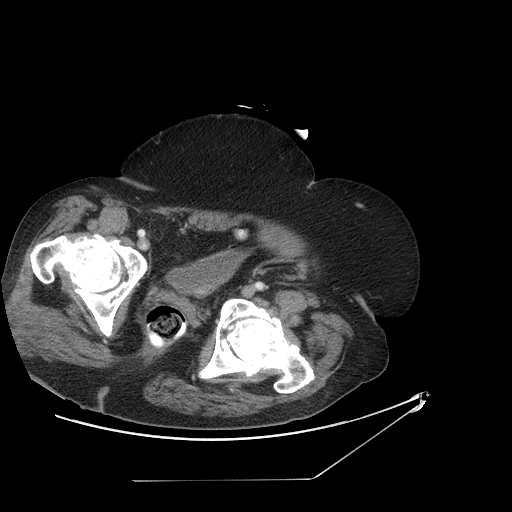
[im 29/98  soft-tissue]
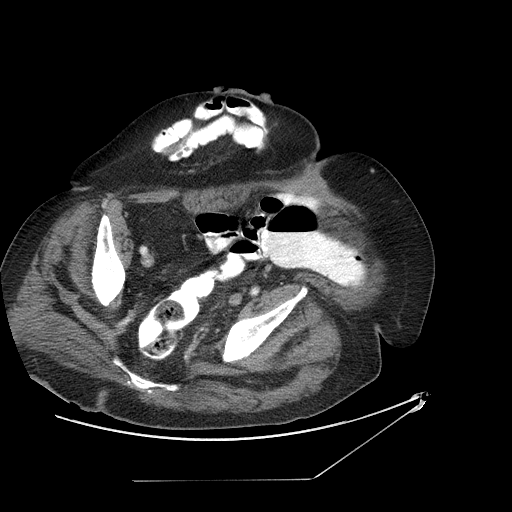
[im 33/98  soft-tissue]
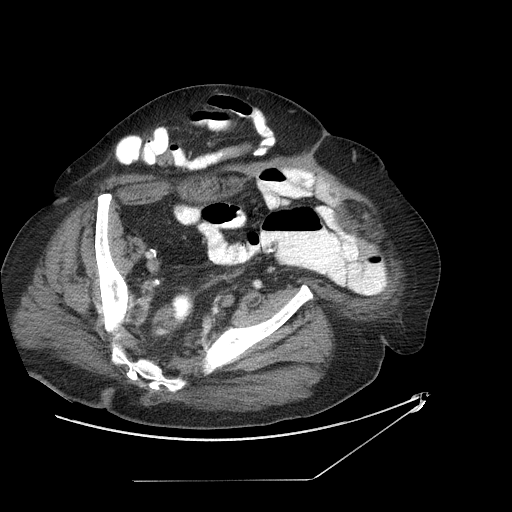
[im 41/98  soft-tissue]
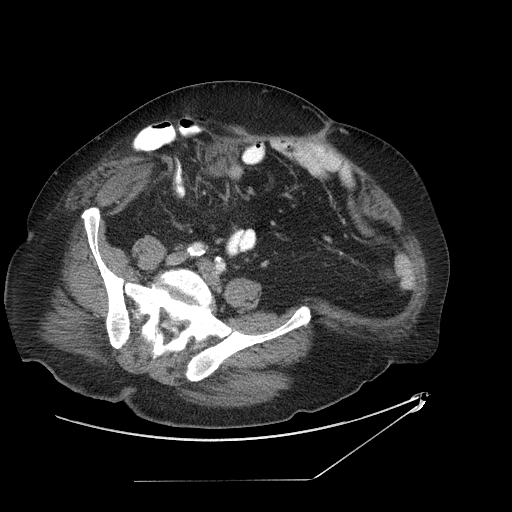
[im 49/98  soft-tissue]
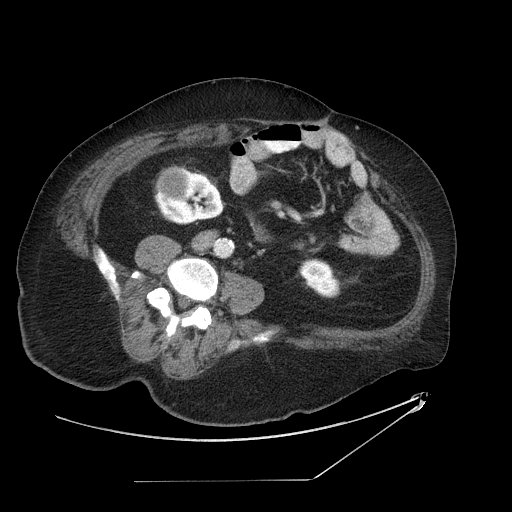
[im 57/98  soft-tissue]
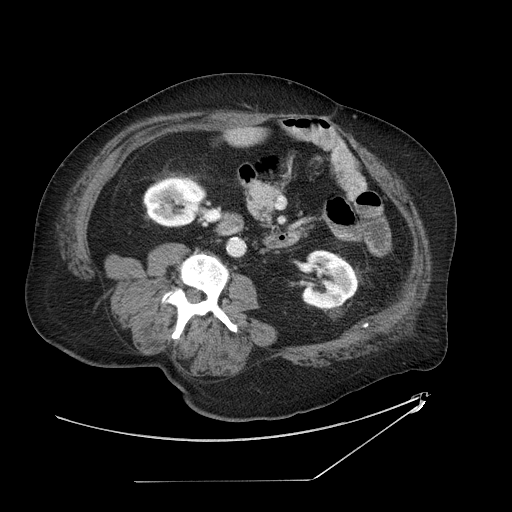
[im 65/98  soft-tissue]
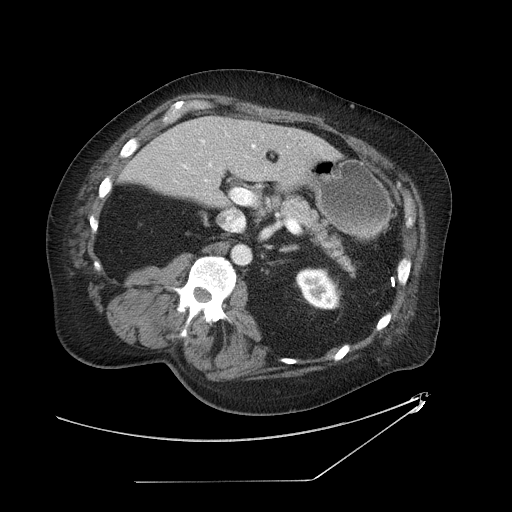
[im 65/98  bone]
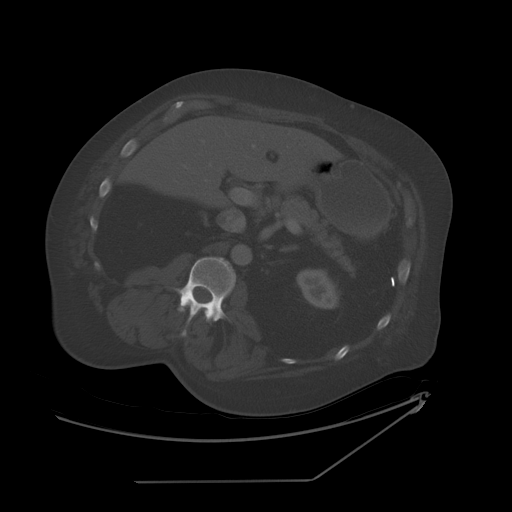
[im 69/98  soft-tissue]
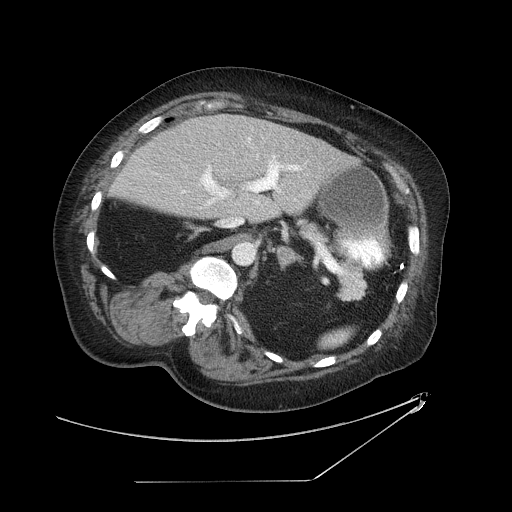
[im 77/98  soft-tissue]
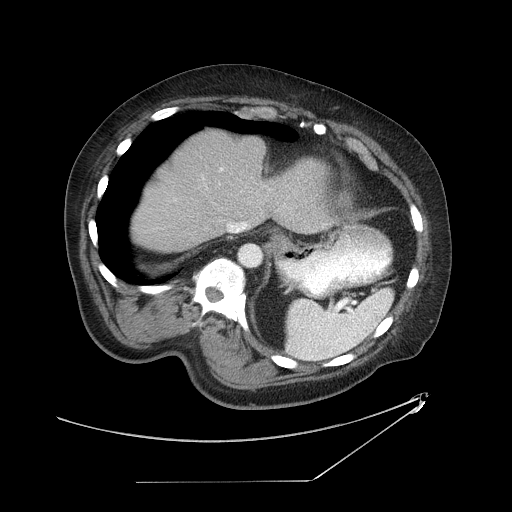
[im 85/98  soft-tissue]
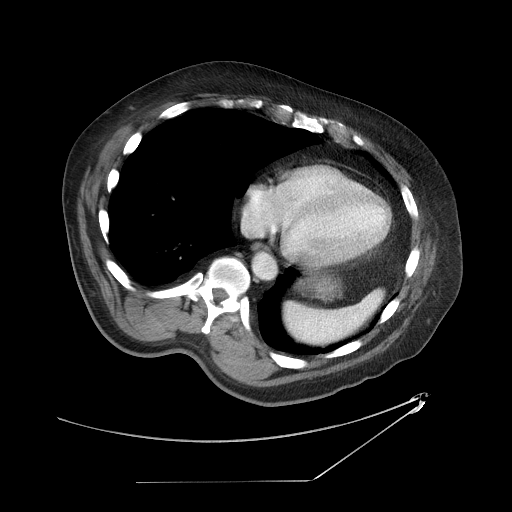
[im 93/98  soft-tissue]
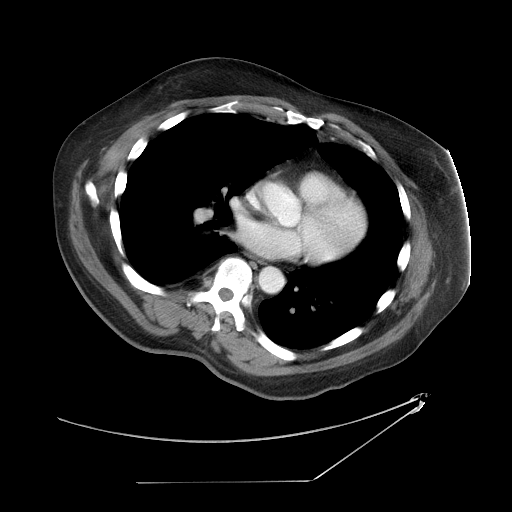

[Series 602: sagittal body · sagittal · 0.98mm/px · 3 of 185 slices shown]
[im 62/185  soft-tissue]
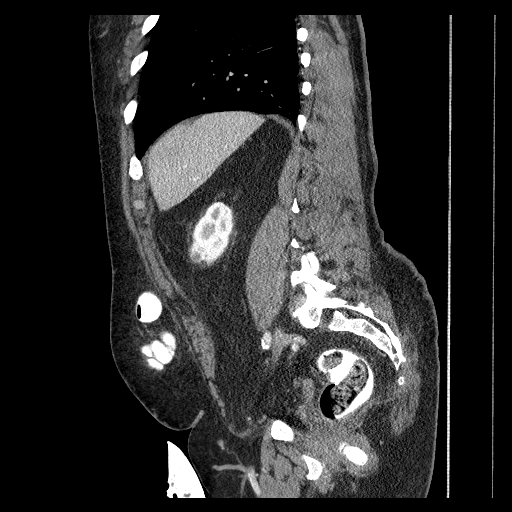
[im 82/185  soft-tissue]
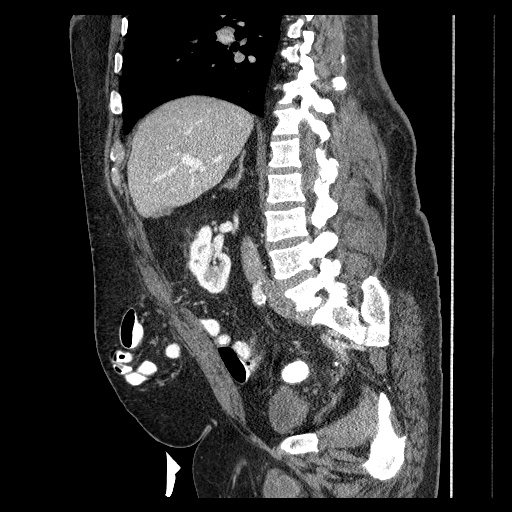
[im 103/185  soft-tissue]
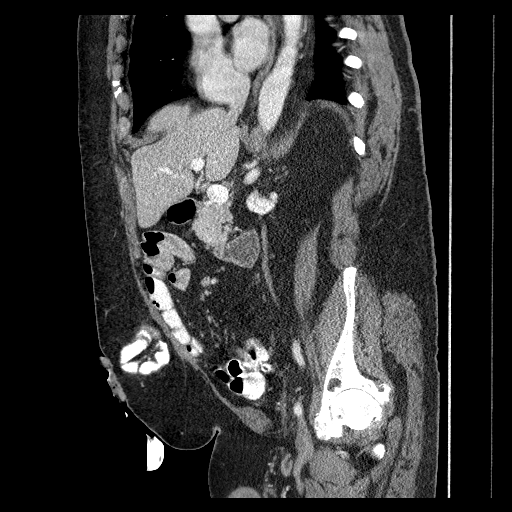

[16 of 46 positions shown; findings below may reference images not displayed]

FINDINGS: The liver, spleen, stomach, duodenum, pancreas,
gallbladder, and right adrenal gland are unremarkable.  1.8 cm left
adrenal nodule is stable in the 2-year interval since the prior
studies.

The patient has a 3.3 cm complex cystic lesion in the right kidney
which measured only about 1.5 cm in diameter on the previous exams.
This lesion has attenuation too high to be a simple cyst and
appears to have some areas of enhancing mural nodularity.
Enlarging cystic renal neoplasm is a concern.  A 2.0 cm water
density lesion in the left kidney measured about 1.8 cm previously
and has imaging features compatible with a cyst.

There is no intraperitoneal free fluid.  No abdominal
lymphadenopathy.  No abdominal aortic aneurysm.
IMPRESSION: Enlarging 3.3 cm complex cystic lesion with apparent enhancing
mural nodules.  This lesion has progressed since the prior study
when measured about 1.5 cm.  Cystic renal neoplasm is a concern.
MRI would be the study of choice to further evaluate if there is no
contraindication.

CT PELVIS
FINDINGS: The patient has a right lower quadrant ileostomy with
parastomal herniation of small bowel.  There is no evidence for
incarceration.

The patient is status post subtotal colectomy with a residual
Hartmann's pouch.  A suture line is visible at about the level of
the mid sigmoid colon.  The patient was given rectal contrast for
this study and although there is some tapering towards the suture
lines of the rectal stump, no definite stricture is evident.  There
is some distended small bowel in the left lower quadrant, and area
of suture material.  This is presumed to be an area of atony
related to an entero-enteric anastomosis.

There is no intraperitoneal free fluid.  No pelvic lymphadenopathy.
A right inguinal hernia contains only fat.

Advanced degenerative disease is seen in both hips.  No worrisome
lytic or sclerotic osseous lesions are identified.
IMPRESSION: Status post subtotal colectomy with a Hartmann's pouch.

Right lower quadrant ileostomy with a large parastomal hernia
containing small bowel loops without complicating features.

A right inguinal hernia contains only fat.

## 2009-11-11 IMAGING — CR DG ABDOMEN 1V
1 series · 1 of 1 positions shown · non-contrast
Comparison: CT 02/11/2008

CLINICAL DATA: Evaluate colostomy for takedown

ABDOMEN - 1 VIEW

[view not recorded]
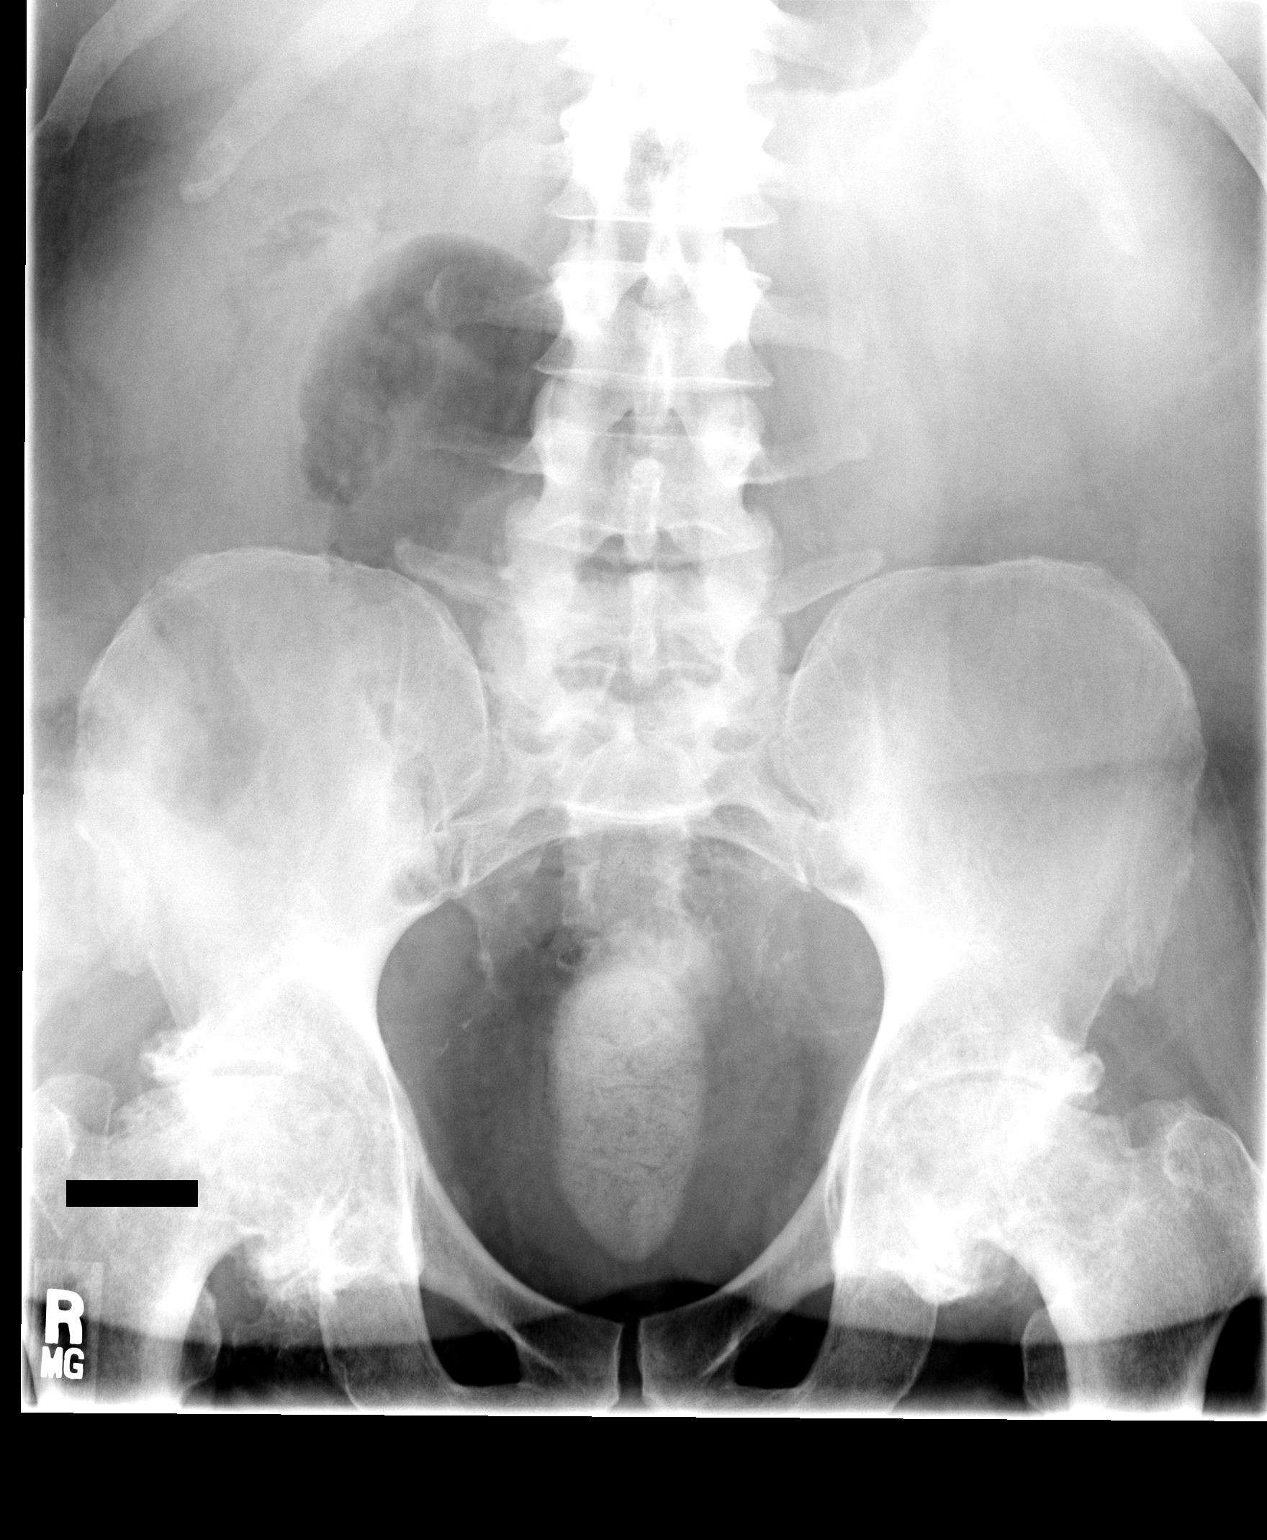

[1 of 1 positions shown; findings below may reference images not displayed]

FINDINGS: There is retained stool and barium within the
rectosigmoid colon, preventing colostomy through the rectum at this
time.  The patient will have further bowel cleansing and return for
colostomy at a later date.

There is a nonobstructive bowel gas pattern.

Severe degenerative changes within the hips bilaterally, with mild
femoral head flattening, possibly AVN changes.
IMPRESSION: Retained stool and barium within the rectosigmoid colon.  Further
bowel cleansing will be performed prior to barium enema.

## 2010-12-31 NOTE — Op Note (Signed)
[name], [name]            ACCOUNT NO.:  [id number]   MEDICAL RECORD NO.:  [id number]          PATIENT TYPE:  INP   LOCATION:  0158                         FACILITY:  [facility]   PHYSICIAN:  Zola Button T. Lazarus Salines, M.D. DATE OF BIRTH:  19-Jan-1946   DATE OF PROCEDURE:  11/04/2004  DATE OF DISCHARGE:                                 OPERATIVE REPORT   PREOPERATIVE DIAGNOSIS:  Prolonged intubation/ventilator-dependent  respiratory failure.   POSTOPERATIVE DIAGNOSIS:  Prolonged intubation/ventilator-dependent  respiratory failure.   PROCEDURE PERFORMED:  Tracheostomy.   SURGEON:  Dr. Lazarus Salines   ANESTHESIA:  General orotracheal.   BLOOD LOSS:  Minimal.   COMPLICATIONS:  None.   FINDINGS:  A fatty lower neck with a bulky thyroid isthmus.   DESCRIPTION OF PROCEDURE:  With the patient in a comfortable supine position  on his inflatable bed, anesthesia was induced for indwelling orotracheal  tube.  At an appropriate level, the patient was lifted and a pillow placed  under the shoulders to extend the neck which was adequately supported.  The  lower midline neck was palpated with the findings as described above.  Xylocaine 1% with 1:100,000 epinephrine 10 mL total was infiltrated into the  surgical field for intraoperative hemostasis.  Several minutes were allowed  for this to take effect.  A sterile preparation and draping of the low neck  was accomplished.   The sternal notch and thyroid notch were palpated.  Halfway between the  cricoid cartilage and the sternal notch, a 4 cm incision was marked and then  executed, carried down through skin, subcutaneous fat, and the superficial  layer of the deep cervical fascia.  Several small branches of anterior  jugular veins were controlled with silk ligature.  The midline raphe of the  strap muscles was identified and divided in two layers.  The thyroid isthmus  was identified.  The pretracheal plane was cleaned above and below the  thyroid  isthmus which was then cleared off of the trachea, isolated between  hemostats, divided, and controlled with 2-0 silk suture ligatures.  The  interior face of the trachea was further cleaned.  In the second and third  interspace, a transverse incision was used to enter the tracheal lumen.  A 5  mm wide inferiorly based single ring cartilaginous flap was sharply  executed.  The flap was secured to the lower wound with a 2-0 chromic  stitch.  The mucosal edges were cauterized for hemostasis.   At this point, looking directly into the tracheal lumen, the orotracheal  tube was backed out slightly, and a previously tested #8 Shiley tracheostomy  tube was placed without difficulty.  Ventilation was assumed per  tracheostomy tube.  The cuff was inflated and observed to be intact and  containing air.  Hemostasis was observed.  A Trach dressing was applied.  The Janina Mayo was secured with cotton twill ties in the standard fashion.  Again, hemostasis was observed.  At this point, the procedure was completed,  and the patient was returned to anesthesia, awakened, and transferred back  to the ICU in stable condition.   Comment:  A  65 year old black male status post perforated viscus requiring  hemicolectomy approximately 10 days ago and with a more recent wound  dehiscence with failure to wean from the ventilator thus far was the  indication for today's procedure.  Anticipated routine postoperative  recovery with attention to routine Trach care and clinical management per  general surgery and critical care medicine.      KTW/MEDQ  D:  11/04/2004  T:  11/04/2004  Job:  [id number]   cc:   Charlaine Dalton. Sherene Sires, M.D. [facility] M. Derrell Lolling, M.D.  [address]  [place]  [place] [address]

## 2010-12-31 NOTE — Op Note (Signed)
NAMELAVONTAY, KIRK            ACCOUNT NO.:  [id number]   MEDICAL RECORD NO.:  [id number]          PATIENT TYPE:  INP   LOCATION:  0158                         FACILITY:  [facility]   PHYSICIAN:  Angelia Mould. Derrell Lolling, M.D.DATE OF BIRTH:  April 26, 1946   DATE OF PROCEDURE:  11/02/2004  DATE OF DISCHARGE:                                 OPERATIVE REPORT   PREOPERATIVE DIAGNOSIS:  Abdominal wound dehiscence.   POSTOPERATIVE DIAGNOSIS:  Abdominal wound dehiscence.   OPERATION PERFORMED:  Debridement of subcutaneous tissue and fascia,  placement of VAC decompressive suction dressing.   SURGEON:  Angelia Mould. Derrell Lolling, M.D.   OPERATIVE INDICATIONS:  This is a 65 year old black man who was evaluated on  October 21, 2004 for abdominal distention and was found to have a  pneumoperitoneum.  He was taken to the operating room and was found to have  a perforation of the cecum with peritonitis, and this was thought to be due  to an inflammatory mass of the sigmoid colon.  There was a small abscess  there which was drained, thought to be diverticulitis.  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  He has multiple  comorbidities including respiratory failure, congestive heart failure,  relative adrenal insufficiency, hypertension, with tobacco abuse.  He has  not been able to be weaned from the ventilator.  This morning, they were  letting him wake up to see how well he would breathe, and he had some  coughing.  When I saw him on rounds this morning and examined the wound, I  found that his fascia was mostly dehisced, with small bowel visible in the  wound.  There was no omentum present, since that had been resected as part  of his right colectomy.  This was discussed with his daughter, and he was  returned to the operating room urgently.   OPERATIVE TECHNIQUE:  The patient was brought to the operating  room.  General anesthesia was induced.  I removed the dressings and examined the  wound.  There was some necrotic fascia here and there which was debrided.  The sutures were all removed.  The small bowel looked reasonably healthy,  and the abdomen was soft.  There was a lot of adherence of small bowel loops  to each other and to the abdominal wall, and I found that I could not safely  dissect the small bowel off of the abdominal wall without fear of tearing  the small bowel and creating multiple fistulas.  For that reason, I decided  not to try to close the tension sutures but to place a vacuum dressing.  The  wound was also seen by Dr. Maryagnes Amos and Dr. Luan Pulling, and they agreed that  this would will probably have to heal by secondary intention with  anticipated hernia.   We then took the old colostomy bags off and cleaned the skin.  We placed low-  profile bags as far away from the skin edge as possible and taped these in  place.  We then prepped with Betadine and draped the patient.  I irrigated  out the wound with lots of saline.  I found no abscesses.  I debrided some  more subcutaneous tissue and fascia.  There was a little bit of bleeding  which was controlled with cautery but not much.   I then placed the vacuum dressing.  A large elliptical fenestrated plastic  drape was placed in the center of the wound and at the base of the wound.  On top of that, I placed two layers of sponge.  I then placed Benzoin on the  skin all around and placed three sheets of plastic sheeting over this to  provide a seal.  I then made a small hole in the center of the plastic  sheeting down to the sponge.  I then placed the suction plate at the top of  this.  We had good adherence.  This was connected to the suction device at  125 mmHg.  We had an excellent seal, and this compressed down quite nicely.  The case was completed at this point.   ESTIMATED BLOOD LOSS:  About 15-20 cc.   COMPLICATIONS:   None.   SPONGE AND INSTRUMENT COUNTS:  Correct.      HMI/MEDQ  D:  11/02/2004  T:  11/02/2004  Job:  [id number]

## 2010-12-31 NOTE — Discharge Summary (Signed)
NAMEKENDRIC, SINDELAR            ACCOUNT NO.:  [id number]   MEDICAL RECORD NO.:  [id number]          PATIENT TYPE:  INP   LOCATION:  1406                         FACILITY:  [facility]   PHYSICIAN:  Casimiro Needle B. Sherene Sires, M.D. [facility] OF BIRTH:  1946-05-13   DATE OF PROCEDURE:  DATE OF DISCHARGE:  04/08/2005                      STAT - MUST CHANGE TO CORRECT WORK TYPE   FINAL DIAGNOSES:  1.  Perforation of right colon with peritonitis.  2.  Obstruction of left colon secondary to diverticulitis with diverticular      abscess.  3.  Postoperative wound dehiscence.  4.  Postoperative enterocutaneous fistula.  5.  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  6.  Nonsustained ventricular tachycardia.  7.  Chronic obstructive pulmonary disease.  8.  Congestive heart failure.  9.  Nonischemic cardiomyopathy.  10. Hypertension.  11. Obesity.  12. 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  13. History of tobacco abuse.  14. Large right hydrocele.  15. History of umbilical herniorrhaphy.  16. History of hospital-acquired pneumonia.  17. History of acute renal insufficiency.  18. Abnormal/elevated liver function tests.  19. Relative adrenal insufficiency.   PROCEDURES/OPERATIONS PERFORMED:  1.  Laparotomy with right colectomy, end-ileostomy, left transverse colon      mucus fistula, drainage of pelvic abscess, date October 21, 2004.  2.  Debridement of skin, subcutaneous tissue, and fascia of abdominal wall      and placement of decompression vacuum dressing, date November 02, 2004.  3.  CT-guided drainage of abscess of the right lateral peritoneal cavity,      date November 05, 2004.  4.  Tracheostomy, date November 04, 2004, removed on March 28, 2005.  5.  Cardiac catheterization revealing normal coronary arteries and normal LV      end-diastolic function and nonischemic cardiomyopathy, March 01, 2005.  6.  Exploratory laparotomy, resection of enteric cutaneous  fistula with      small bowel resection, extensive lysis of adhesions, left colectomy with      takedown of splenic flexure, repair of abdominal wall with 9 inch x 7      inch Bard colamine implant mesh, March 08, 2005.  7.  CT-guided drainage of left lower quadrant abscess, March 18, 2005.  8.  Left peripherally inserted central venous catheter, March 25, 2005,      discontinued April 08, 2005.   LABORATORY DATA/DIAGNOSTIC DATA:  April 06, 2005, sodium 131, potassium  4.4, chloride 104, CO2 21, BUN 25, creatinine 1.5, glucose 121.  March 18, 2005, abdominal drainage, culture positive MRSA, treated July 28 to August  11 with a course of Zyvox.  Urine culture, March 08, 2005, Pseudomonas and  Enterococcus, treated with both Zyvox and tobramycin, starting with  tobramycin on July 27 to August 2 and Zyvox from July 28 to August 6.   BRIEF HISTORY:  This is a 65 year old African-American gentleman, who  presented to [facility] on October 20, 2004, with chief  complaint of shortness of breath, decreased urine output/oliguria,  dehydration, lower abdominal pain, profound azotemia, with elevated BUN of  109 and creatinine of 10.2.  He was admitted  by the medical service and seen  promptly by the critical care medicine service and nephrology service and  underwent aggressive early goal-directed therapy in the intensive care with  volume resuscitation, IV vasoactive drips, and mechanical ventilation.  The  following morning, the patient's abdomen looked worse.  The CT scan showed  significant pneumoperitoneum but no focal source for a perforated viscus.  Dr. Derrell Lolling evaluated the patient.  He felt that he had acute peritonitis and  took the patient to the operating room for exploration.  He was found to  have a perforation of the right colon, presumably secondary to a mass in the  sigmoid colon.  At that point, the patient was in florid septic shock.  He  did a right colectomy and  ileostomy and left transverse colon mucus fistula.  He had a small abscess around the distal sigmoid colon mass which was  drained, but it was not felt that the patient was stable enough to resect  the colon at that time.  He was returned to the intensive care on  ventilatory support.  The patient was still quite unstable in the immediate  postoperative phase requiring aggressive resuscitation.  On November 01, 2004,  the weaning process had begun.  He was allowed to awaken somewhat; however,  on March 21, he was found to have abdominal wall dehiscence and small bowel  in the wound.  He was returned to the operating room promptly, found to have  necrotizing skin and subcutaneous tissue which was debrided at that point,  and the patient's small bowel was edematous and densely adherent to the  peritoneal surface, and it did not appear to be safe at that time to be  dissected.  Placed a vacuum dressing on that during the time period.  On  March 23, he underwent tracheostomy by Flo Shanks, MD, which was  uneventful.  On March 24, he had a CT scan which showed an abscess of the  right lateral peritoneal cavity, and this was drained percutaneously by the  radiology department, and that was successful.  Antibiotics were adjusted  appropriately.  Shortly thereafter, he developed drainage from the abdominal  wound, consistent with small bowel content.  He was examined again by Dr.  Derrell Lolling which showed that in fact he did have a defect in the small bowel in  the upper part of the wound.  This was managed with drainage bag fashioned  all the way around the wound to control drainage.  He drained a large amount  initially; he was placed on Sandostatin.  This controlled the drainage  somewhat.  Over the next several weeks and few months, the patient actually  stabilized.  His hospitalization was complicated by several rounds of  hospital-acquired pneumonia and prolonged ventilator-dependent  respiratory failure as well as recurrent bouts of systemic inflammatory response  syndrome and severe sepsis/septic shock.  The patient has experienced  multiple resistant hospital-acquired infections, one of which being highly  resistant Pseudomonas in both the form of a hospital-acquired pneumonia as  well as urinary tract infection.  By laboratory data and sensitivity data,  this organism has been resistant to most antibiotics with the exception of  aminoglycosides in the form of tobramycin.  Last time the Pseudomonas was  cultured was on March 09, 2005, by urine.  The patient has also had MRSA in  both sputum and then last from abdominal drain on March 18, 2005.  This was  treated successfully with round of Zyvox  from July 28 to August 11.  The  patient also has history of Enterococcus in the urine treated successfully  once again with Zyvox on July 28 to August 6.  The patient's medical status  continued to improve.  After a long period of ventilator dependence, he was  successfully weaned from mechanical ventilation and transferred to the  medical ward.  He had aggressive wound care; the wound continued to get  smaller; his pulmonary status continued to improve.  In mid July, it was  felt that he was stabilized from a cardiac and pulmonary standpoint, and  plans were made to bring him back to the operating room to restore his  gastrointestinal tract.  He was seen by physical therapy and because of his  extreme disability from bilateral hip disease, they felt that his best  future goal would be able to transfer from bed to a wheelchair, and that he  probably would not be able to do anything further than that.  Because of  that, Dr. Derrell Lolling did not feel that it would be warranted to performed  anastomosis for fear that he would develop skin breakdown from stooling.  He  was advised that to have the surgery, he would have to have the entire left  colon resected because of the mass in his  sigmoid colon.  He had a  colonoscopy and a Gastrografin enema.  Both of these showed complete  obstruction of the mid sigmoid level.  Biopsies simply showed colonic  inflammation.  There was no sign of malignancy.  He had a small bowel series  with prograde through his Panda and retrograde through his ileostomy,  demonstrating a fistula in the upper small bowel but no obstruction  elsewhere.  He underwent a cardiac catheterization by [facility], which showed normal coronary arteries, normal left ventricular end-  diastolic pressure, and they felt that he did have nonischemic  cardiomyopathy.  The patient did go back to the operating room on March 08, 2005.  At that time, Dr. Derrell Lolling resected the chronically inflamed tissue of  his abdominal wall, including the Enterococcus fistula, into the abdominal  cavity, resected involving small bowel, and performed a small bowel  anastomosis.  He then took down the extensive adhesions, taking about 1 hour and explored the entire abdomen and pelvis cavities.  Took down the left  transverse colon mucus fistula and resected the entire colon and the  inflammatory mass in the distal sigmoid, repaired the abdominal wall with a  7 inch piece of colamine porcine biologic mesh, and the fascia was then  closed.  Postoperatively, Mr. Surgeon initially did require again  prolonged mechanical ventilation over the course of approximately 1-1/2 to 2  weeks, primarily owing to his severely debilitated state.  He was eventually  weaned off from the mechanical ventilator, again having recurrent bouts of  fever, hypotension, and systemic inflammatory response syndrome  symptomatology, again treated with aggressive IV fluid resuscitation and  vasoactive drips.  The patient stabilized hemodynamically over the course of  approximately 4 weeks; however, he did develop some purulence of the mucus  fistula site in the left mid abdomen, which had to be  opened up and  initially treated with a VAC dressing; then it began to granulate, and then  now it is being changed with twice a day wet-to-dry dressings.  His midline  abdominal wound healed without complication.  His drains were removed.  He  did develop a left lower  quadrant and upper pelvic abscess which was drained  successfully by radiology department.  Final CT showing that the abscess had  completely resolved.  There were no other signs of abscess or infection  within the abdomen, and the abdominal drain was pulled.  It was felt by Dr.  Sherene Sires that his pulmonary status was stable.  He was decannulated on March 28, 2005.  His tracheostomy stoma has healed up without any complications.  The patient's voice is normal.  He is swallowing normally.  His Panda tube  feedings have been discontinued, and he is now on an oral diet and  tolerating this well.  At the time, additional problems include renal  insufficiency; the patient has a baseline renal function of a creatinine of  1.1.  He has had recurrent exacerbations of acute renal failure, primarily  responsive to aggressive volume resuscitation and maximization of cardiac  output.  Upon the time of discharge, his creatinine is 1.4 and stable.   Nonsustained ventricular tachycardia, initially worked up by [facility].  The patient will be sent to the nursing facility on oral  amiodarone.  Abnormal LFTs worked up extensively by [facility], felt to be  primarily at baseline and secondary to TNA supplementation.   Relative adrenal insufficiency.  Noted specifically during severe bouts of  hypotension and shock.  Supplemented twice during his acute illness with 7  days of shock dose Solu-Cortef.   DISCHARGE MEDICATIONS:  1.  Protonix 40 mg p.o. daily.  2.  Amiodarone 200 mg p.o. daily.  3.  Lopressor 12.5 mg p.o. b.i.d.  4.  Sliding-scale insulin, CBG 60-100 = 0 units; CBG 121-150 = 2 units; CBG      151-200 = 4 units; CBG  201-250 = 8 units; 251-300 = 12 units; 301-350 =      16units. 5.  Multivitamin once daily.  6.  Tylenol p.r.n.  7.  Senokot-S p.r.n.  8.  Nutritional supplements as needed.   ACTIVITY:  Up to chair with physical therapy.   DISPOSITION:  At time of medical discharge, the patient is afebrile.  His  heart rate is 84-100, respirations in the 20s, blood pressure 118/76,  saturations 98% on room air.  His exam is stable.  Breath sounds clear and  diminished in  the bases.  Abdominal wound dressing intact.  He is currently ready for  discharge.  Wound care as outlined per Dr. Derrell Lolling with twice day wet-to-dry  saline dressing changes.  Follow up with Dr. Derrell Lolling in 1 month.  No need to  follow up with Dr. Sherene Sires.      Anders Simmonds, N.P. [facility]    ______________________________  Charlaine Dalton. Sherene Sires, M.D. [facility]    PB/MEDQ  D:  04/08/2005  T:  04/08/2005  Job:  [id number]   cc:   Charlaine Dalton. Sherene Sires, M.D. [facility]  [address]  [place]  [place] [address]   Jesse Sans. Wall, M.D.  [address]  [address]  [place]  [place] [address]   Gloris Manchester. Lazarus Salines, M.D.  [address] [place]  [place] [address]  Fax: [phone]   Angelia Mould. Derrell Lolling, M.D.  [address], [address]  [place]  [place] [address]   Mindi Slicker. Lowell Guitar, M.D.  [address]  [place]  [place] [address]  Fax: [phone]   Dr. Arty Baumgartner

## 2010-12-31 NOTE — Op Note (Signed)
[name], [name]            ACCOUNT NO.:  [id number]   MEDICAL RECORD NO.:  [id number]          PATIENT TYPE:  INP   LOCATION:  0153                         FACILITY:  [facility]   PHYSICIAN:  Angelia Mould. Derrell Lolling, M.D.DATE OF BIRTH:  [birth year]   DATE OF PROCEDURE:  10/21/2004  DATE OF DISCHARGE:                                 OPERATIVE REPORT   PREOPERATIVE DIAGNOSIS:  Perforated viscus.   POSTOPERATIVE DIAGNOSIS:  Sigmoid colon obstruction secondary to  diverticulitis with diverticular abscess, perforation of cecum.   OPERATION PERFORMED:  1.  Exploratory laparotomy.  2.  Extended right colectomy.  3.  End ileostomy.  4.  Left transverse colon mucous fistula.  5.  Drainage of pelvic abscess.   SURGEON:  Dr. Claud Kelp.   FIRST ASSISTANT:  Dr. Ovidio Kin   OPERATIVE INDICATIONS:  This is a 65 year old black man, who has multiple  medical problems and was admitted to this hospital yesterday with a 1-week  history of abdominal pain, malaise and constipation.  He did admit to seeing  a little bit of blood in his stools from time to time.  He was found to be  in acute renal failure and has been receiving resuscitation overnight.  A CT  scan performed today showed that he had a significant pneumoperitoneum,  although the CT did not determine the exact location or cause of the  pneumoperitoneum.  I was called to see the patient after the CT was done.  He is brought to the operating room emergently.   OPERATIVE FINDINGS:  The patient had a perforation of the cecum, and it was  partially walled-off in a small abscess with the right transverse colon and  the omentum.  This was due to what is presumed to be diverticulitis in the  sigmoid colon.  The sigmoid colon had a firm, rubbery mass in it with an  abscess superolateral to this which was drained.  The rectum distal to this  was collapsed.  I could not be sure this was not a cancer, but it felt more  like diverticulitis  than anything.  There was not a lot of fecal soilage at  the beginning of the case, just some exudate, but there was fecal spillage  during the conduct of the operation.  The liver felt normal.  The stomach  looked normal.  The small bowel was a little bit edematous.  No other  grossly abnormal findings were present.   OPERATIVE TECHNIQUE:  Following the induction of general endotracheal  anesthesia, the patient's abdomen was prepped and draped in a sterile  fashion.  A midline laparotomy was performed, and the abdomen was entered  and explored with findings as described above.  I identified the perforation  in the cecum, and dit began to leak stool.  I clamped this off and oversewed  it with 2-0 silk sutures, but I had spilled about 300-400 mL of stool by  that time.  We irrigated this out.  I then placed a pursestring suture in  the cecum and opened it up and placed a pool sucker into the cecum and  evacuated all of the liquid and gas contents from the colon.  I milked the  colon contents retrograde to evacuate this.  I also milked a lot of the  small bowel content prograde into the cecum and sucked this out.  I then  removed the sucker and discarded it and then closed the cecum with 2-0 silk  sutures.   I transected the terminal ileum with a GIA stapling device.  I mobilized the  right colon and the hepatic flexure by dividing the lateral peritoneal  attachments.  I did this very carefully because the colon was so edematous  and boggy.  I clearly identified the duodenum and preserved it quite nicely.  I took down the mesentery of the terminal ileum, the right colon, and the  right transverse colon very close to the colon wall.  This was done between  clamps, and vessels were either ligated or suture ligated with 2-0 silk  ties.  Larger vessels were doubly ligated.  I chose to take the resection  over to the midline because the right transverse colon appeared to have been  involved in  an abscess, although the transverse colon did not appear to be  the source of perforation.  I then transected the mid transverse colon  distal to the abscess with the GIA stapling device and removed the specimen.   We then further examined the splenic flexure and left colon and sigmoid  colon and confirmed that there was an inflammatory mass in the sigmoid colon  with an abscess superolateral to this.  I was able to drain that abscess  without much difficulty.   At this point in the case, the patient was somewhat hemodynamically  unstable, presumably due to septic shock and third spacing fluid and volume  depletion.  Anesthesia personnel were working fairly hard, had been giving  multiple liters of saline as well as vasopressors with multiple lines being  placed, and we felt that we should conclud the operation as expeditiously as  possible.  Dr. Ezzard Standing and I discussed this, and it was felt that we would  simply divert the patient and drain the pelvic abscess in hopes that the  patient could recover, and we could study the sigmoid colon later and resect  that at a later date.   We brought out an ileostomy in the right upper quadrant through the rectus  sheath.  We brought out left transverse colon and the mucous fistula in the  left upper quadrant through the rectus sheath.  We tacked the small bowel to  the peritoneum with a couple of interrupted sutures of 3-0 Vicryl.   During the case, we probably irrigated the abdomen with about 14-15 L of  saline.  We checked for bleeding at this point.  There did not appear to be  any bleeding.  There was no gross soilage or exudate left.  We felt that we  had irrigated as best as we could.  We positioned the nasogastric tube.  We  placed a 19-French Blake drain in the left lower quadrant along the sigmoid  colon and on the abscess cavity and sutured that to the skin with nylon  suture and connected that to a suction bulb.  The midline fascia  was closed with a running suture of #1 PDS and about 10  interrupted sutures of #1 Novofil.  Because of the contamination, we chose  to pack the skin open with Kerlix.  The ileostomy was matured to the dermis  with interrupted sutures of 3-0 Vicryl.  The mucous fistula was matured to  the skin and the dermis on the left side with interrupted sutures of 3-0  Vicryl.  Both of these stomas looked perfectly viable and had bled well at  the end of the case.  Stoma bags were placed.  Clean bandages were placed.  The patient tolerated the procedure fairly well and at the end of the case,  his blood pressure was up to 107 systolic.  Dr. Sherrian Fox has already  placed a radial arterial line and plans to place a central venous catheter.   Estimated blood loss was about 400-500 mL.  Complications none.  Sponge,  needle, and instrument counts were correct.      HMI/MEDQ  D:  10/21/2004  T:  10/21/2004  Job:  [id number]   cc:   Fleet Contras, M.D.  [address]  [place]  [place] [address]  Fax: [phone]   Oley Balm. Sung Amabile, M.D. [facility]   Richard F. Caryn Section, M.D.  [address]  [place]  [place] [address]  Fax: [phone]

## 2010-12-31 NOTE — Cardiovascular Report (Signed)
NAMEBEDFORD, WINSOR            ACCOUNT NO.:  [id number]   MEDICAL RECORD NO.:  [id number]          PATIENT TYPE:  OUT   LOCATION:  CATH                         FACILITY:  [facility]   PHYSICIAN:  Salvadore Farber, M.D. LHCDATE OF BIRTH:  23-Mar-1946   DATE OF PROCEDURE:  03/01/2005  DATE OF DISCHARGE:                              CARDIAC CATHETERIZATION   PROCEDURE:  Left heart catheterization, coronary angiography.   INDICATIONS FOR PROCEDURE:  Mr. Westberg is a 65 year old gentleman who has  been hospitalized for greater than four months with dehiscence of an  abdominal wound and sepsis.  Over the course of that time, his ejection  fraction has fallen from 60% to 26% confirmed by both echocardiogram and  Cardiolite imaging.  He is now referred for diagnostic angiography to  exclude coronary disease as the etiology of his worsening left ventricular  function.   PROCEDURE TECHNIQUE:  Under 1% lidocaine local anesthesia, a 5 French sheath  was placed in the right brachial artery using the modified Seldinger  technique.  Left heart catheterization was performed using Amplatz catheter.  Angiography of the right system was performed using AL1 catheter.  Angiography of the left system was performed using JL4 catheter.  The  catheters were then removed over a wire.  The sheath was then removed and  manual compression applied.  Complete hemostasis was obtained.   COMPLICATIONS:  None.   FINDINGS:  1.  Left ventricle:  118/9/11.  2.  Left main:  Angiographically normal.  3.  LAD:  Moderate size vessel giving rise to a single diagonal,      angiographically normal.  4.  Ramus intermedius:  There are two parallel ramus vessels, both are      angiographically normal.  5.  Circumflex:  Moderate size vessel giving rise to three obtuse marginals,      angiographically normal.  6.  Right coronary artery:  Moderate size dominant vessel.  Angiographically      normal.   IMPRESSION/RECOMMENDATIONS:  Angiographically normal coronary arteries with  normal left ventricular end diastolic pressure.  The patient has a  nonischemic cardiomyopathy.       WED/MEDQ  D:  03/01/2005  T:  03/01/2005  Job:  [id number]   cc:   Charlaine Dalton. Sherene Sires, M.D. [facility]

## 2010-12-31 NOTE — Consult Note (Signed)
NAMETANNER, YELEY            ACCOUNT NO.:  [id number]   MEDICAL RECORD NO.:  [id number]          PATIENT TYPE:  INP   LOCATION:  0158                         FACILITY:  [facility]   PHYSICIAN:  Zola Button T. Lazarus Salines, M.D. DATE OF BIRTH:  09-20-1945   DATE OF CONSULTATION:  03/09/2005  DATE OF DISCHARGE:                                   CONSULTATION   CHIEF COMPLAINT:  Ventilator leak.   HISTORY OF PRESENT ILLNESS:  The patient is a 65 year old black male with a  long, complex hospitalization related to intra-abdominal sepsis. He had a  tracheostomy tube placed somewhere in the early spring.  He was progressing  towards ventilation spontaneously and, in fact, was tolerating the trach  plugged and then he required additional surgery. Subsequently, anesthesia  was only able to place a #4 cuffed Shiley tracheostomy tube.  Even with high  cuff pressure, he still has a substantial ventilator leak.  This is allowing  him to voice, but precluding a satisfactory ventilation.  ENT is called in  consultation for assistance with upsizing his tracheotomy tube.   PHYSICAL EXAMINATION:  GENERAL:  He appears alert and appropriate.  He has a  large abdominal binder in place.  He has a #4 cuffed Shiley tracheostomy  tube in place with high cuff pressure and still substantial leak and a  reasonably good voice quality.  The trach tube is mildly soiled.   IMPRESSION:  Insufficient trach tube size.   PLAN:  With informed consent and respiratory therapy and nursing in  attendance, I removed his #4 Shiley tracheostomy tube and in rapid  succession, dilated the tract with a #5, #6, #7, and #8 metal Jackson  tracheostomy tube.  He was ventilating spontaneously through these  progressions.  Finally, after leaving the #8 in place for several minutes,  because it had been a relatively tight fit, I removed this and was able to  easily place a  #6 cuffed Shiley tracheostomy tube.  The cuff was inflated  and  observed to be intact and containing air.  The inner cannula was placed  and the patient resumed ventilation.  The cuff pressure was adjusted so  there was no cuff leak.   Following placement of the tube, the flexible laryngoscope was introduced  through the tracheostomy tube and the tube tip was observed to be well  within it lumen of the trachea.  The patient tolerated the entire procedure  nicely.   I will assist further as required.       KTW/MEDQ  D:  03/09/2005  T:  03/10/2005  Job:  [id number]   cc:   Charlaine Dalton. Sherene Sires, M.D. [facility] M. Derrell Lolling, M.D.  [address]  [place]  [place] [address]

## 2010-12-31 NOTE — Consult Note (Signed)
[name], [name]            ACCOUNT NO.:  [id number]   MEDICAL RECORD NO.:  [id number]          PATIENT TYPE:  EMS   LOCATION:  ED                           FACILITY:  [facility]   PHYSICIAN:  Mindi Slicker. Lowell Guitar, M.D.  DATE OF BIRTH:  1946-05-07   DATE OF CONSULTATION:  10/20/2004  DATE OF DISCHARGE:                                   CONSULTATION   PRIMARY CARE PHYSICIAN:  Fleet Contras, M.D.   I was asked by Dr. Concepcion Elk to see this 65 year old male who presents with a  several-day history of increased shortness of breath and markedly diminished  urine output, urgency, and lower abdominal pain.  He presented to the  emergency room today and was found to have a BUN of 109 and creatinine of  10.1.  There are no [facility] records available regarding prior  hospitalizations.  Reported serum creatinine was 1.5 mg/dL in May of 1308.  Foley catheter was placed with a small amount of urine output.   CURRENT MEDICATIONS:  1.  Darvocet-N 100.  2.  Furosemide 80 mg per day.  3.  Coreg 25 mg b.i.d.  4.  Digoxin 0.25 mg per day.  5.  Norvasc 10 mg daily.  6.  Lisinopril 20 mg daily.  7.  Potassium chloride 20 mEq daily.   PAST MEDICAL HISTORY:  Per patient and family:  Hypertension, congestive  heart failure followed by Meade Maw, M.D., obesity, prior history of  hernia operation.   SOCIAL HISTORY:  He is a prior smoker and current smoker.  Prior heavy  alcohol consumer in the past, occasionally now.   ALLERGIES:  No known drug allergies.   FAMILY HISTORY:  Negative for kidney disease.  Father for heart disease and  hypertension.   REVIEW OF SYSTEMS:  As mentioned above, recent worsening shortness of  breath, decreased urine output, longstanding history of hesitancy, nocturia,  and urgency with decreased volume of urination that has worsened over the  past several months.   PHYSICAL EXAMINATION:  VITAL SIGNS:  Blood pressure 80/42, heart rate 92.  GENERAL:  Acutely ill  African-American male.  He is obese.  HEENT:  Neck veins are not distended when he is in the supine position.  LUNGS:  Decreased breath sounds, otherwise clear.  HEART:  Regular rate and rhythm.  No pericardial friction rub.  ABDOMEN:  Distended and slightly tender in the suprapubic area.  Slightly  tympanitic.  EXTREMITIES:  Trace edema bilaterally.   LABORATORY DATA:  Arterial blood gas; pO2 65, pCO2 29, pH 7.39.  Sodium 133,  potassium 4.1, chloride 88, CO2 31, BUN 109, creatinine 10.1, albumin 2.0,  bilirubin 1.4.  White blood cell count 6100, platelets 225,000.  Hemoglobin  12 grams.   Chest x-ray; increased heart size and decreased lung volumes.   ASSESSMENT:  Probable acute renal failure, etiology is most likely  hemodynamically mediated secondary to hypotension, ACE inhibitor, decreased  cardiac output secondary to cardiomyopathy, but there is a very strong  possibility of obstruction due to prostatic enlargement given his chronic  symptoms.   RECOMMENDATIONS:  1.  Volume  expand.  Hold blood pressure and medicines, ACE inhibitor and      Digoxin.  Would support blood pressure with pressors as needed.  Check      digoxin level.  2.  Obtain a renal ultrasound.  3.  Pan culture and empiric antibiotics to rule out sepsis.  4.  Dialysis as needed.  5.  Need prior database since nothing is in e-chart.  6.  Obtain cardiac echo.  Would consider obtaining cardiac echo to rule out      tamponade/constriction.  7.  Would consider a V/Q scan given hypotension and shortness of breath.      ACP/MEDQ  D:  10/20/2004  T:  10/21/2004  Job:  [id number]

## 2010-12-31 NOTE — Consult Note (Signed)
NAMEJARIS, KOHLES            ACCOUNT NO.:  [id number]   MEDICAL RECORD NO.:  [id number]          PATIENT TYPE:  INP   LOCATION:  0153                         FACILITY:  [facility]   PHYSICIAN:  Angelia Mould. Derrell Lolling, M.D.DATE OF BIRTH:  September 28, 1945   DATE OF CONSULTATION:  10/21/2004  DATE OF DISCHARGE:                                   CONSULTATION   REASON FOR CONSULTATION:  Evaluate pneumoperitoneum.   HISTORY OF PRESENT ILLNESS:  This is a 65 year old black male with a past  history of congestive heart failure, hypertension, COPD, and a longstanding  right hydrocele. He has also had an umbilical herniorrhaphy in the past. He  was admitted 24 hours ago with a 1-week history of malaise, poor appetite,  constipation, and abdominal pain. He does report that he has seen some blood  in his stools. He was found to have respiratory insufficiency but has not  required intubation at this time. He was found to have acute renal failure  with a creatinine approximately 9.0 and a BUN approximately 115. A CT scan  today was obtained and this showed a significant pneumoperitoneum. The colon  was distended but there was no focal mass or adenopathy. There was no focal  inflammatory process. The cecum was quite distended, however. A large right  hydrocele was noted. I was called to see him at that point.   PAST HISTORY:  1.  COPD and tobacco abuse.  2.  Congestive heart failure, under the care of Dr. Meade Maw.  3.  Hypertension.  4.  Arthritis of the hips.  5.  Umbilical herniorrhaphy.   CURRENT MEDICATIONS:  1.  K-Dur 20 mEq daily.  2.  Lasix 40 mg twice daily.  3.  Digoxin 0.25 mg daily.  4.  Darvocet-N 100 q.6h. p.r.n. pain.  5.  Combivent inhaler q.6h. p.r.n.  6.  Albuterol nebulizers q.6h. p.r.n.  7.  Coreg 25 mg b.i.d.  8.  Verapamil SR 180 mg daily.  9.  Aspirin 325 mg daily.  10. Norvasc 10 mg daily.  11. Lisinopril 20 mg daily.   DRUG ALLERGIES:  None known.   SOCIAL  HISTORY:  He smokes one pack of cigarettes per day. Denies the use of  alcohol. He lives with family members.   FAMILY HISTORY:  Negative for kidney disease. Father positive for heart  disease and hypertension.   REVIEW OF SYSTEMS:  All systems are reviewed and are noncontributory except  as described above.   PHYSICAL EXAMINATION:  GENERAL:  Alert, appropriate, oriented black man with  a BiPAP mask on. He is in moderate distress but is cooperative and coherent.  VITAL SIGNS:  Blood pressure 110/50, heart rate 60 and regular, respiratory  rate 28, temperature 97.0, oxygen saturation 88% on BiPAP.  NECK:  Neck veins are not distended with no neck mass.  LUNGS:  Decreased breath sounds, no wheezing.  HEART:  Regular rate and rhythm.  ABDOMEN:  Markedly distended. Silent. Well-healed transverse scar below the  umbilicus. No palpable mass. He is fairly significantly tender on the right  side, right lower quadrant, and right upper quadrant,  and much less tender  on the left side.  GENITOURINARY:  He has a huge right scrotal hydrocele that is not  particularly tender.  RECTAL:  Exam not performed.  EXTREMITIES:  Trace edema bilaterally.   ADMISSION DATA:  CT scan as mentioned above that shows pneumoperitoneum and  distended cecum and colon but no focal mass or inflammatory process.  Hemoglobin is 12.0, white count 6400. Creatinine 9.1, BUN 115, potassium of  4.0, amylase 48, lipase 23. Liver function tests normal. TSH is 0.226 which  is slightly low. PT and PTT are essentially normal, PT being 13.2 with an  INR of 1.0 and PTT being 35.   ASSESSMENT:  1.  Perforated viscus. I am concerned that he has a perforation of his cecum      or other part of his colon. This might be due to a distal obstruction      from cancer or due to a pseudoobstruction.  2.  Right hydrocele, asymptomatic.  3.  Congestive heart failure.  4.  Acute renal failure.  5.  Chronic obstructive pulmonary  disease.   PLAN:  1.  The patient will be taken to the operating room for emergent exploratory      laparotomy, possible bowel resection with colostomy.  2.  I suspect that he will need to be mechanically ventilated      postoperatively.  3.  He is at high risk for postoperative complications such as pneumonia,      DVT, renal failure requiring dialysis, and congestive heart failure.   I have discussed the indications and details of surgery with the patient and  with his sister, Reyne Dumas. Risks and complications have been outlined,  including but not limited to bleeding; infection; wound problems such as a  hernia;  reoperation for complications; injury to adjacent organs; possibility of  cancer diagnosis; cardiac, pulmonary, and thromboembolic problems. They both  seem to understand these issues well. At this time all of their questions  are answered. They both agree with this plan.      HMI/MEDQ  D:  10/21/2004  T:  10/21/2004  Job:  [id number]   cc:   Oley Balm. Sung Amabile, M.D. [facility]   Meade Maw, M.D.  [address]  [place]  [place] [address]  Fax: [phone]   Wilber Bihari. Caryn Section, M.D.  [address]  [place]  [place] [address]  Fax: [phone]

## 2010-12-31 NOTE — Consult Note (Signed)
[name], [name]            ACCOUNT NO.:  [id number]   MEDICAL RECORD NO.:  [id number]          PATIENT TYPE:  INP   LOCATION:  1412                         FACILITY:  [facility]   PHYSICIAN:  Jesse Sans. Wall, M.D.   DATE OF BIRTH:  1945-09-09   DATE OF CONSULTATION:  02/17/2005  DATE OF DISCHARGE:                                   CONSULTATION   REFERRING PHYSICIAN:  Casimiro Needle B. Sherene Sires, M.D.   PATIENT PROFILE:  A 65 year old African American male with prolonged  hospitalization admitted October 20, 2004, with shortness of breath and acute  renal failure who we are now seeing for nonsustained VT.   PROBLEMS:  1.  Congestive heart failure.  2.  Nonsustained ventricular tachycardia.  3.  Hypertension.  4.  Acute renal failure.  5.  Obesity.  6.  Tobacco abuse.  7.  Perforated cecum secondary to sigmoid obstruction secondary to pelvic      abscess. Status post partial colectomy, ileostomy and distal mucous      fistula colostomy October 21, 2004.  8.  Methicillin resistant Staphylococcus aureus bacteremia.  9.  Prolonged intubation, status post tracheostomy November 04, 2004.  10. Diabetes.  11. Sigmoid stricture with obstruction.  12. Right hydrocele.   HISTORY OF PRESENT ILLNESS:  A 65 year old African American male who  presented October 20, 2004, with increased shortness of breath and acute renal  failure, BUN 11.9, creatinine 7.1, and abdominal pain with CT of the abdomen  showing pneumoperitoneum.  He was taken to the OR for exploratory laparotomy  on October 21, 2004, revealing perforated cecum secondary to sigmoid  obstruction and pelvic abscess.  He then underwent partial colectomy and  ileostomy and distal colostomy on October 21, 2004.  Postoperative course has  been complicated by prolonged intubation, tracheostomy on November 04, 2004,  wound dehiscence requiring debridement and wound VAC on November 02, 2004, MRSA  bacteremia, deconditioning and nonsustained VT ranging from 30 to 45  beats,  all asymptomatic, since January 31, 2005.  Recent abdominal films showed  sigmoid stricture/obstruction with surgery considering taking patient back  to OR to rule out CA.  We are asked to evaluate patient's cardiac surgical  risk given recent nonsustained VT.  Echo performed October 21, 2004, showed an  EF 55 to 60% with no regional wall motion abnormalities, mildly dilated LA,  mild to moderate MR.   ALLERGIES:  No known drug allergies.   CURRENT MEDICATIONS:  1.  Lantus 15 units q.12h.  2.  Lexapro 20 mg daily.  3.  Sliding scale insulin q.6h.  4.  Lyrica 25 mg t.i.d.  5.  Enoxaparin 40 mg daily.  6.  Sandostatin 200 mg q.8h.  7.  Klonopin 0.5 mg q.h.s.  8.  Duragesic patch 25 mcg an hour q.72h.  9.  Xopenex q.6h.  10. Seroquel 25 mg q.h.s.  11. Lopressor 12.5 mg q.8h.  12. Zyvox 600 mg q.12h.  13. TNA.   FAMILY HISTORY:  Positive for CAD and hypertension in the father.   SOCIAL HISTORY:  He was smoking a pack a day prior to hospitalization.  [place].   REVIEW OF SYSTEMS:  Positive for abdominal pain, shortness of breath, and  decreased urine output on admission.  He has been treated for diabetes since  admission likely secondary to TPN.  All other systems reviewed and negative.   PHYSICAL EXAMINATION:  GENERAL APPEARANCE:  A pleasant African American male  in no acute distress.  Awake, alert and oriented x3.  VITAL SIGNS:  Temperature 98.3, heart rate 86, respiratory rate 20, blood  pressure 129/84.  NECK:  He has tracheostomy.  There are no bruits or JVD.  LUNGS:  Clear to auscultation.  CARDIOVASCULAR:  Regular S1 and S2.  No S3, S4 or murmurs.  ABDOMEN:  Obese, soft, and diffusely tender.  Bowel sounds are present x3.  His midline ostomy site is pink without bleeding.  He has yellow soft  drainage.  EXTREMITIES:  No clubbing, cyanosis, or edema.  Dorsalis pedis and posterior  tibial pulses 2+ and equal bilaterally.    LABORATORY DATA:  Sodium 134, potassium 4.3, chloride 101, CO2 25, BUN 34,  creatinine 1.0, glucose 123.  Total bilirubin 0.6, alkaline phosphatase 187,  AST 97, ALT 198, total protein 7.8, albumin 10.4, calcium 8.9.  Phosphorus  3.4, magnesium 2.1.   ASSESSMENT/PLAN:  1.  Prior history of congestive heart failure likely diastolic.  Patient      appeared euvolemic.  Will continue beta-blocker, consider adding back      ACE whenever patient is able to take p.o.'s and is more stable.      Ejection fraction by echo October 21, 2004, was normal and will repeat      this.  2.  Nonsustained ventricular tachycardia.  Patient has been 30 and 45 beats      of nonsustained ventricular tachycardia since at least February 10, 2005.      All episodes have been asymptomatic.  As stated above, he had normal  Left ventricular function in March.  Will repeat his echo to evaluate change  in left ventricular function and rule out new wall motion abnormalities,  also cycle cardiac enzymes to rule out bilateral infarct. If echo and  enzymes are normal, patient is likely okay for surgery without additional  functional testing.  Would continue beta-blocker  throughout his course and titrate as tolerated.  1.  Hypertension.  Stable.  Titrate beta-blocker as above.  2.  Sigmoid stricture per general surgery.       CRB/MEDQ  D:  02/17/2005  T:  02/17/2005  Job:  [id number]

## 2010-12-31 NOTE — Consult Note (Signed)
NAMEAMEEN, [name]            ACCOUNT NO.:  [id number]   MEDICAL RECORD NO.:  [id number]          PATIENT TYPE:  INP   LOCATION:  0158                         FACILITY:  [facility]   PHYSICIAN:  Charlaine Dalton. Sherene Sires, M.D. [facility] OF BIRTH:  1946-06-18   DATE OF CONSULTATION:  10/21/2004  DATE OF DISCHARGE:                                   CONSULTATION   Asked to consult for patient with enlarged scrotum and possible scrotal  abscess.   SUBJECTIVE UPON INTERVIEW:  The patient states the scrotum has been enlarged  and painful for years.  The patient states that he had this evaluated  years ago but no further evaluation.   PHYSICAL EXAMINATION:  ABDOMEN:  Protuberant, distended.  GU:  Penis, meatus, foreskin within normal limits, Foley in place, scrotum  on right enlarged with palpable hydrocele, mildly tender left testis,  scrotum without masses, lateral and ____________ skin with multiple warts,  no drainage noted, stool apparent in skin fold.   WBC 6.4, hemoglobin 12.0, hematocrit 36.1, and platelets 247.  Renal  ultrasound reviewed; negative for hypernecrosis, left upper pole renal cyst.  CT scan with testicular hydrocele.   ASSESSMENT:  Right hydrocele.   PLAN:  Further evaluation with ___________ and scrotal ultrasound.  Have him  to follow with urology upon hospital discharge.      JML/MEDQ  D:  11/20/2004  T:  11/20/2004  Job:  [id number]

## 2010-12-31 NOTE — Consult Note (Signed)
NAMEEARLY, STEEL            ACCOUNT NO.:  [id number]   MEDICAL RECORD NO.:  [id number]          PATIENT TYPE:  INP   LOCATION:  0158                         FACILITY:  [facility]   PHYSICIAN:  Zola Button T. Lazarus Salines, M.D. DATE OF BIRTH:  03-Apr-1946   DATE OF CONSULTATION:  11/03/2004  DATE OF DISCHARGE:                                   CONSULTATION   CHIEF COMPLAINT:  Prolonged intubation.   HISTORY:  A 65 year old black male admitted two weeks ago with hypotension,  poor urine output, abdominal distention.  Was discovered to have a  perforated cecum secondary to sigmoid obstruction from a pelvic abscess.  He  underwent a partial colectomy, ileostomy, distal mucous fistula colostomy.  He required pressure support for several days secondary to septic shock.  Slowly he has been coming around.  Two days ago, with some coughing, he had  a wound dehiscence requiring operative re-exploration and wound  reapproximation.  He has now been intubated 12 days with no anticipation  that he will be strong enough or medically stable enough to wean in the  immediate future.  ENT was called in consultation when general surgery  decided that he was not a good candidate for a percutaneous tracheostomy.   EXAMINATION:  This is a large, obtunded black male.  He has a nasogastric  tube and an orotracheal tube in place.  The lower midline neck anatomy is  slightly obese but not particularly remarkable.   IMPRESSION:  1.  Prolonged intubation with anticipated prolonged medical and surgical      recovery.  2.  Moderate obesity.   PLAN:  We will attempt to find a place in the schedule later this week or,  at the very latest, the first part of next week, for a tracheostomy.  I  anticipate that a routine tracheostomy tube will be sufficiently long but  will order an extended length tube just in case.  I will discuss this with  the family as needed.      KTW/MEDQ  D:  11/03/2004  T:  11/04/2004  Job:   [id number]   cc:   Angelia Mould. Derrell Lolling, M.D.  [address], [address]  [place]  [place] [address]   Shan Levans, M.D. [facility]

## 2010-12-31 NOTE — Op Note (Signed)
NAMECLEMENCE, [name]            ACCOUNT NO.:  [id number]   MEDICAL RECORD NO.:  [id number]          PATIENT TYPE:  INP   LOCATION:  0158                         FACILITY:  [facility]   PHYSICIAN:  Angelia Mould. Derrell Lolling, M.D.DATE OF BIRTH:  21-Dec-1945   DATE OF PROCEDURE:  03/08/2005  DATE OF DISCHARGE:                                 OPERATIVE REPORT   PREOPERATIVE DIAGNOSIS:  1.  Chronic enterocutaneous fistula.  2.  Obstructing mass of the left colon.  3.  Complex abdominal wall defect.   POSTOPERATIVE DIAGNOSES:  1.  Chronic enterocutaneous fistula.  2.  Obstructing mass of the left colon.  3.  Complex abdominal wall defect.  4.  Extensive adhesions.   OPERATIONS PERFORMED:  1.  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  2.  Extensive lysis of adhesions requiring one hour.  3.  Left colectomy with takedown of splenic flexure and closure of rectal      stump.  4.  Repair of abdominal wall defect with 7 x 9 inches Bard Collamend implant      mesh.   SURGEON:  Angelia Mould. Derrell Lolling, M.D.   FIRST ASSISTANT:  Sandria Bales. Ezzard Standing, M.D.   OPERATIVE INDICATIONS:  This is a 65 year old black male who presented to  this hospital in septic shock about four months ago.  After resuscitation he was found to have a pneumoperitoneum.  He was  operated upon and found to have a perforation of his right colon with  extensive peritonitis.  He underwent an emergent right colectomy.  He was  also found to have an inflammatory mass and a small abscess around his  sigmoid colon, but he was too ill to have that resected and so we simply did  an  end-ileostomy and a left transverse colon mucous fistula.  Postop course  was complicated by wound dehiscence and the development of abdominal  compartment syndrome and enterocutaneous fistula.  He had a long and stormy  ICU course with pneumonia, respiratory failure, multiple episodes of  catheter sepsis and complex management of a huge  abdominal wound with an  enterocutaneous fistula.  He did stabilize and survive and came off the  ventilator and was transferred to the floor but had to be maintained on  hyperalimentation because of the drainage of his enterocutaneous fistula.  His wound got smaller, but the enterocutaneous fistula epithelialized and  became more like a stoma and so it was not felt this was would close.  He  has been studied both with colonoscopy with barium enema, injection of the  ileostomy and small bowel series.  He was found to have completely  obstructing mass of the sigmoid colon, but we cannot prove whether this is  cancer or inflammatory based on biopsy.  The patient has been quite  debilitated but is able to get out of bed to a chair with maximal  assistance.  He was advised that the only way for him to be able to eat  again was to have a major abdominal operation at the high risk.  We had  several conversations with the patient and  his family, and the decision was  made to go ahead with this.  He is brought to the operating room semi-  electively.   OPERATIVE TECHNIQUE:  Following the induction of general endotracheal  anesthesia, the patient's abdominal wall was washed.  The stoma bags in the right lower quadrant and the left upper quadrant were  removed.  The drainage system over the wound was removed, revealing a large  enterocutaneous fistula at the 12 o'clock position and about a 10 x 8 inch  patch of granulation tissue and chronically thickened skin around this where  the wound had contracted.  The abdominal wall was then prepped and draped in  a sterile fashion.  The patient had a Foley catheter already in place, and  he had a tracheostomy tube already in place.  He was given intravenous  antibiotics.  A midline incision was made from just below the xiphoid down  to where the chronic inflammatory area was, and then I took the incision to  the left and the right around the  enterocutaneous fistula and the  granulation tissue.  Dissection was carried down to the fascia above this  area, and the fascia was incised in the midline and the abdominal cavity  entered under direct vision.  I was able find an to open space around the  stomach.  I then slowly and very carefully dissected around the fistula and  small bowel, dividing the skin and the subcutaneous tissue and the  attenuated fascia.  After about one hour, I had completely mobilized  circumferentially and could lift the small bowel and the skin and the  fistula up off of the intra-abdominal contents.  I had to take about one  hour to divide all the adhesions of small bowel and colon.  I did identify  the loop of small bowel that was the fistula, and I divided this both  proximal and distal to the fistula with the GIA stapling device.  I did  control the mesenteric vessels with clamps, divided the mesenteric vessels  and then ligated them with 2-0 silk ties.  I removed the specimen, which  included small bowel and skin and the fistula, and sent this for pathologic  exam.   I then had to spend a great deal of time to dissect all the small bowel from  the ligament of Treitz down to the ileostomy, and everything looked fine.  The only problem was in the left lower quadrant where the sigmoid colon was.  There was a hard inflammatory mass.  I had to very carefully dissect all the  small bowel off of this.  Once I had done this, I chose to perform an  anastomosis between the proximal  and distal ends of the small bowel with  the GIA stapling device and closed the defect in the bowel wall with a TA-60  stapling device.  The mesentery was closed with interrupted sutures of 2-0  silk.  I placed Tisseel over the anastomosis and let that dry.   I then performed a left colectomy.  I very carefully finger-fractured the  inflammatory mass out of the left lower quadrant and stayed very superficial on the mass, being  careful not to go deep into the retroperitoneum.  I  ultimately was able to mobilize the mass up out of the left lower quadrant  until I could identify the proximal rectum.  I got around the proximal  rectum and divided it with a GIA stapling device.  I then mobilized the  sigmoid colon and the descending colon from distal to proximal, clamping the  mesenteric vessels, dividing them and ligating them with 2-0 silk ties.  We  gave indigo carmine at this point and it came out in the urine quite easily,  but there is no evidence of any indigo carmine leak into the abdominal  cavity.   In the left upper quadrant, we mobilized the left transverse colon mucous  fistula from the skin and using a knife and then electrocautery to dissect  it out of the subcutaneous tissue and then mobilized it out of the fascia  and returned it to the abdominal cavity.  We then mobilized the left  transverse colon away from the stomach by dividing the gastrocolic omentum  between clamps and ligatures.  We then identified the splenic flexure and  very carefully step by step mobilized it down out of the left upper  quadrant.  We then had the transverse colon and the descending colon fully  mobilized.  The mesentery of the transverse colon and the descending colon  was clamped, divided and ligated with 2-0 silk ties and the specimen was  removed.   At this point we changed our gloves and we irrigated the abdomen and pelvis  thoroughly with about 9 or 10 liters of saline.  A few bleeding points were  controlled with cautery and one placed down in the pelvis.  We used a piece  of Surgicel.  At the completion of the case there was no bleeding  whatsoever, and we did not seem to need any drains.  We checked the NG tube  in the stomach, and that felt fine.  We checked the small bowel from the ligament of Treitz to the ileostomy, and  it looked fine.  We checked the area of dissection in the left pericolic  gutter in the  pelvis, and there was no bleeding and it looked fine.  We then  mobilized the fascia all the way around with electrocautery, exposing about  6 cm or more of fascia circumferentially.  We closed the defect in the  fascia in the left upper quadrant where the mucous fistula was with  interrupted sutures of #1 Novofil.  Five such sutures were placed.   We chose to repair the defect in a complex fashion.  We placed some  interrupted sutures of #1 Novofil at the most superior and most inferior  portion of the wound and got a primary closure.  There appeared to be too  much tension centrally.  We placed Vicryl mesh around the edges of the wound  to hold the bowel in place and held that in place with a running suture of 2-  0 Vicryl.  We then brought a 7 x 9-inch piece of Bard brand Collamend  implant biologic mesh.  This was trimmed a little bit out laterally to fit  the wound and then was sutured in place with interrupted mattress sutures of #1 Novofil.  First on the patient's right side we placed numerous sutures  around the perimeter.  We then placed several sutures close to the edge of  the fascia on the right side.  We then did the same technique with an inner  layer and an outer layer of interrupted mattress sutures of #1 Novofil to  fix the Collamend implant implant onto the fascia as an anterior onlay  repair.  The [employer] representative was at the bedside, and he advised that we  would get better tissue ingrowth if we could place the anterior on the raw  fascia than if we placed it on the peritoneal surface.   The wound was irrigated with saline.  We placed two 19-French Blake drains  in the wound and brought them out through separate stab incisions  superiorly, sutured these to the skin with nylon sutures and connected these  to suction bulbs.  All of the skin incisions were closed with interrupted  mattress sutures of 2-0 nylon and some skin staples.  Ileostomy bag was  placed in the  right lower quadrant.  Clean bandages were placed and the patient returned to the ICU in stable but  critical condition.  Estimated blood loss was about 400-500 mL.  Complications:  None.  Sponge,  needle and instrument counts were correct.       HMI/MEDQ  D:  03/08/2005  T:  03/09/2005  Job:  [id number]   cc:   Charlaine Dalton. Sherene Sires, M.D. [facility]

## 2010-12-31 NOTE — Discharge Summary (Signed)
[name], [name]            ACCOUNT NO.:  [id number]   MEDICAL RECORD NO.:  [id number]          PATIENT TYPE:  INP   LOCATION:  1406                         FACILITY:  [facility]   PHYSICIAN:  Angelia Mould. Derrell Lolling, M.D.DATE OF BIRTH:  02/05/1946   DATE OF ADMISSION:  10/20/2004  DATE OF DISCHARGE:  04/08/2005                                 DISCHARGE SUMMARY   FINAL DIAGNOSES:  1.  Perforation of right colon with peritonitis.  2.  Obstruction of left colon secondary to diverticulitis with diverticular      abscess.  3.  Postoperative wound dehiscence.  4.  Postoperative enterocutaneous fistula.  5.  Ventilator dependent respiratory failure.  6.  Nonsustained ventricular tachycardia.  7.  Chronic obstructive pulmonary disease.  8.  Congestive heart failure.  9.  Nonischemic cardiomyopathy.  10. Hypertension.  11. Obesity.  12. 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  13. Tobacco abuse.  14. Large right hydrocele.  15. History of umbilical herniorrhaphy.  16. Hospital-acquired pneumonia.  17. Acute renal insufficiency, resolved.   OPERATIONS PERFORMED:  1.  Laparotomy with right colectomy, end ileostomy, left transverse colon      mucus fistula, drainage of pelvic abscess, date October 21, 2004.  2.  Debridement of skin, subcutaneous tissue, and fascia of abdominal wall,      and placement of decompressive vacuum dressing, date November 02, 2004.  3.  CT-guided drainage abscess of the right lateral peritoneal cavity, date      November 05, 2004.  4.  Tracheotomy, date November 04, 2004.  5.  Cardiac catheterization revealing normal coronary arteries, normal left      ventricular end diastolic pressure, and non-ischemic cardiomyopathy,      date March 01, 2005.  6.  Exploratory laparotomy, resection of enterocutaneous fistula with small      bowel resection, extensive lysis of adhesions, left colectomy with take-      down of splenic flexure, repair of abdominal  wall with 9 inch x 7 inch      Bard Collamend implant mesh, date March 08, 2005.  7.  CT-guided drainage of left lower quadrant abscess, date March 18, 2005.   HISTORY:  This is an older black gentleman who presented to [facility]  emergency room on October 20, 2004 with shortness of breath, profound oliguria,  and dehydration, lower abdominal pain, and profound azotemia with a BUN of  109 and a creatinine of 10.1.  He was admitted by the medical service and  seen promptly by the critical care medicine service and the nephrology  service, and he was aggressively hydrated in the ICU.  The following  morning, his abdominal pain seemed worse, and a CT scan showed significant  pneumoperitoneum, but no focal source for perforated viscus.  I was asked to  see him.  I felt that he had peritonitis, and was taken to the operating  room and explored.  He was found to have a perforation of the right colon,  presumably secondary to a mass in the sigmoid colon.  At that point, the  patient was in septic shock,  and we did a right colectomy, end ileostomy,  and a left transverse colon mucus fistula.  He had a small abscess around  the distal sigmoid colon mass, which we drained, but we did not feel that he  was stable enough to resect the sigmoid colon, so we did not do that.  Postoperatively, he remained stable on the ventilator, although he had  congestive heart failure and pneumonia.  On November 01, 2004, they began to  let him awaken to see if they could begin to wean him from the ventilator,  and he had a lot of coughing spell, and on exam on rounds in the morning on  November 02, 2004, he was found to have an abdominal wall dehiscence with small  bowel in the wound.  I returned him to the operating room promptly.  He did  have some necrotic skin and subcutaneous tissue, which I debrided.  The  small bowel was edematous and densely adherent to the peritoneal surface,  and it did not appear to be safe to  dissect that off, so I placed a vacuum  dressing in the wound.   On November 04, 2004, he underwent a tracheostomy by Dr. Flo Shanks, which  was uneventful.  On November 05, 2004, we did a CT scan, which showed an  abscess in the right lateral peritoneal cavity, and that was drained  percutaneously by the radiology department, and that was successful.  Antibiotics were adjusted appropriately.   Shortly thereafter, he developed drainage from her abdominal wound,  consistent with small bowel content, and I examined the wound and he, in  fact, did have a defect in the small bowel in the upper part of the wound.  We managed this large wound then with a drainage bag fashioned all the way  around the wound to control the drainage.  He drained a large amount  initially, but we placed him on Sandostatin, and that controlled the  drainage.   Over the next several weeks and few months, we actually stabilized him.  With good wound care, the wound got much smaller, but still was about 7 x 9  inches in size with a granulating base inferiorly and a somatized  enterocutaneous fistula, which was not obviously going to close because it  had epithelialized to the wound edges.  Pulmonary status actually  stabilized.  Dr. Sandrea Hughs and I discussed his care, and in mid July we  felt that if he could be stabilized from a cardiac and pulmonary standpoint,  we would consider taking him back to the operating room to restore his  gastrointestinal tract.  The patient was seen by physical therapy, and  because of his extreme disability from his bilateral hip disease, they felt  that his best future goal would be to be able to transfer from bed to a  wheelchair, and that he probably would not be able to do anything further  than that.  Because of that, I did not feel that it would be warranted to  perform anastomosis, for fear that he would develop skin breakdown from stooling.  He was advised that to have surgery  he would have to have the  entire left colon resected because of the mass in his sigmoid colon.  He had  a colonoscopy and a Gastrografin enema.  Both of these showed complete  obstruction at the mid sigmoid level.  Biopsies simply showed colonic  inflammation.  There was no sign of malignancy.  He  had a small bowel series  with prograde through his panda tube and retrograde through his ileostomy  demonstrating a fistula in the upper small bowel, but no obstruction  elsewhere.  He underwent a cardiac catheterization by the [facility]  [facility], which showed normal coronary arteries, normal left ventricular end  diastolic pressure, and they felt that he had a non-ischemic cardiomyopathy.   The patient was returned to the operating room on March 08, 2005.  At that  time, we resected all of the chronically inflamed tissues of his abdominal  wall, including the enterocutaneous fistula, entered the abdominal cavity,  resected the involved small bowel, and performed a small bowel anastomosis.  We then took down extensive adhesions, taking about 1 hour, explored the  entire abdominal and pelvis cavities.  We took down the left transverse  colon mucus fistula and resected the entire left colon and the inflammatory  mass in the distal sigmoid, and stapled off the proximal rectum with a  stapler.  We then repaired the abdominal wall with a 7 inch x 9 inch piece  of Collamend porcine biologic mesh.  We had closed the fascia with a Vicryl  mesh bridge below that, and then put the Collamend mesh on top.   Postoperatively, the patient did reasonably well, although it took a month  for him to get ready to go home.  He did develop some purulence in the mucus  fistula site in the left mid abdomen, which had to be opened up and  initially treated with a vac dressing, and then when it began to granulate,  we changed over to wet-to-dry dressings.  His midline wound healed without  complications.  His  drains were removed.  He did develop a left lower  quadrant and upper pelvic abscess, which was drained successfully by the  radiology department.  Follow up CT scan showed that that abscess had  completely resolved, and there was no other sign of any abscess or infection  within the abdomen, and the drain was pulled.   Dr. Sandrea Hughs felt that his pulmonary status was stable, and he  decannulated his tracheostomy tube, and his trachea stoma healed up without  any problem.  The patient's voice was normal.  He began swallowing normally,  and we switched from panda tube feedings over to oral diet, and he tolerated  that well.   At the time of discharge, he is still markedly deconditioned, and cannot  transfer out of bed independently, and requires full support for that.  He  is otherwise stable and afebrile off of antibiotics.   Wound care from this point on will be saline and fine mesh gauze, wet to dry dressings to his left upper quadrant wound b.i.d.  He has no other wound  care, other than an ileostomy in the right lower quadrant, which simply  requires an ostomy bag to be changed periodically.   MEDICATIONS AT THE TIME OF DISCHARGE:  1.  Protonix 40 mg daily.  2.  Cordarone 200 mg daily.  3.  Lopressor 12.5 mg b.i.d.  4.  Sliding scale insulin.  5.  Multivitamins.  6.  Nutritional supplements.  7.  Tylenol.  8.  Senokot.   FOLLOW UP:  The patient will be advised to return to see me in the office in  1 month.   DISPOSITION:  He is to be discharged to a skilled nursing facility.      Angelia Mould. Derrell Lolling, M.D.  Electronically Signed  HMI/MEDQ  D:  04/07/2005  T:  04/07/2005  Job:  [id number]   cc:   Charlaine Dalton. Sherene Sires, M.D. [facility]  [address]  [place]  [place] [address]   Mindi Slicker. Lowell Guitar, M.D.  [address]  [place]  [place] [address]  Fax: [phone]   Jesse Sans. Wall, M.D.  [address]  [address]  [place]  [place] [address]   Gloris Manchester. Lazarus Salines, M.D.  [address]  [place]  [place] [address]  Fax: [phone]

## 2013-11-29 ENCOUNTER — Emergency Department (INDEPENDENT_AMBULATORY_CARE_PROVIDER_SITE_OTHER): Payer: Medicare Other

## 2013-11-29 ENCOUNTER — Encounter (HOSPITAL_COMMUNITY): Payer: Self-pay | Admitting: Emergency Medicine

## 2013-11-29 ENCOUNTER — Emergency Department (INDEPENDENT_AMBULATORY_CARE_PROVIDER_SITE_OTHER)
Admission: EM | Admit: 2013-11-29 | Discharge: 2013-11-29 | Disposition: A | Discharge status: Home / Self Care | Payer: Medicare Other | Source: Home / Self Care | Attending: Family Medicine | Admitting: Family Medicine

## 2013-11-29 DIAGNOSIS — W19XXXA Unspecified fall, initial encounter: Secondary | ICD-10-CM

## 2013-11-29 DIAGNOSIS — S52613A Displaced fracture of unspecified ulna styloid process, initial encounter for closed fracture: Secondary | ICD-10-CM

## 2013-11-29 DIAGNOSIS — S52611A Displaced fracture of right ulna styloid process, initial encounter for closed fracture: Secondary | ICD-10-CM

## 2013-11-29 DIAGNOSIS — S52609A Unspecified fracture of lower end of unspecified ulna, initial encounter for closed fracture: Secondary | ICD-10-CM

## 2013-11-29 HISTORY — DX: Essential (primary) hypertension: I10

## 2013-11-29 HISTORY — DX: Unspecified osteoarthritis, unspecified site: M19.90

## 2013-11-29 MED ORDER — HYDROCODONE-ACETAMINOPHEN 5-325 MG PO TABS: 1.0000 | ORAL_TABLET | Freq: Four times a day (QID) | ORAL | Status: DC | PRN

## 2013-11-29 NOTE — ED Notes (Signed)
Pt  Reports    He   Fell       4  Days  Ago  He  Injured  His  Wrists         -  The  l  Wrist  Is  Swollen        And  painfull         The  r  Is  painfull  As  Well         The  Pt  Did  Not black out        He  Is  Awake  And  Alert   He  denys  Other  injurys       Except  The  Wrist

## 2013-11-29 NOTE — Discharge Instructions (Signed)
Call next week for appt to see dr Grandville Silos.

## 2013-11-29 NOTE — ED Provider Notes (Signed)
CSN: [id number]     Arrival date & time 11/29/13  1251 History   First MD Initiated Contact with Patient 11/29/13 1347     Chief Complaint  Patient presents with  . Fall   (Consider location/radiation/quality/duration/timing/severity/associated sxs/prior Treatment) Patient is a 68 y.o. male presenting with fall. The history is provided by the patient.  Fall This is a new problem. The current episode started more than 2 days ago (fell and landed on both wrists with pain and swelling since.). The problem has been gradually worsening.    Past Medical History  Diagnosis Date  . Hypertension   . Arthritis    Past Surgical History  Procedure Laterality Date  . Colon surgery     No family history on file. History  Substance Use Topics  . Smoking status: Current Every Day Smoker  . Smokeless tobacco: Not on file  . Alcohol Use: No    Review of Systems  Constitutional: Negative.   Musculoskeletal: Positive for joint swelling.    Allergies  Review of patient's allergies indicates no known allergies.  Home Medications   Prior to Admission medications   Medication Sig Start Date End Date Taking? Authorizing Provider  AMLODIPINE BESYLATE PO Take by mouth.   Yes Historical Provider, MD   BP 170/72  Pulse 78  Temp(Src) 98.6 F (37 C) (Oral)  Resp 18  SpO2 97% Physical Exam  Nursing note and vitals reviewed. Constitutional: He is oriented to person, place, and time. He appears well-developed and well-nourished.  Musculoskeletal: He exhibits tenderness.       Right elbow: Normal.      Left elbow: Normal.       Right wrist: He exhibits bony tenderness. He exhibits normal range of motion, no tenderness, no swelling and no deformity.       Left wrist: He exhibits decreased range of motion, tenderness, bony tenderness, swelling and deformity.  Neurological: He is alert and oriented to person, place, and time.  Skin: Skin is warm and dry.    ED Course  Procedures (including  critical care time) Labs Review Labs Reviewed - No data to display  No results found for this or any previous visit. Imaging Review No results found. X-rays reviewed and report per radiologist.   MDM   1. Fracture of ulnar styloid   2. Fracture of right ulnar styloid        Billy Fischer, MD 12/06/13 1012

## 2013-11-29 NOTE — ED Notes (Signed)
bilatearl  Wrist  Splints  Applied  To  Both  Wrists

## 2015-08-23 IMAGING — CR DG WRIST COMPLETE 3+V*R*
4 series · 4 of 4 positions shown · non-contrast
Comparison: None.

CLINICAL DATA: Wrist pain and swelling, fall off porch

EXAM:
RIGHT WRIST - COMPLETE 3+ VIEW

[view not recorded (1 of 4)]
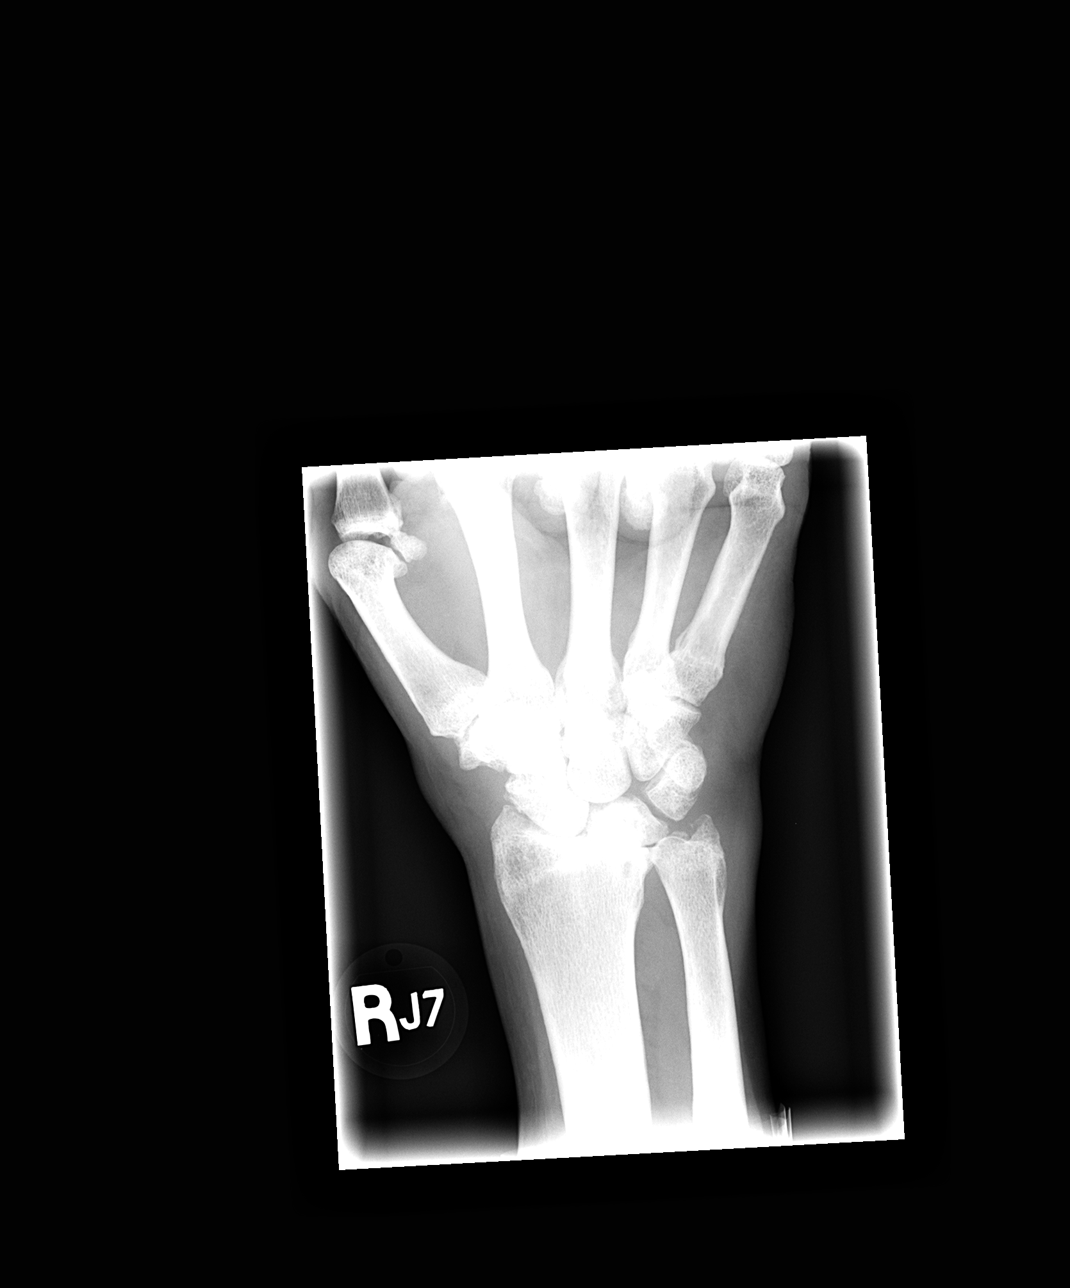

[view not recorded (2 of 4)]
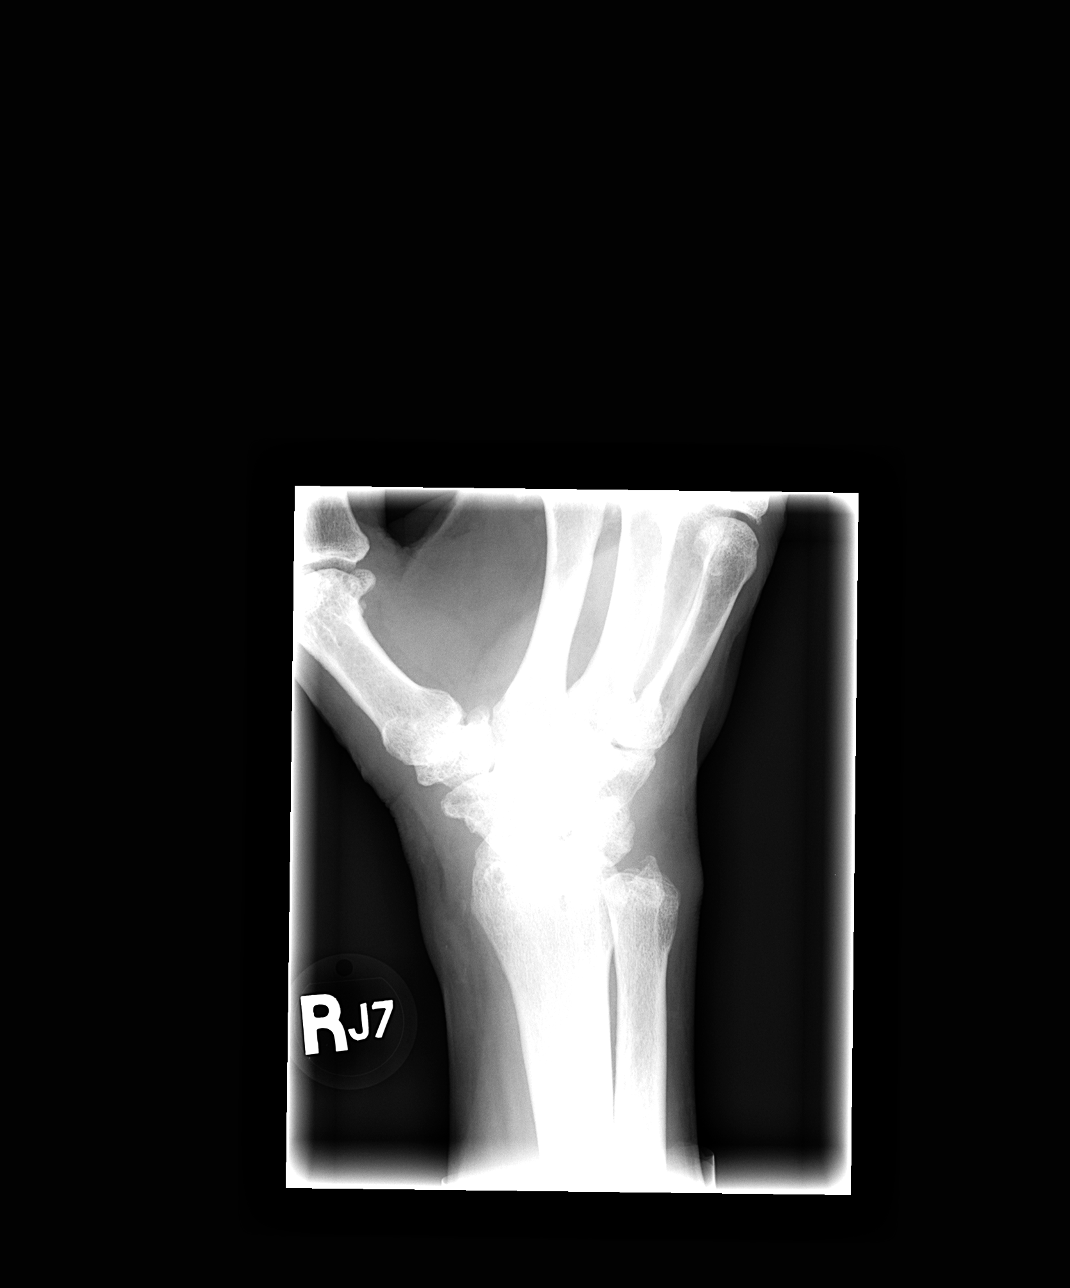

[view not recorded (3 of 4)]
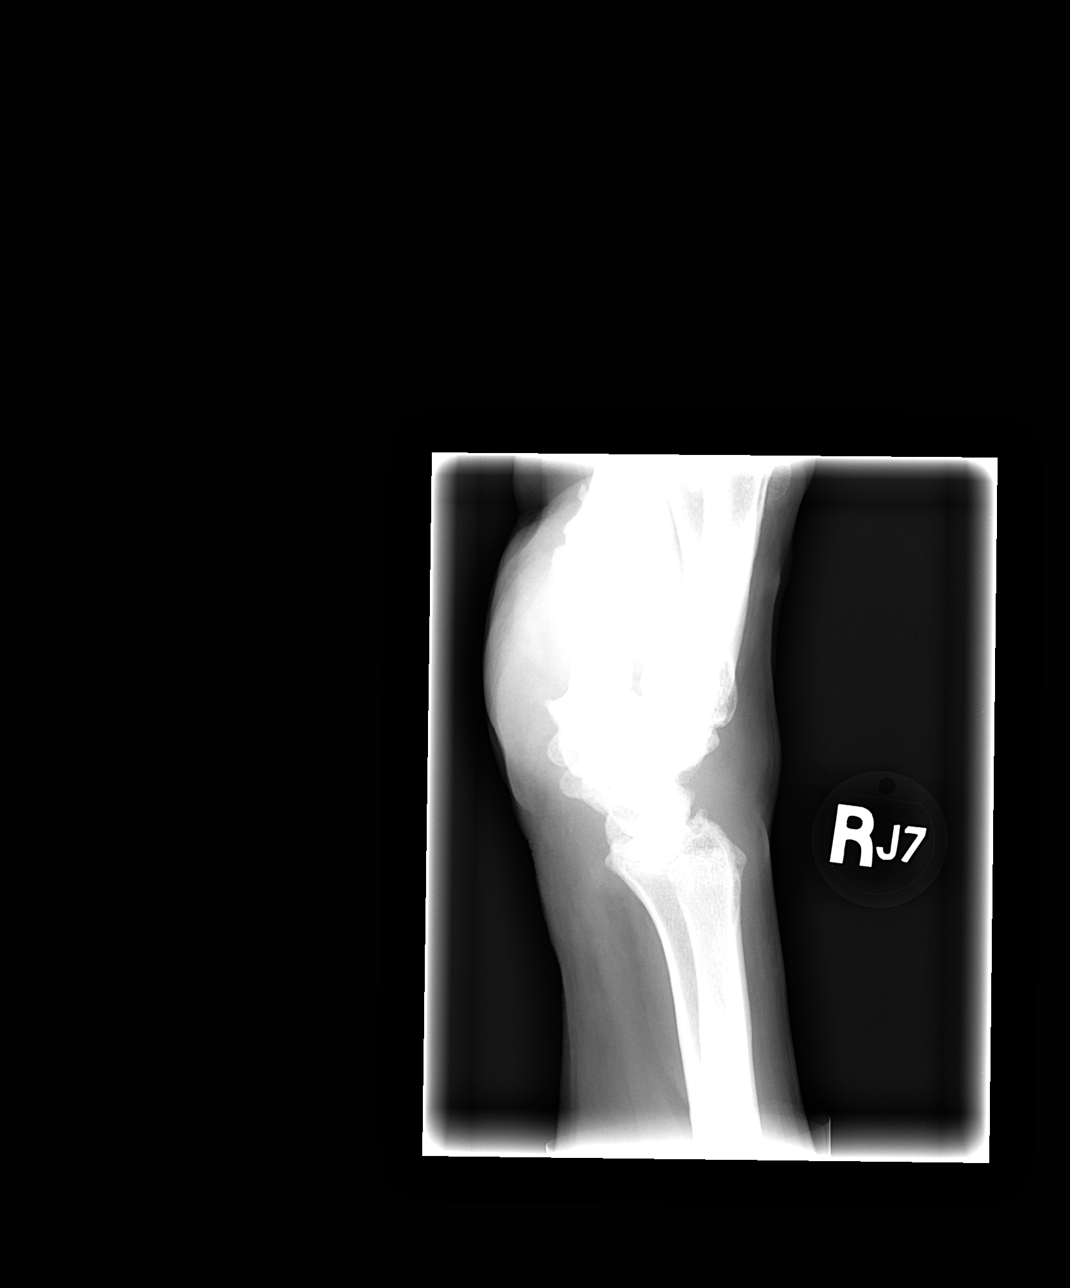

[view not recorded (4 of 4)]
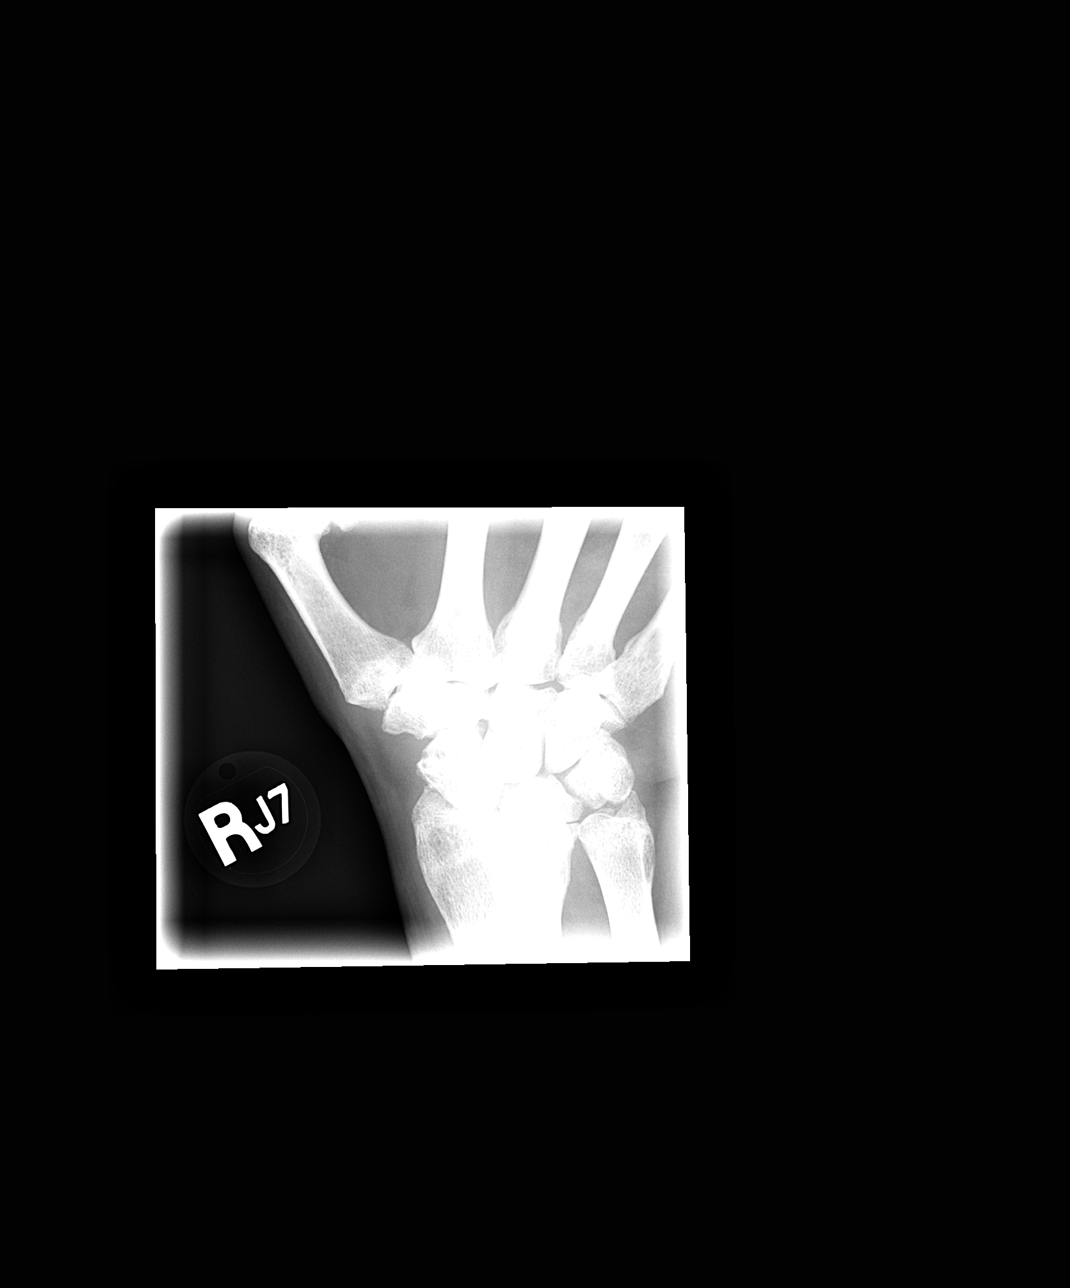

[4 of 4 positions shown; findings below may reference images not displayed]

FINDINGS: Radiocarpal degenerative change noted as well as degenerative change
at the first metacarpophalangeal joint. Nondisplaced fracture of the
ulnar styloid process with adjacent soft tissue swelling is noted.
Mild first carpometacarpal joint degenerative change also noted.
IMPRESSION: Nondisplaced ulnar styloid process fracture.

Radiocarpal degenerative change.

## 2016-02-02 ENCOUNTER — Encounter: Payer: Self-pay | Admitting: Internal Medicine

## 2016-02-02 ENCOUNTER — Telehealth: Payer: Self-pay | Admitting: Internal Medicine

## 2016-02-02 ENCOUNTER — Telehealth: Payer: Self-pay

## 2016-02-02 ENCOUNTER — Telehealth: Payer: Self-pay | Admitting: Gastroenterology

## 2016-02-02 NOTE — Telephone Encounter (Signed)
Do not bill 

## 2016-02-02 NOTE — Telephone Encounter (Signed)
An urgent referral was received from [facility] to have the pt seen for anemia, Hgb of 7.7 down from 12 in March 2017.  Dr Ardis Hughs reviewed and advised the pt of an appt today with Dr Henrene Pastor.  I spoke to the pt and offered him an appt with Dr Henrene Pastor for today but he declined, he was also offered an appt with the extender for 02/03/16 which he declined stating he has no time to be seen at this time due to a death in the family as well as money problems.  I have called the referring and notified them of this information.  The pt did accept an appt for 04/04/16 with Dr Henrene Pastor.

## 2016-02-02 NOTE — Telephone Encounter (Signed)
Addendum to previous phone note:  I spoke with Patty and told her that Dr. Henrene Pastor said the patient didn't have to see just him to get him in sooner - he could see any provider.  Patty reiterated that she had tried that, had looked at everyone's schedule in the hopes of getting him in sooner than 8/21 but patient was absolutely adamant he only wanted 8/21wtih Dr. Henrene Pastor.

## 2016-04-04 ENCOUNTER — Ambulatory Visit: Payer: Medicare Other | Admitting: Internal Medicine

## 2016-06-07 ENCOUNTER — Emergency Department (HOSPITAL_COMMUNITY): Payer: Medicare Other

## 2016-06-07 ENCOUNTER — Encounter (HOSPITAL_COMMUNITY): Payer: Self-pay

## 2016-06-07 ENCOUNTER — Inpatient Hospital Stay (HOSPITAL_COMMUNITY)
Admission: EM | Admit: 2016-06-07 | Discharge: 2016-06-15 | DRG: 870 | Disposition: A | Discharge status: Home Health | Payer: Medicare Other | Attending: Internal Medicine | Admitting: Internal Medicine

## 2016-06-07 DIAGNOSIS — I82611 Acute embolism and thrombosis of superficial veins of right upper extremity: Secondary | ICD-10-CM | POA: Diagnosis not present

## 2016-06-07 DIAGNOSIS — R1013 Epigastric pain: Secondary | ICD-10-CM

## 2016-06-07 DIAGNOSIS — Z933 Colostomy status: Secondary | ICD-10-CM | POA: Diagnosis not present

## 2016-06-07 DIAGNOSIS — R680 Hypothermia, not associated with low environmental temperature: Secondary | ICD-10-CM | POA: Diagnosis present

## 2016-06-07 DIAGNOSIS — E860 Dehydration: Secondary | ICD-10-CM | POA: Diagnosis present

## 2016-06-07 DIAGNOSIS — N281 Cyst of kidney, acquired: Secondary | ICD-10-CM | POA: Diagnosis present

## 2016-06-07 DIAGNOSIS — Z9289 Personal history of other medical treatment: Secondary | ICD-10-CM

## 2016-06-07 DIAGNOSIS — I420 Dilated cardiomyopathy: Secondary | ICD-10-CM | POA: Diagnosis not present

## 2016-06-07 DIAGNOSIS — E861 Hypovolemia: Secondary | ICD-10-CM | POA: Diagnosis present

## 2016-06-07 DIAGNOSIS — I4729 Other ventricular tachycardia: Secondary | ICD-10-CM

## 2016-06-07 DIAGNOSIS — R262 Difficulty in walking, not elsewhere classified: Secondary | ICD-10-CM

## 2016-06-07 DIAGNOSIS — J449 Chronic obstructive pulmonary disease, unspecified: Secondary | ICD-10-CM | POA: Diagnosis present

## 2016-06-07 DIAGNOSIS — L8992 Pressure ulcer of unspecified site, stage 2: Secondary | ICD-10-CM | POA: Diagnosis present

## 2016-06-07 DIAGNOSIS — A630 Anogenital (venereal) warts: Secondary | ICD-10-CM | POA: Diagnosis present

## 2016-06-07 DIAGNOSIS — I472 Ventricular tachycardia: Secondary | ICD-10-CM | POA: Diagnosis not present

## 2016-06-07 DIAGNOSIS — G9341 Metabolic encephalopathy: Secondary | ICD-10-CM | POA: Diagnosis present

## 2016-06-07 DIAGNOSIS — I471 Supraventricular tachycardia: Secondary | ICD-10-CM | POA: Diagnosis not present

## 2016-06-07 DIAGNOSIS — I2510 Atherosclerotic heart disease of native coronary artery without angina pectoris: Secondary | ICD-10-CM | POA: Diagnosis present

## 2016-06-07 DIAGNOSIS — R001 Bradycardia, unspecified: Secondary | ICD-10-CM | POA: Diagnosis not present

## 2016-06-07 DIAGNOSIS — I130 Hypertensive heart and chronic kidney disease with heart failure and stage 1 through stage 4 chronic kidney disease, or unspecified chronic kidney disease: Secondary | ICD-10-CM | POA: Diagnosis present

## 2016-06-07 DIAGNOSIS — N179 Acute kidney failure, unspecified: Secondary | ICD-10-CM | POA: Diagnosis present

## 2016-06-07 DIAGNOSIS — D6959 Other secondary thrombocytopenia: Secondary | ICD-10-CM | POA: Diagnosis not present

## 2016-06-07 DIAGNOSIS — N170 Acute kidney failure with tubular necrosis: Secondary | ICD-10-CM | POA: Diagnosis not present

## 2016-06-07 DIAGNOSIS — R188 Other ascites: Secondary | ICD-10-CM | POA: Diagnosis not present

## 2016-06-07 DIAGNOSIS — N183 Chronic kidney disease, stage 3 (moderate): Secondary | ICD-10-CM | POA: Diagnosis present

## 2016-06-07 DIAGNOSIS — Z978 Presence of other specified devices: Secondary | ICD-10-CM

## 2016-06-07 DIAGNOSIS — I255 Ischemic cardiomyopathy: Secondary | ICD-10-CM | POA: Diagnosis present

## 2016-06-07 DIAGNOSIS — E875 Hyperkalemia: Secondary | ICD-10-CM | POA: Diagnosis present

## 2016-06-07 DIAGNOSIS — Z79899 Other long term (current) drug therapy: Secondary | ICD-10-CM

## 2016-06-07 DIAGNOSIS — L8990 Pressure ulcer of unspecified site, unspecified stage: Secondary | ICD-10-CM | POA: Insufficient documentation

## 2016-06-07 DIAGNOSIS — A419 Sepsis, unspecified organism: Secondary | ICD-10-CM | POA: Diagnosis not present

## 2016-06-07 DIAGNOSIS — D7582 Heparin induced thrombocytopenia (HIT): Secondary | ICD-10-CM

## 2016-06-07 DIAGNOSIS — N5089 Other specified disorders of the male genital organs: Secondary | ICD-10-CM

## 2016-06-07 DIAGNOSIS — I5022 Chronic systolic (congestive) heart failure: Secondary | ICD-10-CM | POA: Diagnosis present

## 2016-06-07 DIAGNOSIS — N433 Hydrocele, unspecified: Secondary | ICD-10-CM | POA: Diagnosis present

## 2016-06-07 DIAGNOSIS — E872 Acidosis: Secondary | ICD-10-CM | POA: Diagnosis present

## 2016-06-07 DIAGNOSIS — F17200 Nicotine dependence, unspecified, uncomplicated: Secondary | ICD-10-CM | POA: Diagnosis present

## 2016-06-07 DIAGNOSIS — I493 Ventricular premature depolarization: Secondary | ICD-10-CM | POA: Diagnosis not present

## 2016-06-07 DIAGNOSIS — R6520 Severe sepsis without septic shock: Secondary | ICD-10-CM

## 2016-06-07 DIAGNOSIS — N509 Disorder of male genital organs, unspecified: Secondary | ICD-10-CM | POA: Diagnosis not present

## 2016-06-07 DIAGNOSIS — I479 Paroxysmal tachycardia, unspecified: Secondary | ICD-10-CM | POA: Diagnosis not present

## 2016-06-07 DIAGNOSIS — Z452 Encounter for adjustment and management of vascular access device: Secondary | ICD-10-CM

## 2016-06-07 DIAGNOSIS — D638 Anemia in other chronic diseases classified elsewhere: Secondary | ICD-10-CM | POA: Diagnosis present

## 2016-06-07 DIAGNOSIS — G9340 Encephalopathy, unspecified: Secondary | ICD-10-CM | POA: Diagnosis not present

## 2016-06-07 DIAGNOSIS — R609 Edema, unspecified: Secondary | ICD-10-CM | POA: Diagnosis not present

## 2016-06-07 DIAGNOSIS — D75829 Heparin-induced thrombocytopenia, unspecified: Secondary | ICD-10-CM

## 2016-06-07 DIAGNOSIS — J9601 Acute respiratory failure with hypoxia: Secondary | ICD-10-CM | POA: Diagnosis not present

## 2016-06-07 DIAGNOSIS — R6521 Severe sepsis with septic shock: Secondary | ICD-10-CM | POA: Diagnosis not present

## 2016-06-07 DIAGNOSIS — I429 Cardiomyopathy, unspecified: Secondary | ICD-10-CM | POA: Diagnosis not present

## 2016-06-07 DIAGNOSIS — R109 Unspecified abdominal pain: Secondary | ICD-10-CM

## 2016-06-07 LAB — BASIC METABOLIC PANEL
ANION GAP: 11 (ref 5–15)
BUN: 138 mg/dL — AB (ref 6–20)
BUN: 127 mg/dL — ABNORMAL HIGH (ref 6–20)
CHLORIDE: 118 mmol/L — AB (ref 101–111)
CO2: <7 mmol/L — ABNORMAL LOW (ref 22–32)
CO2: 9 mmol/L — ABNORMAL LOW (ref 22–32)
CREATININE: 9.49 mg/dL — AB (ref 0.61–1.24)
Calcium: 9.7 mg/dL (ref 8.9–10.3)
Calcium: 8.9 mg/dL (ref 8.9–10.3)
Chloride: 121 mmol/L — ABNORMAL HIGH (ref 101–111)
Creatinine, Ser: 10.96 mg/dL — ABNORMAL HIGH (ref 0.61–1.24)
GFR calc Af Amer: 5 mL/min — ABNORMAL LOW (ref >60)
GFR calc non Af Amer: 4 mL/min — ABNORMAL LOW (ref >60)
GFR, EST AFRICAN AMERICAN: 6 mL/min — AB (ref >60)
GFR, EST NON AFRICAN AMERICAN: 5 mL/min — AB (ref >60)
Glucose, Bld: 119 mg/dL — ABNORMAL HIGH (ref 65–99)
Glucose, Bld: 127 mg/dL — ABNORMAL HIGH (ref 65–99)
POTASSIUM: 6.9 mmol/L — AB (ref 3.5–5.1)
Potassium: 5.6 mmol/L — ABNORMAL HIGH (ref 3.5–5.1)
SODIUM: 135 mmol/L (ref 135–145)
SODIUM: 141 mmol/L (ref 135–145)

## 2016-06-07 LAB — COMPREHENSIVE METABOLIC PANEL
ALK PHOS: 103 U/L (ref 38–126)
ALT: 31 U/L (ref 17–63)
ANION GAP: 11 (ref 5–15)
AST: 17 U/L (ref 15–41)
Albumin: 3.5 g/dL (ref 3.5–5.0)
BILIRUBIN TOTAL: 0.7 mg/dL (ref 0.3–1.2)
BUN: 139 mg/dL — ABNORMAL HIGH (ref 6–20)
CALCIUM: 9.9 mg/dL (ref 8.9–10.3)
CO2: 8 mmol/L — AB (ref 22–32)
CREATININE: 11.31 mg/dL — AB (ref 0.61–1.24)
Chloride: 115 mmol/L — ABNORMAL HIGH (ref 101–111)
GFR, EST AFRICAN AMERICAN: 5 mL/min — AB (ref >60)
GFR, EST NON AFRICAN AMERICAN: 4 mL/min — AB (ref >60)
Glucose, Bld: 133 mg/dL — ABNORMAL HIGH (ref 65–99)
Potassium: >7.5 mmol/L (ref 3.5–5.1)
SODIUM: 134 mmol/L — AB (ref 135–145)
TOTAL PROTEIN: 8.9 g/dL — AB (ref 6.5–8.1)

## 2016-06-07 LAB — CBC WITH DIFFERENTIAL/PLATELET
BASOS PCT: 0 %
Basophils Absolute: 0 10*3/uL (ref 0.0–0.1)
EOS ABS: 0 10*3/uL (ref 0.0–0.7)
Eosinophils Relative: 0 %
HCT: 40.7 % (ref 39.0–52.0)
Hemoglobin: 12.5 g/dL — ABNORMAL LOW (ref 13.0–17.0)
LYMPHS ABS: 0.6 10*3/uL — AB (ref 0.7–4.0)
Lymphocytes Relative: 5 %
MCH: 23.1 pg — AB (ref 26.0–34.0)
MCHC: 30.7 g/dL (ref 30.0–36.0)
MCV: 75.2 fL — ABNORMAL LOW (ref 78.0–100.0)
MONO ABS: 0.8 10*3/uL (ref 0.1–1.0)
Monocytes Relative: 6 %
NEUTROS PCT: 89 %
Neutro Abs: 11.2 10*3/uL — ABNORMAL HIGH (ref 1.7–7.7)
PLATELETS: 286 10*3/uL (ref 150–400)
RBC: 5.41 MIL/uL (ref 4.22–5.81)
RDW: 21.8 % — AB (ref 11.5–15.5)
WBC: 12.6 10*3/uL — AB (ref 4.0–10.5)

## 2016-06-07 LAB — I-STAT CG4 LACTIC ACID, ED
Lactic Acid, Venous: 1.92 mmol/L (ref 0.5–1.9)
Lactic Acid, Venous: 1.77 mmol/L (ref 0.5–1.9)

## 2016-06-07 LAB — GLUCOSE, CAPILLARY: GLUCOSE-CAPILLARY: 111 mg/dL — AB (ref 65–99)

## 2016-06-07 LAB — I-STAT TROPONIN, ED: TROPONIN I, POC: 0.02 ng/mL (ref 0.00–0.08)

## 2016-06-07 LAB — PROCALCITONIN: PROCALCITONIN: 0.33 ng/mL

## 2016-06-07 LAB — MRSA PCR SCREENING: MRSA BY PCR: NEGATIVE

## 2016-06-07 MED ORDER — PIPERACILLIN-TAZOBACTAM IN DEX 2-0.25 GM/50ML IV SOLN
2.2500 g | Freq: Three times a day (TID) | INTRAVENOUS | Status: DC
  Administered 2016-06-08 – 2016-06-11 (×11): 2.25 g via INTRAVENOUS
  Filled 2016-06-07 (×15): qty 50

## 2016-06-07 MED ORDER — HEPARIN SODIUM (PORCINE) 5000 UNIT/ML IJ SOLN
5000.0000 [IU] | Freq: Three times a day (TID) | INTRAMUSCULAR | Status: DC
  Administered 2016-06-08 – 2016-06-13 (×16): 5000 [IU] via SUBCUTANEOUS
  Filled 2016-06-07 (×18): qty 1

## 2016-06-07 MED ORDER — SODIUM CHLORIDE 0.9 % IV SOLN: 250.0000 mL | INTRAVENOUS | Status: DC | PRN

## 2016-06-07 MED ORDER — SODIUM CHLORIDE 0.9 % IV BOLUS (SEPSIS)
1000.0000 mL | Freq: Once | INTRAVENOUS | Status: AC
  Administered 2016-06-07: 1000 mL via INTRAVENOUS

## 2016-06-07 MED ORDER — FAMOTIDINE IN NACL 20-0.9 MG/50ML-% IV SOLN
20.0000 mg | INTRAVENOUS | Status: DC
  Administered 2016-06-07 – 2016-06-12 (×6): 20 mg via INTRAVENOUS
  Filled 2016-06-07 (×7): qty 50

## 2016-06-07 MED ORDER — SODIUM CHLORIDE 0.9 % IV BOLUS (SEPSIS)
500.0000 mL | Freq: Once | INTRAVENOUS | Status: AC
  Administered 2016-06-07: 500 mL via INTRAVENOUS

## 2016-06-07 MED ORDER — SODIUM BICARBONATE 8.4 % IV SOLN
INTRAVENOUS | Status: DC
  Administered 2016-06-07 – 2016-06-09 (×5): via INTRAVENOUS
  Filled 2016-06-07 (×10): qty 150

## 2016-06-07 MED ORDER — ASPIRIN 300 MG RE SUPP: 300.0000 mg | RECTAL | Status: AC

## 2016-06-07 MED ORDER — PIPERACILLIN-TAZOBACTAM 3.375 G IVPB 30 MIN
3.3750 g | Freq: Once | INTRAVENOUS | Status: AC
  Administered 2016-06-07: 3.375 g via INTRAVENOUS
  Filled 2016-06-07: qty 50

## 2016-06-07 MED ORDER — ORAL CARE MOUTH RINSE
15.0000 mL | Freq: Two times a day (BID) | OROMUCOSAL | Status: DC
  Administered 2016-06-08 (×3): 15 mL via OROMUCOSAL

## 2016-06-07 MED ORDER — DEXTROSE 50 % IV SOLN
1.0000 | Freq: Once | INTRAVENOUS | Status: AC
  Administered 2016-06-07: 50 mL via INTRAVENOUS
  Filled 2016-06-07: qty 50

## 2016-06-07 MED ORDER — ALBUTEROL SULFATE (2.5 MG/3ML) 0.083% IN NEBU
10.0000 mg | INHALATION_SOLUTION | Freq: Once | RESPIRATORY_TRACT | Status: AC
  Administered 2016-06-07: 10 mg via RESPIRATORY_TRACT
  Filled 2016-06-07: qty 12

## 2016-06-07 MED ORDER — SODIUM BICARBONATE 8.4 % IV SOLN
100.0000 meq | Freq: Once | INTRAVENOUS | Status: AC
  Administered 2016-06-07: 100 meq via INTRAVENOUS
  Filled 2016-06-07: qty 50

## 2016-06-07 MED ORDER — ASPIRIN 81 MG PO CHEW
324.0000 mg | CHEWABLE_TABLET | ORAL | Status: AC
  Administered 2016-06-07: 324 mg via ORAL
  Filled 2016-06-07: qty 4

## 2016-06-07 MED ORDER — CALCIUM GLUCONATE 10 % IV SOLN
1.0000 g | Freq: Once | INTRAVENOUS | Status: AC
  Administered 2016-06-07: 1 g via INTRAVENOUS
  Filled 2016-06-07: qty 10

## 2016-06-07 MED ORDER — SODIUM CHLORIDE 0.9 % IV SOLN
INTRAVENOUS | Status: DC
  Administered 2016-06-07 – 2016-06-09 (×4): via INTRAVENOUS

## 2016-06-07 MED ORDER — INSULIN ASPART 100 UNIT/ML IV SOLN
10.0000 [IU] | Freq: Once | INTRAVENOUS | Status: AC
  Administered 2016-06-07: 10 [IU] via INTRAVENOUS
  Filled 2016-06-07: qty 1

## 2016-06-07 MED ORDER — SODIUM POLYSTYRENE SULFONATE 15 GM/60ML PO SUSP
45.0000 g | Freq: Once | ORAL | Status: AC
Start: 2016-06-07 — End: 2016-06-07
  Administered 2016-06-07: 45 g via ORAL
  Filled 2016-06-07: qty 180

## 2016-06-07 NOTE — ED Notes (Signed)
Attempted report x1. 

## 2016-06-07 NOTE — Progress Notes (Signed)
Pharmacy Antibiotic Note  [name] is a 70 y.o. male admitted on 06/07/2016 with r/o sepsis.  Pharmacy has been consulted for Zosyn dosing. CC of generalized weakness x 2 days, found to be hyperkalmic on admit. CT scan revealed possible intra-abdominal abscesses.   Plan: Zosyn 3.375 g IV 30 min fusion x1 then 2.25 g IV q8h Monitor clinical status and renal function    Height: 5\' 9"  (175.3 cm) Weight: 180 lb (81.6 kg) IBW/kg (Calculated) : 70.7  Temp (24hrs), Avg:95.5 F (35.3 C), Min:95.5 F (35.3 C), Max:95.5 F (35.3 C)   Recent Labs Lab 06/07/16 1335 06/07/16 1347 06/07/16 1423 06/07/16 1708 06/07/16 1717  WBC 12.6*  --   --   --   --   CREATININE  --   --  11.31* 10.96*  --   LATICACIDVEN  --  1.92*  --   --  1.77    Estimated Creatinine Clearance: 6.2 mL/min (by C-G formula based on SCr of 10.96 mg/dL (H)).   SCr elevated from unknown baseline  No Known Allergies  Antimicrobials this admission:  Zosyn 10/24 >>  Dose adjustments this admission:  N/A  Microbiology results:  10/24 BCx: Sent 10/24 UCx: Sent  Thank you for allowing pharmacy to be a part of this patient's care.  Cheral Almas, PharmD Candidate 06/07/2016 8:12 PM

## 2016-06-07 NOTE — ED Provider Notes (Signed)
[facility] DEPT Provider Note   CSN: [id number] Arrival date & time: 06/07/16  1255   History   Chief Complaint Chief Complaint  Patient presents with  . Shortness of Breath    HPI [name] is a 70 y.o. male.   Illness  This is a new problem. The current episode started more than 1 week ago. The problem occurs constantly. The problem has been gradually worsening. Associated symptoms include abdominal pain. Pertinent negatives include no chest pain, no headaches and no shortness of breath.   Past Medical History:  Diagnosis Date  . Arthritis   . CHF (congestive heart failure) ([place])   . Chronic kidney disease, stage 3   . Colon obstruction    Sigmoid  . COPD (chronic obstructive pulmonary disease) ([place])   . Diverticulitis   . Erectile dysfunction   . Fistula    chronic enterocutaneous  . Gout   . Hypertension   . Inguinal hernia   . Osteoarthritis   . Perforated bowel ([place])    perforation of the cecum  . Peritonitis ([place])    There are no active problems to display for this patient.   Past Surgical History:  Procedure Laterality Date  . COLON SURGERY       Home Medications    Prior to Admission medications   Medication Sig Start Date End Date Taking? Authorizing Provider  albuterol (PROVENTIL) (2.5 MG/3ML) 0.083% nebulizer solution Take 2.5 mg by nebulization every 6 (six) hours as needed for wheezing or shortness of breath.    Historical Provider, MD  AMLODIPINE BESYLATE PO Take by mouth.    Historical Provider, MD  carvedilol (COREG) 25 MG tablet Take 25 mg by mouth 2 (two) times daily with a meal.    Historical Provider, MD  furosemide (LASIX) 40 MG tablet Take 40 mg by mouth.    Historical Provider, MD  HYDROcodone-acetaminophen (NORCO/VICODIN) 5-325 MG per tablet Take 1 tablet by mouth every 6 (six) hours as needed. 11/29/13   Billy Fischer, MD  lisinopril (PRINIVIL,ZESTRIL) 20 MG tablet Take 20 mg by mouth daily.    Historical Provider, MD    potassium chloride (KLOR-CON) 20 MEQ packet Take 20 mEq by mouth daily.    Historical Provider, MD  sildenafil (VIAGRA) 50 MG tablet Take 50 mg by mouth daily as needed for erectile dysfunction.    Historical Provider, MD  traMADol (ULTRAM) 50 MG tablet Take by mouth every 6 (six) hours as needed.    Historical Provider, MD   Family History No family history on file.  Social History Social History  Substance Use Topics  . Smoking status: Current Every Day Smoker  . Smokeless tobacco: Not on file  . Alcohol use No     Allergies   Review of patient's allergies indicates no known allergies.   Review of Systems Review of Systems  Unable to perform ROS: Other  Respiratory: Negative for shortness of breath.   Cardiovascular: Negative for chest pain.  Gastrointestinal: Positive for abdominal pain.  Neurological: Negative for headaches.   Physical Exam Updated Vital Signs BP 113/58 (BP Location: Right Arm)   Pulse 92   Temp (!) 95.5 F (35.3 C) (Rectal)   Resp 25   Ht 5\' 9"  (1.753 m)   Wt 81.6 kg   SpO2 100%   BMI 26.58 kg/m   Physical Exam  Constitutional:  Disheveled and ill appearing elderly male  HENT:  Head: Normocephalic.  Eyes: EOM are normal.  Cardiovascular: Normal  rate.   Pulmonary/Chest: No respiratory distress. He has no wheezes.  Tachypneic  Abdominal: He exhibits mass. He exhibits no distension. There is tenderness. There is no guarding.  Colostomy bag in place  Genitourinary:  Genitourinary Comments: Severely enlarged scrotum.   Right testicular fungating mass that is approximately 10 cm with induration  Musculoskeletal: He exhibits no deformity.  Neurological: He is alert.  Skin: Skin is warm. Capillary refill takes less than 2 seconds.  Nursing note and vitals reviewed.  ED Treatments / Results  Labs (all labs ordered are listed, but only abnormal results are displayed) Labs Reviewed  CBC WITH DIFFERENTIAL/PLATELET - Abnormal; Notable for  the following:       Result Value   WBC 12.6 (*)    Hemoglobin 12.5 (*)    MCV 75.2 (*)    MCH 23.1 (*)    RDW 21.8 (*)    Neutro Abs 11.2 (*)    Lymphs Abs 0.6 (*)    All other components within normal limits  COMPREHENSIVE METABOLIC PANEL - Abnormal; Notable for the following:    Sodium 134 (*)    Potassium >7.5 (*)    Chloride 115 (*)    CO2 8 (*)    Glucose, Bld 133 (*)    BUN 139 (*)    Creatinine, Ser 11.31 (*)    Total Protein 8.9 (*)    GFR calc non Af Amer 4 (*)    GFR calc Af Amer 5 (*)    All other components within normal limits  I-STAT CG4 LACTIC ACID, ED - Abnormal; Notable for the following:    Lactic Acid, Venous 1.92 (*)    All other components within normal limits  CULTURE, BLOOD (ROUTINE X 2)  CULTURE, BLOOD (ROUTINE X 2)  BASIC METABOLIC PANEL  POTASSIUM  POTASSIUM  POTASSIUM  BASIC METABOLIC PANEL  BASIC METABOLIC PANEL  BASIC METABOLIC PANEL  BASIC METABOLIC PANEL  I-STAT TROPOININ, ED  I-STAT CG4 LACTIC ACID, ED   EKG  EKG Interpretation  Date/Time:  Tuesday June 07 2016 15:34:04 EDT Ventricular Rate:  70 PR Interval:    QRS Duration: 180 QT Interval:  426 QTC Calculation: 460 R Axis:   -109 Text Interpretation:  Sinus rhythm Sinus pause Borderline prolonged PR interval Nonspecific IVCD with LAD Confirmed by RAY MD, DANIELLE [phone]) on 06/07/2016 3:45:38 PM      Radiology Ct Abdomen Pelvis Wo Contrast  Result Date: 06/07/2016 CLINICAL DATA:  Abdominal pain. Scrotal swelling. Evaluate for scrotal mass. EXAM: CT ABDOMEN AND PELVIS WITHOUT CONTRAST TECHNIQUE: Multidetector CT imaging of the abdomen and pelvis was performed following the standard protocol without IV contrast. COMPARISON:  02/11/2008 FINDINGS: Lower chest: Lung bases are clear. No effusions. Heart is normal size. - Hepatobiliary: No focal hepatic abnormality. Gallbladder unremarkable. Pancreas: No focal abnormality or ductal dilatation. Spleen: No focal abnormality.  Normal  size. Adrenals/Urinary Tract: Multiple hyperdense and hypodense lesions within the kidneys bilaterally, likely a combination of simple and complex/ hemorrhagic cysts. The largest is in the right no pole measuring 5.6 cm. This previously measured 3.3 cm. Cannot completely exclude that some of these could represent solid renal lesions. No hydronephrosis. 2.1 cm left adrenal nodule compared to 1.8 cm previously. Right adrenal gland and urinary bladder grossly unremarkable. Stomach/Bowel: Postoperative changes noted within the colon. Right lower quadrant ostomy present. There is Collie Siad is an layering fluid collection noted in the pelvis with surrounding suture lines. This presumably represents a dilated denervation segment of distal  bowel, but is difficult to exclude separate pelvic abscess. This measures up to 7.6 cm. Small bowel and stomach decompressed. Vascular/Lymphatic: Diffuse iliac and aortic calcifications. No aneurysm. No adenopathy. Reproductive: Very large bilateral hydroceles, left larger than right. Other: No free fluid or free air. Musculoskeletal: No acute bony abnormality or focal bone lesion. IMPRESSION: Changes of prior bowel surgery with right lower quadrant ostomy. There is a gas and fluid collection in the pelvis measuring 7.6 cm with surrounding suture. This is intimately associated with the sigmoid colon remnant and may represent a segment of these are patent dilated bowel, but I cannot completely exclude that this is an abscess adjacent to the Hartmann's pouch. Mixture of low-density and high-density lesions within the kidneys bilaterally, enlarging since prior study. The largest cyst in the midpole measuring 5.6 cm compared to 3.3 cm previously. While these may reflect a combination of simple and hemorrhagic/complex cysts, I cannot exclude solid renal lesions/ neoplasms. Recommend further evaluation with contrast-enhanced CT or MRI. Left adrenal nodule, slightly larger than prior study,  nonspecific. Aortoiliac atherosclerosis. Large bilateral hydroceles, left greater than right. Electronically Signed   By: Rolm Baptise M.D.   On: 06/07/2016 16:31   Dg Chest Port 1 View  Result Date: 06/07/2016 CLINICAL DATA:  70 year old male with shortness breath and weakness for 2 days. Initial encounter. EXAM: PORTABLE CHEST 1 VIEW COMPARISON:  03/21/2005. FINDINGS: Portable exam with multiple overlying leads. No infiltrate, congestive heart failure or pneumothorax. No plain film evidence of pulmonary malignancy. Heart size within normal limits. Calcified slightly tortuous aorta. Bilateral shoulder joint degenerative changes. IMPRESSION: No acute abnormality. Aortic atherosclerosis. Electronically Signed   By: Genia Del M.D.   On: 06/07/2016 13:53   Procedures Procedures (including critical care time)  Medications Ordered in ED Medications  sodium chloride 0.9 % bolus 500 mL (not administered)  sodium bicarbonate injection 100 mEq (not administered)  sodium bicarbonate 150 mEq in dextrose 5 % 1,000 mL infusion (not administered)  sodium polystyrene (KAYEXALATE) 15 GM/60ML suspension 45 g (not administered)  sodium chloride 0.9 % bolus 1,000 mL (1,000 mLs Intravenous New Bag/Given 06/07/16 1452)  sodium chloride 0.9 % bolus 1,000 mL (1,000 mLs Intravenous New Bag/Given 06/07/16 1559)  calcium gluconate inj 10% (1 g) URGENT USE ONLY! (1 g Intravenous Given 06/07/16 1554)  albuterol (PROVENTIL) (2.5 MG/3ML) 0.083% nebulizer solution 10 mg (10 mg Nebulization Given 06/07/16 1549)  insulin aspart (novoLOG) injection 10 Units (10 Units Intravenous Given 06/07/16 1553)  dextrose 50 % solution 50 mL (50 mLs Intravenous Given 06/07/16 1554)  piperacillin-tazobactam (ZOSYN) IVPB 3.375 g (0 g Intravenous Stopped 06/07/16 1639)   Initial Impression / Assessment and Plan / ED Course  I have reviewed the triage vital signs and the nursing notes.  Pertinent labs & imaging results that were  available during my care of the patient were reviewed by me and considered in my medical decision making (see chart for details).  Clinical Course    Patient is a 70 y.o male who is a very poor historian who presents to the ED with "feeling terrible"." Patient states that he has been feeling "bad for a few days" but is unable to give additional history. He is here with his daughter but she has limited knowledge of his day to day life.   Patient's physical exam is consistent with abdominal pain and discomfort without rebound and concerning large right testicular mass.    ARF noted with creatinine of 11, BUN of 139, hyperkalemia at  7.5.   EKG shows IVCD.  Calcium gluconate given, insulin with d50 and albuterol for temporization.   Nephrology consulted for emergent dialysis and will come down to evaluate patient in ED.   At this time, depending on response to bicarb drip, patient may need hemodialysis.   Hypothermic with leukocytosis concern for sepsis. Zosyn given for ABX coverage, possibly intraabdominal pathology?   CT shows possible abscess as well as bilateral hydroceles.   Given multiple comorbidities, hypothermia, lactic acidosis and concerns for sepsis with ARF and hyperkalemia, I believe patient to be better served in ICU setting.   I discussed this with E-link who will be consulted ICU team for admission.   Patient may need surgical evaluation for possible abdominal abscess that is noted by radiology but at this time do not believe that he warrants emergent surgical consultation due to his hyperkalemia and electrolyte disturbance. I would also recommend ultrasound of testicles and urology consult.    Final Clinical Impressions(s) / ED Diagnoses   Final diagnoses:  Scrotal mass  Sepsis, due to unspecified organism [facility])  Acute hyperkalemia  AKI (acute kidney injury) [facility])    New Prescriptions New Prescriptions   No medications on file     Roberto Scales, MD 06/07/16  1712    Pattricia Boss, MD 06/08/16 1057

## 2016-06-07 NOTE — ED Triage Notes (Signed)
Pt BIB [facility] for evaluation of generalized weakness x 2 days. Daughter reports pt. Appetite decreased x 2 days. Pt. States he ate some yesterday, does have colostomy bag. EMS noted pt. O2 sats in low 80's on RA. Pt. With hx of COPD/CHF, pt.denies SOB or CP. Pt. Did not want to be transported initially. Pt. Alert to self and situation.

## 2016-06-07 NOTE — H&P (Signed)
PULMONARY / CRITICAL CARE MEDICINE   Name: [name] MRN: [id number] DOB: [birth year]    ADMISSION DATE:  06/07/2016 CONSULTATION DATE:  06/07/2016  REFERRING MD:  Dr. Winfred Leeds EDP  CHIEF COMPLAINT:  Hyperkalemic  HISTORY OF PRESENT ILLNESS:  Patient is encephalopathic and/or intubated. Therefore history has been obtained from chart review.  70 year old male with PMH as below which is significant for CHF, CKD III, COPD, diverticulitis with perforation and now ostomy since 2009, and arthritis. Per family he has been feeling unwell for probably about a week, but notably on 10/22 he started getting more lethargic. No specific complaints, just "didn't feel well". 10/24 when daughter got to his house he was fairly lethargic so she called EMS and he was brought to the ED where laboratory evaluation significant for K >7.5, CO2 8, Creatinine 11.31. He was temporized and K fell to 6.9. He was seen by nephrology who recommended bicarb and kayexalate and potentially HD if not improving. New fungating scrotal/testicular mass identified in ED. CT abdomen and pelvis demonstrated possible intraabdominal abscess. PCCM asked to admit.   PAST MEDICAL HISTORY :  He  has a past medical history of Arthritis; CHF (congestive heart failure) ([place]); Chronic kidney disease, stage 3; Colon obstruction; COPD (chronic obstructive pulmonary disease) ([place]); Diverticulitis; Erectile dysfunction; Fistula; Gout; Hypertension; Inguinal hernia; Osteoarthritis; Perforated bowel ([place]); and Peritonitis ([place]).  PAST SURGICAL HISTORY: He  has a past surgical history that includes Colon surgery.  No Known Allergies  No current facility-administered medications on file prior to encounter.    Current Outpatient Prescriptions on File Prior to Encounter  Medication Sig  . albuterol (PROVENTIL) (2.5 MG/3ML) 0.083% nebulizer solution Take 2.5 mg by nebulization every 6 (six) hours as needed for wheezing or shortness of breath.   . AMLODIPINE BESYLATE PO Take by mouth.  . carvedilol (COREG) 25 MG tablet Take 25 mg by mouth 2 (two) times daily with a meal.  . furosemide (LASIX) 40 MG tablet Take 40 mg by mouth.  Marland Kitchen HYDROcodone-acetaminophen (NORCO/VICODIN) 5-325 MG per tablet Take 1 tablet by mouth every 6 (six) hours as needed.  Marland Kitchen lisinopril (PRINIVIL,ZESTRIL) 20 MG tablet Take 20 mg by mouth daily.  . potassium chloride (KLOR-CON) 20 MEQ packet Take 20 mEq by mouth daily.  . sildenafil (VIAGRA) 50 MG tablet Take 50 mg by mouth daily as needed for erectile dysfunction.  . traMADol (ULTRAM) 50 MG tablet Take by mouth every 6 (six) hours as needed.    FAMILY HISTORY:  His has no family status information on file.    SOCIAL HISTORY: He  reports that he has been smoking.  He does not have any smokeless tobacco history on file. He reports that he does not drink alcohol.  REVIEW OF SYSTEMS:  Limited due to encephalopathy Bolds are positive  Constitutional: weight loss, gain, night sweats, Fevers, chills, fatigue .  HEENT: headaches, Sore throat, sneezing, nasal congestion, post nasal drip, Difficulty swallowing, Tooth/dental problems, visual complaints visual changes, ear ache CV:  chest pain, radiates: ,Orthopnea, PND, swelling in lower extremities, dizziness, palpitations, syncope.  GI  heartburn, indigestion, abdominal pain, nausea, vomiting, diarrhea, change in bowel habits, loss of appetite, bloody stools.  Resp: cough, productive: , hemoptysis, dyspnea, chest pain, pleuritic.  Skin: rash or itching or icterus GU: dysuria, change in color of urine, urgency or frequency. flank pain, hematuria  MS: joint pain or swelling. decreased range of motion  Psych: change in mood or affect. depression or anxiety.  Neuro: difficulty with speech, weakness, numbness, ataxia    SUBJECTIVE:    VITAL SIGNS: BP 108/62   Pulse 93   Temp (!) 95.5 F (35.3 C) (Rectal)   Resp 24   Ht 5\' 9"  (1.753 m)   Wt 81.6 kg (180 lb)    SpO2 92%   BMI 26.58 kg/m   HEMODYNAMICS:    VENTILATOR SETTINGS:    INTAKE / OUTPUT: No intake/output data recorded.  PHYSICAL EXAMINATION: General:  Chronically ill appearing male in NAD Neuro:  Alert, oriented to self, non-focal HEENT:  [place]/AT. PERRL Cardiovascular:  Tachy regular Lungs:  Clear Abdomen:  Soft, non-tender, non-distended. Fungating testicular mass. Musculoskeletal:  No acute deformity or ROM limitation.  Skin:  Grossly intact  LABS:  BMET  Recent Labs Lab 06/07/16 1423 06/07/16 1708  NA 134* 135  K >7.5* 6.9*  CL 115* 118*  CO2 8* <7*  BUN 139* 138*  CREATININE 11.31* 10.96*  GLUCOSE 133* 119*    Electrolytes  Recent Labs Lab 06/07/16 1423 06/07/16 1708  CALCIUM 9.9 9.7    CBC  Recent Labs Lab 06/07/16 1335  WBC 12.6*  HGB 12.5*  HCT 40.7  PLT 286    Coag's No results for input(s): APTT, INR in the last 168 hours.  Sepsis Markers  Recent Labs Lab 06/07/16 1347 06/07/16 1717  LATICACIDVEN 1.92* 1.77    ABG No results for input(s): PHART, PCO2ART, PO2ART in the last 168 hours.  Liver Enzymes  Recent Labs Lab 06/07/16 1423  AST 17  ALT 31  ALKPHOS 103  BILITOT 0.7  ALBUMIN 3.5    Cardiac Enzymes No results for input(s): TROPONINI, PROBNP in the last 168 hours.  Glucose No results for input(s): GLUCAP in the last 168 hours.  Imaging Ct Abdomen Pelvis Wo Contrast  Result Date: 06/07/2016 CLINICAL DATA:  Abdominal pain. Scrotal swelling. Evaluate for scrotal mass. EXAM: CT ABDOMEN AND PELVIS WITHOUT CONTRAST TECHNIQUE: Multidetector CT imaging of the abdomen and pelvis was performed following the standard protocol without IV contrast. COMPARISON:  02/11/2008 FINDINGS: Lower chest: Lung bases are clear. No effusions. Heart is normal size. - Hepatobiliary: No focal hepatic abnormality. Gallbladder unremarkable. Pancreas: No focal abnormality or ductal dilatation. Spleen: No focal abnormality.  Normal size.  Adrenals/Urinary Tract: Multiple hyperdense and hypodense lesions within the kidneys bilaterally, likely a combination of simple and complex/ hemorrhagic cysts. The largest is in the right no pole measuring 5.6 cm. This previously measured 3.3 cm. Cannot completely exclude that some of these could represent solid renal lesions. No hydronephrosis. 2.1 cm left adrenal nodule compared to 1.8 cm previously. Right adrenal gland and urinary bladder grossly unremarkable. Stomach/Bowel: Postoperative changes noted within the colon. Right lower quadrant ostomy present. There is Collie Siad is an layering fluid collection noted in the pelvis with surrounding suture lines. This presumably represents a dilated denervation segment of distal bowel, but is difficult to exclude separate pelvic abscess. This measures up to 7.6 cm. Small bowel and stomach decompressed. Vascular/Lymphatic: Diffuse iliac and aortic calcifications. No aneurysm. No adenopathy. Reproductive: Very large bilateral hydroceles, left larger than right. Other: No free fluid or free air. Musculoskeletal: No acute bony abnormality or focal bone lesion. IMPRESSION: Changes of prior bowel surgery with right lower quadrant ostomy. There is a gas and fluid collection in the pelvis measuring 7.6 cm with surrounding suture. This is intimately associated with the sigmoid colon remnant and may represent a segment of these are patent dilated bowel, but I cannot completely  exclude that this is an abscess adjacent to the Hartmann's pouch. Mixture of low-density and high-density lesions within the kidneys bilaterally, enlarging since prior study. The largest cyst in the midpole measuring 5.6 cm compared to 3.3 cm previously. While these may reflect a combination of simple and hemorrhagic/complex cysts, I cannot exclude solid renal lesions/ neoplasms. Recommend further evaluation with contrast-enhanced CT or MRI. Left adrenal nodule, slightly larger than prior study, nonspecific.  Aortoiliac atherosclerosis. Large bilateral hydroceles, left greater than right. Electronically Signed   By: Rolm Baptise M.D.   On: 06/07/2016 16:31   Dg Chest Port 1 View  Result Date: 06/07/2016 CLINICAL DATA:  70 year old male with shortness breath and weakness for 2 days. Initial encounter. EXAM: PORTABLE CHEST 1 VIEW COMPARISON:  03/21/2005. FINDINGS: Portable exam with multiple overlying leads. No infiltrate, congestive heart failure or pneumothorax. No plain film evidence of pulmonary malignancy. Heart size within normal limits. Calcified slightly tortuous aorta. Bilateral shoulder joint degenerative changes. IMPRESSION: No acute abnormality. Aortic atherosclerosis. Electronically Signed   By: Genia Del M.D.   On: 06/07/2016 13:53    STUDIES:  CT Abd/Pelvis 10/24 > Changes of prior bowel surgery with right lower quadrant ostomy. There is a gas and fluid collection in the pelvis measuring 7.6 cm with surrounding suture. This is intimately associated with the sigmoid colon remnant and may represent a segment of these are patent dilated bowel, but I cannot completely exclude that this is an abscess adjacent to the Hartmann's pouch. Mixture of low-density and high-density lesions within the kidneys bilaterally, enlarging since prior study. The largest cyst in the midpole measuring 5.6 cm compared to 3.3 cm previously. While these may reflect a combination of simple and hemorrhagic/complex cysts, I cannot exclude solid renal lesions/ neoplasms. Recommend further evaluation with contrast-enhanced CT or MRI. Left adrenal nodule, slightly larger than prior study, nonspecific. Aortoiliac atherosclerosis. Large bilateral hydroceles, left greater than right.  CULTURES: Blood Cx 10/24 > Urine 10/24 >  ANTIBIOTICS: Zosyn 10/24 >  SIGNIFICANT EVENTS:   LINES/TUBES:   DISCUSSION: 70 year old male presenting with 4 days lethargy. Found to be in acute on chronic renal failure and profoundly  hyperkalemia. Temporized in ED. Admit to ICU for bicarb, kayexalate, and maybe HD if not improving per renal.   ASSESSMENT / PLAN:  PULMONARY A: At risk for poor airway protection due to AMS COPD without acute exacerbation  P:   Intubation risk, monitor PRN albuterol  CARDIOVASCULAR A:  Hypertension H/o CHF  P:  Telemetry monitoring  RENAL A:   Acute kidney injury on CKD III >acute episode likely due to ACE-I, dehydration, lasix. Hyperkalemia, severe NAG acidosis  P:   Nephrology following Temporized in ED with medical management Bicarb infusion Kayexalate  If K fails to or is slow to improve will need HD Place foley  GASTROINTESTINAL/GENITOURINARY A:   H/o  Bowel perf now with ostomy Testicular/scrotal mass Bilateral hydrocele  P:   Keep NPO for now Pepcid for SUP  HEMATOLOGIC A:    P:  Follow CBC Heparin subcutaneous for VTE ppx Will need surgical  INFECTIOUS A:   Possible intraabdominal abscess.   P:   Zosyn Surgical consult once stabilized  ENDOCRINE A:   No acute issues    P:     NEUROLOGIC A:   Acute metabolic encephalopathy in setting of uremia.   P:   RASS goal: 0 to -1 Monitor   FAMILY  - Updates: daughters updated by [facility] in ED  -  Inter-disciplinary family meet or Palliative Care meeting due by:  10/31   Georgann Housekeeper, AGACNP-BC [place] [facility] Pager [phone] or [phone] 06/07/2016 6:40 PM   Attending Note:  I have examined patient, reviewed labs, studies and notes. I have discussed the case with Jaclynn Guarneri, and I agree with the data and plans as amended above. 70 yo man with hx sCHF, CKD III, COPD diverticular dz s/p perf and colostomy. Brought to ED with lethargy, altered MS. Per report had been ill and taking poor PO at home. Noted to have acute on chronic renal failure with associated hyperkalemia. Also found to have B hydrocele's with B scrotal skin breakdown, possible R scrotal mass. CT  abd identifies area of sigmoid remnant versus abscess adjacent to Hartman's pouch. On my eval he is normotensive, sleepy but wakes to voice easily. Interacts appropriately. He has 100cc urine in foley bag (new). He has B hydroceles, L > R with scrotal skin breakdown on both sides, more so on the R. There is some granulation tissue superiorly versus mass. I suspect that these are chronic changes due to skin breakdown, but will need to speak with urology and wound care, image further as they recommend. Will consult them once stabilized. We will continue empiric zosyn given possible intra-abd abscess. Will likely need to review films with CCS to determine whether this area reflects post-op anatomy versus infection. Suspect the former. Will continue IVF resuscitation and bicarb infusions at current rates. follow BMP and UOP. Hopefully he will avoid need for HD. Appreciate Dr Adin Hector assistance. Independent critical care time is 45 minutes.   Baltazar Apo, MD, PhD 06/07/2016, 10:36 PM [facility] [phone] or if no answer [phone]

## 2016-06-07 NOTE — Consult Note (Signed)
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  Pt can't clearly state if ostomy output up as well.  He cont on the aforementioned medications until today.    In ED w/u identified a scrotal mass, new.   CT A/P without obstructive renal issues.  Complex cysts noted.  pCXR without edema/effusions, no acute issues.    Has rec IV insulin / Dextrose, ALbuterol, CaCl.    EKG with IVCD, slightly prolonged PR interval. T waves ok.    At time of OV in 12/2015 Had Hb of 7, confirmed on repeat and prev was 12.  FOBT was +x3 and referred to GI but looks like pt never attended. Hb today is 12 (hemoconcentration?).  Creatinine, Ser (mg/dL)  Date Value  06/07/2016 11.31 (H)  ] I/Os:  ROS NSAIDS: none IV Contrast none TMP/SMX none Hypotension curently present Balance of 12 systems is negative w/ exceptions as above  PMH  Past Medical History:  Diagnosis Date  . Arthritis   . CHF (congestive heart failure) ([place])   . Chronic kidney disease, stage 3   . Colon obstruction    Sigmoid  . COPD (chronic obstructive pulmonary disease) ([place])   . Diverticulitis   . Erectile dysfunction   . Fistula    chronic enterocutaneous  . Gout   . Hypertension   . Inguinal hernia   . Osteoarthritis   . Perforated bowel ([place])    perforation of the cecum  . Peritonitis ([place])    [place]  Past Surgical History:  Procedure Laterality Date  . COLON SURGERY     FH No family history on file. SH  reports that he has been smoking.  He does not have any smokeless tobacco history on file. He reports that he does not drink  alcohol. His drug history is not on file. Allergies No Known Allergies Home medications Prior to Admission medications   Medication Sig Start Date End Date Taking? Authorizing Provider  albuterol (PROVENTIL) (2.5 MG/3ML) 0.083% nebulizer solution Take 2.5 mg by nebulization every 6 (six) hours as needed for wheezing or shortness of breath.    Historical Provider, MD  AMLODIPINE BESYLATE PO Take by mouth.    Historical Provider, MD  carvedilol (COREG) 25 MG tablet Take 25 mg by mouth 2 (two) times daily with a meal.    Historical Provider, MD  furosemide (LASIX) 40 MG tablet Take 40 mg by mouth.    Historical Provider, MD  HYDROcodone-acetaminophen (NORCO/VICODIN) 5-325 MG per tablet Take 1 tablet by mouth every 6 (six) hours as needed. 11/29/13   Billy Fischer, MD  lisinopril (PRINIVIL,ZESTRIL) 20 MG tablet Take 20 mg by mouth daily.    Historical Provider, MD  potassium chloride (KLOR-CON) 20 MEQ packet Take 20 mEq by mouth daily.    Historical Provider, MD  sildenafil (VIAGRA) 50 MG tablet Take 50 mg by mouth daily as needed for erectile dysfunction.    Historical Provider, MD  traMADol (ULTRAM) 50 MG tablet Take by mouth every 6 (six) hours as needed.    Historical Provider, MD    Current Medications Scheduled Meds: . sodium bicarbonate  100  mEq Intravenous Once   Continuous Infusions: .  sodium bicarbonate  infusion 1000 mL    . sodium chloride 1,000 mL (06/07/16 1559)  . sodium chloride     PRN Meds:.  CBC  Recent Labs Lab 06/07/16 1335  WBC 12.6*  NEUTROABS 11.2*  HGB 12.5*  HCT 40.7  MCV 75.2*  PLT [id number]   Basic Metabolic Panel  Recent Labs Lab 06/07/16 1423  NA 134*  K >7.5*  CL 115*  CO2 8*  GLUCOSE 133*  BUN 139*  CREATININE 11.31*  CALCIUM 9.9    Physical Exam  Blood pressure 113/58, pulse 92, temperature (!) 95.5 F (35.3 C), temperature source Rectal, resp. rate 25, height 5\' 9"  (1.753 m), weight 81.6 kg (180 lb), SpO2 100 %. GEN: dissheveled,  unkempt, lying in stretcher ENT: dry MM, poor dentition EYES: sunken, EOMI CV: RRR PULM: CTAB ABD: RLQ Ostomy with brown liquid stool SKIN: R scrotum with fungating mass EXT: No LEE   Assessment 26M AoCKD3, severe hyperkalemia (on ACEi, KCl, Lasix), NAG metabolic acidosis, scrotal mass, long-term ostomy, hypovolemia  1. AoCKD3 1. BL SCr 1.7 2. CT A/P 10/24 w/o obstruction 3. Likely 2/2 hypovolemia + ACEi + furosemide 2. Severe Hyperkalemia 2/2 Medications, low GFR, Acidosis 3. NAG Metabolic Acidosis 4. Scrotal Mass 5. Recent unexplained anemia with FOBT+ x3 6. HTN 7. CHF  Plan 1. 2 Amps NaHCO3 followed by NaHCO3 156mEq in D5W @ 125/h 2. Kayexalate 45mg  now 3. Stat BMP now 4. HD if not improving with temporizing measures 5. Hold ACEi, KCL, Lasix 6. Daily weights, Daily Renal Panel, Strict I/Os, Avoid nephrotoxins (NSAIDs, judicious IV Contrast)    Pearson Grippe MD [phone] pgr 06/07/2016, 4:44 PM

## 2016-06-08 ENCOUNTER — Inpatient Hospital Stay (HOSPITAL_COMMUNITY): Payer: Medicare Other

## 2016-06-08 DIAGNOSIS — N5089 Other specified disorders of the male genital organs: Secondary | ICD-10-CM

## 2016-06-08 DIAGNOSIS — I429 Cardiomyopathy, unspecified: Secondary | ICD-10-CM

## 2016-06-08 DIAGNOSIS — G9340 Encephalopathy, unspecified: Secondary | ICD-10-CM

## 2016-06-08 DIAGNOSIS — Z978 Presence of other specified devices: Secondary | ICD-10-CM

## 2016-06-08 DIAGNOSIS — A419 Sepsis, unspecified organism: Secondary | ICD-10-CM

## 2016-06-08 DIAGNOSIS — I472 Ventricular tachycardia: Secondary | ICD-10-CM

## 2016-06-08 DIAGNOSIS — N509 Disorder of male genital organs, unspecified: Secondary | ICD-10-CM

## 2016-06-08 DIAGNOSIS — N179 Acute kidney failure, unspecified: Secondary | ICD-10-CM

## 2016-06-08 DIAGNOSIS — J9601 Acute respiratory failure with hypoxia: Secondary | ICD-10-CM

## 2016-06-08 DIAGNOSIS — L8990 Pressure ulcer of unspecified site, unspecified stage: Secondary | ICD-10-CM | POA: Insufficient documentation

## 2016-06-08 DIAGNOSIS — I493 Ventricular premature depolarization: Secondary | ICD-10-CM

## 2016-06-08 DIAGNOSIS — I479 Paroxysmal tachycardia, unspecified: Secondary | ICD-10-CM

## 2016-06-08 LAB — BASIC METABOLIC PANEL
ANION GAP: 11 (ref 5–15)
ANION GAP: 10 (ref 5–15)
Anion gap: 13 (ref 5–15)
Anion gap: 9 (ref 5–15)
BUN: 110 mg/dL — ABNORMAL HIGH (ref 6–20)
BUN: 102 mg/dL — ABNORMAL HIGH (ref 6–20)
BUN: 118 mg/dL — AB (ref 6–20)
BUN: 124 mg/dL — AB (ref 6–20)
CALCIUM: 8.2 mg/dL — AB (ref 8.9–10.3)
CALCIUM: 8.2 mg/dL — AB (ref 8.9–10.3)
CALCIUM: 8.7 mg/dL — AB (ref 8.9–10.3)
CHLORIDE: 115 mmol/L — AB (ref 101–111)
CHLORIDE: 121 mmol/L — AB (ref 101–111)
CHLORIDE: 122 mmol/L — AB (ref 101–111)
CO2: 14 mmol/L — ABNORMAL LOW (ref 22–32)
CO2: 13 mmol/L — AB (ref 22–32)
CO2: 17 mmol/L — ABNORMAL LOW (ref 22–32)
CO2: 9 mmol/L — AB (ref 22–32)
CREATININE: 7.18 mg/dL — AB (ref 0.61–1.24)
CREATININE: 8.51 mg/dL — AB (ref 0.61–1.24)
CREATININE: 8.99 mg/dL — AB (ref 0.61–1.24)
Calcium: 8.2 mg/dL — ABNORMAL LOW (ref 8.9–10.3)
Chloride: 119 mmol/L — ABNORMAL HIGH (ref 101–111)
Creatinine, Ser: 7.88 mg/dL — ABNORMAL HIGH (ref 0.61–1.24)
GFR calc non Af Amer: 5 mL/min — ABNORMAL LOW (ref >60)
GFR calc non Af Amer: 6 mL/min — ABNORMAL LOW (ref >60)
GFR calc non Af Amer: 7 mL/min — ABNORMAL LOW (ref >60)
GFR, EST AFRICAN AMERICAN: 8 mL/min — AB (ref >60)
GFR, EST AFRICAN AMERICAN: 6 mL/min — AB (ref >60)
GFR, EST AFRICAN AMERICAN: 6 mL/min — AB (ref >60)
GFR, EST AFRICAN AMERICAN: 7 mL/min — AB (ref >60)
GFR, EST NON AFRICAN AMERICAN: 6 mL/min — AB (ref >60)
GLUCOSE: 142 mg/dL — AB (ref 65–99)
Glucose, Bld: 123 mg/dL — ABNORMAL HIGH (ref 65–99)
Glucose, Bld: 116 mg/dL — ABNORMAL HIGH (ref 65–99)
Glucose, Bld: 167 mg/dL — ABNORMAL HIGH (ref 65–99)
POTASSIUM: 4.8 mmol/L (ref 3.5–5.1)
Potassium: 5.3 mmol/L — ABNORMAL HIGH (ref 3.5–5.1)
Potassium: 4.8 mmol/L (ref 3.5–5.1)
Potassium: 4.5 mmol/L (ref 3.5–5.1)
SODIUM: 142 mmol/L (ref 135–145)
SODIUM: 143 mmol/L (ref 135–145)
Sodium: 144 mmol/L (ref 135–145)
Sodium: 144 mmol/L (ref 135–145)

## 2016-06-08 LAB — POCT I-STAT 3, ART BLOOD GAS (G3+)
Acid-base deficit: 12 mmol/L — ABNORMAL HIGH (ref 0.0–2.0)
BICARBONATE: 12.9 mmol/L — AB (ref 20.0–28.0)
O2 SAT: 100 %
PCO2 ART: 25.9 mmHg — AB (ref 32.0–48.0)
PO2 ART: 201 mmHg — AB (ref 83.0–108.0)
Patient temperature: 98.6
TCO2: 14 mmol/L (ref 0–100)
pH, Arterial: 7.304 — ABNORMAL LOW (ref 7.350–7.450)

## 2016-06-08 LAB — GLUCOSE, CAPILLARY
GLUCOSE-CAPILLARY: 128 mg/dL — AB (ref 65–99)
GLUCOSE-CAPILLARY: 205 mg/dL — AB (ref 65–99)
Glucose-Capillary: 129 mg/dL — ABNORMAL HIGH (ref 65–99)

## 2016-06-08 LAB — ECHOCARDIOGRAM COMPLETE
Height: 69 in
WEIGHTICAEL: 2592.61 [oz_av]

## 2016-06-08 LAB — TROPONIN I
TROPONIN I: 0.09 ng/mL — AB (ref <0.03)
TROPONIN I: 0.11 ng/mL — AB (ref <0.03)
TROPONIN I: 0.12 ng/mL — AB (ref <0.03)

## 2016-06-08 LAB — MAGNESIUM: Magnesium: 2 mg/dL (ref 1.7–2.4)

## 2016-06-08 LAB — CBC
HCT: 29.8 % — ABNORMAL LOW (ref 39.0–52.0)
HEMOGLOBIN: 9.2 g/dL — AB (ref 13.0–17.0)
MCH: 22.4 pg — ABNORMAL LOW (ref 26.0–34.0)
MCHC: 30.9 g/dL (ref 30.0–36.0)
MCV: 72.7 fL — ABNORMAL LOW (ref 78.0–100.0)
Platelets: 193 10*3/uL (ref 150–400)
RBC: 4.1 MIL/uL — ABNORMAL LOW (ref 4.22–5.81)
RDW: 21.2 % — AB (ref 11.5–15.5)
WBC: 8.1 10*3/uL (ref 4.0–10.5)

## 2016-06-08 MED ORDER — FENTANYL CITRATE (PF) 100 MCG/2ML IJ SOLN
50.0000 ug | Freq: Once | INTRAMUSCULAR | Status: AC
Start: 2016-06-08 — End: 2016-06-08
  Administered 2016-06-08: 50 ug via INTRAVENOUS

## 2016-06-08 MED ORDER — NOREPINEPHRINE BITARTRATE 1 MG/ML IV SOLN
0.0000 ug/min | INTRAVENOUS | Status: DC
  Administered 2016-06-08 – 2016-06-09 (×2): 20 ug/min via INTRAVENOUS
  Administered 2016-06-09: 26 ug/min via INTRAVENOUS
  Administered 2016-06-09: 12 ug/min via INTRAVENOUS
  Filled 2016-06-08 (×4): qty 16

## 2016-06-08 MED ORDER — FENTANYL CITRATE (PF) 100 MCG/2ML IJ SOLN
INTRAMUSCULAR | Status: AC
  Filled 2016-06-08: qty 2

## 2016-06-08 MED ORDER — MIDAZOLAM HCL 2 MG/2ML IJ SOLN
INTRAMUSCULAR | Status: AC
  Filled 2016-06-08: qty 2

## 2016-06-08 MED ORDER — AMIODARONE LOAD VIA INFUSION
150.0000 mg | Freq: Once | INTRAVENOUS | Status: AC
  Administered 2016-06-08: 150 mg via INTRAVENOUS
  Filled 2016-06-08: qty 83.34

## 2016-06-08 MED ORDER — CALCIUM CHLORIDE 10 % IV SOLN
1.0000 g | Freq: Once | INTRAVENOUS | Status: AC
  Administered 2016-06-08: 1 g via INTRAVENOUS
  Filled 2016-06-08: qty 10

## 2016-06-08 MED ORDER — AMIODARONE IV BOLUS ONLY 150 MG/100ML
150.0000 mg | Freq: Once | INTRAVENOUS | Status: AC
  Administered 2016-06-08: 150 mg via INTRAVENOUS

## 2016-06-08 MED ORDER — MAGNESIUM SULFATE 2 GM/50ML IV SOLN
2.0000 g | Freq: Once | INTRAVENOUS | Status: AC
  Administered 2016-06-08: 2 g via INTRAVENOUS
  Filled 2016-06-08: qty 50

## 2016-06-08 MED ORDER — FENTANYL BOLUS VIA INFUSION
25.0000 ug | INTRAVENOUS | Status: DC | PRN
  Filled 2016-06-08: qty 25

## 2016-06-08 MED ORDER — MIDAZOLAM HCL 2 MG/2ML IJ SOLN
1.0000 mg | INTRAMUSCULAR | Status: DC | PRN
  Administered 2016-06-11 (×2): 1 mg via INTRAVENOUS
  Filled 2016-06-08 (×3): qty 2

## 2016-06-08 MED ORDER — ETOMIDATE 2 MG/ML IV SOLN
0.3000 mg/kg | Freq: Once | INTRAVENOUS | Status: AC
  Administered 2016-06-08: 20 mg via INTRAVENOUS

## 2016-06-08 MED ORDER — PHENYLEPHRINE HCL 10 MG/ML IJ SOLN
0.0000 ug/min | INTRAVENOUS | Status: DC
  Administered 2016-06-08: 150 ug/min via INTRAVENOUS
  Filled 2016-06-08: qty 4

## 2016-06-08 MED ORDER — MIDAZOLAM HCL 2 MG/2ML IJ SOLN
1.0000 mg | Freq: Once | INTRAMUSCULAR | Status: AC
  Administered 2016-06-08: 1 mg via INTRAVENOUS

## 2016-06-08 MED ORDER — FENTANYL CITRATE (PF) 100 MCG/2ML IJ SOLN
100.0000 ug | Freq: Once | INTRAMUSCULAR | Status: AC
  Administered 2016-06-08: 100 ug via INTRAVENOUS

## 2016-06-08 MED ORDER — FENTANYL 2500MCG IN NS 250ML (10MCG/ML) PREMIX INFUSION
25.0000 ug/h | INTRAVENOUS | Status: DC
  Administered 2016-06-08 – 2016-06-09 (×2): 100 ug/h via INTRAVENOUS
  Administered 2016-06-10: 150 ug/h via INTRAVENOUS
  Administered 2016-06-11: 200 ug/h via INTRAVENOUS
  Filled 2016-06-08 (×4): qty 250

## 2016-06-08 MED ORDER — AMIODARONE HCL IN DEXTROSE 360-4.14 MG/200ML-% IV SOLN
15.0000 mg/h | INTRAVENOUS | Status: DC
  Administered 2016-06-08 – 2016-06-11 (×10): 30 mg/h via INTRAVENOUS
  Filled 2016-06-08 (×20): qty 200

## 2016-06-08 MED ORDER — LIDOCAINE IN D5W 4-5 MG/ML-% IV SOLN
2.0000 mg/min | INTRAVENOUS | Status: DC
  Administered 2016-06-08: 2 mg/min via INTRAVENOUS
  Filled 2016-06-08: qty 500

## 2016-06-08 MED ORDER — PHENYLEPHRINE HCL 10 MG/ML IJ SOLN
0.0000 ug/min | INTRAVENOUS | Status: DC
  Administered 2016-06-08: 70 ug/min via INTRAVENOUS
  Administered 2016-06-08: 20 ug/min via INTRAVENOUS
  Filled 2016-06-08 (×2): qty 1

## 2016-06-08 MED ORDER — LIDOCAINE BOLUS VIA INFUSION
75.0000 mg | Freq: Once | INTRAVENOUS | Status: AC
  Administered 2016-06-08: 75 mg via INTRAVENOUS
  Filled 2016-06-08: qty 76

## 2016-06-08 MED ORDER — MIDAZOLAM HCL 2 MG/2ML IJ SOLN
2.0000 mg | Freq: Once | INTRAMUSCULAR | Status: AC
  Administered 2016-06-08: 2 mg via INTRAVENOUS

## 2016-06-08 MED ORDER — SODIUM CHLORIDE 0.9 % IV BOLUS (SEPSIS)
1000.0000 mL | Freq: Once | INTRAVENOUS | Status: AC
  Administered 2016-06-08: 1000 mL via INTRAVENOUS

## 2016-06-08 MED ORDER — AMIODARONE HCL IN DEXTROSE 360-4.14 MG/200ML-% IV SOLN
60.0000 mg/h | INTRAVENOUS | Status: AC
  Administered 2016-06-08 (×2): 60 mg/h via INTRAVENOUS
  Filled 2016-06-08: qty 200

## 2016-06-08 MED ORDER — FENTANYL CITRATE (PF) 100 MCG/2ML IJ SOLN
50.0000 ug | Freq: Once | INTRAMUSCULAR | Status: AC
  Administered 2016-06-08: 50 ug via INTRAVENOUS
  Filled 2016-06-08: qty 2

## 2016-06-08 MED ORDER — ROCURONIUM BROMIDE 50 MG/5ML IV SOLN
1.0000 mg/kg | Freq: Once | INTRAVENOUS | Status: AC
  Administered 2016-06-08: 50 mg via INTRAVENOUS

## 2016-06-08 MED ORDER — MIDAZOLAM HCL 2 MG/2ML IJ SOLN
1.0000 mg | INTRAMUSCULAR | Status: DC | PRN
  Administered 2016-06-10: 1 mg via INTRAVENOUS
  Filled 2016-06-08: qty 2

## 2016-06-08 NOTE — Procedures (Signed)
Central Venous Catheter Insertion Procedure Note SHAQUELLE PARATORE [id number] 08-10-1946  Procedure: Insertion of Central Venous Catheter Indications: Assessment of intravascular volume, Drug and/or fluid administration and Frequent blood sampling  Procedure Details Consent: Risks of procedure as well as the alternatives and risks of each were explained to the (patient/caregiver).  Consent for procedure obtained.   Time Out: Verified patient identification, verified procedure, site/side was marked, verified correct patient position, special equipment/implants available, medications/allergies/relevent history reviewed, required imaging and test results available.  Performed  Maximum sterile technique was used including antiseptics, cap, gloves, gown, hand hygiene, mask and sheet. Skin prep: Chlorhexidine; local anesthetic administered  A antimicrobial bonded/coated triple lumen catheter was placed in the left internal jugular vein to 20 cm using the Seldinger technique, sutured in place.   Evaluation Blood flow good Complications: No apparent complications Patient tolerated procedure well. Chest X-ray ordered to verify placement.  CXR: pending.   Procedure performed under direct supervision of Dr. Nelda Marseille and with ultrasound guidance for real time vessel cannulation.      Noe Gens, NP-C [facility] Pgr: [phone] or if no answer [phone] 06/08/2016, 10:57 AM  Rush Farmer, M.D. [facility] Pulmonary/Critical Care Medicine. Pager: [phone]. After hours pager: [phone].

## 2016-06-08 NOTE — Progress Notes (Signed)
Interim Progress Note:   Ms. [name] from cardiology came to evaluate patient. Recommended repeat Amiodarone bolus 150mg  and monitor. Reports she will touch base with Dr. [place].   Smiley Houseman, MD PGY 2 Family Medicine

## 2016-06-08 NOTE — Procedures (Signed)
OGT Placement  OGT placed under direct laryngoscopy by me and verified by auscultation.  Rush Farmer, M.D. [facility] Pulmonary/Critical Care Medicine. Pager: [phone]. After hours pager: [phone].

## 2016-06-08 NOTE — Consult Note (Addendum)
[place] Nurse wound consult note Reason for Consult: Consult requested for right scrotum. Pt is unable to state the etiology of this wound, or how long it has been present.  Wound type: Full thickness; 10X10X1cm Wound bed: red with small amts clotted blood Drainage (amount, consistency, odor) mod amt brown drainage, strong odor Periwound: Generalized edema to scrotum; very painful Dressing procedure/placement/frequency: CT scan indicates: Large bilateral hydroceles, left greater than right. This is beyond [facility] scope of practice. Please consult urology for further plan of care. Xeroform gauze applied until further input available from surgery team.  [place] Nurse ostomy consult note Stoma type/location: Ileostomy has been in place since 2006, according to EMR. Stomal assessment/size: Stoma red and viable, above skin level, 1 inch. Peristomal assessment: Intact skin surrounding Output: Mod amt unformed tan stool in pouch  Ostomy pouching: 1pc. Education provided:  Applied one piece pouch and supplies orderd for staff nurse use. Please re-consult if further assistance is needed.  Thank-you,  Julien Girt MSN, [place], [place], [place], [place]

## 2016-06-08 NOTE — Progress Notes (Signed)
eLink Physician-Brief Progress Note Patient Name: [name] DOB: 10-18-1945 MRN: [id number]   Date of Service  06/08/2016  HPI/Events of Note  Patient had 36 second run of NSVT after Amiodarone IV bolus and on Amiodoen IV infusion for about 45 minutes.   eICU Interventions  Will order: 1. Lidocaine 75 mg IV bolus. 2. Lidocaine IV infusion at 2 mg/min.      Intervention Category Major Interventions: Arrhythmia - evaluation and management  Kyon Bentler [place] 06/08/2016, 4:31 AM

## 2016-06-08 NOTE — Progress Notes (Signed)
Admit: 06/07/2016 LOS: 1  62M AoCKD3, severe hyperkalemia (on ACEi, KCl, Lasix), NAG metabolic acidosis, scrotal mass, long-term ostomy, hypovolemia  Subjective:  Hypotensive overnight, on PE NSVT req Amio/Lidocaine On Zosyn for ? abd abscess K improved, BUN/SCr somewhat improved  Awake alert this AM, denies pain Kayexalate  Given, inc ostomy output as expected  10/24 0701 - 10/25 0700 In: 3100.4 [I.V.:3050.4; IV Piggyback:50] Out: [id number] [Urine:560; Stool:1250]  Filed Weights   06/07/16 1555 06/08/16 0500  Weight: 81.6 kg (180 lb) 73.5 kg (162 lb 0.6 oz)    Scheduled Meds: . famotidine (PEPCID) IV  20 mg Intravenous Q24H  . heparin  5,000 Units Subcutaneous Q8H  . mouth rinse  15 mL Mouth Rinse BID  . piperacillin-tazobactam (ZOSYN)  IV  2.25 g Intravenous Q8H   Continuous Infusions: . sodium chloride 100 mL/hr at 06/07/16 1951  . amiodarone 60 mg/hr (06/08/16 0547)   Followed by  . amiodarone 30 mg/hr (06/08/16 [id number])  . lidocaine Stopped (06/08/16 0801)  . phenylephrine (NEO-SYNEPHRINE) Adult infusion 80 mcg/min (06/08/16 0631)  .  sodium bicarbonate  infusion 1000 mL 125 mL/hr at 06/08/16 0200   PRN Meds:.sodium chloride  Current Labs: reviewed    Physical Exam:  Blood pressure 120/60, pulse 71, temperature 98.4 F (36.9 C), temperature source Oral, resp. rate 19, height 5\' 9"  (1.753 m), weight 73.5 kg (162 lb 0.6 oz), SpO2 100 %. GEN: dissheveled, unkempt, lying in stretcher ENT: dry MM, poor dentition EYES: sunken, EOMI CV: RRR PULM: CTAB ABD: RLQ Ostomy with brown liquid stool SKIN: R scrotum with fungating mass EXT: No LEE  A 1. AoCKD3 1. BL SCr 1.7 2. CT A/P 10/24 w/o obstruction 3. Likely 2/2 hypovolemia + ACEi + furosemide 2. Severe Hyperkalemia 2/2 Medications, low GFR, Acidosis; resolved 3. NAG Metabolic Acidosis; improving with HCO3 replacmement 4. Scrotal Mass 5. Recent unexplained anemia as outpatient with FOBT+ x3 6. HTN 7. CHF 8. NSVT on  Amio/Lidocaine 9. Hypotension / Shock; Septic; on PE  P 1. Cont NaHCO3 infusion, BP Support 2. BID BMP 3. Still might req RRT for peristent renal failure, but is nonoliguric and labs improving 4. Daily weights, Daily Renal Panel, Strict I/Os, Avoid nephrotoxins (NSAIDs, judicious IV Contrast)    Pearson Grippe MD 06/08/2016, 8:13 AM   Recent Labs Lab 06/07/16 2113 06/08/16 0018 06/08/16 0319  NA 141 143 144  K 5.6* 5.3* 4.8  CL 121* 121* 122*  CO2 9* 9* 13*  GLUCOSE 127* 123* 116*  BUN 127* 124* 118*  CREATININE 9.49* 8.99* 8.51*  CALCIUM 8.9 8.7* 8.2*    Recent Labs Lab 06/07/16 1335  WBC 12.6*  NEUTROABS 11.2*  HGB 12.5*  HCT 40.7  MCV 75.2*  PLT 286

## 2016-06-08 NOTE — Progress Notes (Signed)
Pt went into [facility] rhythm, code blue initiated, pt asleep upon RN's arrival but awoke to voice.  He denies any chest pain and stated "what are yall doing in here" while still in [facility] rythym . After 38 seconds of VTACH pt returned to sinus rhythm.  Code Blue cancelled. ELink notified, new orders received.

## 2016-06-08 NOTE — Consult Note (Addendum)
Urology Consult   Physician requesting consult: Dr. Nelda Marseille  Reason for consult: Scrotal mass  History of Present Illness: [name] is a 70 y.o. who was admitted to the hospital after being found to be lethargic by his daughter on 10/24.  EMS was called and he was brought to the emergency department were found to be in acute renal failure with a serum creatinine over 11.  He underwent a CT scan that demonstrated a possible intra-abdominal abscess.  He was also noted to have a large scrotal skin mass.  Overnight, he developed a sustained run of ventricular tachycardia.  He subsequently has developed hypotension/shock and required intubation this morning.  His past medical history significant for CHF, chronic kidney disease stage III, COPD, diverticulitis with perforation and subsequent ileostomy.  Prior to admission to the hospital, he apparently was living independently according to his daughter.  I am unable to obtain history from the patient.  According to his daughter, she was not aware that he had a scrotal mass.  His CT scan did demonstrate what appears to be findings consistent with bilateral hydroceles.   Past Medical History:  Diagnosis Date  . Arthritis   . CHF (congestive heart failure) ([place])   . Chronic kidney disease, stage 3   . Colon obstruction    Sigmoid  . COPD (chronic obstructive pulmonary disease) ([place])   . Diverticulitis   . Erectile dysfunction   . Fistula    chronic enterocutaneous  . Gout   . Hypertension   . Inguinal hernia   . Osteoarthritis   . Perforated bowel ([place])    perforation of the cecum  . Peritonitis [facility])     Past Surgical History:  Procedure Laterality Date  . COLON SURGERY      Current Hospital Medications:  Meds:   Scheduled Meds: . famotidine (PEPCID) IV  20 mg Intravenous Q24H  . heparin  5,000 Units Subcutaneous Q8H  . mouth rinse  15 mL Mouth Rinse BID  . piperacillin-tazobactam (ZOSYN)  IV  2.25 g Intravenous Q8H    Continuous Infusions: . sodium chloride 100 mL/hr at 06/07/16 1951  . amiodarone 30 mg/hr (06/08/16 1358)  . fentaNYL infusion INTRAVENOUS 100 mcg/hr (06/08/16 1545)  . norepinephrine (LEVOPHED) Adult infusion 20 mcg/min (06/08/16 1633)  .  sodium bicarbonate  infusion 1000 mL 125 mL/hr at 06/08/16 1040   PRN Meds:.sodium chloride, fentaNYL, midazolam, midazolam  Allergies: No Known Allergies  No family history on file.  Social History:  reports that he has been smoking.  He does not have any smokeless tobacco history on file. He reports that he does not drink alcohol. His drug history is not on file.  ROS: A complete review of systems was performed.  All systems are negative except for pertinent findings as noted.  Physical Exam:  Vital signs in last 24 hours: Temp:  [97.9 F (36.6 C)-99.3 F (37.4 C)] 97.9 F (36.6 C) (10/25 1624) Pulse Rate:  [31-122] 51 (10/25 1645) Resp:  [0-37] 18 (10/25 1700) BP: (67-144)/(44-90) 97/65 (10/25 1700) SpO2:  [83 %-100 %] 100 % (10/25 1645) FiO2 (%):  [50 %] 50 % (10/25 1528) Weight:  [73.5 kg (162 lb 0.6 oz)] 73.5 kg (162 lb 0.6 oz) (10/25 0500) Constitutional:  Intubated and sedated. Cardiovascular: Regular rate and rhythm Respiratory: Lungs clear bilaterally GI: Abdomen is soft, nontender, nondistended. 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 GU: He has a large, fungating7-8 cm round mass extending from the right lateral aspect of  the scrotum into the inguinal region.  This is consistent with a giant condyloma.  He also has a small condyloma noted on the left hemiscrotum. He appears to have bilateral hydroceles.  I can palpate his right testis was palpably appears normal without masses.  His left-sided hydrocele was tense and I cannot palpate his left testis. Lymphatic: No lymphadenopathy of the groin. Neurologic: Unable to assess due to sedation. Psychiatric: Sedated.  Laboratory Data:   Recent Labs  06/07/16 1335  06/08/16 0754  WBC 12.6* 8.1  HGB 12.5* 9.2*  HCT 40.7 29.8*  PLT 286 193     Recent Labs  06/07/16 2113 06/08/16 0018 06/08/16 0319 06/08/16 0754 06/08/16 1453  NA 141 143 144 144 142  K 5.6* 5.3* 4.8 4.8 4.5  CL 121* 121* 122* 119* 115*  GLUCOSE 127* 123* 116* 142* 167*  BUN 127* 124* 118* 110* 102*  CALCIUM 8.9 8.7* 8.2* 8.2* 8.2*  CREATININE 9.49* 8.99* 8.51* 7.88* 7.18*     Results for orders placed or performed during the hospital encounter of 06/07/16 (from the past 24 hour(s))  MRSA PCR Screening     Status: None   Collection Time: 06/07/16  8:33 PM  Result Value Ref Range   MRSA by PCR NEGATIVE NEGATIVE  Glucose, capillary     Status: Abnormal   Collection Time: 06/07/16  9:07 PM  Result Value Ref Range   Glucose-Capillary 111 (H) 65 - 99 mg/dL   Comment 1 Notify RN   Basic metabolic panel     Status: Abnormal   Collection Time: 06/07/16  9:13 PM  Result Value Ref Range   Sodium 141 135 - 145 mmol/L   Potassium 5.6 (H) 3.5 - 5.1 mmol/L   Chloride 121 (H) 101 - 111 mmol/L   CO2 9 (L) 22 - 32 mmol/L   Glucose, Bld 127 (H) 65 - 99 mg/dL   BUN 127 (H) 6 - 20 mg/dL   Creatinine, Ser 9.49 (H) 0.61 - 1.24 mg/dL   Calcium 8.9 8.9 - 10.3 mg/dL   GFR calc non Af Amer 5 (L) >60 mL/min   GFR calc Af Amer 6 (L) >60 mL/min   Anion gap 11 5 - 15  Procalcitonin     Status: None   Collection Time: 06/07/16  9:13 PM  Result Value Ref Range   Procalcitonin 0.33 ng/mL  Basic metabolic panel     Status: Abnormal   Collection Time: 06/08/16 12:18 AM  Result Value Ref Range   Sodium 143 135 - 145 mmol/L   Potassium 5.3 (H) 3.5 - 5.1 mmol/L   Chloride 121 (H) 101 - 111 mmol/L   CO2 9 (L) 22 - 32 mmol/L   Glucose, Bld 123 (H) 65 - 99 mg/dL   BUN 124 (H) 6 - 20 mg/dL   Creatinine, Ser 8.99 (H) 0.61 - 1.24 mg/dL   Calcium 8.7 (L) 8.9 - 10.3 mg/dL   GFR calc non Af Amer 5 (L) >60 mL/min   GFR calc Af Amer 6 (L) >60 mL/min   Anion gap 13 5 - 15  Magnesium     Status:  None   Collection Time: 06/08/16 12:18 AM  Result Value Ref Range   Magnesium 2.0 1.7 - 2.4 mg/dL  Basic metabolic panel     Status: Abnormal   Collection Time: 06/08/16  3:19 AM  Result Value Ref Range   Sodium 144 135 - 145 mmol/L   Potassium 4.8 3.5 - 5.1 mmol/L  Chloride 122 (H) 101 - 111 mmol/L   CO2 13 (L) 22 - 32 mmol/L   Glucose, Bld 116 (H) 65 - 99 mg/dL   BUN 118 (H) 6 - 20 mg/dL   Creatinine, Ser 8.51 (H) 0.61 - 1.24 mg/dL   Calcium 8.2 (L) 8.9 - 10.3 mg/dL   GFR calc non Af Amer 6 (L) >60 mL/min   GFR calc Af Amer 6 (L) >60 mL/min   Anion gap 9 5 - 15  Troponin I     Status: Abnormal   Collection Time: 06/08/16  3:19 AM  Result Value Ref Range   Troponin I 0.09 (HH) <0.03 ng/mL  Basic metabolic panel     Status: Abnormal   Collection Time: 06/08/16  7:54 AM  Result Value Ref Range   Sodium 144 135 - 145 mmol/L   Potassium 4.8 3.5 - 5.1 mmol/L   Chloride 119 (H) 101 - 111 mmol/L   CO2 14 (L) 22 - 32 mmol/L   Glucose, Bld 142 (H) 65 - 99 mg/dL   BUN 110 (H) 6 - 20 mg/dL   Creatinine, Ser 7.88 (H) 0.61 - 1.24 mg/dL   Calcium 8.2 (L) 8.9 - 10.3 mg/dL   GFR calc non Af Amer 6 (L) >60 mL/min   GFR calc Af Amer 7 (L) >60 mL/min   Anion gap 11 5 - 15  Troponin I (q 6hr x 3)     Status: Abnormal   Collection Time: 06/08/16  7:54 AM  Result Value Ref Range   Troponin I 0.11 (HH) <0.03 ng/mL  CBC     Status: Abnormal   Collection Time: 06/08/16  7:54 AM  Result Value Ref Range   WBC 8.1 4.0 - 10.5 K/uL   RBC 4.10 (L) 4.22 - 5.81 MIL/uL   Hemoglobin 9.2 (L) 13.0 - 17.0 g/dL   HCT 29.8 (L) 39.0 - 52.0 %   MCV 72.7 (L) 78.0 - 100.0 fL   MCH 22.4 (L) 26.0 - 34.0 pg   MCHC 30.9 30.0 - 36.0 g/dL   RDW 21.2 (H) 11.5 - 15.5 %   Platelets 193 150 - 400 K/uL  Glucose, capillary     Status: Abnormal   Collection Time: 06/08/16  8:12 AM  Result Value Ref Range   Glucose-Capillary 128 (H) 65 - 99 mg/dL   Comment 1 Repeat Test   Glucose, capillary     Status: Abnormal    Collection Time: 06/08/16 11:34 AM  Result Value Ref Range   Glucose-Capillary 129 (H) 65 - 99 mg/dL   Comment 1 Repeat Test   I-STAT 3, arterial blood gas (G3+)     Status: Abnormal   Collection Time: 06/08/16 12:17 PM  Result Value Ref Range   pH, Arterial 7.304 (L) 7.350 - 7.450   pCO2 arterial 25.9 (L) 32.0 - 48.0 mmHg   pO2, Arterial 201.0 (H) 83.0 - 108.0 mmHg   Bicarbonate 12.9 (L) 20.0 - 28.0 mmol/L   TCO2 14 0 - 100 mmol/L   O2 Saturation 100.0 %   Acid-base deficit 12.0 (H) 0.0 - 2.0 mmol/L   Patient temperature 98.6 F    Collection site RADIAL, ALLEN'S TEST ACCEPTABLE    Drawn by RT    Sample type ARTERIAL   Troponin I (q 6hr x 3)     Status: Abnormal   Collection Time: 06/08/16  2:53 PM  Result Value Ref Range   Troponin I 0.12 (HH) <0.03 ng/mL  Basic metabolic panel  Status: Abnormal   Collection Time: 06/08/16  2:53 PM  Result Value Ref Range   Sodium 142 135 - 145 mmol/L   Potassium 4.5 3.5 - 5.1 mmol/L   Chloride 115 (H) 101 - 111 mmol/L   CO2 17 (L) 22 - 32 mmol/L   Glucose, Bld 167 (H) 65 - 99 mg/dL   BUN 102 (H) 6 - 20 mg/dL   Creatinine, Ser 7.18 (H) 0.61 - 1.24 mg/dL   Calcium 8.2 (L) 8.9 - 10.3 mg/dL   GFR calc non Af Amer 7 (L) >60 mL/min   GFR calc Af Amer 8 (L) >60 mL/min   Anion gap 10 5 - 15   Recent Results (from the past 240 hour(s))  Blood culture (routine x 2)     Status: None (Preliminary result)   Collection Time: 06/07/16  1:20 PM  Result Value Ref Range Status   Specimen Description BLOOD RIGHT ANTECUBITAL  Final   Special Requests BOTTLES DRAWN AEROBIC AND ANAEROBIC  5CC  Final   Culture NO GROWTH < 24 HOURS  Final   Report Status PENDING  Incomplete  Blood culture (routine x 2)     Status: None (Preliminary result)   Collection Time: 06/07/16  3:24 PM  Result Value Ref Range Status   Specimen Description BLOOD RIGHT HAND  Final   Special Requests IN PEDIATRIC BOTTLE 2CC  Final   Culture NO GROWTH < 24 HOURS  Final   Report  Status PENDING  Incomplete  MRSA PCR Screening     Status: None   Collection Time: 06/07/16  8:33 PM  Result Value Ref Range Status   MRSA by PCR NEGATIVE NEGATIVE Final    Comment:        The GeneXpert MRSA Assay (FDA approved for NASAL specimens only), is one component of a comprehensive MRSA colonization surveillance program. It is not intended to diagnose MRSA infection nor to guide or monitor treatment for MRSA infections.     Renal Function:  Recent Labs  06/07/16 1423 06/07/16 1708 06/07/16 2113 06/08/16 0018 06/08/16 0319 06/08/16 0754 06/08/16 1453  CREATININE 11.31* 10.96* 9.49* 8.99* 8.51* 7.88* 7.18*   Estimated Creatinine Clearance: 9.4 mL/min (by C-G formula based on SCr of 7.18 mg/dL (H)).  Radiologic Imaging: Ct Abdomen Pelvis Wo Contrast  Result Date: 06/07/2016 CLINICAL DATA:  Abdominal pain. Scrotal swelling. Evaluate for scrotal mass. EXAM: CT ABDOMEN AND PELVIS WITHOUT CONTRAST TECHNIQUE: Multidetector CT imaging of the abdomen and pelvis was performed following the standard protocol without IV contrast. COMPARISON:  02/11/2008 FINDINGS: Lower chest: Lung bases are clear. No effusions. Heart is normal size. - Hepatobiliary: No focal hepatic abnormality. Gallbladder unremarkable. Pancreas: No focal abnormality or ductal dilatation. Spleen: No focal abnormality.  Normal size. Adrenals/Urinary Tract: Multiple hyperdense and hypodense lesions within the kidneys bilaterally, likely a combination of simple and complex/ hemorrhagic cysts. The largest is in the right no pole measuring 5.6 cm. This previously measured 3.3 cm. Cannot completely exclude that some of these could represent solid renal lesions. No hydronephrosis. 2.1 cm left adrenal nodule compared to 1.8 cm previously. Right adrenal gland and urinary bladder grossly unremarkable. Stomach/Bowel: Postoperative changes noted within the colon. Right lower quadrant ostomy present. There is Collie Siad is an layering  fluid collection noted in the pelvis with surrounding suture lines. This presumably represents a dilated denervation segment of distal bowel, but is difficult to exclude separate pelvic abscess. This measures up to 7.6 cm. Small bowel and stomach decompressed. Vascular/Lymphatic:  Diffuse iliac and aortic calcifications. No aneurysm. No adenopathy. Reproductive: Very large bilateral hydroceles, left larger than right. Other: No free fluid or free air. Musculoskeletal: No acute bony abnormality or focal bone lesion. IMPRESSION: Changes of prior bowel surgery with right lower quadrant ostomy. There is a gas and fluid collection in the pelvis measuring 7.6 cm with surrounding suture. This is intimately associated with the sigmoid colon remnant and may represent a segment of these are patent dilated bowel, but I cannot completely exclude that this is an abscess adjacent to the Hartmann's pouch. Mixture of low-density and high-density lesions within the kidneys bilaterally, enlarging since prior study. The largest cyst in the midpole measuring 5.6 cm compared to 3.3 cm previously. While these may reflect a combination of simple and hemorrhagic/complex cysts, I cannot exclude solid renal lesions/ neoplasms. Recommend further evaluation with contrast-enhanced CT or MRI. Left adrenal nodule, slightly larger than prior study, nonspecific. Aortoiliac atherosclerosis. Large bilateral hydroceles, left greater than right. Electronically Signed   By: Rolm Baptise M.D.   On: 06/07/2016 16:31   Dg Chest Port 1 View  Result Date: 06/08/2016 CLINICAL DATA:  Central line placement EXAM: PORTABLE CHEST 1 VIEW COMPARISON:  06/08/2016 FINDINGS: Stable endotracheal and NG tube position. Again noted left IJ central line going cephalad in right brachiocephalic vein. No pneumothorax. IMPRESSION: Again noted left IJ central line with tip going cephalad in right brachiocephalic vein. No pneumothorax. These results were called by telephone  at the time of interpretation on 06/08/2016 at 3:01 pm to patient's ICU nurse Kieth Brightly, who verbally acknowledged these results. Electronically Signed   By: Lahoma Crocker M.D.   On: 06/08/2016 15:02   Dg Chest Port 1 View  Result Date: 06/08/2016 CLINICAL DATA:  Endotracheal tube placement. EXAM: PORTABLE CHEST 1 VIEW COMPARISON:  06/07/2016 FINDINGS: New endotracheal tube tip projects 2.9 cm above the Carina. Nasal/orogastric tube passes to the level of the medial left hemidiaphragm, below the included field of view, likely in the stomach. Left internal jugular central venous line has its tip extending towards the right lung apex, consistent with it extending upward into the right brachiocephalic vein. No pneumothorax.  Lungs are clear. IMPRESSION: 1. Left internal jugular central venous line catheter is malpositioned positioned, with its tip projecting within the right brachiocephalic vein are central right internal jugular vein. 2. Well-positioned endotracheal tube. 3. Nasal/orogastric tube passes below the included field of view likely within the stomach. 4. Lungs remain clear. 5. No pneumothorax. Electronically Signed   By: Lajean Manes M.D.   On: 06/08/2016 12:04   Dg Chest Port 1 View  Result Date: 06/07/2016 CLINICAL DATA:  70 year old male with shortness breath and weakness for 2 days. Initial encounter. EXAM: PORTABLE CHEST 1 VIEW COMPARISON:  03/21/2005. FINDINGS: Portable exam with multiple overlying leads. No infiltrate, congestive heart failure or pneumothorax. No plain film evidence of pulmonary malignancy. Heart size within normal limits. Calcified slightly tortuous aorta. Bilateral shoulder joint degenerative changes. IMPRESSION: No acute abnormality. Aortic atherosclerosis. Electronically Signed   By: Genia Del M.D.   On: 06/07/2016 13:53    I independently reviewed the above imaging studies.  Impression/Recommendation  1.  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  These are likely to have been present for an extended period of time.  These would not likely impact his current acute illness nor be a cause for his acute illness.  Treatment would  require surgical excision.  He is recommended to follow up as an outpatient following his acute illness for further evaluation and to subsequently plan for surgical treatment assuming he recovers appropriately from his current hospitalization.  No specific wound care is recommended or required.  2.  Bilateral renal masses: His masses likely represent a combination of simple and complex renal cyst.  However, definitive enhancement is not able to be determined in the absence of IV contrast.  He cannot receive contrast currently considering his acute renal failure.  I'm not sure what his baseline renal function is although this can be reevaluated following his acute illness and further evaluation with a renal ultrasound or noncontrast MRI may be helpful if he cannot receive intravenous contrast due to renal function.  3.  Bilateral hydroceles: He would be appropriate to proceed with an elective scrotal ultrasound during his hospitalization to rule out any underlying testicular pathology although the findings on the CT scan suggests simple bilateral hydroceles.  Lamiyah Schlotter,LES 06/08/2016, 6:17 PM    Pryor Curia MD   CC: Dr. Nelda Marseille

## 2016-06-08 NOTE — Procedures (Signed)
Intubation Procedure Note [name] [id number] 1946/02/04  Procedure: Intubation Indications: Airway protection and maintenance  Procedure Details Consent: Unable to obtain consent because of emergent medical necessity. Time Out: Verified patient identification, verified procedure, site/side was marked, verified correct patient position, special equipment/implants available, medications/allergies/relevent history reviewed, required imaging and test results available.  Performed  Maximum sterile technique was used including cap, gloves, gown, hand hygiene and mask.  MAC and 4    Evaluation Hemodynamic Status: BP stable throughout; O2 sats: stable throughout Patient's Current Condition: stable Complications: No apparent complications Patient did tolerate procedure well. Chest X-ray ordered to verify placement.  CXR: pending.  Pt intubated using glidescope #4 blade with 7.5 ett secured at 24 at the lip. Pt with bilateral BS (rhonchi/diminished) throughout, direct visualization, positive color change noted on etco2, CXR pending.  Pt stable throughout with no complications. RT will continue to monitor.    Jesse Sans 06/08/2016

## 2016-06-08 NOTE — Progress Notes (Signed)
PULMONARY / CRITICAL CARE MEDICINE   Name: [name] MRN: [id number] DOB: 11/12/1945    ADMISSION DATE:  06/07/2016 CONSULTATION DATE: 06/07/16  REFERRING MD:  Dr. Winfred Leeds EDP  CHIEF COMPLAINT:  Hyperkalemia  HISTORY OF PRESENT ILLNESS:   70 year old male with PMH as below which is significant for CHF, CKD III, COPD, diverticulitis with perforation and now ostomy since 2009, and arthritis. Per family he has been feeling unwell for probably about a week, but notably on 10/22 he started getting more lethargic. No specific complaints, just "didn't feel well". 10/24 when daughter got to his house he was fairly lethargic so she called EMS and he was brought to the ED where laboratory evaluation significant for K >7.5, CO2 8, Creatinine 11.31. He was temporized and K fell to 6.9. He was seen by nephrology who recommended bicarb and kayexalate and potentially HD if not improving. New fungating scrotal/testicular mass identified in ED. CT abdomen and pelvis demonstrated possible intraabdominal abscess. PCCM asked to admit.   PAST MEDICAL HISTORY :  He  has a past medical history of Arthritis; CHF (congestive heart failure) ([place]); Chronic kidney disease, stage 3; Colon obstruction; COPD (chronic obstructive pulmonary disease) ([place]); Diverticulitis; Erectile dysfunction; Fistula; Gout; Hypertension; Inguinal hernia; Osteoarthritis; Perforated bowel ([place]); and Peritonitis ([place]).  PAST SURGICAL HISTORY: He  has a past surgical history that includes Colon surgery.  No Known Allergies  No current facility-administered medications on file prior to encounter.    Current Outpatient Prescriptions on File Prior to Encounter  Medication Sig  . albuterol (PROVENTIL) (2.5 MG/3ML) 0.083% nebulizer solution Take 2.5 mg by nebulization every 6 (six) hours as needed for wheezing or shortness of breath.  . AMLODIPINE BESYLATE PO Take by mouth.  . carvedilol (COREG) 25 MG tablet Take 25 mg by mouth 2  (two) times daily with a meal.  . furosemide (LASIX) 40 MG tablet Take 40 mg by mouth.  Marland Kitchen HYDROcodone-acetaminophen (NORCO/VICODIN) 5-325 MG per tablet Take 1 tablet by mouth every 6 (six) hours as needed.  Marland Kitchen lisinopril (PRINIVIL,ZESTRIL) 20 MG tablet Take 20 mg by mouth daily.  . potassium chloride (KLOR-CON) 20 MEQ packet Take 20 mEq by mouth daily.  . sildenafil (VIAGRA) 50 MG tablet Take 50 mg by mouth daily as needed for erectile dysfunction.  . traMADol (ULTRAM) 50 MG tablet Take by mouth every 6 (six) hours as needed.    FAMILY HISTORY:  His has no family status information on file.    SOCIAL HISTORY: He  reports that he has been smoking.  He does not have any smokeless tobacco history on file. He reports that he does not drink alcohol.  REVIEW OF SYSTEMS:   Denies any chest pain or shortness of breath. Reports he is doing fine.   SUBJECTIVE:  Overnight noted to have hypotension to 70/54 with HR 105. He received 1L bolus overnight (4L total since admission). MAPs improved until patient noted to have increased ectopy; then he was started on Neo-synephrine. Patient was noted to have multiple PVCs and nonsustained Vtach. Due to continued ectopy, he was started on amiodarone bolus plus drip. Despite this, he had 38 run of vtach, and therefore was started on Lidocaine. He continued to have multiple PVCs and nonsustained vtach. Therefore cardiology was called urgently for evaluation. Per cards recommendations, he received second amiodarone bolus (150mg ). Then he became bradycardic with ranges to 55 to 60s. His HR has improved slightly and staying in 60s now up to 70s.  VITAL SIGNS: BP (!) 86/56   Pulse 88   Temp 98.4 F (36.9 C) (Oral)   Resp 19   Ht 5\' 9"  (1.753 m)   Wt 73.5 kg (162 lb 0.6 oz)   SpO2 98%   BMI 23.93 kg/m   HEMODYNAMICS:    VENTILATOR SETTINGS:    INTAKE / OUTPUT: No intake/output data recorded.  PHYSICAL EXAMINATION: GEN: NAD, awake and alert   HEENT:EOMI, sclera clear  CV: RRR, no murmurs, rubs, or gallops PULM: CTAB, normal effort ABD: Soft, tender to palpation diffusely with some guarding but no rebound. Ostomy site pink and with dark green output, nondistended, NABS SKIN: No rash or cyanosis; warm and well-perfused EXTR: No lower extremity edema or calf tenderness NEURO: Awake, alert   LABS:  BMET  Recent Labs Lab 06/07/16 2113 06/08/16 0018 06/08/16 0319  NA 141 143 144  K 5.6* 5.3* 4.8  CL 121* 121* 122*  CO2 9* 9* 13*  BUN 127* 124* 118*  CREATININE 9.49* 8.99* 8.51*  GLUCOSE 127* 123* 116*    Electrolytes  Recent Labs Lab 06/07/16 2113 06/08/16 0018 06/08/16 0319  CALCIUM 8.9 8.7* 8.2*  MG  --  2.0  --     CBC  Recent Labs Lab 06/07/16 1335  WBC 12.6*  HGB 12.5*  HCT 40.7  PLT 286    Coag's No results for input(s): APTT, INR in the last 168 hours.  Sepsis Markers  Recent Labs Lab 06/07/16 1347 06/07/16 1717 06/07/16 2113  LATICACIDVEN 1.92* 1.77  --   PROCALCITON  --   --  0.33    ABG No results for input(s): PHART, PCO2ART, PO2ART in the last 168 hours.  Liver Enzymes  Recent Labs Lab 06/07/16 1423  AST 17  ALT 31  ALKPHOS 103  BILITOT 0.7  ALBUMIN 3.5    Cardiac Enzymes  Recent Labs Lab 06/08/16 0319  TROPONINI 0.09*    Glucose  Recent Labs Lab 06/07/16 2107  GLUCAP 111*    STUDIES:  CT Abd/Pelvis 10/24 > Changes of prior bowel surgery with right lower quadrant ostomy. There is a gas and fluid collection in the pelvis measuring 7.6 cm with surrounding suture. This is intimately associated with the sigmoid colon remnant and may represent a segment of these are patent dilated bowel, but I cannot completely exclude that this is an abscess adjacent to the Hartmann's pouch. Mixture of low-density and high-density lesions within the kidneys bilaterally, enlarging since prior study. The largest cyst in the midpole measuring 5.6 cm compared to 3.3 cm  previously. While these may reflect a combination of simple and hemorrhagic/complex cysts, I cannot exclude solid renal lesions/ neoplasms. Recommend further evaluation with contrast-enhanced CT or MRI. Left adrenal nodule, slightly larger than prior study, nonspecific. Aortoiliac atherosclerosis. Large bilateral hydroceles, left greater than right. CXR 10/24: no acute abnormality  CULTURES: Blood Cx 10/24 > Urine 10/24 >  ANTIBIOTICS: Zosyn 10/24 >  SIGNIFICANT EVENTS: 10/24: admit for hyperkalemia, lethargy 10/25: ectopy started on amiodarone bolus and drip. Started on lidocaine bolus and drip. Seen by cardiology  LINES/TUBES:   DISCUSSION: 70 year old male presenting with 4 days lethargy thought to be due to uremic encephalopathy which is now resolved. Found to be in acute on chronic renal failure and profoundly hyperkalemia which has now resolved with Kayexelate and bicarb drip. Also found to have a possible intra-abdominal abscess and was started on Zosyn on admit. Overnight he was noted to have PVC and non-sustained Vtach not  controlled with IV amiodarone (bolus and drip) and Lidocaine drip. Also developed hypotension/shock requiring Neo-synephrine likely septic shock. Ectopy with nonsustained Vtach likely due to sepsis.    ASSESSMENT / PLAN:  CARDIOVASCULAR A:  Hypotension Shock- requiring Neo. Differentials include septic vs cardiogenic (possibly caused by ectopy) Ectopy with nonsustained VT: multiple PVCs and nonsustained vtach which started after electrolytes normalized. Troponin 0.09 H/o CHF H/o HTN P:  Telemetry monitoring Neo MAP > 65 Amiodarone drip (s/p bolus x 2) Lidocaine drip (s/p bolus) > consider discontinuing  Cardiology Consulted, appreciate reccs: continue amiodarone, would discontinue Lidocaine. Likely would improve as acute illness resolves.  Trending troponins  RENAL A:   Acute kidney injury on CKD III >acute episode likely due to ACE-I,  dehydration, lasix: creatinine improving. (unknown baseline)  Hyperkalemia, severe: resolved  NAG acidosis: improving  Lactic Acidosis: resolved  P:   Nephrology following: continue bicarb drip, BMP BID Bicarb infusion Foley for urine output monitoring   GASTROINTESTINAL/GENITOURINARY A:   H/o  Bowel perf now with ostomy Testicular/scrotal mass Bilateral hydrocele  P:   Keep NPO for now Pepcid for SUP Will likely need urology consult after patient is stable  PULMONARY A: At risk for poor airway protection due to AMS: Ams has now resolved  COPD without acute exacerbation  P:   Intubation risk, monitor PRN albuterol  HEMATOLOGIC A:   Leukocytosis -  Possible intra-abdominal abscess?  P:  CBC pending Heparin subcutaneous for VTE ppx Will need surgery consult after he is stable  INFECTIOUS A:   Possible intraabdominal abscess: pro-calcitonin 0.33 P:   Zosyn Surgical consult once stabilized Follow blood cx and urine cx.  ENDOCRINE A:   No acute issues P:   Monitor with BMP  NEUROLOGIC A:   Acute metabolic encephalopathy in setting of uremia: resolved  P:   RASS goal: 0 to -1 Monitor   FAMILY  - Updates: daughters updated by [facility] in ED  - Inter-disciplinary family meet or Palliative Care meeting due by:  10/31   Smiley Houseman, MD PGY 2 Family Medicine  06/08/2016, 6:20 AM  Rush Farmer, M.D. [facility] Pulmonary/Critical Care Medicine. Pager: [phone]. After hours pager: [phone].

## 2016-06-08 NOTE — Progress Notes (Addendum)
Interim Progress Note  Nursing noted of hypotension to 70/54 with HR 105. Patient sleeping in the room but does wake up. Denies SOB or chest pain.   Lungs are overall clear to auscultation, no LE swelling. Received total 3L during this admission with last bolus at 1952. Will give 1L bolus now and then re-evaluate (currently infusing).   Does meet 4/4 SIRS criteria. Is currently on Zosyn for questionable abdominal abscess.  Will continue to monitor.   Also noted to have PVCs and nonsustained Vtach. BMP checked with improving hyperkalemia. Mag is normal. Will touch base with Vincent Peyer, MD PGY 2 Family Medicine   UPDATE:   Spoke with Dr. Oletta Darter from [facility].  Recommends starting Neo-synephrine with goal MAP >65 for blood pressure support as well as Amiodarone for Ectopy. Will also trend troponin, ECHO in AM, and will need to consult cardiology in the AM.   If does not improve, would consider broadening antibiotics.   Smiley Houseman, MD PGY 2 Family Medicine [phone] 06/08/16

## 2016-06-08 NOTE — Progress Notes (Signed)
CRITICAL VALUE ALERT  Critical value received:  Troponin 0.09  Date of notification:  06/08/16  Time of notification:  0330  Critical value read back:Yes.    Nurse who received alert:  Damita Dunnings, RN  MD notified (1st page):  Dallas Schimke  Time of first page:  0330  MD notified (2nd page):  Time of second page:  Responding MD:  Dallas Schimke  Time MD responded:  [id number]

## 2016-06-08 NOTE — Progress Notes (Signed)
*  PRELIMINARY RESULTS* Echocardiogram 2D Echocardiogram has been performed.  [name] 06/08/2016, 2:40 PM

## 2016-06-08 NOTE — Progress Notes (Signed)
Patient becoming more lethargic Dr Nelda Marseille spoke with daughter agreed that intubation was best to protect patient airway

## 2016-06-08 NOTE — Progress Notes (Signed)
PULMONARY / CRITICAL CARE MEDICINE   Name: [name] MRN: [id number] DOB: [birth year]    ADMISSION DATE:  06/07/2016 CONSULTATION DATE: 06/07/16  REFERRING MD:  Dr. Winfred Leeds EDP  CHIEF COMPLAINT:  Hyperkalemia  HISTORY OF PRESENT ILLNESS:   70 year old male with PMH as below which is significant for CHF, CKD III, COPD, diverticulitis with perforation and now ostomy since 2009, and arthritis. Per family he has been feeling unwell for probably about a week, but notably on 10/22 he started getting more lethargic. No specific complaints, just "didn't feel well". 10/24 when daughter got to his house he was fairly lethargic so she called EMS and he was brought to the ED where laboratory evaluation significant for K >7.5, CO2 8, Creatinine 11.31. He was temporized and K fell to 6.9. He was seen by nephrology who recommended bicarb and kayexalate and potentially HD if not improving. New fungating scrotal/testicular mass identified in ED. CT abdomen and pelvis demonstrated possible intraabdominal abscess. PCCM asked to admit.   PAST MEDICAL HISTORY :  He  has a past medical history of Arthritis; CHF (congestive heart failure) ([place]); Chronic kidney disease, stage 3; Colon obstruction; COPD (chronic obstructive pulmonary disease) ([place]); Diverticulitis; Erectile dysfunction; Fistula; Gout; Hypertension; Inguinal hernia; Osteoarthritis; Perforated bowel ([place]); and Peritonitis ([place]).  PAST SURGICAL HISTORY: He  has a past surgical history that includes Colon surgery.  No Known Allergies  No current facility-administered medications on file prior to encounter.    Current Outpatient Prescriptions on File Prior to Encounter  Medication Sig  . lisinopril (PRINIVIL,ZESTRIL) 20 MG tablet Take 20 mg by mouth daily.  . traMADol (ULTRAM) 50 MG tablet Take 50 mg by mouth 2 (two) times daily as needed for moderate pain or severe pain.   Marland Kitchen albuterol (PROVENTIL) (2.5 MG/3ML) 0.083% nebulizer solution  Take 2.5 mg by nebulization every 6 (six) hours as needed for wheezing or shortness of breath.  Marland Kitchen amLODipine (NORVASC) 10 MG tablet Take 10 mg by mouth daily.   . carvedilol (COREG) 25 MG tablet Take 25 mg by mouth 2 (two) times daily with a meal.  . furosemide (LASIX) 40 MG tablet Take 40 mg by mouth.  Marland Kitchen HYDROcodone-acetaminophen (NORCO/VICODIN) 5-325 MG per tablet Take 1 tablet by mouth every 6 (six) hours as needed.  . potassium chloride (KLOR-CON) 20 MEQ packet Take 20 mEq by mouth daily.  . sildenafil (VIAGRA) 50 MG tablet Take 50 mg by mouth daily as needed for erectile dysfunction.    FAMILY HISTORY:  His has no family status information on file.    SOCIAL HISTORY: He  reports that he has been smoking.  He does not have any smokeless tobacco history on file. He reports that he does not drink alcohol.  REVIEW OF SYSTEMS:   Denies any chest pain or shortness of breath. Reports he is doing fine.   SUBJECTIVE:  Overnight noted to have hypotension to 70/54 with HR 105. He received 1L bolus overnight (4L total since admission). MAPs improved until patient noted to have increased ectopy; then he was started on Neo-synephrine. Patient was noted to have multiple PVCs and nonsustained Vtach. Due to continued ectopy, he was started on amiodarone bolus plus drip. Despite this, he had 38 run of vtach, and therefore was started on Lidocaine. He continued to have multiple PVCs and nonsustained vtach. Therefore cardiology was called urgently for evaluation. Per cards recommendations, he received second amiodarone bolus (150mg ). Then he became bradycardic with ranges to 55 to  60s. His HR has improved slightly and staying in 60s now up to 70s.   VITAL SIGNS: BP 116/61   Pulse 72   Temp 99.3 F (37.4 C) (Oral)   Resp 18   Ht 5\' 9"  (1.753 m)   Wt 73.5 kg (162 lb 0.6 oz)   SpO2 100%   BMI 23.93 kg/m   HEMODYNAMICS:    VENTILATOR SETTINGS:    INTAKE / OUTPUT: I/O last 3 completed  shifts: In: 3100.4 [I.V.:3050.4; IV Piggyback:50] Out: [id number] [Urine:560; Stool:1250]  PHYSICAL EXAMINATION: GEN: NAD, awake and alert  HEENT:EOMI, sclera clear  CV: RRR, no murmurs, rubs, or gallops PULM: CTAB, normal effort ABD: Soft, tender to palpation diffusely with some guarding but no rebound. Ostomy site pink and with dark green output, nondistended, NABS SKIN: No rash or cyanosis; warm and well-perfused EXTR: No lower extremity edema or calf tenderness NEURO: Awake, alert   LABS:  BMET  Recent Labs Lab 06/08/16 0018 06/08/16 0319 06/08/16 0754  NA 143 144 144  K 5.3* 4.8 4.8  CL 121* 122* 119*  CO2 9* 13* 14*  BUN 124* 118* 110*  CREATININE 8.99* 8.51* 7.88*  GLUCOSE 123* 116* 142*    Electrolytes  Recent Labs Lab 06/08/16 0018 06/08/16 0319 06/08/16 0754  CALCIUM 8.7* 8.2* 8.2*  MG 2.0  --   --    CBC  Recent Labs Lab 06/07/16 1335 06/08/16 0754  WBC 12.6* 8.1  HGB 12.5* 9.2*  HCT 40.7 29.8*  PLT 286 193   Coag's No results for input(s): APTT, INR in the last 168 hours.  Sepsis Markers  Recent Labs Lab 06/07/16 1347 06/07/16 1717 06/07/16 2113  LATICACIDVEN 1.92* 1.77  --   PROCALCITON  --   --  0.33   ABG No results for input(s): PHART, PCO2ART, PO2ART in the last 168 hours.  Liver Enzymes  Recent Labs Lab 06/07/16 1423  AST 17  ALT 31  ALKPHOS 103  BILITOT 0.7  ALBUMIN 3.5   Cardiac Enzymes  Recent Labs Lab 06/08/16 0319 06/08/16 0754  TROPONINI 0.09* 0.11*   Glucose  Recent Labs Lab 06/07/16 2107  GLUCAP 111*    STUDIES:  CT Abd/Pelvis 10/24 > Changes of prior bowel surgery with right lower quadrant ostomy. There is a gas and fluid collection in the pelvis measuring 7.6 cm with surrounding suture. This is intimately associated with the sigmoid colon remnant and may represent a segment of these are patent dilated bowel, but I cannot completely exclude that this is an abscess adjacent to the Hartmann's pouch.  Mixture of low-density and high-density lesions within the kidneys bilaterally, enlarging since prior study. The largest cyst in the midpole measuring 5.6 cm compared to 3.3 cm previously. While these may reflect a combination of simple and hemorrhagic/complex cysts, I cannot exclude solid renal lesions/ neoplasms. Recommend further evaluation with contrast-enhanced CT or MRI. Left adrenal nodule, slightly larger than prior study, nonspecific. Aortoiliac atherosclerosis. Large bilateral hydroceles, left greater than right. CXR 10/24: no acute abnormality  CULTURES: Blood Cx 10/24 > Urine 10/24 >  ANTIBIOTICS: Zosyn 10/24 >  SIGNIFICANT EVENTS: 10/24: admit for hyperkalemia, lethargy 10/25: ectopy started on amiodarone bolus and drip. Started on lidocaine bolus and drip. Seen by cardiology  LINES/TUBES:   DISCUSSION: 70 year old male presenting with 4 days lethargy thought to be due to uremic encephalopathy which is now resolved. Found to be in acute on chronic renal failure and profoundly hyperkalemia which has now resolved with [facility]  and bicarb drip. Also found to have a possible intra-abdominal abscess and was started on Zosyn on admit. Overnight he was noted to have PVC and non-sustained Vtach not controlled with IV amiodarone (bolus and drip) and Lidocaine drip. Also developed hypotension/shock requiring Neo-synephrine likely septic shock. Ectopy with nonsustained Vtach likely due to sepsis.    ASSESSMENT / PLAN:  CARDIOVASCULAR A:  Hypotension Shock- requiring Neo. Differentials include septic vs cardiogenic (possibly caused by ectopy) Ectopy with nonsustained VT: multiple PVCs and nonsustained vtach which started after electrolytes normalized. Troponin 0.09 H/o CHF H/o HTN P:  Telemetry monitoring Neo MAP > 65 Amiodarone drip (s/p bolus x 2) D/C lidocaine drip Continue amiodarone Cardiology Consulted Trending troponins 0.11  RENAL A:   Acute kidney injury  on CKD III >acute episode likely due to ACE-I, dehydration, lasix: creatinine improving. (unknown baseline)  Hyperkalemia, severe: resolved  NAG acidosis: improving  Lactic Acidosis: resolved  P:   Nephrology following: continue bicarb drip, BMP BID Bicarb infusion Foley for urine output monitoring   GASTROINTESTINAL/GENITOURINARY A:   H/o  Bowel perf now with ostomy Testicular/scrotal mass Bilateral hydrocele  P:   Keep NPO for now Pepcid for SUP Consult urology and CCS  PULMONARY A: At risk for poor airway protection due to AMS: Ams has now resolved  COPD without acute exacerbation  P:   F/U ABG. PRN albuterol. Intubate and full vent support.  HEMATOLOGIC A:   Leukocytosis -  Possible intra-abdominal abscess?  P:  CBC pending Heparin subcutaneous for VTE ppx Will need surgery consult after he is stable  INFECTIOUS A:   Possible intraabdominal abscess: pro-calcitonin 0.33 P:   Zosyn Surgery and urology consult called Follow blood cx and urine cx.  ENDOCRINE A:   No acute issues P:   Monitor with BMP  NEUROLOGIC A:   Acute metabolic encephalopathy in setting of uremia: resolved  P:   RASS goal: 0 to -1 Monitor  FAMILY  - Updates: Patient updated bedside and daughter over the phone.  Will intubate and stabilize then await surgical input.  - Inter-disciplinary family meet or Palliative Care meeting due by:  10/31  The patient is critically ill with multiple organ systems failure and requires high complexity decision making for assessment and support, frequent evaluation and titration of therapies, application of advanced monitoring technologies and extensive interpretation of multiple databases.   Critical Care Time devoted to patient care services described in this note is  35  Minutes. This time reflects time of care of this signee Dr Jennet Maduro. This critical care time does not reflect procedure time, or teaching time or supervisory  time of PA/NP/Med student/Med Resident etc but could involve care discussion time.  Rush Farmer, M.D. [facility] Pulmonary/Critical Care Medicine. Pager: [phone]. After hours pager: [phone].

## 2016-06-08 NOTE — Procedures (Signed)
Intubation Procedure Note [name] [id number] 10/13/1945  Procedure: Intubation Indications: Airway protection and maintenance  Procedure Details Consent: Risks of procedure as well as the alternatives and risks of each were explained to the (patient/caregiver).  Consent for procedure obtained. Time Out: Verified patient identification, verified procedure, site/side was marked, verified correct patient position, special equipment/implants available, medications/allergies/relevent history reviewed, required imaging and test results available.  Performed  Maximum sterile technique was used including antiseptics, hand hygiene and mask.  MAC    Evaluation Hemodynamic Status: BP stable throughout; O2 sats: stable throughout Patient's Current Condition: stable Complications: No apparent complications Patient did tolerate procedure well. Chest X-ray ordered to verify placement.  CXR: pending   Donata Reddick 06/08/2016

## 2016-06-08 NOTE — Consult Note (Signed)
Cardiology Consult    Patient ID: DRAYSEN LOWERY MRN: [id number], DOB/AGE: 11/13/1945   Admit date: 06/07/2016 Date of Consult: 06/08/2016  Primary Physician: No primary care provider on file. Reason for Consult: VT Primary Cardiologist: New, Dr. [place] Requesting Provider: Dr. Dallas Schimke  Patient Profile    Mr. Piech is a 70 year old male with PMH which is significant for CHF (although unclear what last EF is), CKD III, COPD, diverticulitis with perforation and now ostomy since 2009, and arthritis. He presented with lethargy and malaise, found to have K > 7.5, Creatinine was 11.3, had new fungating scrotal/testicular mass and possible intraabdominal mass, developed some non sustained VT and Cardiology was consulted.   History of Present Illness    Mr. Belgarde was admitted on 06/07/16 with sepsis and acute renal failure. He developed frequent ectopy and was started on Amiodarone by the primary team. He was also subsequently started on lidocaine early in the am of 06/08/16. He had a 38 beat run of VT and Cardiology was consulted. He did not receive defibrillation or CPR.   Mr. Gaede's last Echo was in 2006 in the setting of severe sepsis as he had a perforation of his right colon, and an abscess in the sigmoid colon. Also had HCAP with resistant pseudomonas and eventually had a trachostomy placed. His EF was not commented on at that time, also had heart cath in July 2006 that showed normal cors.   He has had no Cardiology follow up since 2006 and says he does not see a Cardiologist. He denies chest pain and SOB.   Past Medical History   Past Medical History:  Diagnosis Date  . Arthritis   . CHF (congestive heart failure) ([place])   . Chronic kidney disease, stage 3   . Colon obstruction    Sigmoid  . COPD (chronic obstructive pulmonary disease) ([place])   . Diverticulitis   . Erectile dysfunction   . Fistula    chronic enterocutaneous  . Gout   . Hypertension   .  Inguinal hernia   . Osteoarthritis   . Perforated bowel ([place])    perforation of the cecum  . Peritonitis [facility])     Past Surgical History:  Procedure Laterality Date  . COLON SURGERY       Allergies  No Known Allergies  Inpatient Medications    . famotidine (PEPCID) IV  20 mg Intravenous Q24H  . heparin  5,000 Units Subcutaneous Q8H  . mouth rinse  15 mL Mouth Rinse BID  . piperacillin-tazobactam (ZOSYN)  IV  2.25 g Intravenous Q8H    Family History    No family history on file.  Social History    Social History   Social History  . Marital status: Divorced    Spouse name: N/A  . Number of children: N/A  . Years of education: N/A   Occupational History  . Not on file.   Social History Main Topics  . Smoking status: Current Every Day Smoker  . Smokeless tobacco: Not on file  . Alcohol use No  . Drug use: Unknown  . Sexual activity: Not on file   Other Topics Concern  . Not on file   Social History Narrative  . No narrative on file     Review of Systems    General:  No chills, fever, night sweats or weight changes.  Cardiovascular:  No chest pain, dyspnea on exertion, edema, orthopnea, palpitations, paroxysmal nocturnal dyspnea. Dermatological: No rash, lesions/masses  Respiratory: No cough, dyspnea Urologic: No hematuria, dysuria Abdominal:   No nausea, vomiting, diarrhea, bright red blood per rectum, melena, or hematemesis Neurologic:  No visual changes, wkns, changes in mental status. All other systems reviewed and are otherwise negative except as noted above.  Physical Exam    Blood pressure 120/60, pulse 71, temperature 98.4 F (36.9 C), temperature source Oral, resp. rate 19, height 5\' 9"  (1.753 m), weight 162 lb 0.6 oz (73.5 kg), SpO2 100 %.  General: Pleasant, NAD Psych: Normal affect. Neuro: Alert and oriented X 3. Moves all extremities spontaneously. HEENT: Poor dentition  Neck: Supple without bruits or JVD. Lungs:  Resp regular and  unlabored, diminished in bases.  Heart: RRR no s3, s4, or murmurs. Abdomen: Soft, non-tender, non-distended, BS + x 4.  Extremities: No clubbing, cyanosis or edema. DP/PT/Radials 2+ and equal bilaterally.  Labs    Troponin [facility] Test)  Recent Labs  06/07/16 1345  TROPIPOC 0.02    Recent Labs  06/08/16 0319  TROPONINI 0.09*   Lab Results  Component Value Date   WBC 12.6 (H) 06/07/2016   HGB 12.5 (L) 06/07/2016   HCT 40.7 06/07/2016   MCV 75.2 (L) 06/07/2016   PLT 286 06/07/2016    Recent Labs Lab 06/07/16 1423  06/08/16 0319  NA 134*  < > 144  K >7.5*  < > 4.8  CL 115*  < > 122*  CO2 8*  < > 13*  BUN 139*  < > 118*  CREATININE 11.31*  < > 8.51*  CALCIUM 9.9  < > 8.2*  PROT 8.9*  --   --   BILITOT 0.7  --   --   ALKPHOS 103  --   --   ALT 31  --   --   AST 17  --   --   GLUCOSE 133*  < > 116*  < > = values in this interval not displayed. No results found for: CHOL, HDL, LDLCALC, TRIG No results found for: [facility]   Radiology Studies    Ct Abdomen Pelvis Wo Contrast  Result Date: 06/07/2016 CLINICAL DATA:  Abdominal pain. Scrotal swelling. Evaluate for scrotal mass. EXAM: CT ABDOMEN AND PELVIS WITHOUT CONTRAST TECHNIQUE: Multidetector CT imaging of the abdomen and pelvis was performed following the standard protocol without IV contrast. COMPARISON:  02/11/2008 FINDINGS: Lower chest: Lung bases are clear. No effusions. Heart is normal size. - Hepatobiliary: No focal hepatic abnormality. Gallbladder unremarkable. Pancreas: No focal abnormality or ductal dilatation. Spleen: No focal abnormality.  Normal size. Adrenals/Urinary Tract: Multiple hyperdense and hypodense lesions within the kidneys bilaterally, likely a combination of simple and complex/ hemorrhagic cysts. The largest is in the right no pole measuring 5.6 cm. This previously measured 3.3 cm. Cannot completely exclude that some of these could represent solid renal lesions. No hydronephrosis. 2.1 cm  left adrenal nodule compared to 1.8 cm previously. Right adrenal gland and urinary bladder grossly unremarkable. Stomach/Bowel: Postoperative changes noted within the colon. Right lower quadrant ostomy present. There is Collie Siad is an layering fluid collection noted in the pelvis with surrounding suture lines. This presumably represents a dilated denervation segment of distal bowel, but is difficult to exclude separate pelvic abscess. This measures up to 7.6 cm. Small bowel and stomach decompressed. Vascular/Lymphatic: Diffuse iliac and aortic calcifications. No aneurysm. No adenopathy. Reproductive: Very large bilateral hydroceles, left larger than right. Other: No free fluid or free air. Musculoskeletal: No acute bony abnormality or focal bone lesion. IMPRESSION:  Changes of prior bowel surgery with right lower quadrant ostomy. There is a gas and fluid collection in the pelvis measuring 7.6 cm with surrounding suture. This is intimately associated with the sigmoid colon remnant and may represent a segment of these are patent dilated bowel, but I cannot completely exclude that this is an abscess adjacent to the Hartmann's pouch. Mixture of low-density and high-density lesions within the kidneys bilaterally, enlarging since prior study. The largest cyst in the midpole measuring 5.6 cm compared to 3.3 cm previously. While these may reflect a combination of simple and hemorrhagic/complex cysts, I cannot exclude solid renal lesions/ neoplasms. Recommend further evaluation with contrast-enhanced CT or MRI. Left adrenal nodule, slightly larger than prior study, nonspecific. Aortoiliac atherosclerosis. Large bilateral hydroceles, left greater than right. Electronically Signed   By: Rolm Baptise M.D.   On: 06/07/2016 16:31   Dg Chest Port 1 View  Result Date: 06/07/2016 CLINICAL DATA:  70 year old male with shortness breath and weakness for 2 days. Initial encounter. EXAM: PORTABLE CHEST 1 VIEW COMPARISON:  03/21/2005.  FINDINGS: Portable exam with multiple overlying leads. No infiltrate, congestive heart failure or pneumothorax. No plain film evidence of pulmonary malignancy. Heart size within normal limits. Calcified slightly tortuous aorta. Bilateral shoulder joint degenerative changes. IMPRESSION: No acute abnormality. Aortic atherosclerosis. Electronically Signed   By: Genia Del M.D.   On: 06/07/2016 13:53    EKG & Cardiac Imaging    EKG: NSR, LBBB  Echocardiogram: pending  Assessment & Plan    1. Nonsustained VT: In the setting of septic shock, acidosis and electrolyte abnormalities. Agree with Amiodarone infusion, would continue. Developed some transient bradycardia (rates in the 50's) while receiving bolus.   Would discontinue lidocaine gtt as he is more likely to get toxicity in the setting of sepsis. As his acute illness resolves, likely that his VT will improve.   Echo pending.   2. Septic shock: management per primary team.       Signed, Arbutus Leas, NP 06/08/2016, 8:01 AM Pager: [phone] Patient seen and examined and history reviewed. Agree with above findings and plan. 70 yo BM admitted with sepsis, acidosis, ARF, hyperkalemia, fungating scrotal/testicular abscess and possible intra-abdominal abscess. Since admission he has demonstrated repetitive runs of NSVT. Longest run 38 beats. Different morphologies. Started on amiodarone IV and this am also started on IV lidocaine. VT was asymptomatic. Patient denies any chest pain or SOB. No palpitations or dizziness. Patient had an Echo in March 2006 with normal EF. Mild pulmonary HTN. Was admitted in 2006. Echo at that was very poor quality and EF could not be accurately assessed. Myoview study at that time showed an inferolateral perfusion defect and EF 26%. Cardiac cath showed normal coronary anatomy with normal EDP (LV not assessed). He has had no cardiac follow up since then.  On exam he is an elderly BM who appears chronically  ill. No JVD or bruits, very poor dentition. Lungs clear. CV RRR without gallop or murmur Abdomen with ostomy in place. Fungating testicular mass No edema.   Labs, CXR, Ecg reviewed. Ecg shows NSR with IVCD. QT mildly prolonged.  Impresssion: 1. Recurrent NSVT. This is secondary to multiple metabolic derangements including, severe acidosis, hyperkalemia, sepsis, ARF. Primary goal is to treat underlying conditions. Will continue IV amiodarone for now. Would avoid IV lidocaine due to risk of toxicity. Ectopy should improve with correction of metabolic issues. Will get Echo to assess EF.  2. History of cardiomyopathy. This is unclear bases on  evaluation in 2006. Initial Echo in March 2006 showed normal EF. Repeat in July 2006 unable to assess due to poor study. EF low by Myoview but I am not sure this is accurate. Will repeat Echo and see. Normal coronary anatomy in 2006.  3. ARF 4. Hyperkalemia. 5. Lactic acidosis 6. COPD 7. Testicular/scrotal mass ? Abscess. 8. Possible intra-abdominal abscess   Atlee Villers [place], [place] 06/08/2016 8:33 AM

## 2016-06-08 NOTE — Progress Notes (Signed)
[place] Progress Note Patient Name: [name] DOB: 03-05-1946 MRN: [id number]   Date of Service  06/08/2016  HPI/Events of Note  Runs of V-tach  eICU Interventions  --repeat bolus of amio 150 IV.  --Given 2g Mg; 1 g Ca; ordered labs.      Intervention Category Major Interventions: Arrhythmia - evaluation and management  Laverle Hobby 06/08/2016, 9:26 PM

## 2016-06-08 NOTE — Progress Notes (Signed)
Interim Progress Note:   Patient continues to have runs of Vtach after Lidocaine was started. Attempting to get in touch with cardiology.   Additionally, he is hypotensive. Starting Neo (this was held earlier as patient's MAPs improved after bolus).   Smiley Houseman, MD PGY 3 Family Medicine

## 2016-06-08 NOTE — Consult Note (Signed)
Reason for Consult:  Intraabdominal abscess, large fungating mass right scrotum and beginnings of same on the right perineum   Referring Physician: [name] is an 70 y.o. male.  HPI: Family presented patient to the ED, feeling unwell for about 1 week. Transported to the ED by EMS because he was so weak. On admission to the ED his potassium was >7.5, creatinine was 11, CO2 was 8 there is a new fungating scrotal/testicular mass identified in the ED.  CT shows prior right lower quadrant ostomy. There is gas and fluid collection in the pelvis measuring 7.6 cm surrounding the suture this is associated with the sigmoid colon remanent may represent a segment of patent bowel cannot exclude that this is abscess adjacent to the Hartman's pouch. There are low and high density lesions in the kidney that may be a complex cyst or possible solid renal lesions. There is also large bilateral hydroceles.Currently BP is better on pressors, HR 70's.  No fever recorded so far Patient is acutely ill and we have been asked to evaluate.Creatinine is down to 7.88, K+ 4.8, WBC 8.1.  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 Dr. Fanny Skates.  He later developed and EC fistula with:  1.  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   Extensive lysis of adhesions requiring one hour, Left colectomy with takedown of splenic flexure and closure of rectal stump.  Repair of abdominal wall defect with 7 x 9 inches Bard Collamend implant mesh.03/08/05 Dr. Dalbert Batman.   Past Medical History:  Diagnosis Date  . Arthritis   . CHF (congestive heart failure) ([place])   . Chronic kidney disease, stage 3   . Colon obstruction    Sigmoid  . COPD (chronic obstructive pulmonary disease) ([place])   . Diverticulitis   . Erectile dysfunction   . Fistula    chronic enterocutaneous  . Gout   . Hypertension   . Inguinal hernia   . Osteoarthritis   . Perforated bowel ([place])    perforation of the cecum  . Peritonitis [facility])     Past Surgical History:  Procedure Laterality Date  . COLON SURGERY      No family history on file.  Social History:  reports that he has been smoking.  He does not have any smokeless tobacco history on file. He reports that he does not drink alcohol. His drug history is not on file.  Allergies: No Known Allergies  Medications:  Prior to Admission:  Prescriptions Prior to Admission  Medication Sig Dispense Refill Last Dose  . albuterol-ipratropium (COMBIVENT) 18-103 MCG/ACT inhaler Inhale 1 puff into the lungs 2 (two) times daily.   04-19-16  . allopurinol (ZYLOPRIM) 100 MG tablet Take 100 mg by mouth daily.   05-14-16  . budesonide-formoterol (SYMBICORT) 160-4.5 MCG/ACT inhaler Inhale 2 puffs into the lungs 2 (two) times daily.   03-23-16  . diclofenac sodium (VOLTAREN) 1 % GEL Apply 2 g topically 4 (four) times daily as needed (pain).   02-17-16  . ferrous sulfate 325 (65 FE) MG tablet Take 325 mg by mouth 3 (three) times daily with meals.   04-07-16  . lisinopril (PRINIVIL,ZESTRIL) 20 MG tablet Take 20 mg by mouth daily.   05-04-16  . nystatin (MYCOSTATIN/NYSTOP) powder Apply topically 3 (three) times daily. To affected area   05-14-16  . traMADol (ULTRAM) 50  MG tablet Take 50 mg by mouth 2 (two) times daily as needed for moderate pain or severe pain.    04-06-16  . albuterol (PROVENTIL) (2.5 MG/3ML) 0.083% nebulizer solution Take 2.5 mg by nebulization every 6 (six) hours as needed for wheezing or shortness of breath.     Marland Kitchen amLODipine (NORVASC) 10 MG tablet Take 10 mg by mouth daily.    05-04-16  . carvedilol (COREG) 25 MG tablet Take 25 mg by mouth 2 (two) times daily with a meal.     . furosemide (LASIX) 40 MG tablet Take 40 mg by mouth.     Marland Kitchen HYDROcodone-acetaminophen (NORCO/VICODIN) 5-325 MG per tablet Take 1 tablet by  mouth every 6 (six) hours as needed. 15 tablet 0   . potassium chloride (KLOR-CON) 20 MEQ packet Take 20 mEq by mouth daily.     . sildenafil (VIAGRA) 50 MG tablet Take 50 mg by mouth daily as needed for erectile dysfunction.      Scheduled: . famotidine (PEPCID) IV  20 mg Intravenous Q24H  . fentaNYL      . fentaNYL (SUBLIMAZE) injection  50 mcg Intravenous Once  . heparin  5,000 Units Subcutaneous Q8H  . mouth rinse  15 mL Mouth Rinse BID  . midazolam      . piperacillin-tazobactam (ZOSYN)  IV  2.25 g Intravenous Q8H   Continuous: . sodium chloride 100 mL/hr at 06/07/16 1951  . amiodarone 30 mg/hr (06/08/16 0973)  . fentaNYL infusion INTRAVENOUS    . phenylephrine (NEO-SYNEPHRINE) Adult infusion 70 mcg/min (06/08/16 0828)  .  sodium bicarbonate  infusion 1000 mL 125 mL/hr at 06/08/16 0200   Anti-infectives    Start     Dose/Rate Route Frequency Ordered Stop   06/08/16 0000  piperacillin-tazobactam (ZOSYN) IVPB 2.25 g     2.25 g 100 mL/hr over 30 Minutes Intravenous Every 8 hours 06/07/16 2001     06/07/16 1600  piperacillin-tazobactam (ZOSYN) IVPB 3.375 g     3.375 g 100 mL/hr over 30 Minutes Intravenous  Once 06/07/16 1559 06/07/16 1639      Results for orders placed or performed during the hospital encounter of 06/07/16 (from the past 48 hour(s))  CBC with Differential/Platelet     Status: Abnormal   Collection Time: 06/07/16  1:35 PM  Result Value Ref Range   WBC 12.6 (H) 4.0 - 10.5 K/uL   RBC 5.41 4.22 - 5.81 MIL/uL   Hemoglobin 12.5 (L) 13.0 - 17.0 g/dL   HCT 40.7 39.0 - 52.0 %   MCV 75.2 (L) 78.0 - 100.0 fL   MCH 23.1 (L) 26.0 - 34.0 pg   MCHC 30.7 30.0 - 36.0 g/dL   RDW 21.8 (H) 11.5 - 15.5 %   Platelets 286 150 - 400 K/uL    Comment: PLATELET COUNT CONFIRMED BY SMEAR SPECIMEN CHECKED FOR CLOTS    Neutrophils Relative % 89 %   Lymphocytes Relative 5 %   Monocytes Relative 6 %   Eosinophils Relative 0 %   Basophils Relative 0 %   Neutro Abs 11.2 (H) 1.7 -  7.7 K/uL   Lymphs Abs 0.6 (L) 0.7 - 4.0 K/uL   Monocytes Absolute 0.8 0.1 - 1.0 K/uL   Eosinophils Absolute 0.0 0.0 - 0.7 K/uL   Basophils Absolute 0.0 0.0 - 0.1 K/uL   Smear Review MORPHOLOGY UNREMARKABLE   I-stat troponin, ED     Status: None   Collection Time: 06/07/16  1:45 PM  Result Value Ref Range  Troponin i, poc 0.02 0.00 - 0.08 ng/mL   Comment 3            Comment: Due to the release kinetics of cTnI, a negative result within the first hours of the onset of symptoms does not rule out myocardial infarction with certainty. If myocardial infarction is still suspected, repeat the test at appropriate intervals.   I-Stat CG4 Lactic Acid, ED     Status: Abnormal   Collection Time: 06/07/16  1:47 PM  Result Value Ref Range   Lactic Acid, Venous 1.92 (HH) 0.5 - 1.9 mmol/L  Comprehensive metabolic panel     Status: Abnormal   Collection Time: 06/07/16  2:23 PM  Result Value Ref Range   Sodium 134 (L) 135 - 145 mmol/L   Potassium >7.5 (HH) 3.5 - 5.1 mmol/L    Comment: NO VISIBLE HEMOLYSIS CRITICAL RESULT CALLED TO, READ BACK BY AND VERIFIED WITH: M [facility] 1530 06/07/16 D BRADLEY    Chloride 115 (H) 101 - 111 mmol/L   CO2 8 (L) 22 - 32 mmol/L   Glucose, Bld 133 (H) 65 - 99 mg/dL   BUN 676 (H) 6 - 20 mg/dL   Creatinine, Ser 19.50 (H) 0.61 - 1.24 mg/dL   Calcium 9.9 8.9 - 93.2 mg/dL   Total Protein 8.9 (H) 6.5 - 8.1 g/dL   Albumin 3.5 3.5 - 5.0 g/dL   AST 17 15 - 41 U/L   ALT 31 17 - 63 U/L   Alkaline Phosphatase 103 38 - 126 U/L   Total Bilirubin 0.7 0.3 - 1.2 mg/dL   GFR calc non Af Amer 4 (L) >60 mL/min   GFR calc Af Amer 5 (L) >60 mL/min    Comment: (NOTE) The eGFR has been calculated using the CKD EPI equation. This calculation has not been validated in all clinical situations. eGFR's persistently <60 mL/min signify possible Chronic Kidney Disease.    Anion gap 11 5 - 15  Basic metabolic panel     Status: Abnormal   Collection Time: 06/07/16  5:08 PM  Result  Value Ref Range   Sodium 135 135 - 145 mmol/L   Potassium 6.9 (HH) 3.5 - 5.1 mmol/L    Comment: NO VISIBLE HEMOLYSIS CRITICAL RESULT CALLED TO, READ BACK BY AND VERIFIED WITH: Peggye Fothergill 1742 06/07/16 D BRADLEY    Chloride 118 (H) 101 - 111 mmol/L   CO2 <7 (L) 22 - 32 mmol/L   Glucose, Bld 119 (H) 65 - 99 mg/dL   BUN 671 (H) 6 - 20 mg/dL   Creatinine, Ser 24.58 (H) 0.61 - 1.24 mg/dL   Calcium 9.7 8.9 - 09.9 mg/dL   GFR calc non Af Amer 4 (L) >60 mL/min   GFR calc Af Amer 5 (L) >60 mL/min    Comment: (NOTE) The eGFR has been calculated using the CKD EPI equation. This calculation has not been validated in all clinical situations. eGFR's persistently <60 mL/min signify possible Chronic Kidney Disease.    Anion gap NOT CALCULATED 5 - 15  I-Stat CG4 Lactic Acid, ED     Status: None   Collection Time: 06/07/16  5:17 PM  Result Value Ref Range   Lactic Acid, Venous 1.77 0.5 - 1.9 mmol/L  MRSA PCR Screening     Status: None   Collection Time: 06/07/16  8:33 PM  Result Value Ref Range   MRSA by PCR NEGATIVE NEGATIVE    Comment:        The GeneXpert MRSA  Assay (FDA approved for NASAL specimens only), is one component of a comprehensive MRSA colonization surveillance program. It is not intended to diagnose MRSA infection nor to guide or monitor treatment for MRSA infections.   Glucose, capillary     Status: Abnormal   Collection Time: 06/07/16  9:07 PM  Result Value Ref Range   Glucose-Capillary 111 (H) 65 - 99 mg/dL   Comment 1 Notify RN   Basic metabolic panel     Status: Abnormal   Collection Time: 06/07/16  9:13 PM  Result Value Ref Range   Sodium 141 135 - 145 mmol/L   Potassium 5.6 (H) 3.5 - 5.1 mmol/L    Comment: DELTA CHECK NOTED   Chloride 121 (H) 101 - 111 mmol/L   CO2 9 (L) 22 - 32 mmol/L   Glucose, Bld 127 (H) 65 - 99 mg/dL   BUN 127 (H) 6 - 20 mg/dL   Creatinine, Ser 9.49 (H) 0.61 - 1.24 mg/dL   Calcium 8.9 8.9 - 10.3 mg/dL   GFR calc non Af Amer 5 (L)  >60 mL/min   GFR calc Af Amer 6 (L) >60 mL/min    Comment: (NOTE) The eGFR has been calculated using the CKD EPI equation. This calculation has not been validated in all clinical situations. eGFR's persistently <60 mL/min signify possible Chronic Kidney Disease.    Anion gap 11 5 - 15  Procalcitonin     Status: None   Collection Time: 06/07/16  9:13 PM  Result Value Ref Range   Procalcitonin 0.33 ng/mL    Comment:        Interpretation: PCT (Procalcitonin) <= 0.5 ng/mL: Systemic infection (sepsis) is not likely. Local bacterial infection is possible. (NOTE)         ICU PCT Algorithm               Non ICU PCT Algorithm    ----------------------------     ------------------------------         PCT < 0.25 ng/mL                 PCT < 0.1 ng/mL     Stopping of antibiotics            Stopping of antibiotics       strongly encouraged.               strongly encouraged.    ----------------------------     ------------------------------       PCT level decrease by               PCT < 0.25 ng/mL       >= 80% from peak PCT       OR PCT 0.25 - 0.5 ng/mL          Stopping of antibiotics                                             encouraged.     Stopping of antibiotics           encouraged.    ----------------------------     ------------------------------       PCT level decrease by              PCT >= 0.25 ng/mL       < 80% from peak PCT        AND  PCT >= 0.5 ng/mL            Continuin g antibiotics                                              encouraged.       Continuing antibiotics            encouraged.    ----------------------------     ------------------------------     PCT level increase compared          PCT > 0.5 ng/mL         with peak PCT AND          PCT >= 0.5 ng/mL             Escalation of antibiotics                                          strongly encouraged.      Escalation of antibiotics        strongly encouraged.   Basic metabolic panel     Status: Abnormal     Collection Time: 06/08/16 12:18 AM  Result Value Ref Range   Sodium 143 135 - 145 mmol/L   Potassium 5.3 (H) 3.5 - 5.1 mmol/L   Chloride 121 (H) 101 - 111 mmol/L   CO2 9 (L) 22 - 32 mmol/L   Glucose, Bld 123 (H) 65 - 99 mg/dL   BUN 124 (H) 6 - 20 mg/dL   Creatinine, Ser 8.99 (H) 0.61 - 1.24 mg/dL   Calcium 8.7 (L) 8.9 - 10.3 mg/dL   GFR calc non Af Amer 5 (L) >60 mL/min   GFR calc Af Amer 6 (L) >60 mL/min    Comment: (NOTE) The eGFR has been calculated using the CKD EPI equation. This calculation has not been validated in all clinical situations. eGFR's persistently <60 mL/min signify possible Chronic Kidney Disease.    Anion gap 13 5 - 15  Magnesium     Status: None   Collection Time: 06/08/16 12:18 AM  Result Value Ref Range   Magnesium 2.0 1.7 - 2.4 mg/dL  Basic metabolic panel     Status: Abnormal   Collection Time: 06/08/16  3:19 AM  Result Value Ref Range   Sodium 144 135 - 145 mmol/L   Potassium 4.8 3.5 - 5.1 mmol/L   Chloride 122 (H) 101 - 111 mmol/L   CO2 13 (L) 22 - 32 mmol/L   Glucose, Bld 116 (H) 65 - 99 mg/dL   BUN 118 (H) 6 - 20 mg/dL   Creatinine, Ser 8.51 (H) 0.61 - 1.24 mg/dL   Calcium 8.2 (L) 8.9 - 10.3 mg/dL   GFR calc non Af Amer 6 (L) >60 mL/min   GFR calc Af Amer 6 (L) >60 mL/min    Comment: (NOTE) The eGFR has been calculated using the CKD EPI equation. This calculation has not been validated in all clinical situations. eGFR's persistently <60 mL/min signify possible Chronic Kidney Disease.    Anion gap 9 5 - 15  Troponin I     Status: Abnormal   Collection Time: 06/08/16  3:19 AM  Result Value Ref Range   Troponin I 0.09 (HH) <0.03 ng/mL    Comment: CRITICAL RESULT CALLED  TO, READ BACK BY AND VERIFIED WITH: TUTTLE S,RN 06/08/16 0402 WAYK   Basic metabolic panel     Status: Abnormal   Collection Time: 06/08/16  7:54 AM  Result Value Ref Range   Sodium 144 135 - 145 mmol/L   Potassium 4.8 3.5 - 5.1 mmol/L   Chloride 119 (H) 101 - 111  mmol/L   CO2 14 (L) 22 - 32 mmol/L   Glucose, Bld 142 (H) 65 - 99 mg/dL   BUN 110 (H) 6 - 20 mg/dL   Creatinine, Ser 7.88 (H) 0.61 - 1.24 mg/dL   Calcium 8.2 (L) 8.9 - 10.3 mg/dL   GFR calc non Af Amer 6 (L) >60 mL/min   GFR calc Af Amer 7 (L) >60 mL/min    Comment: (NOTE) The eGFR has been calculated using the CKD EPI equation. This calculation has not been validated in all clinical situations. eGFR's persistently <60 mL/min signify possible Chronic Kidney Disease.    Anion gap 11 5 - 15  Troponin I (q 6hr x 3)     Status: Abnormal   Collection Time: 06/08/16  7:54 AM  Result Value Ref Range   Troponin I 0.11 (HH) <0.03 ng/mL    Comment: CRITICAL VALUE NOTED.  VALUE IS CONSISTENT WITH PREVIOUSLY REPORTED AND CALLED VALUE.  CBC     Status: Abnormal   Collection Time: 06/08/16  7:54 AM  Result Value Ref Range   WBC 8.1 4.0 - 10.5 K/uL   RBC 4.10 (L) 4.22 - 5.81 MIL/uL   Hemoglobin 9.2 (L) 13.0 - 17.0 g/dL    Comment: REPEATED TO VERIFY   HCT 29.8 (L) 39.0 - 52.0 %   MCV 72.7 (L) 78.0 - 100.0 fL   MCH 22.4 (L) 26.0 - 34.0 pg   MCHC 30.9 30.0 - 36.0 g/dL   RDW 21.2 (H) 11.5 - 15.5 %   Platelets 193 150 - 400 K/uL    Comment: REPEATED TO VERIFY PLATELET COUNT CONFIRMED BY SMEAR     Ct Abdomen Pelvis Wo Contrast  Result Date: 06/07/2016 CLINICAL DATA:  Abdominal pain. Scrotal swelling. Evaluate for scrotal mass. EXAM: CT ABDOMEN AND PELVIS WITHOUT CONTRAST TECHNIQUE: Multidetector CT imaging of the abdomen and pelvis was performed following the standard protocol without IV contrast. COMPARISON:  02/11/2008 FINDINGS: Lower chest: Lung bases are clear. No effusions. Heart is normal size. - Hepatobiliary: No focal hepatic abnormality. Gallbladder unremarkable. Pancreas: No focal abnormality or ductal dilatation. Spleen: No focal abnormality.  Normal size. Adrenals/Urinary Tract: Multiple hyperdense and hypodense lesions within the kidneys bilaterally, likely a combination of simple  and complex/ hemorrhagic cysts. The largest is in the right no pole measuring 5.6 cm. This previously measured 3.3 cm. Cannot completely exclude that some of these could represent solid renal lesions. No hydronephrosis. 2.1 cm left adrenal nodule compared to 1.8 cm previously. Right adrenal gland and urinary bladder grossly unremarkable. Stomach/Bowel: Postoperative changes noted within the colon. Right lower quadrant ostomy present. There is Collie Siad is an layering fluid collection noted in the pelvis with surrounding suture lines. This presumably represents a dilated denervation segment of distal bowel, but is difficult to exclude separate pelvic abscess. This measures up to 7.6 cm. Small bowel and stomach decompressed. Vascular/Lymphatic: Diffuse iliac and aortic calcifications. No aneurysm. No adenopathy. Reproductive: Very large bilateral hydroceles, left larger than right. Other: No free fluid or free air. Musculoskeletal: No acute bony abnormality or focal bone lesion. IMPRESSION: Changes of prior bowel surgery with right lower quadrant  ostomy. There is a gas and fluid collection in the pelvis measuring 7.6 cm with surrounding suture. This is intimately associated with the sigmoid colon remnant and may represent a segment of these are patent dilated bowel, but I cannot completely exclude that this is an abscess adjacent to the Hartmann's pouch. Mixture of low-density and high-density lesions within the kidneys bilaterally, enlarging since prior study. The largest cyst in the midpole measuring 5.6 cm compared to 3.3 cm previously. While these may reflect a combination of simple and hemorrhagic/complex cysts, I cannot exclude solid renal lesions/ neoplasms. Recommend further evaluation with contrast-enhanced CT or MRI. Left adrenal nodule, slightly larger than prior study, nonspecific. Aortoiliac atherosclerosis. Large bilateral hydroceles, left greater than right. Electronically Signed   By: Rolm Baptise M.D.   On:  06/07/2016 16:31   Dg Chest Port 1 View  Result Date: 06/07/2016 CLINICAL DATA:  70 year old male with shortness breath and weakness for 2 days. Initial encounter. EXAM: PORTABLE CHEST 1 VIEW COMPARISON:  03/21/2005. FINDINGS: Portable exam with multiple overlying leads. No infiltrate, congestive heart failure or pneumothorax. No plain film evidence of pulmonary malignancy. Heart size within normal limits. Calcified slightly tortuous aorta. Bilateral shoulder joint degenerative changes. IMPRESSION: No acute abnormality. Aortic atherosclerosis. Electronically Signed   By: Genia Del M.D.   On: 06/07/2016 13:53    Review of Systems  Unable to perform ROS: Intubated   Blood pressure 116/61, pulse 72, temperature 99.3 F (37.4 C), temperature source Oral, resp. rate 18, height '5\' 9"'$  (1.753 m), weight 73.5 kg (162 lb 0.6 oz), SpO2 100 %.      Physical Exam  Constitutional: He appears well-developed and well-nourished.  Sedated on Vent with Pressors for BP support.  BP 116/61   Pulse 72   Temp 99.3 F (37.4 C) (Oral)   Resp 18   Ht '5\' 9"'$  (1.753 m)   Wt 73.5 kg (162 lb 0.6 oz)   SpO2 100%   BMI 23.93 kg/m    HENT:  Intubated   Cardiovascular: Regular rhythm, normal heart sounds and intact distal pulses.   Respiratory:  Intubated and sedated.  GI:  Before he was intubated he was having abdominal pain and nursing reports it was very hard to examine.  Currently he is sedated and on full ventilator support.  Abdomen is soft, and easy to examine and I can feel a mass LLQ.  + BS, ileostomy is working well.    Genitourinary:  Genitourinary Comments: Foley in place.  He has the large 8 x 8 cm fungating mass right scrotum.  The perineum around the area is thick and moist.  A picture of the scrotum is above.  The left scrotum is intact, but the skin is also thickened here, moist and is beginning to break down also.  Vaseline gauze placed over both sites.  After cleaning them.  It would  appear he has not been able to wash these areas in some time.  He has bilateral hydrocele's right is much larger than the left.    Musculoskeletal: He exhibits no edema.  Neurological:  Sedated on Ventilator  Skin: Skin is warm.  Psychiatric:  On Vent with sedation.      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  Extensive lysis of adhesions requiring one hour, Left colectomy with takedown of splenic flexure and closure of rectal stump.  Repair of abdominal wall defect with 7 x 9 inches Bard Collamend implant mesh.03/08/05 Dr. Dalbert Batman.   Plan:  Medical management and stabilization for now.  Will review with Dr. Hulen Skains.     Vanisha Whiten 06/08/2016, 9:36 AM

## 2016-06-09 ENCOUNTER — Inpatient Hospital Stay (HOSPITAL_COMMUNITY): Payer: Medicare Other

## 2016-06-09 DIAGNOSIS — A419 Sepsis, unspecified organism: Secondary | ICD-10-CM

## 2016-06-09 DIAGNOSIS — R6521 Severe sepsis with septic shock: Secondary | ICD-10-CM

## 2016-06-09 DIAGNOSIS — I4729 Other ventricular tachycardia: Secondary | ICD-10-CM

## 2016-06-09 DIAGNOSIS — I472 Ventricular tachycardia: Secondary | ICD-10-CM

## 2016-06-09 DIAGNOSIS — N179 Acute kidney failure, unspecified: Secondary | ICD-10-CM

## 2016-06-09 DIAGNOSIS — I420 Dilated cardiomyopathy: Secondary | ICD-10-CM

## 2016-06-09 LAB — BLOOD GAS, ARTERIAL
ACID-BASE DEFICIT: 3.3 mmol/L — AB (ref 0.0–2.0)
BICARBONATE: 20.4 mmol/L (ref 20.0–28.0)
Drawn by: 418751
FIO2: 80
LHR: 18 {breaths}/min
O2 SAT: 99.7 %
PATIENT TEMPERATURE: 98.6
PCO2 ART: 31.4 mmHg — AB (ref 32.0–48.0)
PEEP/CPAP: 5 cmH2O
PH ART: 7.428 (ref 7.350–7.450)
VT: 570 mL
pO2, Arterial: 297 mmHg — ABNORMAL HIGH (ref 83.0–108.0)

## 2016-06-09 LAB — URINE CULTURE: CULTURE: NO GROWTH

## 2016-06-09 LAB — GLUCOSE, CAPILLARY
GLUCOSE-CAPILLARY: 121 mg/dL — AB (ref 65–99)
GLUCOSE-CAPILLARY: 116 mg/dL — AB (ref 65–99)
Glucose-Capillary: 136 mg/dL — ABNORMAL HIGH (ref 65–99)

## 2016-06-09 LAB — CBC
HCT: 29.3 % — ABNORMAL LOW (ref 39.0–52.0)
HEMOGLOBIN: 9.2 g/dL — AB (ref 13.0–17.0)
MCH: 22.3 pg — ABNORMAL LOW (ref 26.0–34.0)
MCHC: 31.4 g/dL (ref 30.0–36.0)
MCV: 70.9 fL — ABNORMAL LOW (ref 78.0–100.0)
Platelets: 156 10*3/uL (ref 150–400)
RBC: 4.13 MIL/uL — AB (ref 4.22–5.81)
RDW: 21 % — ABNORMAL HIGH (ref 11.5–15.5)
WBC: 6.9 10*3/uL (ref 4.0–10.5)

## 2016-06-09 LAB — COMPREHENSIVE METABOLIC PANEL
ALBUMIN: 2.3 g/dL — AB (ref 3.5–5.0)
ALK PHOS: 67 U/L (ref 38–126)
ALT: 17 U/L (ref 17–63)
ANION GAP: 8 (ref 5–15)
AST: 15 U/L (ref 15–41)
BILIRUBIN TOTAL: 0.7 mg/dL (ref 0.3–1.2)
BUN: 82 mg/dL — ABNORMAL HIGH (ref 6–20)
CALCIUM: 8.6 mg/dL — AB (ref 8.9–10.3)
CO2: 22 mmol/L (ref 22–32)
Chloride: 111 mmol/L (ref 101–111)
Creatinine, Ser: 5.43 mg/dL — ABNORMAL HIGH (ref 0.61–1.24)
GFR calc Af Amer: 11 mL/min — ABNORMAL LOW (ref >60)
GFR, EST NON AFRICAN AMERICAN: 10 mL/min — AB (ref >60)
GLUCOSE: 213 mg/dL — AB (ref 65–99)
POTASSIUM: 3.5 mmol/L (ref 3.5–5.1)
Sodium: 141 mmol/L (ref 135–145)
TOTAL PROTEIN: 6.3 g/dL — AB (ref 6.5–8.1)

## 2016-06-09 LAB — MAGNESIUM
MAGNESIUM: 2.4 mg/dL (ref 1.7–2.4)
MAGNESIUM: 2 mg/dL (ref 1.7–2.4)

## 2016-06-09 LAB — PHOSPHORUS: PHOSPHORUS: 3.5 mg/dL (ref 2.5–4.6)

## 2016-06-09 MED ORDER — ORAL CARE MOUTH RINSE
15.0000 mL | Freq: Four times a day (QID) | OROMUCOSAL | Status: DC
  Administered 2016-06-09 – 2016-06-12 (×13): 15 mL via OROMUCOSAL

## 2016-06-09 MED ORDER — VITAL HIGH PROTEIN PO LIQD: 1000.0000 mL | ORAL | Status: DC

## 2016-06-09 MED ORDER — AMIODARONE IV BOLUS ONLY 150 MG/100ML
150.0000 mg | Freq: Once | INTRAVENOUS | Status: AC
  Administered 2016-06-09: 150 mg via INTRAVENOUS

## 2016-06-09 MED ORDER — INSULIN ASPART 100 UNIT/ML ~~LOC~~ SOLN
2.0000 [IU] | SUBCUTANEOUS | Status: DC
  Administered 2016-06-09 – 2016-06-11 (×6): 2 [IU] via SUBCUTANEOUS
  Administered 2016-06-11: 4 [IU] via SUBCUTANEOUS
  Administered 2016-06-11: 2 [IU] via SUBCUTANEOUS
  Administered 2016-06-11: 4 [IU] via SUBCUTANEOUS
  Administered 2016-06-11 – 2016-06-12 (×3): 2 [IU] via SUBCUTANEOUS
  Administered 2016-06-12: 4 [IU] via SUBCUTANEOUS

## 2016-06-09 MED ORDER — PRO-STAT SUGAR FREE PO LIQD
30.0000 mL | Freq: Every day | ORAL | Status: DC
  Administered 2016-06-09 – 2016-06-12 (×4): 30 mL
  Filled 2016-06-09 (×5): qty 30

## 2016-06-09 MED ORDER — VITAL AF 1.2 CAL PO LIQD
1000.0000 mL | ORAL | Status: DC
  Administered 2016-06-09 – 2016-06-12 (×4): 1000 mL
  Filled 2016-06-09 (×3): qty 1000

## 2016-06-09 MED ORDER — CHLORHEXIDINE GLUCONATE 0.12% ORAL RINSE (MEDLINE KIT)
15.0000 mL | Freq: Two times a day (BID) | OROMUCOSAL | Status: DC
  Administered 2016-06-09 – 2016-06-12 (×7): 15 mL via OROMUCOSAL

## 2016-06-09 NOTE — Progress Notes (Signed)
[facility] Progress Note Patient Name: [name] DOB: 05/28/1946 MRN: [id number]   Date of Service  06/09/2016  HPI/Events of Note  NSVT - 18 beat run. Currently on an Amiodarone IV infusion.   eICU Interventions  Will order: 1. Amiodarone 150 mg IV now.  2. Bedside nurse instructed to contact cardiology for further management.      Intervention Category Major Interventions: Arrhythmia - evaluation and management  Rodarius Kichline Eugene 06/09/2016, 5:16 AM

## 2016-06-09 NOTE — Progress Notes (Signed)
PULMONARY / CRITICAL CARE MEDICINE   Name: [name] MRN: [id number] DOB: 1946-06-14    ADMISSION DATE:  06/07/2016 CONSULTATION DATE: 06/07/16  REFERRING MD:  Dr. Winfred Leeds EDP  CHIEF COMPLAINT:  Hyperkalemia  HISTORY OF PRESENT ILLNESS:   70 year old male with PMH as below which is significant for CHF, CKD III, COPD, diverticulitis with perforation and now ostomy since 2009, and arthritis. Per family he has been feeling unwell for probably about a week, but notably on 10/22 he started getting more lethargic. No specific complaints, just "didn't feel well". 10/24 when daughter got to his house he was fairly lethargic so she called EMS and he was brought to the ED where laboratory evaluation significant for K >7.5, CO2 8, Creatinine 11.31. He was temporized and K fell to 6.9. He was seen by nephrology who recommended bicarb and kayexalate and potentially HD if not improving. New fungating scrotal/testicular mass identified in ED. CT abdomen and pelvis demonstrated possible intraabdominal abscess. PCCM asked to admit.   PAST MEDICAL HISTORY :  He  has a past medical history of Arthritis; CHF (congestive heart failure) ([place]); Chronic kidney disease, stage 3; Colon obstruction; COPD (chronic obstructive pulmonary disease) ([place]); Diverticulitis; Erectile dysfunction; Fistula; Gout; Hypertension; Inguinal hernia; Osteoarthritis; Perforated bowel ([place]); and Peritonitis ([place]).  PAST SURGICAL HISTORY: He  has a past surgical history that includes Colon surgery.  No Known Allergies  No current facility-administered medications on file prior to encounter.    Current Outpatient Prescriptions on File Prior to Encounter  Medication Sig  . lisinopril (PRINIVIL,ZESTRIL) 20 MG tablet Take 20 mg by mouth daily.  . traMADol (ULTRAM) 50 MG tablet Take 50 mg by mouth 2 (two) times daily as needed for moderate pain or severe pain.   Marland Kitchen albuterol (PROVENTIL) (2.5 MG/3ML) 0.083% nebulizer solution  Take 2.5 mg by nebulization every 6 (six) hours as needed for wheezing or shortness of breath.  Marland Kitchen amLODipine (NORVASC) 10 MG tablet Take 10 mg by mouth daily.   . carvedilol (COREG) 25 MG tablet Take 25 mg by mouth 2 (two) times daily with a meal.  . furosemide (LASIX) 40 MG tablet Take 40 mg by mouth.  Marland Kitchen HYDROcodone-acetaminophen (NORCO/VICODIN) 5-325 MG per tablet Take 1 tablet by mouth every 6 (six) hours as needed.  . potassium chloride (KLOR-CON) 20 MEQ packet Take 20 mEq by mouth daily.  . sildenafil (VIAGRA) 50 MG tablet Take 50 mg by mouth daily as needed for erectile dysfunction.    FAMILY HISTORY:  His has no family status information on file.    SOCIAL HISTORY: He  reports that he has been smoking.  He does not have any smokeless tobacco history on file. He reports that he does not drink alcohol.  REVIEW OF SYSTEMS:   Denies any chest pain or shortness of breath. Reports he is doing fine.   SUBJECTIVE:  Overnight noted to have hypotension to 70/54 with HR 105. He received 1L bolus overnight (4L total since admission). MAPs improved until patient noted to have increased ectopy; then he was started on Neo-synephrine. Patient was noted to have multiple PVCs and nonsustained Vtach. Due to continued ectopy, he was started on amiodarone bolus plus drip. Despite this, he had 38 run of vtach, and therefore was started on Lidocaine. He continued to have multiple PVCs and nonsustained vtach. Therefore cardiology was called urgently for evaluation. Per cards recommendations, he received second amiodarone bolus (150mg ). Then he became bradycardic with ranges to 55 to  60s. His HR has improved slightly and staying in 60s now up to 70s.   VITAL SIGNS: BP 125/71   Pulse 61   Temp 97.4 F (36.3 C) (Oral)   Resp 18   Ht 5\' 9"  (1.753 m)   Wt 78.1 kg (172 lb 2.9 oz)   SpO2 100%   BMI 25.43 kg/m   HEMODYNAMICS: CVP:  [4 mmHg-11 mmHg] 5 mmHg  VENTILATOR SETTINGS: Vent Mode: PRVC FiO2 (%):   [50 %-100 %] 80 % Set Rate:  [18 bmp] 18 bmp Vt Set:  [570 mL] 570 mL PEEP:  [5 cmH20] 5 cmH20 Plateau Pressure:  [10 cmH20-15 cmH20] 14 cmH20  INTAKE / OUTPUT: I/O last 3 completed shifts: In: 10114.7 [I.V.:9674.7; NG/GT:90; IV Piggyback:350] Out: [id number] [Urine:2700; Stool:1650]  PHYSICAL EXAMINATION: GEN: NAD, awake and alert  HEENT:EOMI, sclera clear  CV: RRR, no murmurs, rubs, or gallops PULM: CTAB, normal effort ABD: Soft, tender to palpation diffusely with some guarding but no rebound. Ostomy site pink and with dark green output, nondistended, NABS SKIN: No rash or cyanosis; warm and well-perfused EXTR: No lower extremity edema or calf tenderness NEURO: Awake, alert   LABS:  BMET  Recent Labs Lab 06/08/16 0754 06/08/16 1453 06/09/16 0223  NA 144 142 141  K 4.8 4.5 3.5  CL 119* 115* 111  CO2 14* 17* 22  BUN 110* 102* 82*  CREATININE 7.88* 7.18* 5.43*  GLUCOSE 142* 167* 213*    Electrolytes  Recent Labs Lab 06/08/16 0018  06/08/16 0754 06/08/16 1453 06/08/16 2320 06/09/16 0223  CALCIUM 8.7*  < > 8.2* 8.2*  --  8.6*  MG 2.0  --   --   --  2.4 2.0  PHOS  --   --   --   --   --  3.5  < > = values in this interval not displayed. CBC  Recent Labs Lab 06/07/16 1335 06/08/16 0754 06/09/16 0223  WBC 12.6* 8.1 6.9  HGB 12.5* 9.2* 9.2*  HCT 40.7 29.8* 29.3*  PLT 286 193 156   Coag's No results for input(s): APTT, INR in the last 168 hours.  Sepsis Markers  Recent Labs Lab 06/07/16 1347 06/07/16 1717 06/07/16 2113  LATICACIDVEN 1.92* 1.77  --   PROCALCITON  --   --  0.33   ABG  Recent Labs Lab 06/08/16 1217 06/09/16 0427  PHART 7.304* 7.428  PCO2ART 25.9* 31.4*  PO2ART 201.0* 297*    Liver Enzymes  Recent Labs Lab 06/07/16 1423 06/09/16 0223  AST 17 15  ALT 31 17  ALKPHOS 103 67  BILITOT 0.7 0.7  ALBUMIN 3.5 2.3*   Cardiac Enzymes  Recent Labs Lab 06/08/16 0319 06/08/16 0754 06/08/16 1453  TROPONINI 0.09* 0.11* 0.12*    Glucose  Recent Labs Lab 06/07/16 2107 06/08/16 0812 06/08/16 1134 06/08/16 1934  GLUCAP 111* 128* 129* 205*    STUDIES:  CT Abd/Pelvis 10/24 > Changes of prior bowel surgery with right lower quadrant ostomy. There is a gas and fluid collection in the pelvis measuring 7.6 cm with surrounding suture. This is intimately associated with the sigmoid colon remnant and may represent a segment of these are patent dilated bowel, but I cannot completely exclude that this is an abscess adjacent to the Hartmann's pouch. Mixture of low-density and high-density lesions within the kidneys bilaterally, enlarging since prior study. The largest cyst in the midpole measuring 5.6 cm compared to 3.3 cm previously. While these may reflect a combination of simple and hemorrhagic/complex  cysts, I cannot exclude solid renal lesions/ neoplasms. Recommend further evaluation with contrast-enhanced CT or MRI. Left adrenal nodule, slightly larger than prior study, nonspecific. Aortoiliac atherosclerosis. Large bilateral hydroceles, left greater than right. CXR 10/24: no acute abnormality  CULTURES: Blood Cx 10/24 > Urine 10/24 >  ANTIBIOTICS: Zosyn 10/24 >  SIGNIFICANT EVENTS: 10/24: admit for hyperkalemia, lethargy 10/25: ectopy started on amiodarone bolus and drip. Started on lidocaine bolus and drip. Seen by cardiology  LINES/TUBES:   DISCUSSION: 70 year old male presenting with 4 days lethargy thought to be due to uremic encephalopathy which is now resolved. Found to be in acute on chronic renal failure and profoundly hyperkalemia which has now resolved with Kayexelate and bicarb drip. Also found to have a possible intra-abdominal abscess and was started on Zosyn on admit. Overnight he was noted to have PVC and non-sustained Vtach not controlled with IV amiodarone (bolus and drip) and Lidocaine drip. Also developed hypotension/shock requiring Neo-synephrine likely septic shock. Ectopy with nonsustained  Vtach likely due to sepsis.    ASSESSMENT / PLAN:  CARDIOVASCULAR A:  Hypotension Shock- requiring Neo. Differentials include septic vs cardiogenic (possibly caused by ectopy) Ectopy with nonsustained VT: multiple PVCs and nonsustained vtach which started after electrolytes normalized. Troponin 0.09 H/o CHF H/o HTN P:  Telemetry monitoring Neo MAP > 65 Continue amiodarone drip Cardiology consult appreciated  RENAL A:   Acute kidney injury on CKD III >acute episode likely due to ACE-I, dehydration, lasix: creatinine improving. (unknown baseline)  Hyperkalemia, severe: resolved  NAG acidosis: improving  Lactic Acidosis: resolved  P:   Nephrology following D/C bicarb infusion Foley for urine output monitoring   GASTROINTESTINAL/GENITOURINARY A:   H/o  Bowel perf now with ostomy Testicular/scrotal mass Bilateral hydrocele  P:   Keep NPO for now Pepcid for SUP Consult urology and CCS appreciated, no active interventions at this time  PULMONARY A: At risk for poor airway protection due to AMS: Ams has now resolved  COPD without acute exacerbation  P:   PRN albuterol. Increase PEEP to 10 and decrease FiO2 to 60%. ABG and CXR in AM.  HEMATOLOGIC A:   Leukocytosis -  Possible intra-abdominal abscess?  P:  CBC pending Heparin subcutaneous for VTE ppx  INFECTIOUS A:   Possible intraabdominal abscess: pro-calcitonin 0.33 P:   Zosyn Surgery and urology consult called Follow blood cx and urine cx.  ENDOCRINE A:   No acute issues P:   Monitor with BMP  NEUROLOGIC A:   Acute metabolic encephalopathy in setting of uremia: resolved  P:   RASS goal: 0 to -1 Monitor  FAMILY  - Updates: No family bedside.  - Inter-disciplinary family meet or Palliative Care meeting due by:  10/31  The patient is critically ill with multiple organ systems failure and requires high complexity decision making for assessment and support, frequent evaluation  and titration of therapies, application of advanced monitoring technologies and extensive interpretation of multiple databases.   Critical Care Time devoted to patient care services described in this note is  35  Minutes. This time reflects time of care of this signee Dr Jennet Maduro. This critical care time does not reflect procedure time, or teaching time or supervisory time of PA/NP/Med student/Med Resident etc but could involve care discussion time.  Rush Farmer, M.D. [facility] Pulmonary/Critical Care Medicine. Pager: [phone]. After hours pager: [phone].

## 2016-06-09 NOTE — Progress Notes (Signed)
Patient Name: NICKI GRACY Date of Encounter: 06/09/2016  Primary Cardiologist: Dr. Martinique  Hospital Problem List     Active Problems:   Acute hyperkalemia   Pressure injury of skin   Acute respiratory failure with hypoxia (HCC)   Endotracheally intubated   Scrotal mass   Sepsis ([place])     Subjective   Mr. Furukawa is a 70 year old male with PMH which is significant for CHF (although unclear what last EF is), CKD III, COPD, diverticulitis with perforation and now ostomy since 2009, and arthritis. He presented with lethargy and malaise, found to have K > 7.5, Creatinine was 11.3, had new fungating scrotal/testicular mass and possible intraabdominal mass, developed some non sustained VT and Cardiology was consulted.   Inpatient Medications    Scheduled Meds: . chlorhexidine gluconate (MEDLINE KIT)  15 mL Mouth Rinse BID  . famotidine (PEPCID) IV  20 mg Intravenous Q24H  . heparin  5,000 Units Subcutaneous Q8H  . mouth rinse  15 mL Mouth Rinse QID  . piperacillin-tazobactam (ZOSYN)  IV  2.25 g Intravenous Q8H   Continuous Infusions: . sodium chloride 100 mL/hr at 06/09/16 0751  . amiodarone 30 mg/hr (06/09/16 0525)  . fentaNYL infusion INTRAVENOUS 100 mcg/hr (06/08/16 1900)  . norepinephrine (LEVOPHED) Adult infusion 20 mcg/min (06/09/16 0752)   PRN Meds: sodium chloride, fentaNYL, midazolam, midazolam   Vital Signs    Vitals:   06/09/16 0600 06/09/16 0615 06/09/16 0739 06/09/16 0837  BP: 121/62 (!) 144/70 (!) 142/67   Pulse: (!) 57 60 65   Resp: '15 18 18   '$ Temp:    97.4 F (36.3 C)  TempSrc:    Oral  SpO2: 100% 100% 100%   Weight:      Height:        Intake/Output Summary (Last 24 hours) at 06/09/16 0954 Last data filed at 06/09/16 0800  Gross per 24 hour  Intake          6540.38 ml  Output             2390 ml  Net          4150.38 ml   Filed Weights   06/07/16 1555 06/08/16 0500 06/09/16 0515  Weight: 180 lb (81.6 kg) 162 lb 0.6 oz (73.5 kg) 172  lb 2.9 oz (78.1 kg)    Physical Exam   GEN: Intubated, sedated.  HEENT: Poor dentition, ETT in place.  Neck: Supple, no JVD, carotid bruits, or masses. Cardiac: RRR, no murmurs, rubs, or gallops. No clubbing, cyanosis, edema.  Radials/DP/PT 2+ and equal bilaterally.  Respiratory:  Respirations regular and unlabored, clear to auscultation bilaterally. GI: Soft, nontender, nondistended, BS + x 4. Fungating testicular mass  MS: no deformity or atrophy. Skin: warm and dry, no rash. Neuro:  Strength and sensation are intact. Psych: AAOx3.  Normal affect.  Labs    CBC  Recent Labs  06/07/16 1335 06/08/16 0754 06/09/16 0223  WBC 12.6* 8.1 6.9  NEUTROABS 11.2*  --   --   HGB 12.5* 9.2* 9.2*  HCT 40.7 29.8* 29.3*  MCV 75.2* 72.7* 70.9*  PLT 286 193 073   Basic Metabolic Panel  Recent Labs  06/08/16 1453 06/08/16 2320 06/09/16 0223  NA 142  --  141  K 4.5  --  3.5  CL 115*  --  111  CO2 17*  --  22  GLUCOSE 167*  --  213*  BUN 102*  --  82*  CREATININE  7.18*  --  5.43*  CALCIUM 8.2*  --  8.6*  MG  --  2.4 2.0  PHOS  --   --  3.5   Liver Function Tests  Recent Labs  06/07/16 1423 06/09/16 0223  AST 17 15  ALT 31 17  ALKPHOS 103 67  BILITOT 0.7 0.7  PROT 8.9* 6.3*  ALBUMIN 3.5 2.3*   Cardiac Enzymes  Recent Labs  06/08/16 0319 06/08/16 0754 06/08/16 1453  TROPONINI 0.09* 0.11* 0.12*     Telemetry    NSR with frequent ectopy and runs of NSVT- Personally Reviewed  ECG    NSR, LBBB - Personally Reviewed  Radiology    Ct Abdomen Pelvis Wo Contrast  Result Date: 06/07/2016 CLINICAL DATA:  Abdominal pain. Scrotal swelling. Evaluate for scrotal mass. EXAM: CT ABDOMEN AND PELVIS WITHOUT CONTRAST TECHNIQUE: Multidetector CT imaging of the abdomen and pelvis was performed following the standard protocol without IV contrast. COMPARISON:  02/11/2008 FINDINGS: Lower chest: Lung bases are clear. No effusions. Heart is normal size. - Hepatobiliary: No focal  hepatic abnormality. Gallbladder unremarkable. Pancreas: No focal abnormality or ductal dilatation. Spleen: No focal abnormality.  Normal size. Adrenals/Urinary Tract: Multiple hyperdense and hypodense lesions within the kidneys bilaterally, likely a combination of simple and complex/ hemorrhagic cysts. The largest is in the right no pole measuring 5.6 cm. This previously measured 3.3 cm. Cannot completely exclude that some of these could represent solid renal lesions. No hydronephrosis. 2.1 cm left adrenal nodule compared to 1.8 cm previously. Right adrenal gland and urinary bladder grossly unremarkable. Stomach/Bowel: Postoperative changes noted within the colon. Right lower quadrant ostomy present. There is Collie Siad is an layering fluid collection noted in the pelvis with surrounding suture lines. This presumably represents a dilated denervation segment of distal bowel, but is difficult to exclude separate pelvic abscess. This measures up to 7.6 cm. Small bowel and stomach decompressed. Vascular/Lymphatic: Diffuse iliac and aortic calcifications. No aneurysm. No adenopathy. Reproductive: Very large bilateral hydroceles, left larger than right. Other: No free fluid or free air. Musculoskeletal: No acute bony abnormality or focal bone lesion. IMPRESSION: Changes of prior bowel surgery with right lower quadrant ostomy. There is a gas and fluid collection in the pelvis measuring 7.6 cm with surrounding suture. This is intimately associated with the sigmoid colon remnant and may represent a segment of these are patent dilated bowel, but I cannot completely exclude that this is an abscess adjacent to the Hartmann's pouch. Mixture of low-density and high-density lesions within the kidneys bilaterally, enlarging since prior study. The largest cyst in the midpole measuring 5.6 cm compared to 3.3 cm previously. While these may reflect a combination of simple and hemorrhagic/complex cysts, I cannot exclude solid renal lesions/  neoplasms. Recommend further evaluation with contrast-enhanced CT or MRI. Left adrenal nodule, slightly larger than prior study, nonspecific. Aortoiliac atherosclerosis. Large bilateral hydroceles, left greater than right. Electronically Signed   By: Rolm Baptise M.D.   On: 06/07/2016 16:31   Dg Chest Port 1 View  Result Date: 06/09/2016 CLINICAL DATA:  History of endotracheal tube. Hypertension. Arthritis. COPD. EXAM: PORTABLE CHEST 1 VIEW COMPARISON:  1 a prior FINDINGS: Left internal jugular line is again malpositioned, with tip directed cephalad, with tip in the right brachiocephalic vein. This is unchanged. Endotracheal tube terminates 4.1 cm above carina.Nasogastric tube extends beyond the inferior aspect of the film. Numerous leads and wires project over the chest. Cardiomegaly accentuated by AP portable technique. Probable small left pleural effusion. No pneumothorax.  Mild pulmonary venous congestion. Left base airspace disease persists, improved. IMPRESSION: Cardiomegaly with mild pulmonary venous congestion. Probable small left pleural effusion with improved adjacent atelectasis. Malpositioned left internal jugular line, as detailed on prior exams. Electronically Signed   By: Abigail Miyamoto M.D.   On: 06/09/2016 07:26   Dg Chest Port 1 View  Result Date: 06/08/2016 CLINICAL DATA:  Central line placement EXAM: PORTABLE CHEST 1 VIEW COMPARISON:  06/08/2016 FINDINGS: Stable endotracheal and NG tube position. Again noted left IJ central line going cephalad in right brachiocephalic vein. No pneumothorax. IMPRESSION: Again noted left IJ central line with tip going cephalad in right brachiocephalic vein. No pneumothorax. These results were called by telephone at the time of interpretation on 06/08/2016 at 3:01 pm to patient's ICU nurse Kieth Brightly, who verbally acknowledged these results. Electronically Signed   By: Lahoma Crocker M.D.   On: 06/08/2016 15:02   Dg Chest Port 1 View  Result Date:  06/08/2016 CLINICAL DATA:  Endotracheal tube placement. EXAM: PORTABLE CHEST 1 VIEW COMPARISON:  06/07/2016 FINDINGS: New endotracheal tube tip projects 2.9 cm above the Carina. Nasal/orogastric tube passes to the level of the medial left hemidiaphragm, below the included field of view, likely in the stomach. Left internal jugular central venous line has its tip extending towards the right lung apex, consistent with it extending upward into the right brachiocephalic vein. No pneumothorax.  Lungs are clear. IMPRESSION: 1. Left internal jugular central venous line catheter is malpositioned positioned, with its tip projecting within the right brachiocephalic vein are central right internal jugular vein. 2. Well-positioned endotracheal tube. 3. Nasal/orogastric tube passes below the included field of view likely within the stomach. 4. Lungs remain clear. 5. No pneumothorax. Electronically Signed   By: Lajean Manes M.D.   On: 06/08/2016 12:04   Dg Chest Port 1 View  Result Date: 06/07/2016 CLINICAL DATA:  70 year old male with shortness breath and weakness for 2 days. Initial encounter. EXAM: PORTABLE CHEST 1 VIEW COMPARISON:  03/21/2005. FINDINGS: Portable exam with multiple overlying leads. No infiltrate, congestive heart failure or pneumothorax. No plain film evidence of pulmonary malignancy. Heart size within normal limits. Calcified slightly tortuous aorta. Bilateral shoulder joint degenerative changes. IMPRESSION: No acute abnormality. Aortic atherosclerosis. Electronically Signed   By: Genia Del M.D.   On: 06/07/2016 13:53    Cardiac Studies   Transthoracic Echocardiography 06/08/16 Study Conclusions  - Left ventricle: The cavity size was severely dilated. There was   moderate concentric hypertrophy. Systolic function was severely   reduced. The estimated ejection fraction was in the range of 20%   to 25%. Diffuse hypokinesis. There is akinesis of the   basal-midanteroseptal and  inferoseptal myocardium. There is   akinesis of the entireinferior myocardium. Features are   consistent with a pseudonormal left ventricular filling pattern,   with concomitant abnormal relaxation and increased filling   pressure (grade 2 diastolic dysfunction). Doppler parameters are   consistent with high ventricular filling pressure. - Left atrium: The atrium was severely dilated.  Patient Profile     Mr. Champlain is a 70 year old male with PMH which is significant for CHF (although unclear what last EF is), CKD III, COPD, diverticulitis with perforation and now ostomy since 2009, and arthritis. He presented with lethargy and malaise, found to have K > 7.5, Creatinine was 11.3, had new fungating scrotal/testicular mass and possible intraabdominal mass, developed some non sustained VT and Cardiology was consulted.   Assessment & Plan    1.  Ventricular tachycardia: In the setting of septic shock and ischemic cardiomyopathy. On Amiodarone, continues to have runs of NSVT and has received multiple boluses of '150mg'$  IV Amio. Still with some transient bradycardia. Unfortunately, his VT is likely to persist until his metabolic condition improves. His overall prognosis is poor given his low EF and sepsis. Would continue to avoid lidocaine due to risk of toxicity.   2. 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 Echo shows reduced EF of 20-25%, akinesis of the  basal-midanteroseptal and inferoseptal myocardium. There is akinesis of the entire inferior myocardium. Not a cath candidate at this time.   3. ARF: Renal following, no need for CRRT at this time as GFR is rapidly recovering.   4. Septic shock  5. 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 Agree with above findings and plan. Patient continues to have frequent runs of NSVT. No sustained episodes and no clear symptoms. VT exacerbated by pressors and  multiple metabolic derangements. EF is severely reduced. Remain on Levophed for BP support. Continue Amiodarone. Not a candidate for beta blocker or ACEi due to ARF and hypotension. I would not keep giving boluses of amiodarone unless he has more sustained VT or is symptomatic.   Kellen Dutch [place], [place] 06/09/2016 11:24 AM

## 2016-06-09 NOTE — Progress Notes (Signed)
Pt had a 36 beat run of VTACH.  Pt alert during this.  Pt denies chest pain or discomfort. Elink notified

## 2016-06-09 NOTE — Progress Notes (Signed)
[facility] Progress Note    Subjective: Patient still critically ill and on the ventilator on high settings.  Off pressors.  Amiodarone.  Objective: Vital signs in last 24 hours: Temp:  [97.4 F (36.3 C)-99 F (37.2 C)] 97.4 F (36.3 C) (10/26 0837) Pulse Rate:  [25-86] 65 (10/26 0739) Resp:  [0-23] 18 (10/26 0739) BP: (71-144)/(49-115) 142/67 (10/26 0739) SpO2:  [83 %-100 %] 100 % (10/26 0739) FiO2 (%):  [50 %-100 %] 80 % (10/26 0739) Weight:  [78.1 kg (172 lb 2.9 oz)] 78.1 kg (172 lb 2.9 oz) (10/26 0515) Last BM Date: 06/08/16  Intake/Output from previous day: 10/25 0701 - 10/26 0700 In: 7014.2 [I.V.:6624.2; NG/GT:90; IV Piggyback:300] Out: 2540 [Urine:2140; Stool:400] Intake/Output this shift: Total I/O In: 305.4 [I.V.:275.4; NG/GT:30] Out: 225 [Urine:225]  General: No apparent distress  Lungs: Clear.  FIO2 80%, PEEP 5  Abd: Soft, ostomy is functioning.  Does not seem to be tender on examination.  Should be able to start tube feedings.  Extremities: No changes  Neuro: Intacct  Lab Results:  @LABLAST2 (wbc:2,hgb:2,hct:2,plt:2) BMET ) Recent Labs  06/08/16 1453 06/09/16 0223  NA 142 141  K 4.5 3.5  CL 115* 111  CO2 17* 22  GLUCOSE 167* 213*  BUN 102* 82*  CREATININE 7.18* 5.43*  CALCIUM 8.2* 8.6*   PT/INR No results for input(s): LABPROT, INR in the last 72 hours. ABG  Recent Labs  06/08/16 1217 06/09/16 0427  PHART 7.304* 7.428  HCO3 12.9* 20.4    Studies/Results: Ct Abdomen Pelvis Wo Contrast  Result Date: 06/07/2016 CLINICAL DATA:  Abdominal pain. Scrotal swelling. Evaluate for scrotal mass. EXAM: CT ABDOMEN AND PELVIS WITHOUT CONTRAST TECHNIQUE: Multidetector CT imaging of the abdomen and pelvis was performed following the standard protocol without IV contrast. COMPARISON:  02/11/2008 FINDINGS: Lower chest: Lung bases are clear. No effusions. Heart is normal size. - Hepatobiliary: No focal hepatic abnormality. Gallbladder unremarkable.  Pancreas: No focal abnormality or ductal dilatation. Spleen: No focal abnormality.  Normal size. Adrenals/Urinary Tract: Multiple hyperdense and hypodense lesions within the kidneys bilaterally, likely a combination of simple and complex/ hemorrhagic cysts. The largest is in the right no pole measuring 5.6 cm. This previously measured 3.3 cm. Cannot completely exclude that some of these could represent solid renal lesions. No hydronephrosis. 2.1 cm left adrenal nodule compared to 1.8 cm previously. Right adrenal gland and urinary bladder grossly unremarkable. Stomach/Bowel: Postoperative changes noted within the colon. Right lower quadrant ostomy present. There is Collie Siad is an layering fluid collection noted in the pelvis with surrounding suture lines. This presumably represents a dilated denervation segment of distal bowel, but is difficult to exclude separate pelvic abscess. This measures up to 7.6 cm. Small bowel and stomach decompressed. Vascular/Lymphatic: Diffuse iliac and aortic calcifications. No aneurysm. No adenopathy. Reproductive: Very large bilateral hydroceles, left larger than right. Other: No free fluid or free air. Musculoskeletal: No acute bony abnormality or focal bone lesion. IMPRESSION: Changes of prior bowel surgery with right lower quadrant ostomy. There is a gas and fluid collection in the pelvis measuring 7.6 cm with surrounding suture. This is intimately associated with the sigmoid colon remnant and may represent a segment of these are patent dilated bowel, but I cannot completely exclude that this is an abscess adjacent to the Hartmann's pouch. Mixture of low-density and high-density lesions within the kidneys bilaterally, enlarging since prior study. The largest cyst in the midpole measuring 5.6 cm compared to 3.3 cm previously. While these may reflect a combination of  simple and hemorrhagic/complex cysts, I cannot exclude solid renal lesions/ neoplasms. Recommend further evaluation with  contrast-enhanced CT or MRI. Left adrenal nodule, slightly larger than prior study, nonspecific. Aortoiliac atherosclerosis. Large bilateral hydroceles, left greater than right. Electronically Signed   By: Rolm Baptise M.D.   On: 06/07/2016 16:31   Dg Chest Port 1 View  Result Date: 06/09/2016 CLINICAL DATA:  History of endotracheal tube. Hypertension. Arthritis. COPD. EXAM: PORTABLE CHEST 1 VIEW COMPARISON:  1 a prior FINDINGS: Left internal jugular line is again malpositioned, with tip directed cephalad, with tip in the right brachiocephalic vein. This is unchanged. Endotracheal tube terminates 4.1 cm above carina.Nasogastric tube extends beyond the inferior aspect of the film. Numerous leads and wires project over the chest. Cardiomegaly accentuated by AP portable technique. Probable small left pleural effusion. No pneumothorax. Mild pulmonary venous congestion. Left base airspace disease persists, improved. IMPRESSION: Cardiomegaly with mild pulmonary venous congestion. Probable small left pleural effusion with improved adjacent atelectasis. Malpositioned left internal jugular line, as detailed on prior exams. Electronically Signed   By: Abigail Miyamoto M.D.   On: 06/09/2016 07:26   Dg Chest Port 1 View  Result Date: 06/08/2016 CLINICAL DATA:  Central line placement EXAM: PORTABLE CHEST 1 VIEW COMPARISON:  06/08/2016 FINDINGS: Stable endotracheal and NG tube position. Again noted left IJ central line going cephalad in right brachiocephalic vein. No pneumothorax. IMPRESSION: Again noted left IJ central line with tip going cephalad in right brachiocephalic vein. No pneumothorax. These results were called by telephone at the time of interpretation on 06/08/2016 at 3:01 pm to patient's ICU nurse Kieth Brightly, who verbally acknowledged these results. Electronically Signed   By: Lahoma Crocker M.D.   On: 06/08/2016 15:02   Dg Chest Port 1 View  Result Date: 06/08/2016 CLINICAL DATA:  Endotracheal tube placement.  EXAM: PORTABLE CHEST 1 VIEW COMPARISON:  06/07/2016 FINDINGS: New endotracheal tube tip projects 2.9 cm above the Carina. Nasal/orogastric tube passes to the level of the medial left hemidiaphragm, below the included field of view, likely in the stomach. Left internal jugular central venous line has its tip extending towards the right lung apex, consistent with it extending upward into the right brachiocephalic vein. No pneumothorax.  Lungs are clear. IMPRESSION: 1. Left internal jugular central venous line catheter is malpositioned positioned, with its tip projecting within the right brachiocephalic vein are central right internal jugular vein. 2. Well-positioned endotracheal tube. 3. Nasal/orogastric tube passes below the included field of view likely within the stomach. 4. Lungs remain clear. 5. No pneumothorax. Electronically Signed   By: Lajean Manes M.D.   On: 06/08/2016 12:04   Dg Chest Port 1 View  Result Date: 06/07/2016 CLINICAL DATA:  70 year old male with shortness breath and weakness for 2 days. Initial encounter. EXAM: PORTABLE CHEST 1 VIEW COMPARISON:  03/21/2005. FINDINGS: Portable exam with multiple overlying leads. No infiltrate, congestive heart failure or pneumothorax. No plain film evidence of pulmonary malignancy. Heart size within normal limits. Calcified slightly tortuous aorta. Bilateral shoulder joint degenerative changes. IMPRESSION: No acute abnormality. Aortic atherosclerosis. Electronically Signed   By: Genia Del M.D.   On: 06/07/2016 13:53    Anti-infectives: Anti-infectives    Start     Dose/Rate Route Frequency Ordered Stop   06/08/16 0000  piperacillin-tazobactam (ZOSYN) IVPB 2.25 g     2.25 g 100 mL/hr over 30 Minutes Intravenous Every 8 hours 06/07/16 2001     06/07/16 1600  piperacillin-tazobactam (ZOSYN) IVPB 3.375 g  3.375 g 100 mL/hr over 30 Minutes Intravenous  Once 06/07/16 1559 06/07/16 1639      Assessment/Plan: s/p  Advance diet/tube feedings  can be started.  LOS: 2 days   Kathryne Eriksson. Dahlia Bailiff, MD, FACS [phone] [facility] 06/09/2016

## 2016-06-09 NOTE — Progress Notes (Signed)
Notified Elink and Cardiology regarding pt having two back to back 18 beat runs of [place]. New orders received.

## 2016-06-09 NOTE — Progress Notes (Signed)
Inpatient Diabetes Program Recommendations  AACE/ADA: New Consensus Statement on Inpatient Glycemic Control (2015)  Target Ranges:  Prepandial:   less than 140 mg/dL      Peak postprandial:   less than 180 mg/dL (1-2 hours)      Critically ill patients:  140 - 180 mg/dL   Lab Results  Component Value Date   GLUCAP 205 (H) 06/08/2016    Review of Glycemic Control  Diabetes history: None Current orders for Inpatient glycemic control: None  Inpatient Diabetes Program Recommendations:   Patient NPO on Ventilator. Glucose last night 205 mg/dl. Lab glucose this am 213 mg/dl at 0223 am. Please consider ICU Glycemic Control Protocol SQ insulin Novolog 1-3 units Q4hrs.  Thanks,  Tama Headings RN, MSN, [facility] Inpatient Diabetes Coordinator Team Pager [phone] (8a-5p)

## 2016-06-09 NOTE — Progress Notes (Signed)
Initial Nutrition Assessment  DOCUMENTATION CODES:   Not applicable  INTERVENTION:    Initiate Vital AF 1.2 formula at goal rate of 60 ml/h (1440 ml per day) and Prostat 30 ml daily to provide 1828 kcals, 123 gm protein, 1168 ml free water daily.  NUTRITION DIAGNOSIS:   Inadequate oral intake related to inability to eat as evidenced by NPO status  GOAL:   Patient will meet greater than or equal to 90% of their needs  MONITOR:   TF tolerance, Vent status, Labs, Weight trends, I & O's  REASON FOR ASSESSMENT:   Consult  Enteral/tube feeding initiation and management   ASSESSMENT:   70 yo Male with PMH significant for CHF, CKD III, COPD, diverticulitis with perforation and now ostomy since 2009.  Per family he has been feeling unwell for probably about a week, but notably on 10/22 he started getting more lethargic. 10/24 when daughter got to his house he was fairly lethargic so she called EMS and he was brought to the ED where laboratory evaluation significant for K >7.5, CO2 8, Creatinine 11.31. He was temporized and K fell to 6.9. He was seen by nephrology who recommended bicarb and kayexalate and potentially HD if not improving. New fungating scrotal/testicular mass identified in ED. CT abdomen and pelvis demonstrated possible intraabdominal abscess.  Patient is currently intubated on ventilator support >> OGT in place MV: 10.3 L/min Temp (24hrs), Avg:98 F (36.7 C), Min:97.4 F (36.3 C), Max:98.7 F (37.1 C)  CCM note reviewed. Pt has COPD with acute exacerbation. No nutrition problems identified PTA. Labs and medications reviewed.  Nutrition focused physical exam completed.  No muscle or subcutaneous fat depletion noticed.  Diet Order:  Diet NPO time specified  Skin:  Reviewed, no issues   CBG (last 3)   Recent Labs  06/08/16 1134 06/08/16 1934 06/09/16 1232  GLUCAP 129* 205* 136*   Last BM:  10/25  Height:   Ht Readings from Last 1 Encounters:   06/07/16 5\' 9"  (1.753 m)    Weight:   Wt Readings from Last 1 Encounters:  06/09/16 172 lb 2.9 oz (78.1 kg)    Ideal Body Weight:  73 kg  BMI:  Body mass index is 25.43 kg/m.  Estimated Nutritional Needs:   Kcal:  1770  Protein:  120-130 gm  Fluid:  per MD  EDUCATION NEEDS:   No education needs identified at this time  Arthur Holms, RD, LDN Pager #: [phone] After-Hours Pager #: [phone]

## 2016-06-09 NOTE — Progress Notes (Signed)
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 chlorhexidine gluconate (MEDLINE KIT)  15 mL Mouth Rinse BID  . famotidine (PEPCID) IV  20 mg Intravenous Q24H  . heparin  5,000 Units Subcutaneous Q8H  . mouth rinse  15 mL Mouth Rinse QID  . piperacillin-tazobactam (ZOSYN)  IV  2.25 g Intravenous Q8H   Continuous Infusions: . sodium chloride 100 mL/hr at 06/09/16 0751  . amiodarone 30 mg/hr (06/09/16 0525)  . fentaNYL infusion INTRAVENOUS 100 mcg/hr (06/08/16 1900)  . norepinephrine (LEVOPHED) Adult infusion 20 mcg/min (06/09/16 0752)  .  sodium bicarbonate  infusion 1000 mL 125 mL/hr at 06/09/16 0244   PRN Meds:.sodium chloride, fentaNYL, midazolam, midazolam  Current Labs: reviewed    Physical Exam:  Blood pressure (!) 142/67, pulse 65, temperature 98.7 F (37.1 C), temperature source Oral, resp. rate 18, height '5\' 9"'$  (1.753 m), weight 78.1 kg (172 lb 2.9 oz), SpO2 100 %. GEN: dissheveled, unkempt, lying in stretcher ENT: dry MM, poor dentition EYES: sunken, EOMI CV: RRR PULM: CTAB ABD: RLQ Ostomy with brown liquid stool SKIN: R scrotum with fungating mass EXT: No LEE  A 1. Nonoliguric AoCKD3; resolving 1. BL SCr 1.7 2. CT A/P 10/24 w/o obstruction 3. Likely 2/2 hypovolemia + ACEi + furosemide 2. Severe Hyperkalemia 2/2 Medications, low GFR, Acidosis; resolved 3. NAG Metabolic Acidosis;  improved with HCO3 replacmement 4. 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 Renal masses -- needs outpt urology eval 6. Recent unexplained anemia as outpatient with FOBT+ x3 7. HTN 8. CHF 9. NSVT on Amio/Lidocaine 10. Hypotension / Shock; Septic; on PE  P 1. StopNaHCO3 infusion 2. Unlikely to require RRT given more rapid recovery of GFR 3. Daily weights, Daily Renal Panel, Strict I/Os, Avoid nephrotoxins (NSAIDs, judicious IV Contrast)    Pearson Grippe MD 06/09/2016, 8:12 AM   Recent Labs Lab 06/08/16 0754 06/08/16 1453 06/09/16 0223  NA 144 142 141  K 4.8 4.5 3.5  CL 119* 115* 111  CO2 14* 17* 22  GLUCOSE 142* 167* 213*  BUN 110* 102* 82*  CREATININE 7.88* 7.18* 5.43*  CALCIUM 8.2* 8.2* 8.6*  PHOS  --   --  3.5    Recent Labs Lab 06/07/16 1335 06/08/16 0754 06/09/16 0223  WBC 12.6* 8.1 6.9  NEUTROABS 11.2*  --   --   HGB 12.5* 9.2* 9.2*  HCT 40.7 29.8* 29.3*  MCV 75.2* 72.7* 70.9*  PLT 286 193 156

## 2016-06-10 ENCOUNTER — Inpatient Hospital Stay (HOSPITAL_COMMUNITY): Payer: Medicare Other

## 2016-06-10 DIAGNOSIS — R6521 Severe sepsis with septic shock: Secondary | ICD-10-CM

## 2016-06-10 LAB — BLOOD GAS, ARTERIAL
ACID-BASE DEFICIT: 1.7 mmol/L (ref 0.0–2.0)
Bicarbonate: 22.3 mmol/L (ref 20.0–28.0)
DRAWN BY: 331761
FIO2: 50
MECHVT: 570 mL
O2 Saturation: 99.1 %
PEEP/CPAP: 10 cmH2O
PH ART: 7.401 (ref 7.350–7.450)
PO2 ART: 197 mmHg — AB (ref 83.0–108.0)
Patient temperature: 98.8
RATE: 18 resp/min
pCO2 arterial: 36.7 mmHg (ref 32.0–48.0)

## 2016-06-10 LAB — GLUCOSE, CAPILLARY
GLUCOSE-CAPILLARY: 130 mg/dL — AB (ref 65–99)
GLUCOSE-CAPILLARY: 130 mg/dL — AB (ref 65–99)
GLUCOSE-CAPILLARY: 139 mg/dL — AB (ref 65–99)
Glucose-Capillary: 136 mg/dL — ABNORMAL HIGH (ref 65–99)
Glucose-Capillary: 117 mg/dL — ABNORMAL HIGH (ref 65–99)
Glucose-Capillary: 83 mg/dL (ref 65–99)

## 2016-06-10 LAB — CBC
HCT: 28.3 % — ABNORMAL LOW (ref 39.0–52.0)
Hemoglobin: 8.7 g/dL — ABNORMAL LOW (ref 13.0–17.0)
MCH: 22.5 pg — ABNORMAL LOW (ref 26.0–34.0)
MCHC: 30.7 g/dL (ref 30.0–36.0)
MCV: 73.1 fL — ABNORMAL LOW (ref 78.0–100.0)
PLATELETS: 125 10*3/uL — AB (ref 150–400)
RBC: 3.87 MIL/uL — ABNORMAL LOW (ref 4.22–5.81)
RDW: 21.4 % — AB (ref 11.5–15.5)
WBC: 7 10*3/uL (ref 4.0–10.5)

## 2016-06-10 LAB — BASIC METABOLIC PANEL
ANION GAP: 4 — AB (ref 5–15)
BUN: 56 mg/dL — ABNORMAL HIGH (ref 6–20)
CALCIUM: 7.7 mg/dL — AB (ref 8.9–10.3)
CO2: 25 mmol/L (ref 22–32)
CREATININE: 3.43 mg/dL — AB (ref 0.61–1.24)
Chloride: 115 mmol/L — ABNORMAL HIGH (ref 101–111)
GFR, EST AFRICAN AMERICAN: 19 mL/min — AB (ref >60)
GFR, EST NON AFRICAN AMERICAN: 17 mL/min — AB (ref >60)
Glucose, Bld: 136 mg/dL — ABNORMAL HIGH (ref 65–99)
Potassium: 3.5 mmol/L (ref 3.5–5.1)
SODIUM: 144 mmol/L (ref 135–145)

## 2016-06-10 LAB — PHOSPHORUS: PHOSPHORUS: 2.8 mg/dL (ref 2.5–4.6)

## 2016-06-10 LAB — MAGNESIUM: MAGNESIUM: 1.6 mg/dL — AB (ref 1.7–2.4)

## 2016-06-10 LAB — COOXEMETRY PANEL
CARBOXYHEMOGLOBIN: 0.8 % (ref 0.5–1.5)
METHEMOGLOBIN: 1 % (ref 0.0–1.5)
O2 Saturation: 64.8 %
Total hemoglobin: 8.6 g/dL — ABNORMAL LOW (ref 12.0–16.0)

## 2016-06-10 MED ORDER — VASOPRESSIN 20 UNIT/ML IV SOLN
0.0300 [IU]/min | INTRAVENOUS | Status: DC
  Administered 2016-06-10 – 2016-06-12 (×3): 0.03 [IU]/min via INTRAVENOUS
  Filled 2016-06-10 (×3): qty 2

## 2016-06-10 MED ORDER — MAGNESIUM SULFATE IN D5W 1-5 GM/100ML-% IV SOLN
1.0000 g | Freq: Once | INTRAVENOUS | Status: AC
  Administered 2016-06-10: 1 g via INTRAVENOUS
  Filled 2016-06-10: qty 100

## 2016-06-10 MED ORDER — POTASSIUM CHLORIDE 20 MEQ PO PACK
20.0000 meq | PACK | Freq: Once | ORAL | Status: AC
  Administered 2016-06-10: 20 meq
  Filled 2016-06-10: qty 1

## 2016-06-10 NOTE — Progress Notes (Signed)
Request received to evaluate CT scan for possible drainage of pelvic air-fluid collection. Chart reviewed including prior surgical history. Imaging reviewed by Dr. Vernard Gambles -Feel this air-fluid collection favors some sort of bowel loop or pouch. There is staple line making up most of the border of this collection and appears contiguous with the adjacent rectal stump. There is no rim enhancement, thickened wall, or inflammatory changes in the surrounding soft tissue to support abscess. Would not recommend perc drain to this area as this would lead to fistulous formation. This was discussed with CCS Dr. Hulen Skains.   Ascencion Dike PA-C Interventional Radiology 06/10/2016 3:17 PM

## 2016-06-10 NOTE — Progress Notes (Signed)
Patient Name: [name] Date of Encounter: 06/10/2016  Primary Cardiologist: Dr. [place]  Hospital Problem List     Active Problems:   Acute hyperkalemia   Pressure injury of skin   Acute respiratory failure with hypoxia (HCC)   Endotracheally intubated   Scrotal mass   Sepsis (HCC)   AKI (acute kidney injury) ([place])   NSVT (nonsustained ventricular tachycardia) ([place])   Dilated cardiomyopathy ([place])   Septic shock (HCC)     Subjective   [name] is a 70 year old male with PMH which is significant for CHF , CKD III, COPD, diverticulitis with perforation and now ostomy since 2009, and arthritis. He presented with lethargy and malaise, found to have K > 7.5, Creatinine was 11.3, developed frequent non sustained VT and Cardiology was consulted.   Inpatient Medications    Scheduled Meds: . chlorhexidine gluconate (MEDLINE KIT)  15 mL Mouth Rinse BID  . famotidine (PEPCID) IV  20 mg Intravenous Q24H  . feeding supplement (PRO-STAT SUGAR FREE 64)  30 mL Per Tube Daily  . heparin  5,000 Units Subcutaneous Q8H  . insulin aspart  2-6 Units Subcutaneous Q4H  . mouth rinse  15 mL Mouth Rinse QID  . piperacillin-tazobactam (ZOSYN)  IV  2.25 g Intravenous Q8H   Continuous Infusions: . sodium chloride 10 mL/hr at 06/10/16 1000  . amiodarone 30 mg/hr (06/10/16 1000)  . feeding supplement (VITAL AF 1.2 CAL) 1,000 mL (06/10/16 1000)  . fentaNYL infusion INTRAVENOUS 100 mcg/hr (06/10/16 1000)  . norepinephrine (LEVOPHED) Adult infusion 15.04 mcg/min (06/10/16 1000)   PRN Meds: sodium chloride, fentaNYL, midazolam, midazolam   Vital Signs    Vitals:   06/10/16 0803 06/10/16 0839 06/10/16 0900 06/10/16 1101  BP: 124/70  (!) 125/54 139/71  Pulse: 72  (!) 56 62  Resp: (!) _0 Temp:  99.1 F (37.3 C)    TempSrc:  Oral    SpO2: 100%  100% 100%  Weight:      Height:        Intake/Output Summary (Last 24 hours) at 06/10/16 1127 Last data filed at 06/10/16  1000  Gross per 24 hour  Intake          4122.15 ml  Output             1375 ml  Net          2747.15 ml   Filed Weights   06/08/16 0500 06/09/16 0515 06/10/16 0403  Weight: 162 lb 0.6 oz (73.5 kg) 172 lb 2.9 oz (78.1 kg) 179 lb 14.3 oz (81.6 kg)    Physical Exam   GEN: Intubated, sedated.  HEENT: Poor dentition, ETT in place.  Neck: Supple, no JVD, carotid bruits, or masses. Cardiac: RRR, no murmurs, rubs, or gallops. No clubbing, cyanosis, edema.  Radials/DP/PT 2+ and equal bilaterally.  Respiratory:  Respirations regular and unlabored, clear to auscultation bilaterally. GI: Soft, nontender, nondistended, BS + x 4. Ostomy in place.  Fungating testicular mass/condyloma  MS: no deformity or atrophy. Skin: warm and dry, no rash. Neuro:  Strength and sensation are intact. Psych: AAOx3.  Normal affect.  Labs    CBC  Recent Labs  06/07/16 1335  06/09/16 0223 06/10/16 0502  WBC 12.6*  < > 6.9 7.0  NEUTROABS 11.2*  --   --   --   HGB 12.5*  < > 9.2* 8.7*  HCT 40.7  < > 29.3* 28.3*  MCV 75.2*  < >  70.9* 73.1*  PLT 286  < > 156 125*  < > = values in this interval not displayed. Basic Metabolic Panel  Recent Labs  06/09/16 0223 06/10/16 0502 06/10/16 0734  NA 141 144  --   K 3.5 3.5  --   CL 111 115*  --   CO2 22 25  --   GLUCOSE 213* 136*  --   BUN 82* 56*  --   CREATININE 5.43* 3.43*  --   CALCIUM 8.6* 7.7*  --   MG 2.0  --  1.6*  PHOS 3.5  --  2.8   Liver Function Tests  Recent Labs  06/07/16 1423 06/09/16 0223  AST 17 15  ALT 31 17  ALKPHOS 103 67  BILITOT 0.7 0.7  PROT 8.9* 6.3*  ALBUMIN 3.5 2.3*   Cardiac Enzymes  Recent Labs  06/08/16 0319 06/08/16 0754 06/08/16 1453  TROPONINI 0.09* 0.11* 0.12*     Telemetry    NSR with  runs of NSVT- longest 33 beats last pm Personally Reviewed  ECG    NSR, LBBB - Personally Reviewed  Radiology    Dg Chest Port 1 View  Result Date: 06/10/2016 CLINICAL DATA:  Endotracheal tube EXAM:  PORTABLE CHEST 1 VIEW COMPARISON:  Yesterday FINDINGS: Endotracheal tube tip between the clavicular heads and carina. Left IJ central line crosses into the right brachiocephalic vein, unchanged. Nasogastric tube reaches the stomach. Worsening left basilar aeration with obscured diaphragm. No pneumothorax or edema. IMPRESSION: 1. Unchanged positioning of tubes and left IJ line with tip at the right brachiocephalic vein. 2. Interval worsening retrocardiac aeration, likely atelectasis. Electronically Signed   By: Monte Fantasia M.D.   On: 06/10/2016 08:44   Dg Chest Port 1 View  Result Date: 06/09/2016 CLINICAL DATA:  History of endotracheal tube. Hypertension. Arthritis. COPD. EXAM: PORTABLE CHEST 1 VIEW COMPARISON:  1 a prior FINDINGS: Left internal jugular line is again malpositioned, with tip directed cephalad, with tip in the right brachiocephalic vein. This is unchanged. Endotracheal tube terminates 4.1 cm above carina.Nasogastric tube extends beyond the inferior aspect of the film. Numerous leads and wires project over the chest. Cardiomegaly accentuated by AP portable technique. Probable small left pleural effusion. No pneumothorax. Mild pulmonary venous congestion. Left base airspace disease persists, improved. IMPRESSION: Cardiomegaly with mild pulmonary venous congestion. Probable small left pleural effusion with improved adjacent atelectasis. Malpositioned left internal jugular line, as detailed on prior exams. Electronically Signed   By: Abigail Miyamoto M.D.   On: 06/09/2016 07:26   Dg Chest Port 1 View  Result Date: 06/08/2016 CLINICAL DATA:  Central line placement EXAM: PORTABLE CHEST 1 VIEW COMPARISON:  06/08/2016 FINDINGS: Stable endotracheal and NG tube position. Again noted left IJ central line going cephalad in right brachiocephalic vein. No pneumothorax. IMPRESSION: Again noted left IJ central line with tip going cephalad in right brachiocephalic vein. No pneumothorax. These results were  called by telephone at the time of interpretation on 06/08/2016 at 3:01 pm to patient's ICU nurse Kieth Brightly, who verbally acknowledged these results. Electronically Signed   By: Lahoma Crocker M.D.   On: 06/08/2016 15:02   Dg Chest Port 1 View  Result Date: 06/08/2016 CLINICAL DATA:  Endotracheal tube placement. EXAM: PORTABLE CHEST 1 VIEW COMPARISON:  06/07/2016 FINDINGS: New endotracheal tube tip projects 2.9 cm above the Carina. Nasal/orogastric tube passes to the level of the medial left hemidiaphragm, below the included field of view, likely in the stomach. Left internal jugular central venous line  has its tip extending towards the right lung apex, consistent with it extending upward into the right brachiocephalic vein. No pneumothorax.  Lungs are clear. IMPRESSION: 1. Left internal jugular central venous line catheter is malpositioned positioned, with its tip projecting within the right brachiocephalic vein are central right internal jugular vein. 2. Well-positioned endotracheal tube. 3. Nasal/orogastric tube passes below the included field of view likely within the stomach. 4. Lungs remain clear. 5. No pneumothorax. Electronically Signed   By: Lajean Manes M.D.   On: 06/08/2016 12:04    Cardiac Studies   Transthoracic Echocardiography 06/08/16 Study Conclusions  - Left ventricle: The cavity size was severely dilated. There was   moderate concentric hypertrophy. Systolic function was severely   reduced. The estimated ejection fraction was in the range of 20%   to 25%. Diffuse hypokinesis. There is akinesis of the   basal-midanteroseptal and inferoseptal myocardium. There is   akinesis of the entireinferior myocardium. Features are   consistent with a pseudonormal left ventricular filling pattern,   with concomitant abnormal relaxation and increased filling   pressure (grade 2 diastolic dysfunction). Doppler parameters are   consistent with high ventricular filling pressure. - Left atrium: The  atrium was severely dilated.  Patient Profile     Mr. Snyders is a 70 year old male with PMH which is significant for CHF , CKD III, COPD, diverticulitis with perforation and now ostomy since 2009, and arthritis. He presented with lethargy and malaise, found to have K > 7.5, Creatinine was 11.3,developed some non sustained VT and Cardiology was consulted.   Assessment & Plan    1. Ventricular tachycardia: In the setting of shock and cardiomyopathy. VT burden has decreased significantly in last 24 hours with correction of metabolic abnormalities and IV  Amiodarone. Now predominantly in sinus rhythm.  Will continue IV amiodarone for now. Not a candidate for beta blocker due to hypotension.  2. Cardiomyopathy: ? ischemic based on Echo findings of reduced EF of 20-25%, akinesis of the  basal-midanteroseptal and inferoseptal myocardium. There is akinesis of the entire inferior myocardium. Cardiac cath in 2006 showed normal coronary anatomy with low EF. Not a cath candidate at this time. Not a candidate for ACEi, beta blocker at this time. Does not appear to be volume overloaded.  3. ARF: Renal following, no need for CRRT at this time as GFR is rapidly recovering.   4. Shock- suspect more septic but could have cardiogenic component. Will check Co-ox today. If output low may consider IV milrinone. Otherwise continue IV Levophed for pressure support.   5. 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

## 2016-06-10 NOTE — Progress Notes (Signed)
PULMONARY / CRITICAL CARE MEDICINE   Name: [name] MRN: [id number] DOB: [birth year]    ADMISSION DATE:  06/07/2016 CONSULTATION DATE: 06/07/16  REFERRING MD:  Dr. Winfred Leeds EDP  CHIEF COMPLAINT:  Hyperkalemia  HISTORY OF PRESENT ILLNESS:   70 year old male with PMH as below which is significant for CHF, CKD III, COPD, diverticulitis with perforation and now ostomy since 2009, and arthritis. Per family he has been feeling unwell for probably about a week, but notably on 10/22 he started getting more lethargic. No specific complaints, just "didn't feel well". 10/24 when daughter got to his house he was fairly lethargic so she called EMS and he was brought to the ED where laboratory evaluation significant for K >7.5, CO2 8, Creatinine 11.31. He was temporized and K fell to 6.9. He was seen by nephrology who recommended bicarb and kayexalate and potentially HD if not improving. New fungating scrotal/testicular mass identified in ED. CT abdomen and pelvis demonstrated possible intraabdominal abscess. PCCM asked to admit on 10/24.   PAST MEDICAL HISTORY :  He  has a past medical history of Arthritis; CHF (congestive heart failure) ([place]); Chronic kidney disease, stage 3; Colon obstruction; COPD (chronic obstructive pulmonary disease) ([place]); Diverticulitis; Erectile dysfunction; Fistula; Gout; Hypertension; Inguinal hernia; Osteoarthritis; Perforated bowel ([place]); and Peritonitis ([place]).  PAST SURGICAL HISTORY: He  has a past surgical history that includes Colon surgery.  No Known Allergies  No current facility-administered medications on file prior to encounter.    Current Outpatient Prescriptions on File Prior to Encounter  Medication Sig  . lisinopril (PRINIVIL,ZESTRIL) 20 MG tablet Take 20 mg by mouth daily.  . traMADol (ULTRAM) 50 MG tablet Take 50 mg by mouth 2 (two) times daily as needed for moderate pain or severe pain.   Marland Kitchen albuterol (PROVENTIL) (2.5 MG/3ML) 0.083% nebulizer  solution Take 2.5 mg by nebulization every 6 (six) hours as needed for wheezing or shortness of breath.  Marland Kitchen amLODipine (NORVASC) 10 MG tablet Take 10 mg by mouth daily.   . carvedilol (COREG) 25 MG tablet Take 25 mg by mouth 2 (two) times daily with a meal.  . furosemide (LASIX) 40 MG tablet Take 40 mg by mouth.  Marland Kitchen HYDROcodone-acetaminophen (NORCO/VICODIN) 5-325 MG per tablet Take 1 tablet by mouth every 6 (six) hours as needed.  . potassium chloride (KLOR-CON) 20 MEQ packet Take 20 mEq by mouth daily.  . sildenafil (VIAGRA) 50 MG tablet Take 50 mg by mouth daily as needed for erectile dysfunction.    FAMILY HISTORY:  His has no family status information on file.    SOCIAL HISTORY: He  reports that he has been smoking.  He does not have any smokeless tobacco history on file. He reports that he does not drink alcohol.  REVIEW OF SYSTEMS:   No pain.   SUBJECTIVE:   Patient continued to have nonsustained short runs of Vtach intermittently and also 36 run last night. Patient was asymptomatic. Has intermittent bradycardia throughout the day and overnight to mid 50s transiently with return to 60s. BP overall stable with MAPs in 70s to 80s.   VITAL SIGNS: BP 105/64   Pulse 63   Temp 98.8 F (37.1 C) (Oral)   Resp 18   Ht 5\' 9"  (1.753 m)   Wt 81.6 kg (179 lb 14.3 oz)   SpO2 100%   BMI 26.57 kg/m   HEMODYNAMICS: CVP:  [5 mmHg-8 mmHg] 8 mmHg  VENTILATOR SETTINGS: Vent Mode: PRVC FiO2 (%):  [50 %-80 %]  50 % Set Rate:  [18 bmp] 18 bmp Vt Set:  [570 mL] 570 mL PEEP:  [5 cmH20-10 cmH20] 10 cmH20 Plateau Pressure:  [14 cmH20-20 cmH20] 19 cmH20  INTAKE / OUTPUT: I/O last 3 completed shifts: In: 8849.5 [I.V.:8429.5; NG/GT:120; IV Piggyback:300] Out: [id number] [Urine:3165; Stool:400]  PHYSICAL EXAMINATION: GEN: NAD, awake and alert intubated  HEENT:EOMI, sclera clear  CV: RRR, no murmurs, rubs, or gallops PULM: CTAB, normal effort, intubated ABD: Soft, no tenderness. Ostomy site pink  and with dark green output, nondistended, NABS GU: scrotal swelling noted, fungating mass SKIN: No rash or cyanosis; warm and well-perfused EXTR: No lower extremity edema or calf tenderness NEURO: Awake, alert   LABS:  BMET  Recent Labs Lab 06/08/16 1453 06/09/16 0223 06/10/16 0502  NA 142 141 144  K 4.5 3.5 3.5  CL 115* 111 115*  CO2 17* 22 25  BUN 102* 82* 56*  CREATININE 7.18* 5.43* 3.43*  GLUCOSE 167* 213* 136*    Electrolytes  Recent Labs Lab 06/08/16 0018  06/08/16 1453 06/08/16 2320 06/09/16 0223 06/10/16 0502  CALCIUM 8.7*  < > 8.2*  --  8.6* 7.7*  MG 2.0  --   --  2.4 2.0  --   PHOS  --   --   --   --  3.5  --   < > = values in this interval not displayed. CBC  Recent Labs Lab 06/08/16 0754 06/09/16 0223 06/10/16 0502  WBC 8.1 6.9 7.0  HGB 9.2* 9.2* 8.7*  HCT 29.8* 29.3* 28.3*  PLT 193 156 125*   Coag's No results for input(s): APTT, INR in the last 168 hours.  Sepsis Markers  Recent Labs Lab 06/07/16 1347 06/07/16 1717 06/07/16 2113  LATICACIDVEN 1.92* 1.77  --   PROCALCITON  --   --  0.33   ABG  Recent Labs Lab 06/08/16 1217 06/09/16 0427  PHART 7.304* 7.428  PCO2ART 25.9* 31.4*  PO2ART 201.0* 297*    Liver Enzymes  Recent Labs Lab 06/07/16 1423 06/09/16 0223  AST 17 15  ALT 31 17  ALKPHOS 103 67  BILITOT 0.7 0.7  ALBUMIN 3.5 2.3*   Cardiac Enzymes  Recent Labs Lab 06/08/16 0319 06/08/16 0754 06/08/16 1453  TROPONINI 0.09* 0.11* 0.12*   Glucose  Recent Labs Lab 06/08/16 1934 06/09/16 1232 06/09/16 1623 06/09/16 2113 06/10/16 0042 06/10/16 0456  GLUCAP 205* 136* 121* 116* 130* 139*    STUDIES:  CT Abd/Pelvis 10/24 > Changes of prior bowel surgery with right lower quadrant ostomy. There is a gas and fluid collection in the pelvis measuring 7.6 cm with surrounding suture. This is intimately associated with the sigmoid colon remnant and may represent a segment of these are patent dilated bowel, but I  cannot completely exclude that this is an abscess adjacent to the Hartmann's pouch. Mixture of low-density and high-density lesions within the kidneys bilaterally, enlarging since prior study. The largest cyst in the midpole measuring 5.6 cm compared to 3.3 cm previously. While these may reflect a combination of simple and hemorrhagic/complex cysts, I cannot exclude solid renal lesions/ neoplasms. Recommend further evaluation with contrast-enhanced CT or MRI. Left adrenal nodule, slightly larger than prior study, nonspecific. Aortoiliac atherosclerosis. Large bilateral hydroceles, left greater than right. CXR 10/24: no acute abnormality ECHO 10/26: EF 20-25%, moderate concentric hypertrophy, diffuse hypokinesis. Akinesis of basal-midanteroseptal and inferoseptal myocardium. G2DD.   CULTURES: Blood Cx 10/24 > no growth x 2 days  Urine 10/24 > no growth final  ANTIBIOTICS: Zosyn 10/24 >  SIGNIFICANT EVENTS: 10/24: admit for hyperkalemia, lethargy 10/25: ectopy started on amiodarone bolus and drip. Started on lidocaine bolus and drip. Seen by cardiology. Seen by urology. Seen by surgery.  10/26:   LINES/TUBES: ETT 10/25 >> Central Line 10/25 > PIV  Foley 10/25  DISCUSSION: 70 year old male presenting with 4 days lethargy thought to be due to uremic encephalopathy which is now resolved. Found to be in acute on chronic renal failure and profoundly hyperkalemic thought to be multifactorial with medications and possible dehydration which has now resolved. Also found to have a possible intra-abdominal abscess and was started on Zosyn on admit. He developed shock 10/25 requiring pressor and is currently on Levo. He has had Ectopy with nonsustained Vtach likely due to sepsis per cards.    ASSESSMENT / PLAN:  CARDIOVASCULAR A:  Hypotension Shock- requiring Levo. Differentials include septic vs cardiogenic  Ectopy with nonsustained VT: multiple PVCs and nonsustained vtach which started  after electrolytes normalized. Troponin 0.09 > 0.11 > 0.12 (stable likely demand)  H/o CHF: EF 20-25% with hypokinesis H/o HTN P:  Telemetry monitoring On Levo, MAP > 65 Check CVP daily  Continue amiodarone drip Cardiology consult appreciated  RENAL A:   Acute kidney injury on CKD III >acute episode likely due to ACE-I, dehydration, lasix: creatinine improving. (unknown baseline). Normal urine output  Hyperkalemia, severe: resolved  Hypocalcemia: corrected calcium is normal NAG acidosis: resolved Lactic Acidosis: resolved Bilateral Renal Masses on Imaging: likely simple/complex cysts.   P:   NS @ 100cc/hr Nephrology following Foley for urine output monitoring  Urology recommends non-contrast MRI vs renal US to further evaluate renal masses  GASTROINTESTINAL/GENITOURINARY A:   H/o  Bowel perf now with ostomy Scrotal condylomata  Bilateral hydrocele  P:   Tube Feeds Pepcid for SUP Consult urology and CCS appreciated, no active interventions at this time: outpatient follow up for condylomata. Scrotal US for hydroceles  PULMONARY A: COPD without acute exacerbation  P:   Intubated  PRN albuterol. ABG and CXR pending this AM   HEMATOLOGIC A:   Leukocytosis - mild, now resolved  Anemia: hgb is down-trending slowly. Patient may have been hemo-concentrated on admission (may explain normal hb on admission) No active bleeding noted Thrombocytopenia: Med related? (is on heparin) vs questionable sepsis? 4T score- intermediate probability.   P:  Heparin subcutaneous for VTE ppx Monitor with labs   INFECTIOUS A:   Possible intraabdominal abscess: pro-calcitonin 0.33 P:   Zosyn Surgery and urology following: no surgical intervention indicated currently Follow blood cx and urine cx.  ENDOCRINE A:   No acute issues P:   SSI CBG q 4 while on tube feeds  NEUROLOGIC A:   Acute metabolic encephalopathy in setting of uremia: resolved  P:   RASS goal: 0 to  -1 Monitor Versed PRN   FAMILY  - Updates: No family bedside.  - Inter-disciplinary family meet or Palliative Care meeting due by:  10/31  Smiley Houseman, MD PGY 2 Family Medicine  Rush Farmer, M.D. [facility] Pulmonary/Critical Care Medicine. Pager: [phone]. After hours pager: [phone].

## 2016-06-10 NOTE — Progress Notes (Signed)
Pharmacy Antibiotic Note  [name] is a 70 y.o. male admitted on 06/07/2016 with risk of sepsis from possible intra-abdominal abscess. Pharmacy has been consulted for Zosyn dosing. CT scan revealed possible intra-abdominal abscesses. He received loading dose of Zosyn 3.375 g x1. Patient is afebrile, WBC remained wnl, lactic acid wnl, and PCT 0.33. He has an AKI that is resolving, SCR 11.3 >> 3.43 with CrCl 19.8 mL/min. Depending on SCr tomorrow, may need to increase Zosyn dose.    Plan:  - Continue Zosyn 2.25 g IV q8h - Monitor clinical status, renal function    Height: 5\' 9"  (175.3 cm) Weight: 179 lb 14.3 oz (81.6 kg) IBW/kg (Calculated) : 70.7  Temp (24hrs), Avg:98.7 F (37.1 C), Min:98.2 F (36.8 C), Max:99.1 F (37.3 C)   Recent Labs Lab 06/07/16 1335 06/07/16 1347  06/07/16 1717  06/08/16 0319 06/08/16 0754 06/08/16 1453 06/09/16 0223 06/10/16 0502  WBC 12.6*  --   --   --   --   --  8.1  --  6.9 7.0  CREATININE  --   --   < >  --   < > 8.51* 7.88* 7.18* 5.43* 3.43*  LATICACIDVEN  --  1.92*  --  1.77  --   --   --   --   --   --   < > = values in this interval not displayed.  Estimated Creatinine Clearance: 19.8 mL/min (by C-G formula based on SCr of 3.43 mg/dL (H)).   SCr elevated from unknown baseline  No Known Allergies  Antimicrobials this admission:  Zosyn 10/24 >>  Dose adjustments this admission:  N/A  Microbiology results:  10/24 BCx: NGTD 10/24 UCx: No growth 10/24 MRSA PCR: negative  Thank you for allowing pharmacy to be a part of this patient's care.  Angelena Form, PharmD Candidate 06/10/2016 12:45 PM

## 2016-06-10 NOTE — Progress Notes (Signed)
Patient ID: [name], male   DOB: [birth year], 70 y.o.   MRN: [id number]   LOS: 3 days   Subjective: Sedated, on vent but arouses easily. Denies pain. Back on Levophed.   Objective: Vital signs in last 24 hours: Temp:  [97.9 F (36.6 C)-99.1 F (37.3 C)] 99.1 F (37.3 C) (10/27 0839) Pulse Rate:  [28-72] 56 (10/27 0900) Resp:  [12-28] 12 (10/27 0900) BP: (72-138)/(37-80) 125/54 (10/27 0900) SpO2:  [100 %] 100 % (10/27 0900) FiO2 (%):  [50 %-60 %] 50 % (10/27 0803) Weight:  [81.6 kg (179 lb 14.3 oz)] 81.6 kg (179 lb 14.3 oz) (10/27 0403) Last BM Date: 06/08/16   Laboratory  CBC  Recent Labs  06/09/16 0223 06/10/16 0502  WBC 6.9 7.0  HGB 9.2* 8.7*  HCT 29.3* 28.3*  PLT 156 125*   BMET  Recent Labs  06/09/16 0223 06/10/16 0502  NA 141 144  K 3.5 3.5  CL 111 115*  CO2 22 25  GLUCOSE 213* 136*  BUN 82* 56*  CREATININE 5.43* 3.43*  CALCIUM 8.6* 7.7*     Physical Exam General appearance: alert and no distress Resp: clear to auscultation bilaterally Cardio: irregularly irregular rhythm and mild bradycardia, irreg rhythm 2/2 frequent PVC's GI: Soft, NT, diminished BS, ostomy pink, liquid stool in bag   Assessment/Plan: Intra-abdominal fluid collection -- Tolerating TF and ostomy working well. Afebrile, WBC WNL. If primary team thinks fluid collection responsible for acute illness would recommend perc drain by IR though given benign abd exam and functional ostomy this seems less likely. No surgical indication.    Lisette Abu, PA-C Pager: [phone] 06/10/2016

## 2016-06-10 NOTE — Progress Notes (Signed)
Admit: 06/07/2016 LOS: 3  13M AoCKD3, severe hyperkalemia (on ACEi, KCl, Lasix), NAG metabolic acidosis, scrotal mass, long-term ostomy, hypovolemia  Subjective:  Low EF Remains on NE gtt UOP 1.5L GFR and SCr further improved, K 3.5, HCO3 25   10/26 0701 - 10/27 0700 In: 4354.2 [I.V.:3471.2; NG/GT:783; IV Piggyback:100] Out: 0973 [Urine:1525; Stool:300]  Filed Weights   06/08/16 0500 06/09/16 0515 06/10/16 0403  Weight: 73.5 kg (162 lb 0.6 oz) 78.1 kg (172 lb 2.9 oz) 81.6 kg (179 lb 14.3 oz)    Scheduled Meds: . chlorhexidine gluconate (MEDLINE KIT)  15 mL Mouth Rinse BID  . famotidine (PEPCID) IV  20 mg Intravenous Q24H  . feeding supplement (PRO-STAT SUGAR FREE 64)  30 mL Per Tube Daily  . heparin  5,000 Units Subcutaneous Q8H  . insulin aspart  2-6 Units Subcutaneous Q4H  . mouth rinse  15 mL Mouth Rinse QID  . piperacillin-tazobactam (ZOSYN)  IV  2.25 g Intravenous Q8H   Continuous Infusions: . sodium chloride 100 mL/hr at 06/10/16 0700  . amiodarone 30 mg/hr (06/10/16 0700)  . feeding supplement (VITAL AF 1.2 CAL) 1,000 mL (06/10/16 0700)  . fentaNYL infusion INTRAVENOUS 100 mcg/hr (06/10/16 0700)  . norepinephrine (LEVOPHED) Adult infusion 15.04 mcg/min (06/10/16 0700)   PRN Meds:.sodium chloride, fentaNYL, midazolam, midazolam  Current Labs: reviewed    Physical Exam:  Blood pressure 94/61, pulse 67, temperature 98.8 F (37.1 C), temperature source Oral, resp. rate 18, height '5\' 9"'$  (1.753 m), weight 81.6 kg (179 lb 14.3 oz), SpO2 100 %. GEN: dissheveled, unkempt, lying in stretcher ENT: dry MM, poor dentition EYES: sunken, EOMI CV: RRR PULM: CTAB ABD: RLQ Ostomy with brown liquid stool SKIN: R scrotum with fungating mass EXT: No LEE  A 1. Nonoliguric AoCKD3; resolving 1. BL SCr 1.7 2. CT A/P 10/24 w/o obstruction 3. Likely 2/2 hypovolemia + ACEi + furosemide 2. Severe Hyperkalemia 2/2 Medications, low GFR, Acidosis; resolved 3. NAG Metabolic Acidosis;  improved with HCO3 replacmement 4. 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 Renal masses -- needs outpt urology eval 6. Recent unexplained anemia as outpatient with FOBT+ x3 7. HTN 8. sCHF EF 20-25% 9. NSVT on Amio/Lidocaine 10. Hypotension / Shock; Septic; on PE  P 1. Rapidly recovered renal function, metabolically improved 2. Daily weights, Daily Renal Panel, Strict I/Os, Avoid nephrotoxins (NSAIDs, judicious IV Contrast)  3. Will s/o for now. Call with any questions or concerns.     Pearson Grippe MD 06/10/2016, 8:05 AM   Recent Labs Lab 06/08/16 1453 06/09/16 0223 06/10/16 0502 06/10/16 0734  NA 142 141 144  --   K 4.5 3.5 3.5  --   CL 115* 111 115*  --   CO2 17* 22 25  --   GLUCOSE 167* 213* 136*  --   BUN 102* 82* 56*  --   CREATININE 7.18* 5.43* 3.43*  --   CALCIUM 8.2* 8.6* 7.7*  --   PHOS  --  3.5  --  2.8    Recent Labs Lab 06/07/16 1335 06/08/16 0754 06/09/16 0223 06/10/16 0502  WBC 12.6* 8.1 6.9 7.0  NEUTROABS 11.2*  --   --   --   HGB 12.5* 9.2* 9.2* 8.7*  HCT 40.7 29.8* 29.3* 28.3*  MCV 75.2* 72.7* 70.9* 73.1*  PLT 286 193 156 125*

## 2016-06-10 NOTE — Progress Notes (Signed)
Interim Progress Note:  Spoke with IR regarding the need to drain a gas and fluid collection in the pelvis. IR reports may not be able to do procedure until tomorrow but to place order.   Smiley Houseman, MD PGY 2 Family Medicine

## 2016-06-10 NOTE — Care Management Note (Signed)
Case Management Note  Patient Details  Name: [name] MRN: [id number] Date of Birth: October 22, 1945  Subjective/Objective:  Admitted for SOB                  Action/Plan:  PTA independent from alone per daughter - pt is intubated.  CM will continue to follow for discharge needs   Expected Discharge Date:                  Expected Discharge Plan:  Home/Self Care  In-House Referral:     Discharge planning Services  CM Consult  Post Acute Care Choice:    Choice offered to:     DME Arranged:    DME Agency:     HH Arranged:    HH Agency:     Status of Service:  In process, will continue to follow  If discussed at Long Length of Stay Meetings, dates discussed:    Additional Comments:  Maryclare Labrador, RN 06/10/2016, 3:48 PM

## 2016-06-10 NOTE — Progress Notes (Signed)
PULMONARY / CRITICAL CARE MEDICINE   Name: [name] MRN: [id number] DOB: 1946/06/20    ADMISSION DATE:  06/07/2016 CONSULTATION DATE: 06/07/16  REFERRING MD:  Dr. Winfred Leeds EDP  CHIEF COMPLAINT:  Hyperkalemia  HISTORY OF PRESENT ILLNESS:   70 year old male with PMH as below which is significant for CHF, CKD III, COPD, diverticulitis with perforation and now ostomy since 2009, and arthritis. Per family he has been feeling unwell for probably about a week, but notably on 10/22 he started getting more lethargic. No specific complaints, just "didn't feel well". 10/24 when daughter got to his house he was fairly lethargic so she called EMS and he was brought to the ED where laboratory evaluation significant for K >7.5, CO2 8, Creatinine 11.31. He was temporized and K fell to 6.9. He was seen by nephrology who recommended bicarb and kayexalate and potentially HD if not improving. New fungating scrotal/testicular mass identified in ED. CT abdomen and pelvis demonstrated possible intraabdominal abscess. PCCM asked to admit.   PAST MEDICAL HISTORY :  He  has a past medical history of Arthritis; CHF (congestive heart failure) ([place]); Chronic kidney disease, stage 3; Colon obstruction; COPD (chronic obstructive pulmonary disease) ([place]); Diverticulitis; Erectile dysfunction; Fistula; Gout; Hypertension; Inguinal hernia; Osteoarthritis; Perforated bowel ([place]); and Peritonitis ([place]).  PAST SURGICAL HISTORY: He  has a past surgical history that includes Colon surgery.  No Known Allergies  No current facility-administered medications on file prior to encounter.    Current Outpatient Prescriptions on File Prior to Encounter  Medication Sig  . lisinopril (PRINIVIL,ZESTRIL) 20 MG tablet Take 20 mg by mouth daily.  . traMADol (ULTRAM) 50 MG tablet Take 50 mg by mouth 2 (two) times daily as needed for moderate pain or severe pain.   Marland Kitchen albuterol (PROVENTIL) (2.5 MG/3ML) 0.083% nebulizer solution  Take 2.5 mg by nebulization every 6 (six) hours as needed for wheezing or shortness of breath.  Marland Kitchen amLODipine (NORVASC) 10 MG tablet Take 10 mg by mouth daily.   . carvedilol (COREG) 25 MG tablet Take 25 mg by mouth 2 (two) times daily with a meal.  . furosemide (LASIX) 40 MG tablet Take 40 mg by mouth.  Marland Kitchen HYDROcodone-acetaminophen (NORCO/VICODIN) 5-325 MG per tablet Take 1 tablet by mouth every 6 (six) hours as needed.  . potassium chloride (KLOR-CON) 20 MEQ packet Take 20 mEq by mouth daily.  . sildenafil (VIAGRA) 50 MG tablet Take 50 mg by mouth daily as needed for erectile dysfunction.    FAMILY HISTORY:  His has no family status information on file.    SOCIAL HISTORY: He  reports that he has been smoking.  He does not have any smokeless tobacco history on file. He reports that he does not drink alcohol.  REVIEW OF SYSTEMS:   Denies any chest pain or shortness of breath. Reports he is doing fine.   SUBJECTIVE:  Overnight noted to have hypotension to 70/54 with HR 105. He received 1L bolus overnight (4L total since admission). MAPs improved until patient noted to have increased ectopy; then he was started on Neo-synephrine. Patient was noted to have multiple PVCs and nonsustained Vtach. Due to continued ectopy, he was started on amiodarone bolus plus drip. Despite this, he had 38 run of vtach, and therefore was started on Lidocaine. He continued to have multiple PVCs and nonsustained vtach. Therefore cardiology was called urgently for evaluation. Per cards recommendations, he received second amiodarone bolus (150mg ). Then he became bradycardic with ranges to 55 to  60s. His HR has improved slightly and staying in 60s now up to 70s.   VITAL SIGNS: BP 139/71   Pulse 62   Temp 98.8 F (37.1 C) (Oral)   Resp 18   Ht 5\' 9"  (1.753 m)   Wt 81.6 kg (179 lb 14.3 oz)   SpO2 100%   BMI 26.57 kg/m   HEMODYNAMICS: CVP:  [8 mmHg-15 mmHg] 15 mmHg  VENTILATOR SETTINGS: Vent Mode: PRVC FiO2  (%):  [50 %-60 %] 50 % Set Rate:  [18 bmp] 18 bmp Vt Set:  [570 mL] 570 mL PEEP:  [5 cmH20-10 cmH20] 5 cmH20 Plateau Pressure:  [14 cmH20-20 cmH20] 14 cmH20  INTAKE / OUTPUT: I/O last 3 completed shifts: In: 7885.6 [I.V.:6762.6; NG/GT:873; IV Piggyback:250] Out: [id number] [Urine:2840; Stool:450]  PHYSICAL EXAMINATION: GEN: NAD, awake and alert  HEENT:EOMI, sclera clear  CV: RRR, no murmurs, rubs, or gallops PULM: CTAB, normal effort ABD: Soft, tender to palpation diffusely with some guarding but no rebound. Ostomy site pink and with dark green output, nondistended, NABS SKIN: No rash or cyanosis; warm and well-perfused EXTR: No lower extremity edema or calf tenderness NEURO: Awake, alert   LABS:  BMET  Recent Labs Lab 06/08/16 1453 06/09/16 0223 06/10/16 0502  NA 142 141 144  K 4.5 3.5 3.5  CL 115* 111 115*  CO2 17* 22 25  BUN 102* 82* 56*  CREATININE 7.18* 5.43* 3.43*  GLUCOSE 167* 213* 136*    Electrolytes  Recent Labs Lab 06/08/16 1453 06/08/16 2320 06/09/16 0223 06/10/16 0502 06/10/16 0734  CALCIUM 8.2*  --  8.6* 7.7*  --   MG  --  2.4 2.0  --  1.6*  PHOS  --   --  3.5  --  2.8   CBC  Recent Labs Lab 06/08/16 0754 06/09/16 0223 06/10/16 0502  WBC 8.1 6.9 7.0  HGB 9.2* 9.2* 8.7*  HCT 29.8* 29.3* 28.3*  PLT 193 156 125*   Coag's No results for input(s): APTT, INR in the last 168 hours.  Sepsis Markers  Recent Labs Lab 06/07/16 1347 06/07/16 1717 06/07/16 2113  LATICACIDVEN 1.92* 1.77  --   PROCALCITON  --   --  0.33   ABG  Recent Labs Lab 06/08/16 1217 06/09/16 0427 06/10/16 0812  PHART 7.304* 7.428 7.401  PCO2ART 25.9* 31.4* 36.7  PO2ART 201.0* 297* 197*    Liver Enzymes  Recent Labs Lab 06/07/16 1423 06/09/16 0223  AST 17 15  ALT 31 17  ALKPHOS 103 67  BILITOT 0.7 0.7  ALBUMIN 3.5 2.3*   Cardiac Enzymes  Recent Labs Lab 06/08/16 0319 06/08/16 0754 06/08/16 1453  TROPONINI 0.09* 0.11* 0.12*    Glucose  Recent Labs Lab 06/09/16 1623 06/09/16 2113 06/10/16 0042 06/10/16 0456 06/10/16 0836 06/10/16 1146  GLUCAP 121* 116* 130* 139* 136* 117*    STUDIES:  CT Abd/Pelvis 10/24 > Changes of prior bowel surgery with right lower quadrant ostomy. There is a gas and fluid collection in the pelvis measuring 7.6 cm with surrounding suture. This is intimately associated with the sigmoid colon remnant and may represent a segment of these are patent dilated bowel, but I cannot completely exclude that this is an abscess adjacent to the Hartmann's pouch. Mixture of low-density and high-density lesions within the kidneys bilaterally, enlarging since prior study. The largest cyst in the midpole measuring 5.6 cm compared to 3.3 cm previously. While these may reflect a combination of simple and hemorrhagic/complex cysts, I cannot exclude solid renal  lesions/ neoplasms. Recommend further evaluation with contrast-enhanced CT or MRI. Left adrenal nodule, slightly larger than prior study, nonspecific. Aortoiliac atherosclerosis. Large bilateral hydroceles, left greater than right. CXR 10/24: no acute abnormality  CULTURES: Blood Cx 10/24 > Urine 10/24 >  ANTIBIOTICS: Zosyn 10/24 >  SIGNIFICANT EVENTS: 10/24: admit for hyperkalemia, lethargy 10/25: ectopy started on amiodarone bolus and drip. Started on lidocaine bolus and drip. Seen by cardiology  LINES/TUBES:   DISCUSSION: 70 year old male presenting with 4 days lethargy thought to be due to uremic encephalopathy which is now resolved. Found to be in acute on chronic renal failure and profoundly hyperkalemia which has now resolved with Kayexelate and bicarb drip. Also found to have a possible intra-abdominal abscess and was started on Zosyn on admit. Overnight he was noted to have PVC and non-sustained Vtach not controlled with IV amiodarone (bolus and drip) and Lidocaine drip. Also developed hypotension/shock requiring Neo-synephrine  likely septic shock. Ectopy with nonsustained Vtach likely due to sepsis.    ASSESSMENT / PLAN:  CARDIOVASCULAR A:  Hypotension Shock- requiring Neo. Differentials include septic vs cardiogenic (possibly caused by ectopy) Ectopy with nonsustained VT: multiple PVCs and nonsustained vtach which started after electrolytes normalized. Troponin 0.09 H/o CHF H/o HTN P:  Telemetry monitoring Levophed MAP > 65 Continue amiodarone drip Cardiology consult appreciated Coox of 67, not likely cardiogenic in nature.  RENAL A:   Acute kidney injury on CKD III >acute episode likely due to ACE-I, dehydration, lasix: creatinine improving. (unknown baseline)  Hyperkalemia, severe: resolved  NAG acidosis: improving  Lactic Acidosis: resolved  P:   Nephrology following D/C bicarb infusion Foley for urine output monitoring   GASTROINTESTINAL/GENITOURINARY A:   H/o  Bowel perf now with ostomy Testicular/scrotal mass Bilateral hydrocele  P:   Keep NPO for now Pepcid for SUP Consult urology and CCS appreciated, no active interventions at this time, consider scrotal U/S to evaluate hydrocele.  PULMONARY A: At risk for poor airway protection due to AMS: Ams has now resolved  COPD without acute exacerbation  P:   PRN albuterol. Increase PEEP to 10 and decrease FiO2 to 60%. ABG and CXR in AM.  HEMATOLOGIC A:   Leukocytosis -  Possible intra-abdominal abscess?  P:  CBC pending Heparin subcutaneous for VTE ppx  INFECTIOUS A:   Possible intraabdominal abscess: pro-calcitonin 0.33 P:   Zosyn. Surgery and urology consult called. Follow blood cx and urine cx. IR to drain collection with air-fluid level.  ENDOCRINE A:   No acute issues P:   Monitor with BMP  NEUROLOGIC A:   Acute metabolic encephalopathy in setting of uremia: resolved  P:   RASS goal: 0 to -1 Monitor  FAMILY  - Updates: No family bedside.  - Inter-disciplinary family meet or Palliative  Care meeting due by:  10/31  The patient is critically ill with multiple organ systems failure and requires high complexity decision making for assessment and support, frequent evaluation and titration of therapies, application of advanced monitoring technologies and extensive interpretation of multiple databases.   Critical Care Time devoted to patient care services described in this note is  35  Minutes. This time reflects time of care of this signee Dr Jennet Maduro. This critical care time does not reflect procedure time, or teaching time or supervisory time of PA/NP/Med student/Med Resident etc but could involve care discussion time.  Rush Farmer, M.D. [facility] Pulmonary/Critical Care Medicine. Pager: [phone]. After hours pager: [phone].

## 2016-06-11 ENCOUNTER — Inpatient Hospital Stay (HOSPITAL_COMMUNITY): Payer: Medicare Other

## 2016-06-11 LAB — CBC
HCT: 26.1 % — ABNORMAL LOW (ref 39.0–52.0)
HEMOGLOBIN: 7.8 g/dL — AB (ref 13.0–17.0)
MCH: 22.7 pg — ABNORMAL LOW (ref 26.0–34.0)
MCHC: 29.9 g/dL — ABNORMAL LOW (ref 30.0–36.0)
MCV: 75.9 fL — ABNORMAL LOW (ref 78.0–100.0)
Platelets: 111 10*3/uL — ABNORMAL LOW (ref 150–400)
RBC: 3.44 MIL/uL — AB (ref 4.22–5.81)
RDW: 21.8 % — ABNORMAL HIGH (ref 11.5–15.5)
WBC: 6.7 10*3/uL (ref 4.0–10.5)

## 2016-06-11 LAB — GLUCOSE, CAPILLARY
GLUCOSE-CAPILLARY: 120 mg/dL — AB (ref 65–99)
GLUCOSE-CAPILLARY: 153 mg/dL — AB (ref 65–99)
Glucose-Capillary: 135 mg/dL — ABNORMAL HIGH (ref 65–99)
Glucose-Capillary: 138 mg/dL — ABNORMAL HIGH (ref 65–99)
Glucose-Capillary: 165 mg/dL — ABNORMAL HIGH (ref 65–99)

## 2016-06-11 LAB — BLOOD GAS, ARTERIAL
ACID-BASE DEFICIT: 1.2 mmol/L (ref 0.0–2.0)
Bicarbonate: 22.7 mmol/L (ref 20.0–28.0)
Drawn by: 398661
FIO2: 40
O2 SAT: 94.4 %
PEEP/CPAP: 5 cmH2O
PH ART: 7.413 (ref 7.350–7.450)
Patient temperature: 98.6
RATE: 18 resp/min
VT: 570 mL
pCO2 arterial: 36.3 mmHg (ref 32.0–48.0)
pO2, Arterial: 74.4 mmHg — ABNORMAL LOW (ref 83.0–108.0)

## 2016-06-11 LAB — PHOSPHORUS: PHOSPHORUS: 1.6 mg/dL — AB (ref 2.5–4.6)

## 2016-06-11 LAB — BASIC METABOLIC PANEL
ANION GAP: 6 (ref 5–15)
ANION GAP: 6 (ref 5–15)
BUN: 40 mg/dL — ABNORMAL HIGH (ref 6–20)
BUN: 34 mg/dL — AB (ref 6–20)
CALCIUM: 7.7 mg/dL — AB (ref 8.9–10.3)
CHLORIDE: 114 mmol/L — AB (ref 101–111)
CO2: 25 mmol/L (ref 22–32)
CO2: 23 mmol/L (ref 22–32)
Calcium: 7.8 mg/dL — ABNORMAL LOW (ref 8.9–10.3)
Chloride: 113 mmol/L — ABNORMAL HIGH (ref 101–111)
Creatinine, Ser: 2.55 mg/dL — ABNORMAL HIGH (ref 0.61–1.24)
Creatinine, Ser: 2.01 mg/dL — ABNORMAL HIGH (ref 0.61–1.24)
GFR calc Af Amer: 37 mL/min — ABNORMAL LOW (ref >60)
GFR calc non Af Amer: 24 mL/min — ABNORMAL LOW (ref >60)
GFR calc non Af Amer: 32 mL/min — ABNORMAL LOW (ref >60)
GFR, EST AFRICAN AMERICAN: 28 mL/min — AB (ref >60)
GLUCOSE: 124 mg/dL — AB (ref 65–99)
Glucose, Bld: 133 mg/dL — ABNORMAL HIGH (ref 65–99)
Potassium: 3.6 mmol/L (ref 3.5–5.1)
Potassium: 3.9 mmol/L (ref 3.5–5.1)
Sodium: 145 mmol/L (ref 135–145)
Sodium: 142 mmol/L (ref 135–145)

## 2016-06-11 LAB — MAGNESIUM
MAGNESIUM: 1.6 mg/dL — AB (ref 1.7–2.4)
Magnesium: 2.1 mg/dL (ref 1.7–2.4)

## 2016-06-11 LAB — CORTISOL: CORTISOL PLASMA: 17.6 ug/dL

## 2016-06-11 MED ORDER — MAGNESIUM SULFATE 4 GM/100ML IV SOLN
4.0000 g | Freq: Once | INTRAVENOUS | Status: AC
Start: 2016-06-11 — End: 2016-06-11
  Administered 2016-06-11: 4 g via INTRAVENOUS
  Filled 2016-06-11: qty 100

## 2016-06-11 MED ORDER — FENTANYL BOLUS VIA INFUSION
25.0000 ug | INTRAVENOUS | Status: DC | PRN
  Administered 2016-06-11: 50 ug via INTRAVENOUS
  Filled 2016-06-11: qty 50

## 2016-06-11 MED ORDER — FENTANYL 2500MCG IN NS 250ML (10MCG/ML) PREMIX INFUSION
50.0000 ug/h | INTRAVENOUS | Status: DC
  Administered 2016-06-11: 350 ug/h via INTRAVENOUS
  Administered 2016-06-11: 50 ug/h via INTRAVENOUS
  Administered 2016-06-12: 75 ug/h via INTRAVENOUS
  Filled 2016-06-11 (×3): qty 250

## 2016-06-11 MED ORDER — DEXTROSE 5 % IV SOLN
30.0000 mmol | Freq: Once | INTRAVENOUS | Status: AC
  Administered 2016-06-11: 30 mmol via INTRAVENOUS
  Filled 2016-06-11: qty 10

## 2016-06-11 MED ORDER — PIPERACILLIN-TAZOBACTAM 3.375 G IVPB
3.3750 g | Freq: Three times a day (TID) | INTRAVENOUS | Status: AC
  Administered 2016-06-11 – 2016-06-14 (×10): 3.375 g via INTRAVENOUS
  Filled 2016-06-11 (×11): qty 50

## 2016-06-11 NOTE — Progress Notes (Signed)
PULMONARY / CRITICAL CARE MEDICINE   Name: [name] MRN: [id number] DOB: 1946/07/01    ADMISSION DATE:  06/07/2016 CONSULTATION DATE: 06/07/16  REFERRING MD:  Dr. Winfred Leeds EDP  CHIEF COMPLAINT:  Hyperkalemia  HISTORY OF PRESENT ILLNESS:   70 year old male with PMH as below which is significant for CHF, CKD III, COPD, diverticulitis with perforation and now ostomy since 2009, and arthritis. Per family he has been feeling unwell for probably about a week, but notably on 10/22 he started getting more lethargic. No specific complaints, just "didn't feel well". 10/24 when daughter got to his house he was fairly lethargic so she called EMS and he was brought to the ED where laboratory evaluation significant for K >7.5, CO2 8, Creatinine 11.31. He was temporized and K fell to 6.9. He was seen by nephrology who recommended bicarb and kayexalate and potentially HD if not improving. New fungating scrotal/testicular mass identified in ED. CT abdomen and pelvis demonstrated possible intraabdominal abscess. PCCM asked to admit.   PAST MEDICAL HISTORY :  He  has a past medical history of Arthritis; CHF (congestive heart failure) ([place]); Chronic kidney disease, stage 3; Colon obstruction; COPD (chronic obstructive pulmonary disease) ([place]); Diverticulitis; Erectile dysfunction; Fistula; Gout; Hypertension; Inguinal hernia; Osteoarthritis; Perforated bowel ([place]); and Peritonitis ([place]).  PAST SURGICAL HISTORY: He  has a past surgical history that includes Colon surgery.  No Known Allergies  No current facility-administered medications on file prior to encounter.    Current Outpatient Prescriptions on File Prior to Encounter  Medication Sig  . lisinopril (PRINIVIL,ZESTRIL) 20 MG tablet Take 20 mg by mouth daily.  . traMADol (ULTRAM) 50 MG tablet Take 50 mg by mouth 2 (two) times daily as needed for moderate pain or severe pain.   Marland Kitchen albuterol (PROVENTIL) (2.5 MG/3ML) 0.083% nebulizer solution  Take 2.5 mg by nebulization every 6 (six) hours as needed for wheezing or shortness of breath.  Marland Kitchen amLODipine (NORVASC) 10 MG tablet Take 10 mg by mouth daily.   . carvedilol (COREG) 25 MG tablet Take 25 mg by mouth 2 (two) times daily with a meal.  . furosemide (LASIX) 40 MG tablet Take 40 mg by mouth.  Marland Kitchen HYDROcodone-acetaminophen (NORCO/VICODIN) 5-325 MG per tablet Take 1 tablet by mouth every 6 (six) hours as needed.  . potassium chloride (KLOR-CON) 20 MEQ packet Take 20 mEq by mouth daily.  . sildenafil (VIAGRA) 50 MG tablet Take 50 mg by mouth daily as needed for erectile dysfunction.    FAMILY HISTORY:  His has no family status information on file.    SOCIAL HISTORY: He  reports that he has been smoking.  He does not have any smokeless tobacco history on file. He reports that he does not drink alcohol.  REVIEW OF SYSTEMS:   Denies any chest pain or shortness of breath. Reports he is doing fine.   SUBJECTIVE:  No events overnight, pressor demand improving.  VITAL SIGNS: BP (!) 113/56   Pulse (!) 57   Temp 98 F (36.7 C) (Oral)   Resp 17   Ht 5\' 9"  (1.753 m)   Wt 83.4 kg (183 lb 13.8 oz)   SpO2 100%   BMI 27.15 kg/m   HEMODYNAMICS: CVP:  [8 mmHg-10 mmHg] 10 mmHg  VENTILATOR SETTINGS: Vent Mode: PRVC FiO2 (%):  [30 %-50 %] 40 % Set Rate:  [18 bmp] 18 bmp Vt Set:  [570 mL] 570 mL PEEP:  [5 cmH20] 5 cmH20 Plateau Pressure:  [14 cmH20-17 cmH20]  16 cmH20  INTAKE / OUTPUT: I/O last 3 completed shifts: In: 5962.6 [I.V.:3069.6; NG/GT:2133; IV Piggyback:760] Out: 2660 [Urine:1130; Emesis/NG output:655; Stool:875]  PHYSICAL EXAMINATION: GEN: NAD, awake and alert  HEENT:EOMI, sclera clear  CV: RRR, no murmurs, rubs, or gallops PULM: CTAB, normal effort ABD: Soft, tender to palpation diffusely with some guarding but no rebound. Ostomy site pink and with dark green output, nondistended, NABS SKIN: No rash or cyanosis; warm and well-perfused EXTR: No lower extremity  edema or calf tenderness NEURO: Awake, alert  LABS:  BMET  Recent Labs Lab 06/09/16 0223 06/10/16 0502 06/11/16 0347  NA 141 144 145  K 3.5 3.5 3.6  CL 111 115* 114*  CO2 22 25 25   BUN 82* 56* 40*  CREATININE 5.43* 3.43* 2.55*  GLUCOSE 213* 136* 133*    Electrolytes  Recent Labs Lab 06/09/16 0223 06/10/16 0502 06/10/16 0734 06/11/16 0347  CALCIUM 8.6* 7.7*  --  7.8*  MG 2.0  --  1.6* 1.6*  PHOS 3.5  --  2.8 1.6*   CBC  Recent Labs Lab 06/09/16 0223 06/10/16 0502 06/11/16 0347  WBC 6.9 7.0 6.7  HGB 9.2* 8.7* 7.8*  HCT 29.3* 28.3* 26.1*  PLT 156 125* 111*   Coag's No results for input(s): APTT, INR in the last 168 hours.  Sepsis Markers  Recent Labs Lab 06/07/16 1347 06/07/16 1717 06/07/16 2113  LATICACIDVEN 1.92* 1.77  --   PROCALCITON  --   --  0.33   ABG  Recent Labs Lab 06/09/16 0427 06/10/16 0812 06/11/16 0454  PHART 7.428 7.401 7.413  PCO2ART 31.4* 36.7 36.3  PO2ART 297* 197* 74.4*   Liver Enzymes  Recent Labs Lab 06/07/16 1423 06/09/16 0223  AST 17 15  ALT 31 17  ALKPHOS 103 67  BILITOT 0.7 0.7  ALBUMIN 3.5 2.3*   Cardiac Enzymes  Recent Labs Lab 06/08/16 0319 06/08/16 0754 06/08/16 1453  TROPONINI 0.09* 0.11* 0.12*   Glucose  Recent Labs Lab 06/10/16 0042 06/10/16 0456 06/10/16 0836 06/10/16 1146 06/10/16 1614 06/10/16 2057  GLUCAP 130* 139* 136* 117* 130* 83   STUDIES:  CT Abd/Pelvis 10/24 > Changes of prior bowel surgery with right lower quadrant ostomy. There is a gas and fluid collection in the pelvis measuring 7.6 cm with surrounding suture. This is intimately associated with the sigmoid colon remnant and may represent a segment of these are patent dilated bowel, but I cannot completely exclude that this is an abscess adjacent to the Hartmann's pouch. Mixture of low-density and high-density lesions within the kidneys bilaterally, enlarging since prior study. The largest cyst in the midpole measuring 5.6  cm compared to 3.3 cm previously. While these may reflect a combination of simple and hemorrhagic/complex cysts, I cannot exclude solid renal lesions/ neoplasms. Recommend further evaluation with contrast-enhanced CT or MRI. Left adrenal nodule, slightly larger than prior study, nonspecific. Aortoiliac atherosclerosis. Large bilateral hydroceles, left greater than right. CXR 10/24: no acute abnormality  CULTURES: Blood Cx 10/24 > Urine 10/24 >  ANTIBIOTICS: Zosyn 10/24 >  SIGNIFICANT EVENTS: 10/24: admit for hyperkalemia, lethargy 10/25: ectopy started on amiodarone bolus and drip. Started on lidocaine bolus and drip. Seen by cardiology  LINES/TUBES:   DISCUSSION: 70 year old male presenting with 4 days lethargy thought to be due to uremic encephalopathy which is now resolved. Found to be in acute on chronic renal failure and profoundly hyperkalemia which has now resolved with Kayexelate and bicarb drip. Also found to have a possible intra-abdominal abscess  and was started on Zosyn on admit. Overnight he was noted to have PVC and non-sustained Vtach not controlled with IV amiodarone (bolus and drip) and Lidocaine drip. Also developed hypotension/shock requiring Neo-synephrine likely septic shock. Ectopy with nonsustained Vtach likely due to sepsis.    ASSESSMENT / PLAN:  CARDIOVASCULAR A:  Hypotension Shock- requiring Neo. Differentials include septic vs cardiogenic (possibly caused by ectopy) Ectopy with nonsustained VT: multiple PVCs and nonsustained vtach which started after electrolytes normalized. Troponin 0.09 H/o CHF H/o HTN P:  Telemetry monitoring. Levophed MAP > 65, down to 5. Continue amiodarone drip. Cardiology consult appreciated. Coox of 67, not likely cardiogenic in nature.  RENAL A:   Acute kidney injury on CKD III >acute episode likely due to ACE-I, dehydration, lasix: creatinine improving. (unknown baseline)  Hyperkalemia, severe: resolved  NAG  acidosis: improving  Lactic Acidosis: resolved  P:   Nephrology following D/C bicarb infusion Foley for urine output monitoring  Mg, K and Phos all replaced.  GASTROINTESTINAL/GENITOURINARY A:   H/o  Bowel perf now with ostomy Testicular/scrotal mass Bilateral hydrocele  P:   Keep NPO for now Pepcid for SUP Consult urology and CCS appreciated, no active interventions at this time, consider scrotal U/S to evaluate hydrocele.  PULMONARY A: At risk for poor airway protection due to AMS: Ams has now resolved  COPD without acute exacerbation  P:   PRN albuterol. PEEP 5 and FiO2 40%. PS trials as able, no extubation til off pressors. ABG and CXR in AM.  HEMATOLOGIC A:   Leukocytosis -  Possible intra-abdominal abscess?  P:  CBC pending Heparin subcutaneous for VTE ppx  INFECTIOUS A:   Possible intraabdominal abscess: pro-calcitonin 0.33 P:   Zosyn. Surgery and urology consults appreciated, no drain via IR given location, see discussion on IR's note. Follow blood cx and urine cx.  ENDOCRINE A:   No acute issues P:   Monitor with BMP  NEUROLOGIC A:   Acute metabolic encephalopathy in setting of uremia: resolved  P:   RASS goal: 0 to -1 Monitor   FAMILY  - Updates: No family bedside.  - Inter-disciplinary family meet or Palliative Care meeting due by:  10/31  The patient is critically ill with multiple organ systems failure and requires high complexity decision making for assessment and support, frequent evaluation and titration of therapies, application of advanced monitoring technologies and extensive interpretation of multiple databases.   Critical Care Time devoted to patient care services described in this note is  37  Minutes. This time reflects time of care of this signee Dr Jennet Maduro. This critical care time does not reflect procedure time, or teaching time or supervisory time of PA/NP/Med student/Med Resident etc but could involve care  discussion time.  Rush Farmer, M.D. [facility] Pulmonary/Critical Care Medicine. Pager: [phone]. After hours pager: [phone].

## 2016-06-11 NOTE — Progress Notes (Signed)
Paged Elink then Cardiology regarding pts HR dropping into the 40s.  Verbal order to decrease Amio to 8mg /hr and advised fellow would come see him.

## 2016-06-11 NOTE — Progress Notes (Signed)
Subjective:  Patient remains intubated, he is alert and able to raise arms to command.  No complaints of chest pain or shortness of breath.  Having a potpourri of arrhythmias.  Objective:  Vital Signs in the last 24 hours: BP (!) 112/56   Pulse (!) 48   Temp 98 F (36.7 C) (Oral)   Resp 16   Ht 5\' 9"  (1.753 m)   Wt 83.4 kg (183 lb 13.8 oz)   SpO2 100%   BMI 27.15 kg/m   Physical Exam: Intubated black male currently in no acute distress Lungs:  Clear Cardiac: Slow Regular rhythm with occasional early beats, normal S1 and S2, no S3 Abdomen:  Soft, nontender, no masses Extremities:  Trace edema present  Intake/Output from previous day: 10/27 0701 - 10/28 0700 In: 3443.7 [I.V.:1403.7; NG/GT:1380; IV Piggyback:660] Out: [id number] [Urine:565; Emesis/NG output:655; Stool:575]  Weight Filed Weights   06/10/16 0403 06/11/16 0030 06/11/16 0338  Weight: 81.6 kg (179 lb 14.3 oz) 82.9 kg (182 lb 12.2 oz) 83.4 kg (183 lb 13.8 oz)    Lab Results: Basic Metabolic Panel:  Recent Labs  06/10/16 0502 06/11/16 0347  NA 144 145  K 3.5 3.6  CL 115* 114*  CO2 25 25  GLUCOSE 136* 133*  BUN 56* 40*  CREATININE 3.43* 2.55*   CBC:  Recent Labs  06/10/16 0502 06/11/16 0347  WBC 7.0 6.7  HGB 8.7* 7.8*  HCT 28.3* 26.1*  MCV 73.1* 75.9*  PLT 125* 111*   Cardiac Enzymes:   Recent Labs  06/08/16 1453  TROPONINI 0.12*    Telemetry: Sinus bradycardia but with periods of junctional rhythm as well as AIVR, PVCs with some nonsustained runs of VT.  Assessment/Plan:  1.  Cardiomyopathy potentially ischemic 2.  Multiple intermittent arrhythmias including junctional, AIVR and nonsustained VT 3.  Acute renal failure rapidly correcting 4.  Shock likely septic and may have cardiogenic component  Recommendations:  I continue amiodarone for now.  No other suggestions at the present time.       Kerry Hough  MD [facility]  06/11/2016, 12:19 PM

## 2016-06-11 NOTE — Significant Event (Signed)
11:20 PM  Patient continues to have potpourri if different arrythmias, but in the past hour his HR was consistently in the 40s. Electrolytes checked with no major abnormalities. Otherwise BP is stable (on Levo and Vaso) and he is interactive. He has received Amiodarone drip since 10/25, but is unable to switch to stable oral dose because he cannot tolerate PO due to intra-abd process. We turned down Amio to 15mg /hr and HR is now in 60s (unclear if this had causative effect). No further changes at this time.  Soyla Murphy, MD

## 2016-06-11 NOTE — Progress Notes (Signed)
Pharmacy Antibiotic Note  [name] is a 70 y.o. male admitted on 06/07/2016 with risk of sepsis from possible intra-abdominal abscess. Pharmacy has been consulted for Zosyn dosing. Patient is afebrile, WBC remained wnl, lactic acid wnl, and PCT 0.33. He has an AKI that is resolving, SCR 11.3 >> 2.55 with CrCl 27 mL/min.   Plan:  - Change zosyn to 3.375gm IV Q8H (4 hr inf) - Monitor clinical status, renal function    Height: 5\' 9"  (175.3 cm) Weight: 183 lb 13.8 oz (83.4 kg) IBW/kg (Calculated) : 70.7  Temp (24hrs), Avg:98.7 F (37.1 C), Min:97.8 F (36.6 C), Max:99.2 F (37.3 C)   Recent Labs Lab 06/07/16 1335 06/07/16 1347  06/07/16 1717  06/08/16 0754 06/08/16 1453 06/09/16 0223 06/10/16 0502 06/11/16 0347  WBC 12.6*  --   --   --   --  8.1  --  6.9 7.0 6.7  CREATININE  --   --   < >  --   < > 7.88* 7.18* 5.43* 3.43* 2.55*  LATICACIDVEN  --  1.92*  --  1.77  --   --   --   --   --   --   < > = values in this interval not displayed.  Estimated Creatinine Clearance: 26.6 mL/min (by C-G formula based on SCr of 2.55 mg/dL (H)).   SCr elevated from unknown baseline  No Known Allergies  Antimicrobials this admission:  Zosyn 10/24 >>  Dose adjustments this admission:  N/A  Microbiology results:  10/24 BCx: NGTD 10/24 UCx: NEG 10/24 MRSA PCR: negative  Thank you for allowing pharmacy to be a part of this patient's care.  Jordell Outten, Rande Lawman, PharmD  06/11/2016 2:44 PM

## 2016-06-11 NOTE — Progress Notes (Signed)
[place] Progress Note Patient Name: [name] DOB: 03-07-1946 MRN: [id number]   Date of Service  06/11/2016  HPI/Events of Note  hypomag and hypophos  eICU Interventions  Mag and phos replaced     Intervention Category Intermediate Interventions: Electrolyte abnormality - evaluation and management  Elvia Aydin 06/11/2016, 4:26 AM

## 2016-06-11 NOTE — Progress Notes (Signed)
General Surgery [facility]  06/11/2016  Assessment & Plan: Sepsis syndrome, multi-organ failure  Abdominal exam benign this afternoon - patient overall improving  See Dr. Hulen Skains note from 10/27  No acute surgical issues identified - will sign off - call if needed        Stephen Regal, MD, [facility]       Office: [phone]    Subjective: Patient awake and responsive on vent.  Denies abdominal pain.  Objective: Vital signs in last 24 hours: Temp:  [97.8 F (36.6 C)-99.2 F (37.3 C)] 99.2 F (37.3 C) (10/28 1226) Pulse Rate:  [25-127] 52 (10/28 1223) Resp:  [11-21] 18 (10/28 1223) BP: (76-143)/(42-78) 116/55 (10/28 1223) SpO2:  [93 %-100 %] 100 % (10/28 1223) FiO2 (%):  [30 %-50 %] 40 % (10/28 1223) Weight:  [82.9 kg (182 lb 12.2 oz)-83.4 kg (183 lb 13.8 oz)] 83.4 kg (183 lb 13.8 oz) (10/28 0338) Last BM Date: 06/11/16  Intake/Output from previous day: 10/27 0701 - 10/28 0700 In: 3460.4 [I.V.:1420.4; NG/GT:1380; IV Piggyback:660] Out: [id number] [Urine:565; Emesis/NG output:655; Stool:575] Intake/Output this shift: Total I/O In: 292.1 [I.V.:242.1; IV Piggyback:50] Out: 220 [Urine:220]  Physical Exam: Abdomen - soft without distension; non-tender; ostomy right mid abdomen with succus in bag  Lab Results:   Recent Labs  06/10/16 0502 06/11/16 0347  WBC 7.0 6.7  HGB 8.7* 7.8*  HCT 28.3* 26.1*  PLT 125* 111*   BMET  Recent Labs  06/10/16 0502 06/11/16 0347  NA 144 145  K 3.5 3.6  CL 115* 114*  CO2 25 25  GLUCOSE 136* 133*  BUN 56* 40*  CREATININE 3.43* 2.55*  CALCIUM 7.7* 7.8*   PT/INR No results for input(s): LABPROT, INR in the last 72 hours. Comprehensive Metabolic Panel:    Component Value Date/Time   NA 145 06/11/2016 0347   NA 144 06/10/2016 0502   K 3.6 06/11/2016 0347   K 3.5 06/10/2016 0502   CL 114 (H) 06/11/2016 0347   CL 115 (H) 06/10/2016 0502   CO2 25 06/11/2016 0347   CO2 25  06/10/2016 0502   BUN 40 (H) 06/11/2016 0347   BUN 56 (H) 06/10/2016 0502   CREATININE 2.55 (H) 06/11/2016 0347   CREATININE 3.43 (H) 06/10/2016 0502   GLUCOSE 133 (H) 06/11/2016 0347   GLUCOSE 136 (H) 06/10/2016 0502   CALCIUM 7.8 (L) 06/11/2016 0347   CALCIUM 7.7 (L) 06/10/2016 0502   AST 15 06/09/2016 0223   AST 17 06/07/2016 1423   ALT 17 06/09/2016 0223   ALT 31 06/07/2016 1423   ALKPHOS 67 06/09/2016 0223   ALKPHOS 103 06/07/2016 1423   BILITOT 0.7 06/09/2016 0223   BILITOT 0.7 06/07/2016 1423   PROT 6.3 (L) 06/09/2016 0223   PROT 8.9 (H) 06/07/2016 1423   ALBUMIN 2.3 (L) 06/09/2016 0223   ALBUMIN 3.5 06/07/2016 1423    Studies/Results: Dg Chest Port 1 View  Result Date: 06/11/2016 CLINICAL DATA:  Endotracheal tube placement EXAM: PORTABLE CHEST 1 VIEW COMPARISON:  06/10/2016 FINDINGS: Endotracheal tube with the tip 2.7 cm above the carina. Nasogastric tube coursing below the diaphragm projecting over the stomach. Left IJ central venous catheter with the tip located at the confluence of the right IJ - brachycephalic pain and directed cranially ; recommend repositioning prior to use. Mild bilateral interstitial prominence. No focal consolidation, pleural effusion or pneumothorax. Stable cardiomegaly. Moderate osteoarthritis of bilateral glenohumeral joints.  IMPRESSION: 1. Endotracheal tube with the tip 2.7 cm above the carina. 2. Left IJ central venous catheter with the tip located at the confluence of the right IJ - brachycephalic pain and directed cranially ; recommend repositioning prior to use. Electronically Signed   By: Kathreen Devoid   On: 06/11/2016 08:59   Dg Chest Port 1 View  Result Date: 06/10/2016 CLINICAL DATA:  Endotracheal tube EXAM: PORTABLE CHEST 1 VIEW COMPARISON:  Yesterday FINDINGS: Endotracheal tube tip between the clavicular heads and carina. Left IJ central line crosses into the right brachiocephalic vein, unchanged. Nasogastric tube reaches the stomach.  Worsening left basilar aeration with obscured diaphragm. No pneumothorax or edema. IMPRESSION: 1. Unchanged positioning of tubes and left IJ line with tip at the right brachiocephalic vein. 2. Interval worsening retrocardiac aeration, likely atelectasis. Electronically Signed   By: Monte Fantasia M.D.   On: 06/10/2016 08:44      [name] 06/11/2016  Patient ID: [name], male   DOB: [birth year], 70 y.o.   MRN: [id number]

## 2016-06-11 NOTE — Procedures (Signed)
Pt awake and alert but failed PS trial this am due to decreased pt effort, VE 2.0 & periods of apnea.  Pt back on full support at this time, will try wean later today as tolerated, RT will monitor

## 2016-06-12 ENCOUNTER — Inpatient Hospital Stay (HOSPITAL_COMMUNITY): Payer: Medicare Other

## 2016-06-12 LAB — GLUCOSE, CAPILLARY
GLUCOSE-CAPILLARY: 154 mg/dL — AB (ref 65–99)
GLUCOSE-CAPILLARY: 132 mg/dL — AB (ref 65–99)
GLUCOSE-CAPILLARY: 107 mg/dL — AB (ref 65–99)
GLUCOSE-CAPILLARY: 106 mg/dL — AB (ref 65–99)
Glucose-Capillary: 105 mg/dL — ABNORMAL HIGH (ref 65–99)
Glucose-Capillary: 130 mg/dL — ABNORMAL HIGH (ref 65–99)

## 2016-06-12 LAB — BASIC METABOLIC PANEL
ANION GAP: 4 — AB (ref 5–15)
BUN: 32 mg/dL — AB (ref 6–20)
CHLORIDE: 115 mmol/L — AB (ref 101–111)
CO2: 23 mmol/L (ref 22–32)
Calcium: 7.9 mg/dL — ABNORMAL LOW (ref 8.9–10.3)
Creatinine, Ser: 1.93 mg/dL — ABNORMAL HIGH (ref 0.61–1.24)
GFR calc Af Amer: 39 mL/min — ABNORMAL LOW (ref >60)
GFR calc non Af Amer: 33 mL/min — ABNORMAL LOW (ref >60)
GLUCOSE: 158 mg/dL — AB (ref 65–99)
POTASSIUM: 3.9 mmol/L (ref 3.5–5.1)
Sodium: 142 mmol/L (ref 135–145)

## 2016-06-12 LAB — BLOOD GAS, ARTERIAL
ACID-BASE DEFICIT: 1.5 mmol/L (ref 0.0–2.0)
Bicarbonate: 22.1 mmol/L (ref 20.0–28.0)
DRAWN BY: 398661
FIO2: 40
LHR: 18 {breaths}/min
MECHVT: 570 mL
O2 SAT: 98.7 %
PCO2 ART: 33.9 mmHg (ref 32.0–48.0)
PH ART: 7.431 (ref 7.350–7.450)
Patient temperature: 98.6
pO2, Arterial: 126 mmHg — ABNORMAL HIGH (ref 83.0–108.0)

## 2016-06-12 LAB — CBC
HEMATOCRIT: 24.8 % — AB (ref 39.0–52.0)
HEMOGLOBIN: 7.3 g/dL — AB (ref 13.0–17.0)
MCH: 22.1 pg — AB (ref 26.0–34.0)
MCHC: 29.4 g/dL — ABNORMAL LOW (ref 30.0–36.0)
MCV: 75.2 fL — AB (ref 78.0–100.0)
Platelets: 95 10*3/uL — ABNORMAL LOW (ref 150–400)
RBC: 3.3 MIL/uL — AB (ref 4.22–5.81)
RDW: 21.7 % — ABNORMAL HIGH (ref 11.5–15.5)
WBC: 7.1 10*3/uL (ref 4.0–10.5)

## 2016-06-12 LAB — CULTURE, BLOOD (ROUTINE X 2)
CULTURE: NO GROWTH
Culture: NO GROWTH

## 2016-06-12 LAB — PHOSPHORUS: Phosphorus: 1.4 mg/dL — ABNORMAL LOW (ref 2.5–4.6)

## 2016-06-12 LAB — MAGNESIUM: MAGNESIUM: 1.9 mg/dL (ref 1.7–2.4)

## 2016-06-12 MED ORDER — ATROPINE SULFATE 1 MG/10ML IJ SOSY
PREFILLED_SYRINGE | INTRAMUSCULAR | Status: AC
  Filled 2016-06-12: qty 10

## 2016-06-12 MED ORDER — DEXTROSE 5 % IV SOLN: 30.0000 mmol | Freq: Once | INTRAVENOUS | Status: DC

## 2016-06-12 MED ORDER — SODIUM PHOSPHATES 45 MMOLE/15ML IV SOLN
30.0000 mmol | Freq: Once | INTRAVENOUS | Status: AC
  Administered 2016-06-12: 30 mmol via INTRAVENOUS
  Filled 2016-06-12: qty 10

## 2016-06-12 MED ORDER — ORAL CARE MOUTH RINSE: 15.0000 mL | Freq: Two times a day (BID) | OROMUCOSAL | Status: DC

## 2016-06-12 MED ORDER — SODIUM CHLORIDE 0.9% FLUSH
10.0000 mL | INTRAVENOUS | Status: DC | PRN
  Administered 2016-06-12: 20 mL

## 2016-06-12 MED ORDER — MAGNESIUM SULFATE 2 GM/50ML IV SOLN
2.0000 g | Freq: Once | INTRAVENOUS | Status: AC
  Administered 2016-06-12: 2 g via INTRAVENOUS
  Filled 2016-06-12: qty 50

## 2016-06-12 NOTE — Procedures (Signed)
Extubation Procedure Note  Patient Details:   Name: [name] DOB: November 08, 1945 MRN: [id number]   Airway Documentation:     Evaluation  O2 sats: stable throughout Complications: No apparent complications Patient did tolerate procedure well. Bilateral Breath Sounds: Clear, Diminished   Pt extubated by Merry Proud RT at this time, placed on 4L [place] tolerating well.  Ciro Backer 06/12/2016, 11:38 AM

## 2016-06-12 NOTE — Progress Notes (Signed)
PULMONARY / CRITICAL CARE MEDICINE   Name: [name] MRN: [id number] DOB: 11-20-1945    ADMISSION DATE:  06/07/2016 CONSULTATION DATE: 06/07/16  REFERRING MD:  Dr. Winfred Leeds EDP  CHIEF COMPLAINT:  Hyperkalemia  HISTORY OF PRESENT ILLNESS:   70 year old male with PMH as below which is significant for CHF, CKD III, COPD, diverticulitis with perforation and now ostomy since 2009, and arthritis. Per family he has been feeling unwell for probably about a week, but notably on 10/22 he started getting more lethargic. No specific complaints, just "didn't feel well". 10/24 when daughter got to his house he was fairly lethargic so she called EMS and he was brought to the ED where laboratory evaluation significant for K >7.5, CO2 8, Creatinine 11.31. He was temporized and K fell to 6.9. He was seen by nephrology who recommended bicarb and kayexalate and potentially HD if not improving. New fungating scrotal/testicular mass identified in ED. CT abdomen and pelvis demonstrated possible intraabdominal abscess. PCCM asked to admit.   SUBJECTIVE:  No events overnight, on levo of 3.  VITAL SIGNS: BP (!) 103/56   Pulse (!) 48   Temp 97.6 F (36.4 C) (Oral)   Resp (!) 21   Ht 5\' 9"  (1.753 m)   Wt 83.7 kg (184 lb 8.4 oz)   SpO2 100%   BMI 27.25 kg/m   HEMODYNAMICS: CVP:  [9 mmHg] 9 mmHg  VENTILATOR SETTINGS: Vent Mode: CPAP;PSV FiO2 (%):  [40 %] 40 % Set Rate:  [18 bmp] 18 bmp Vt Set:  [570 mL] 570 mL PEEP:  [5 cmH20] 5 cmH20 Pressure Support:  [5 cmH20-10 cmH20] 5 cmH20 Plateau Pressure:  [9 cmH20-16 cmH20] 9 cmH20  INTAKE / OUTPUT: I/O last 3 completed shifts: In: 3739.1 [I.V.:2069.1; NG/GT:810; IV Piggyback:860] Out: 2200 [Urine:1625; Stool:575]  PHYSICAL EXAMINATION: GEN: NAD, awake and alert  HEENT:EOMI, sclera clear  CV: RRR, no murmurs, rubs, or gallops PULM: CTAB, normal effort ABD: Soft, tender to palpation diffusely with some guarding but no rebound. Ostomy site  pink and with dark green output, nondistended, NABS SKIN: No rash or cyanosis; warm and well-perfused EXTR: No lower extremity edema or calf tenderness.  R UE edema NEURO: Awake, alert  LABS:  BMET  Recent Labs Lab 06/11/16 0347 06/11/16 2239 06/12/16 0612  NA 145 142 142  K 3.6 3.9 3.9  CL 114* 113* 115*  CO2 25 23 23   BUN 40* 34* 32*  CREATININE 2.55* 2.01* 1.93*  GLUCOSE 133* 124* 158*   Electrolytes  Recent Labs Lab 06/10/16 0734 06/11/16 0347 06/11/16 2239 06/12/16 0612  CALCIUM  --  7.8* 7.7* 7.9*  MG 1.6* 1.6* 2.1 1.9  PHOS 2.8 1.6*  --  1.4*   CBC  Recent Labs Lab 06/10/16 0502 06/11/16 0347 06/12/16 0612  WBC 7.0 6.7 7.1  HGB 8.7* 7.8* 7.3*  HCT 28.3* 26.1* 24.8*  PLT 125* 111* 95*   Coag's No results for input(s): APTT, INR in the last 168 hours.  Sepsis Markers  Recent Labs Lab 06/07/16 1347 06/07/16 1717 06/07/16 2113  LATICACIDVEN 1.92* 1.77  --   PROCALCITON  --   --  0.33   ABG  Recent Labs Lab 06/10/16 0812 06/11/16 0454 06/12/16 0505  PHART 7.401 7.413 7.431  PCO2ART 36.7 36.3 33.9  PO2ART 197* 74.4* 126*   Liver Enzymes  Recent Labs Lab 06/07/16 1423 06/09/16 0223  AST 17 15  ALT 31 17  ALKPHOS 103 67  BILITOT 0.7 0.7  ALBUMIN 3.5 2.3*   Cardiac Enzymes  Recent Labs Lab 06/08/16 0319 06/08/16 0754 06/08/16 1453  TROPONINI 0.09* 0.11* 0.12*   Glucose  Recent Labs Lab 06/11/16 1227 06/11/16 1637 06/11/16 2025 06/12/16 0019 06/12/16 0417 06/12/16 0840  GLUCAP 165* 120* 153* 130* 105* 154*   STUDIES:  CT Abd/Pelvis 10/24 > Changes of prior bowel surgery with right lower quadrant ostomy. There is a gas and fluid collection in the pelvis measuring 7.6 cm with surrounding suture. This is intimately associated with the sigmoid colon remnant and may represent a segment of these are patent dilated bowel, but I cannot completely exclude that this is an abscess adjacent to the Hartmann's pouch. Mixture of  low-density and high-density lesions within the kidneys bilaterally, enlarging since prior study. The largest cyst in the midpole measuring 5.6 cm compared to 3.3 cm previously. While these may reflect a combination of simple and hemorrhagic/complex cysts, I cannot exclude solid renal lesions/ neoplasms. Recommend further evaluation with contrast-enhanced CT or MRI. Left adrenal nodule, slightly larger than prior study, nonspecific. Aortoiliac atherosclerosis. Large bilateral hydroceles, left greater than right. CXR 10/24: no acute abnormality  CULTURES: Blood Cx 10/24 > Urine 10/24 >  ANTIBIOTICS: Zosyn 10/24 >  SIGNIFICANT EVENTS: 10/24: admit for hyperkalemia, lethargy 10/25: ectopy started on amiodarone bolus and drip. Started on lidocaine bolus and drip. Seen by cardiology  LINES/TUBES: ETT 10/25>>> L IJ TLC 10/25>>>  DISCUSSION: 70 year old male presenting with 4 days lethargy thought to be due to uremic encephalopathy which is now resolved. Found to be in acute on chronic renal failure and profoundly hyperkalemia which has now resolved with Kayexelate and bicarb drip. Also found to have a possible intra-abdominal abscess and was started on Zosyn on admit. Overnight he was noted to have PVC and non-sustained Vtach not controlled with IV amiodarone (bolus and drip) and Lidocaine drip. Also developed hypotension/shock requiring Neo-synephrine likely septic shock. Ectopy with nonsustained Vtach likely due to sepsis.    ASSESSMENT / PLAN:  CARDIOVASCULAR A:  Hypotension Shock- requiring Neo. Differentials include septic vs cardiogenic (possibly caused by ectopy) Ectopy with nonsustained VT: multiple PVCs and nonsustained vtach which started after electrolytes normalized. Troponin 0.09 H/o CHF H/o HTN P:  Telemetry monitoring. Levophed MAP > 65, down to 3. Continue amiodarone drip at 1/2 dose. Cardiology consult appreciated.  RENAL A:   Acute kidney injury on CKD III  >acute episode likely due to ACE-I, dehydration, lasix: creatinine improving. (unknown baseline)  Hyperkalemia, severe: resolved  NAG acidosis: improving  Lactic Acidosis: resolved  P:   Nephrology following Foley for urine output monitoring  Replace electrolytes as indicated BMET in AM.  GASTROINTESTINAL/GENITOURINARY A:   H/o  Bowel perf now with ostomy Testicular/scrotal mass Bilateral hydrocele  P:   TF per nutrition Pepcid for SUP Consult urology and CCS appreciated, no active interventions at this time, consider scrotal U/S to evaluate hydrocele.  PULMONARY A: At risk for poor airway protection due to AMS: Ams has now resolved  COPD without acute exacerbation  P:   PRN albuterol. Extubate. IS per RT protocol Ambulate PT SLP  HEMATOLOGIC A:   Leukocytosis -  Possible intra-abdominal abscess?  P:  CBC pending Heparin subcutaneous for VTE ppx If DVT study is positive then will need heparin.  INFECTIOUS A:   Possible intraabdominal abscess: pro-calcitonin 0.33 P:   Zosyn. Surgery and urology consults appreciated, no drain via IR given location, see discussion on IR's note. Follow blood cx and urine cx.  ENDOCRINE A:   No acute issues P:   Monitor with BMP  NEUROLOGIC A:   Acute metabolic encephalopathy in setting of uremia: resolved P:   RASS goal: 0 to -1 Monitor D/C sedation post extubation  FAMILY  - Updates: Patient updated bedside.  - Inter-disciplinary family meet or Palliative Care meeting due by:  10/31.  The patient is critically ill with multiple organ systems failure and requires high complexity decision making for assessment and support, frequent evaluation and titration of therapies, application of advanced monitoring technologies and extensive interpretation of multiple databases.   Critical Care Time devoted to patient care services described in this note is  37  Minutes. This time reflects time of care of this signee  Dr Jennet Maduro. This critical care time does not reflect procedure time, or teaching time or supervisory time of PA/NP/Med student/Med Resident etc but could involve care discussion time.  Rush Farmer, M.D. [facility] Pulmonary/Critical Care Medicine. Pager: [phone]. After hours pager: [phone].

## 2016-06-12 NOTE — Progress Notes (Signed)
Per MD request, sedation reduced and pt placed on CPAP 5 PS 5 wean.

## 2016-06-12 NOTE — Progress Notes (Signed)
Subjective:  Currently awake sitting up in bed extubated without any problems.  Able to answer questions.  He is currently not short of breath.  States that he just felt bad and quit eating prior to coming in.  States the fungating mass is been present since this summer but nobody looked at it.  No prior cardiac history that he is aware of he does state that he was told in the 2000's may be that cigarettes were affecting him.  Had some arrhythmias overnight with bradycardia.  Objective:  Vital Signs in the last 24 hours: BP 106/66   Pulse (!) 47   Temp 97.6 F (36.4 C) (Oral)   Resp 17   Ht 5\' 9"  (1.753 m)   Wt 83.7 kg (184 lb 8.4 oz)   SpO2 100%   BMI 27.25 kg/m   Physical Exam: Elderly black male currently in no acute distress Lungs:  Clear Cardiac: Slow Regular rhythm with occasional early beats, normal S1 and S2, no S3 Extremities:  Trace edema present, venous insufficiency changes noted.  Intake/Output from previous day: 10/28 0701 - 10/29 0700 In: 1699.6 [I.V.:1359.6; NG/GT:90; IV Piggyback:250] Out: 1375 [Urine:1125; Stool:250]  Weight Filed Weights   06/11/16 0030 06/11/16 0338 06/12/16 0406  Weight: 82.9 kg (182 lb 12.2 oz) 83.4 kg (183 lb 13.8 oz) 83.7 kg (184 lb 8.4 oz)    Lab Results: Basic Metabolic Panel:  Recent Labs  06/11/16 2239 06/12/16 0612  NA 142 142  K 3.9 3.9  CL 113* 115*  CO2 23 23  GLUCOSE 124* 158*  BUN 34* 32*  CREATININE 2.01* 1.93*   CBC:  Recent Labs  06/11/16 0347 06/12/16 0612  WBC 6.7 7.1  HGB 7.8* 7.3*  HCT 26.1* 24.8*  MCV 75.9* 75.2*  PLT 111* 95*   Cardiac Enzymes:  Telemetry: Sinus bradycardia but with periods of junctional rhythm as well as AIVR, PVCs with some nonsustained runs of VT.  Assessment/Plan:  1.  Cardiomyopathy potentially ischemic 2.  Multiple intermittent arrhythmias including junctional, AIVR and nonsustained VT-he is currently having less AIV are this time but still has some mild nonsustained  VT and PVCs.  Still on pressors. 3.  Acute renal failure rapidly correcting 4.  Shock likely septic and may have cardiogenic component  Recommendations:  I would just stop the amiodarone for now.  Reasonable to accept some brief nonsustained VT.  Consider transfusion to avoid ischemic requirements.        Kerry Hough  MD [facility] Cardiology  06/12/2016, 11:07 AM

## 2016-06-12 NOTE — Progress Notes (Signed)
[place] Progress Note Patient Name: [name] DOB: 06/22/1946 MRN: [id number]   Date of Service  06/12/2016  HPI/Events of Note  Hypophosphatemia  eICU Interventions  Phos replaced     Intervention Category Intermediate Interventions: Electrolyte abnormality - evaluation and management  Makalia Bare 06/12/2016, 6:52 AM

## 2016-06-13 ENCOUNTER — Encounter (HOSPITAL_COMMUNITY): Payer: Self-pay | Admitting: *Deleted

## 2016-06-13 ENCOUNTER — Inpatient Hospital Stay (HOSPITAL_COMMUNITY): Payer: Medicare Other

## 2016-06-13 DIAGNOSIS — R1013 Epigastric pain: Secondary | ICD-10-CM

## 2016-06-13 DIAGNOSIS — R609 Edema, unspecified: Secondary | ICD-10-CM

## 2016-06-13 LAB — BASIC METABOLIC PANEL
Anion gap: 4 — ABNORMAL LOW (ref 5–15)
BUN: 26 mg/dL — AB (ref 6–20)
CHLORIDE: 115 mmol/L — AB (ref 101–111)
CO2: 26 mmol/L (ref 22–32)
CREATININE: 1.75 mg/dL — AB (ref 0.61–1.24)
Calcium: 8.1 mg/dL — ABNORMAL LOW (ref 8.9–10.3)
GFR calc Af Amer: 43 mL/min — ABNORMAL LOW (ref >60)
GFR calc non Af Amer: 37 mL/min — ABNORMAL LOW (ref >60)
GLUCOSE: 93 mg/dL (ref 65–99)
POTASSIUM: 3.9 mmol/L (ref 3.5–5.1)
SODIUM: 145 mmol/L (ref 135–145)

## 2016-06-13 LAB — CBC
HCT: 24.2 % — ABNORMAL LOW (ref 39.0–52.0)
HEMOGLOBIN: 7 g/dL — AB (ref 13.0–17.0)
MCH: 22.3 pg — AB (ref 26.0–34.0)
MCHC: 28.9 g/dL — AB (ref 30.0–36.0)
MCV: 77.1 fL — AB (ref 78.0–100.0)
Platelets: 83 10*3/uL — ABNORMAL LOW (ref 150–400)
RBC: 3.14 MIL/uL — AB (ref 4.22–5.81)
RDW: 21.7 % — ABNORMAL HIGH (ref 11.5–15.5)
WBC: 5.2 10*3/uL (ref 4.0–10.5)

## 2016-06-13 LAB — PHOSPHORUS: Phosphorus: 2.5 mg/dL (ref 2.5–4.6)

## 2016-06-13 LAB — GLUCOSE, CAPILLARY
GLUCOSE-CAPILLARY: 104 mg/dL — AB (ref 65–99)
GLUCOSE-CAPILLARY: 108 mg/dL — AB (ref 65–99)
GLUCOSE-CAPILLARY: 188 mg/dL — AB (ref 65–99)
GLUCOSE-CAPILLARY: 96 mg/dL (ref 65–99)
Glucose-Capillary: 123 mg/dL — ABNORMAL HIGH (ref 65–99)
Glucose-Capillary: 92 mg/dL (ref 65–99)

## 2016-06-13 LAB — MAGNESIUM: MAGNESIUM: 1.9 mg/dL (ref 1.7–2.4)

## 2016-06-13 MED ORDER — SODIUM CHLORIDE 0.9% FLUSH: 3.0000 mL | INTRAVENOUS | Status: DC | PRN

## 2016-06-13 MED ORDER — INSULIN ASPART 100 UNIT/ML ~~LOC~~ SOLN
2.0000 [IU] | Freq: Three times a day (TID) | SUBCUTANEOUS | Status: DC
  Administered 2016-06-14 (×3): 2 [IU] via SUBCUTANEOUS

## 2016-06-13 MED ORDER — SODIUM CHLORIDE 0.9 % IV SOLN: 250.0000 mL | INTRAVENOUS | Status: DC | PRN

## 2016-06-13 MED ORDER — SODIUM CHLORIDE 0.9% FLUSH
3.0000 mL | Freq: Two times a day (BID) | INTRAVENOUS | Status: DC
  Administered 2016-06-13 – 2016-06-15 (×4): 3 mL via INTRAVENOUS

## 2016-06-13 NOTE — Progress Notes (Addendum)
PULMONARY / CRITICAL CARE MEDICINE   Name: [name] MRN: [id number] DOB: 06/17/1946    ADMISSION DATE:  06/07/2016 CONSULTATION DATE: 06/07/16  REFERRING MD:  Dr. Winfred Leeds EDP  CHIEF COMPLAINT:  Hyperkalemia  HISTORY OF PRESENT ILLNESS:   70 year old male with PMH as below which is significant for CHF, CKD III, COPD, diverticulitis with perforation and now ostomy since 2009, and arthritis. Per family he has been feeling unwell for probably about a week, but notably on 10/22 he started getting more lethargic. No specific complaints, just "didn't feel well". 10/24 when daughter got to his house he was fairly lethargic so she called EMS and he was brought to the ED where laboratory evaluation significant for K >7.5, CO2 8, Creatinine 11.31. He was temporized and K fell to 6.9. He was seen by nephrology who recommended bicarb and kayexalate and potentially HD if not improving. New fungating scrotal/testicular mass identified in ED. CT abdomen and pelvis demonstrated possible intraabdominal abscess. PCCM asked to admit.   SUBJECTIVE:  No events overnight. Was extubated on 10/29 AM. Levophed was stopped at 2040 on 10/30. Vasopressin stopped 1500 10/29. MAPs have been in the upper 60s mainly with intermittent low 60s.   VITAL SIGNS: BP (!) 99/58   Pulse (!) 44   Temp 98.7 F (37.1 C) (Oral)   Resp (!) 25   Ht 5\' 9"  (1.753 m)   Wt 83.7 kg (184 lb 8.4 oz)   SpO2 99%   BMI 27.25 kg/m   HEMODYNAMICS:    VENTILATOR SETTINGS: Vent Mode: CPAP;PSV FiO2 (%):  [4 %-40 %] 4 % PEEP:  [5 cmH20] 5 cmH20 Pressure Support:  [5 cmH20-10 cmH20] 5 cmH20 Plateau Pressure:  [9 cmH20] 9 cmH20  INTAKE / OUTPUT: I/O last 3 completed shifts: In: 1719.3 [I.V.:1069.3; NG/GT:90; IV Piggyback:560] Out: 2360 [Urine:1710; Stool:650]  PHYSICAL EXAMINATION: GEN: NAD, awake and alert  HEENT:EOMI, sclera clear  CV: RRR, no murmurs, rubs, or gallops PULM: CTAB, normal effort ABD: Soft, NT, no  r/g SKIN: No rash or cyanosis; warm and well-perfused EXTR:mild erythema and swollen ant rue above elbow, seems focal,  NEURO: Awake, alert  LABS:  BMET  Recent Labs Lab 06/11/16 2239 06/12/16 0612 06/13/16 0529  NA 142 142 145  K 3.9 3.9 3.9  CL 113* 115* 115*  CO2 23 23 26   BUN 34* 32* 26*  CREATININE 2.01* 1.93* 1.75*  GLUCOSE 124* 158* 93   Electrolytes  Recent Labs Lab 06/11/16 0347 06/11/16 2239 06/12/16 0612 06/13/16 0529  CALCIUM 7.8* 7.7* 7.9* 8.1*  MG 1.6* 2.1 1.9 1.9  PHOS 1.6*  --  1.4* 2.5   CBC  Recent Labs Lab 06/11/16 0347 06/12/16 0612 06/13/16 0529  WBC 6.7 7.1 5.2  HGB 7.8* 7.3* 7.0*  HCT 26.1* 24.8* 24.2*  PLT 111* 95* 83*   Coag's No results for input(s): APTT, INR in the last 168 hours.  Sepsis Markers  Recent Labs Lab 06/07/16 1347 06/07/16 1717 06/07/16 2113  LATICACIDVEN 1.92* 1.77  --   PROCALCITON  --   --  0.33   ABG  Recent Labs Lab 06/10/16 0812 06/11/16 0454 06/12/16 0505  PHART 7.401 7.413 7.431  PCO2ART 36.7 36.3 33.9  PO2ART 197* 74.4* 126*   Liver Enzymes  Recent Labs Lab 06/07/16 1423 06/09/16 0223  AST 17 15  ALT 31 17  ALKPHOS 103 67  BILITOT 0.7 0.7  ALBUMIN 3.5 2.3*   Cardiac Enzymes  Recent Labs Lab 06/08/16 0319  06/08/16 0754 06/08/16 1453  TROPONINI 0.09* 0.11* 0.12*   Glucose  Recent Labs Lab 06/12/16 0417 06/12/16 0840 06/12/16 1342 06/12/16 1725 06/12/16 1929 06/13/16 0014  GLUCAP 105* 154* 132* 107* 106* 96   STUDIES:  CT Abd/Pelvis 10/24 > Changes of prior bowel surgery with right lower quadrant ostomy. There is a gas and fluid collection in the pelvis measuring 7.6 cm with surrounding suture. This is intimately associated with the sigmoid colon remnant and may represent a segment of these are patent dilated bowel, but I cannot completely exclude that this is an abscess adjacent to the Hartmann's pouch. Mixture of low-density and high-density lesions within the  kidneys bilaterally, enlarging since prior study. The largest cyst in the midpole measuring 5.6 cm compared to 3.3 cm previously. While these may reflect a combination of simple and hemorrhagic/complex cysts, I cannot exclude solid renal lesions/ neoplasms. Recommend further evaluation with contrast-enhanced CT or MRI. Left adrenal nodule, slightly larger than prior study, nonspecific. Aortoiliac atherosclerosis. Large bilateral hydroceles, left greater than right. CXR 10/24: no acute abnormality CXR 10/30: Left central line crosses midline with tip directed superiorly. Tip may be within the right internal jugular vein/innominate vein. Pulmonary vascular congestion most notable centrally minimally changed from prior exam. Persistent consolidation left base.  CULTURES: Blood Cx 10/24 > no growth final  Urine 10/24 > no growth final   ANTIBIOTICS: Zosyn 10/24 >  SIGNIFICANT EVENTS: 10/24: admit for hyperkalemia, lethargy 10/25: ectopy started on amiodarone bolus and drip. Started on lidocaine bolus and drip. Seen by cardiology  LINES/TUBES: ETT 10/25>>>10/29 L IJ TLC 10/25>>>  DISCUSSION: 69 year old male presenting with 4 days lethargy thought to be due to uremic encephalopathy which is now resolved. Found to be in acute on chronic renal failure and profoundly hyperkalemia which has now resolved with Kayexelate and bicarb drip. Also found to have a possible intra-abdominal abscess and was started on Zosyn on admit. Developed hypotension/shock requiring Neo-synephrine likely septic shock. Ectopy with nonsustained Vtach possibly due to sepsis initially required amiodarone, but now off per cards (10/29).   ASSESSMENT / PLAN:  CARDIOVASCULAR A:  Hypotension: MAP mainly above 65 Shock- resolved. Septic with possible cardiogenic component Ectopy with nonsustained VT: multiple PVCs and nonsustained vtach which started after electrolytes normalized.  H/o CHF H/o HTN Possible Ischemic  Cardiomyopathy P:  Telemetry monitoring. Cardiology consult appreciated Keep on tele Dc line if able  RENAL A:   Acute kidney injury on CKD III >acute episode likely due to ACE-I, dehydration, lasix: creatinine improving. (unknown baseline)  Hyperkalemia, severe: resolved  NAG acidosis: resolved Lactic Acidosis: resolved  P:   Nephrology following Foley for urine output monitoring  Replace electrolytes as indicated  GASTROINTESTINAL/GENITOURINARY A:   H/o  Bowel perf now with ostomy Testicular/scrotal mass Bilateral hydrocele  P:   TF per nutrition- off Pepcid for SUP Consulted urology and CCS appreciated, no active interventions at this time, consider scrotal U/S to evaluate hydrocele. Clears, slp consider dc  PULMONARY A: At risk for poor airway protection due to [id number] 10/29 COPD without acute exacerbation  P:   PRN albuterol. IS per RT protocol Ambulate PT  HEMATOLOGIC A:   Leukocytosis -  Possible intra-abdominal abscess? unlikely Thrombocytopenia - 4T HIT score 5 (intermediate probability) Anemia- slow downtrend. No active signs of bleeding P:  CBC PM to trend hgb  Heparin subcutaneous for VTE ppx - dc Doppler arm, low threshold argartroban Consider HIT panel and stopping Sub Q Heparin   INFECTIOUS  A:   Possible intraabdominal abscess: pro-calcitonin 0.33 P:   Zosyn, add stop date 10/31, then observe Surgery and urology consults appreciated, no drain via IR given location, see discussion on IR's note. Follow blood cx and urine cx.  ENDOCRINE A:   No acute issues P:   Monitor with BMP  NEUROLOGIC A:   Acute metabolic encephalopathy in setting of uremia: resolved P:   RASS goal: 0 to -1 Monitor D/C sedation post extubation  FAMILY  - Updates: Patient updated bedside.  - Inter-disciplinary family meet or Palliative Care meeting due by:  10/31.  Smiley Houseman, MD PGY 2 Family Medicine   STAFF NOTE: I, Merrie Roof, MD FACP have personally reviewed patient's available data, including medical history, events of note, physical examination and test results as part of my evaluation. I have discussed with resident/NP and other care providers such as pharmacist, RN and RRT. In addition, I personally evaluated patient and elicited key findings of:  Awake, no distress, lungs clear, abdo soft, no fevers, K resolved, allow pos balance, add stop date ABX, unclear if infectious etiology ever existed, IS, pull back line or dc it, plat noted, suspicion moderate to low, will send HITT, hold hep, some arm edema, if duplex pos start argatroban, keep on monitor , get cbc in pm , to triad, tele, arm exam c/w infiltratted IV likely, will await [place]   Lavon Paganini. Titus Mould, MD, [place] Pgr: [facility] 06/13/2016 10:47 AM

## 2016-06-13 NOTE — Evaluation (Signed)
Physical Therapy Evaluation Patient Details Name: [name] MRN: [id number] DOB: August 13, 1946 Today's Date: 06/13/2016   History of Present Illness  70 yo admitted with hyperkalemia10/24 when daughter got to his house he was fairly lethargic so called EMS, since admission found to have testicular mass and possible intraabdominal abscess. PMHx: CHF, CKD, COPD, diverticulitis, ostomy  Clinical Impression  Pt admitted due to above with impaired cognition, balance, transfers and mobility who lives alone and does not have 24 hr assist available from family. Pt will benefit from acute therapy to maximize function and independence to decrease burden of care.   BP 95/54 before, 103/53 after HR 55 sats 94% on 3L    Follow Up Recommendations Supervision/Assistance - 24 hour;SNF    Equipment Recommendations  Rolling walker with 5" wheels    Recommendations for Other Services OT consult     Precautions / Restrictions Precautions Precautions: Fall      Mobility  Bed Mobility Overal bed mobility: Needs Assistance Bed Mobility: Supine to Sit     Supine to sit: Min assist     General bed mobility comments: cues for sequence with HOB 20 degrees, use of rail and increased time to complete  Transfers Overall transfer level: Needs assistance   Transfers: Sit to/from Stand;Stand Pivot Transfers Sit to Stand: Mod assist;+2 safety/equipment Stand pivot transfers: Mod assist;+2 safety/equipment       General transfer comment: cues for hand placement, foot position, anterior translation and safety. pt consistently crossing legs and lifting foot off of the floor in preparation to stand with max multimodal cueing. pt stood from bed and chair with assist to rise. With pivot assist for balance and to shift pelvis  Ambulation/Gait             General Gait Details: unable to attempt at this time due to pt impaired balance and difficulty following commands for movement  Stairs             Wheelchair Mobility    Modified Rankin (Stroke Patients Only)       Balance Overall balance assessment: Needs assistance   Sitting balance-Leahy Scale: Fair       Standing balance-Leahy Scale: Poor                               Pertinent Vitals/Pain Pain Assessment: No/denies pain    Home Living Family/patient expects to be discharged to:: Private residence Living Arrangements: Alone Available Help at Discharge: Family;Available PRN/intermittently Type of Home: House Home Access: Level entry     Home Layout: Two level;Able to live on main level with bedroom/bathroom Home Equipment: Cane - single point Additional Comments: pt inconsistent with whether he has a cane or walker    Prior Function Level of Independence: Independent         Comments: pt reports he could care for himself with all housework and cooking but daughter takes care of his bills and grocery shopping.      Hand Dominance        Extremity/Trunk Assessment   Upper Extremity Assessment: Generalized weakness           Lower Extremity Assessment: Generalized weakness      Cervical / Trunk Assessment: Normal  Communication   Communication: No difficulties  Cognition Arousal/Alertness: Awake/alert Behavior During Therapy: Flat affect Overall Cognitive Status: Impaired/Different from baseline Area of Impairment: Attention;Following commands;Safety/judgement   Current Attention Level: Sustained Memory: Decreased short-term  memory Following Commands: Follows one step commands inconsistently;Follows one step commands with increased time Safety/Judgement: Decreased awareness of safety          General Comments      Exercises     Assessment/Plan    PT Assessment Patient needs continued PT services  PT Problem List Decreased strength;Decreased mobility;Decreased activity tolerance;Decreased balance;Decreased cognition;Decreased safety awareness           PT Treatment Interventions Gait training;Functional mobility training;Balance training;Therapeutic exercise;Patient/family education;Therapeutic activities;DME instruction;Cognitive remediation    PT Goals (Current goals can be found in the Care Plan section)  Acute Rehab PT Goals Patient Stated Goal: return home PT Goal Formulation: With patient Time For Goal Achievement: 06/27/16 Potential to Achieve Goals: Fair    Frequency Min 3X/week   Barriers to discharge Decreased caregiver support      Co-evaluation               End of Session Equipment Utilized During Treatment: Gait belt;Oxygen Activity Tolerance: Patient tolerated treatment well Patient left: in chair;with call bell/phone within reach;with chair alarm set;with nursing/sitter in room Nurse Communication: Mobility status;Precautions         Time: [id number] PT Time Calculation (min) (ACUTE ONLY): 23 min   Charges:   PT Evaluation $PT Eval Moderate Complexity: 1 Procedure PT Treatments $Therapeutic Activity: 8-22 mins   PT G CodesMelford Aase 06/13/2016, 1:29 PM  Elwyn Reach, [place]

## 2016-06-13 NOTE — Progress Notes (Signed)
Patient Name: [name] Date of Encounter: 06/13/2016  Primary Cardiologist: Dr. Martinique  Hospital Problem List     Active Problems:   Acute hyperkalemia   Pressure injury of skin   Acute respiratory failure with hypoxia (HCC)   Endotracheally intubated   Scrotal mass   Sepsis ([place])   AKI (acute kidney injury) ([place])   NSVT (nonsustained ventricular tachycardia) ([place])   Dilated cardiomyopathy ([place])   Septic shock (HCC)     Subjective   Awake sitting up in the chair. No dyspnea, or palpitations.   Inpatient Medications    Scheduled Meds: . insulin aspart  2-6 Units Subcutaneous Q4H  . piperacillin-tazobactam (ZOSYN)  IV  3.375 g Intravenous Q8H  . sodium chloride flush  3 mL Intravenous Q12H   Continuous Infusions: . norepinephrine (LEVOPHED) Adult infusion Stopped (06/12/16 2040)  . vasopressin (PITRESSIN) infusion - *FOR SHOCK* Stopped (06/12/16 1500)   PRN Meds: sodium chloride, sodium chloride flush, sodium chloride flush   Vital Signs    Vitals:   06/13/16 0815 06/13/16 0900 06/13/16 1000 06/13/16 1100  BP:  (!) 95/54 (!) 104/44   Pulse:  63 (!) 41 (!) 53  Resp:  (!) 23 (!) 25 12  Temp: 98.1 F (36.7 C)     TempSrc: Oral     SpO2:  100% 99% 99%  Weight:      Height:        Intake/Output Summary (Last 24 hours) at 06/13/16 1124 Last data filed at 06/13/16 1100  Gross per 24 hour  Intake           600.81 ml  Output             1465 ml  Net          -864.19 ml   Filed Weights   06/11/16 0338 06/12/16 0406 06/13/16 0500  Weight: 183 lb 13.8 oz (83.4 kg) 184 lb 8.4 oz (83.7 kg) 184 lb 8.4 oz (83.7 kg)    Physical Exam   GEN: Well nourished, well developed, older black male in no acute distress.  HEENT: Grossly normal.  Neck: Supple, no JVD, carotid bruits, or masses. Cardiac: RRR, no murmurs, rubs, or gallops. No clubbing, cyanosis, edema.  Radials/DP/PT 2+ and equal bilaterally.  Respiratory:  Respirations regular and unlabored, Mlld  rhonchi GI: Soft, nontender, nondistended, BS + x 4. MS: no deformity or atrophy. Right upper extremity edema.  Skin: warm and dry, no rash. Neuro:  Strength and sensation are intact. Psych: AAOx3.  Normal affect.  Labs    CBC  Recent Labs  06/12/16 0612 06/13/16 0529  WBC 7.1 5.2  HGB 7.3* 7.0*  HCT 24.8* 24.2*  MCV 75.2* 77.1*  PLT 95* 83*   Basic Metabolic Panel  Recent Labs  06/12/16 0612 06/13/16 0529  NA 142 145  K 3.9 3.9  CL 115* 115*  CO2 23 26  GLUCOSE 158* 93  BUN 32* 26*  CREATININE 1.93* 1.75*  CALCIUM 7.9* 8.1*  MG 1.9 1.9  PHOS 1.4* 2.5   Liver Function Tests No results for input(s): AST, ALT, ALKPHOS, BILITOT, PROT, ALBUMIN in the last 72 hours. No results for input(s): LIPASE, AMYLASE in the last 72 hours. Cardiac Enzymes No results for input(s): CKTOTAL, CKMB, CKMBINDEX, TROPONINI in the last 72 hours. BNP Invalid input(s): POCBNP D-Dimer No results for input(s): DDIMER in the last 72 hours. Hemoglobin A1C No results for input(s): HGBA1C in the last 72 hours. Fasting Lipid Panel No  results for input(s): CHOL, HDL, LDLCALC, TRIG, CHOLHDL, LDLDIRECT in the last 72 hours. Thyroid Function Tests No results for input(s): TSH, T4TOTAL, T3FREE, THYROIDAB in the last 72 hours.  Invalid input(s): FREET3  Telemetry    SR with episodes of NSVT, - Personally Reviewed  ECG    N/A - Personally Reviewed  Radiology    Dg Chest Port 1 View  Result Date: 06/13/2016 CLINICAL DATA:  70 year old male with shortness of breath. Subsequent encounter. EXAM: PORTABLE CHEST 1 VIEW COMPARISON:  06/12/2016 FINDINGS: Left central line crosses midline with tip directed superiorly. Tip may be within the right internal jugular vein/innominate vein. Pulmonary vascular congestion.  Findings most notable centrally. Left base consolidation may represent atelectasis, infiltrate and/ or pleural effusion. Follow-up until clearance recommended. Cardiomegaly. Calcified  mildly tortuous aorta. Bilateral shoulder joint degenerative changes. IMPRESSION: Left central line crosses midline with tip directed superiorly. Tip may be within the right internal jugular vein/innominate vein. Pulmonary vascular congestion most notable centrally minimally changed from prior exam. Persistent consolidation left base. Cardiomegaly. Calcified mildly tortuous aorta. Electronically Signed   By: Genia Del M.D.   On: 06/13/2016 07:04   Dg Chest Port 1 View  Result Date: 06/12/2016 CLINICAL DATA:  Intubated. EXAM: PORTABLE CHEST 1 VIEW COMPARISON:  Yesterday. FINDINGS: The patient's chin is obscuring the superior aspect of the right lung. Endotracheal tube in satisfactory position. Nasogastric tube extending into the stomach with its tip in the proximal stomach. The left jugular catheter tip remains in the region of the origin of the right innominate vein. Stable enlarged cardiac silhouette. Dense left lower lobe airspace opacity. Aortic arch calcifications. Bilateral shoulder degenerative changes. IMPRESSION: 1. Dense left lower lobe atelectasis or pneumonia. 2. The left jugular catheter tip remains in the region of the origin of the right innominate vein. 3. Stable cardiomegaly. Electronically Signed   By: Claudie Revering M.D.   On: 06/12/2016 09:02    Cardiac Studies   TTE: 06/08/16  Study Conclusions  - Left ventricle: The cavity size was severely dilated. There was   moderate concentric hypertrophy. Systolic function was severely   reduced. The estimated ejection fraction was in the range of 20%   to 25%. Diffuse hypokinesis. There is akinesis of the   basal-midanteroseptal and inferoseptal myocardium. There is   akinesis of the entireinferior myocardium. Features are   consistent with a pseudonormal left ventricular filling pattern,   with concomitant abnormal relaxation and increased filling   pressure (grade 2 diastolic dysfunction). Doppler parameters are   consistent  with high ventricular filling pressure. - Left atrium: The atrium was severely dilated.  Patient Profile     70 year old male with PMH which is significant for CHF (although unclear what last EF is), CKD III, COPD, diverticulitis with perforation and now ostomy since 2009, and arthritis. He presented with lethargy and malaise, found to have K > 7.5, Creatinine was 11.3, had new fungating scrotal/testicular mass and possible intraabdominal mass, developed some non sustained VT and Cardiology was consulted.   Assessment & Plan    1. Ventricular tachycardia: In the setting of shock and cardiomyopathy. VT burden decreased significantly in last 24 hours with correction of metabolic abnormalities and IV  Amiodarone. Now predominantly in sinus rhythm. IV amiodarone was stopped yesterday. Blood pressure remains soft, would continue to avoid BB at this tim.   2. Cardiomyopathy: ? ischemic based on Echo findings of reduced EF of 20-25%, akinesis of the basal-midanteroseptal and inferoseptal myocardium. There isakinesis  of the entire inferior myocardium. Cardiac cath in 2006 showed normal coronary anatomy with low EF. Not a cath candidate at this time. Not a candidate for ACEi, beta blocker at this time. Does not appear to be volume overloaded.   3. ARF: Cr significantly improved. 1.75 today.  4. Shock- suspect more septic but could have cardiogenic component. Co-ox was check and ok. Now off levophed and vasopressin. Blood pressure maintaining but soft.   5. 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 Anemia: Hgb with downward trend. Consider transfusion.   Signed, Reino Bellis, NP  06/13/2016, 11:24 AM   Pt seen and examined  I agree with findings as noted above by L Mancel Bale.  Pt now extubated  Appears comfortable eating lunch  Lungs Mild rhonchi  Cardiac exam:  RRR with occasional skip  Ext without edema  Volume status is not bad.   BP right now is limiting medical Rx  Would add b  blocker when bp improves Note that myoview in 2006 showed scar in the inferolateral distribution  LVEF 26^ No ischemia I would not plan intervention for now.  COntinue telemetry   Add coreg when BP improves.  Dorris Carnes

## 2016-06-13 NOTE — Care Management Important Message (Signed)
Important Message  Patient Details  Name: [name] MRN: [id number] Date of Birth: [birth year]   Medicare Important Message Given:  Yes    Renika Shiflet 06/13/2016, 5:03 PM

## 2016-06-13 NOTE — Progress Notes (Signed)
   06/13/16 2026  Vitals  Temp 98.7 F (37.1 C)  Temp Source Oral  BP 95/62 (nurse notified)  Pulse Rate 83  Pulse Rate Source Dinamap  Resp 20  Oxygen Therapy  SpO2 99 %  O2 Device Nasal Cannula  O2 Flow Rate (L/min) 3 L/min  Height and Weight  Height 5\' 11"  (1.803 m)  Weight 83.8 kg (184 lb 11.2 oz)  Type of Scale Used Bed  Type of Weight Actual  BSA (Calculated - sq m) 2.05 sq meters  BMI (Calculated) 25.8  Weight in (lb) to have BMI = 25 178.9  Received pt as a transfer from 71M, pt oriented to room, call bell placed within reach, denied pain at this time.

## 2016-06-13 NOTE — Progress Notes (Signed)
Wasted 250mg  of Fentanyl in sink.  Witnessed by Donald Siva, RN.

## 2016-06-13 NOTE — Progress Notes (Signed)
*  PRELIMINARY RESULTS* Vascular Ultrasound Right upper extremity venous duplex has been completed.  Preliminary findings: no evidence of DVT. Superficial thrombosis is noted in the right cephalic vein from [facility] to axilla level.  Landry Mellow, RDMS, RVT  06/13/2016, 3:15 PM

## 2016-06-13 NOTE — Progress Notes (Signed)
Pt had 13 beats run of vtach, pt resting in bed, denied pain. Dr. Candyce Churn made aware. Will continue to monitor.

## 2016-06-14 ENCOUNTER — Encounter (HOSPITAL_COMMUNITY): Payer: Self-pay | Admitting: Student

## 2016-06-14 DIAGNOSIS — R109 Unspecified abdominal pain: Secondary | ICD-10-CM

## 2016-06-14 DIAGNOSIS — R188 Other ascites: Secondary | ICD-10-CM

## 2016-06-14 LAB — RENAL FUNCTION PANEL
Albumin: 2.1 g/dL — ABNORMAL LOW (ref 3.5–5.0)
Anion gap: 7 (ref 5–15)
BUN: 25 mg/dL — AB (ref 6–20)
CALCIUM: 8.2 mg/dL — AB (ref 8.9–10.3)
CHLORIDE: 117 mmol/L — AB (ref 101–111)
CO2: 22 mmol/L (ref 22–32)
CREATININE: 1.85 mg/dL — AB (ref 0.61–1.24)
GFR, EST AFRICAN AMERICAN: 41 mL/min — AB (ref >60)
GFR, EST NON AFRICAN AMERICAN: 35 mL/min — AB (ref >60)
Glucose, Bld: 108 mg/dL — ABNORMAL HIGH (ref 65–99)
Phosphorus: 1.5 mg/dL — ABNORMAL LOW (ref 2.5–4.6)
Potassium: 3.8 mmol/L (ref 3.5–5.1)
SODIUM: 146 mmol/L — AB (ref 135–145)

## 2016-06-14 LAB — CBC
HCT: 27.8 % — ABNORMAL LOW (ref 39.0–52.0)
HEMOGLOBIN: 7.9 g/dL — AB (ref 13.0–17.0)
MCH: 22.7 pg — AB (ref 26.0–34.0)
MCHC: 28.4 g/dL — ABNORMAL LOW (ref 30.0–36.0)
MCV: 79.9 fL (ref 78.0–100.0)
PLATELETS: 112 10*3/uL — AB (ref 150–400)
RBC: 3.48 MIL/uL — AB (ref 4.22–5.81)
RDW: 22 % — ABNORMAL HIGH (ref 11.5–15.5)
WBC: 6.9 10*3/uL (ref 4.0–10.5)

## 2016-06-14 LAB — HEPARIN INDUCED PLATELET AB (HIT ANTIBODY): HEPARIN INDUCED PLT AB: 0.319 {OD_unit} (ref 0.000–0.400)

## 2016-06-14 LAB — MAGNESIUM: MAGNESIUM: 1.9 mg/dL (ref 1.7–2.4)

## 2016-06-14 LAB — GLUCOSE, CAPILLARY
GLUCOSE-CAPILLARY: 122 mg/dL — AB (ref 65–99)
Glucose-Capillary: 147 mg/dL — ABNORMAL HIGH (ref 65–99)
Glucose-Capillary: 122 mg/dL — ABNORMAL HIGH (ref 65–99)
Glucose-Capillary: 60 mg/dL — ABNORMAL LOW (ref 65–99)

## 2016-06-14 MED ORDER — ENSURE ENLIVE PO LIQD
237.0000 mL | Freq: Two times a day (BID) | ORAL | Status: DC
  Administered 2016-06-14 – 2016-06-15 (×2): 237 mL via ORAL

## 2016-06-14 MED ORDER — CARVEDILOL 3.125 MG PO TABS
3.1250 mg | ORAL_TABLET | Freq: Two times a day (BID) | ORAL | Status: DC
  Administered 2016-06-14 – 2016-06-15 (×2): 3.125 mg via ORAL
  Filled 2016-06-14 (×2): qty 1

## 2016-06-14 NOTE — Progress Notes (Signed)
Patient Name: [name] Date of Encounter: 06/14/2016  Primary Cardiologist: Dr. Martinique  Hospital Problem List     Active Problems:   Acute hyperkalemia   Pressure injury of skin   Acute respiratory failure with hypoxia (HCC)   Endotracheally intubated   Scrotal mass   Sepsis (HCC)   AKI (acute kidney injury) ([place])   NSVT (nonsustained ventricular tachycardia) (HCC)   Dilated cardiomyopathy ([place])   Septic shock (HCC)   Epigastric pain   Abdominal fluid collection   Abdominal pain     Subjective   Awake, sitting on the side of the bed. Frustrated, wants to go home.   Inpatient Medications    Scheduled Meds: . insulin aspart  2-6 Units Subcutaneous TID WC & HS  . piperacillin-tazobactam (ZOSYN)  IV  3.375 g Intravenous Q8H  . sodium chloride flush  3 mL Intravenous Q12H   Continuous Infusions:   PRN Meds: sodium chloride   Vital Signs    Vitals:   06/13/16 2000 06/13/16 2026 06/14/16 0100 06/14/16 0501  BP: (!) 109/56 95/62 99/67  107/61  Pulse: 92 83 99 86  Resp: (!) 27 20 20  (!) 22  Temp:  98.7 F (37.1 C) 98.9 F (37.2 C) 98.1 F (36.7 C)  TempSrc:  Oral Oral Oral  SpO2: 100% 99% 100% 90%  Weight:  184 lb 11.2 oz (83.8 kg)  184 lb (83.5 kg)  Height:  5\' 11"  (1.803 m)      Intake/Output Summary (Last 24 hours) at 06/14/16 1134 Last data filed at 06/14/16 [id number]  Gross per 24 hour  Intake            402.5 ml  Output             1195 ml  Net           -792.5 ml   Filed Weights   06/13/16 0500 06/13/16 2026 06/14/16 0501  Weight: 184 lb 8.4 oz (83.7 kg) 184 lb 11.2 oz (83.8 kg) 184 lb (83.5 kg)    Physical Exam   GEN: Well nourished, well developed, older black male in no acute distress.  HEENT: Grossly normal.  Neck: Supple, no JVD, carotid bruits, or masses. Cardiac: RRR, no murmurs, rubs, or gallops. No clubbing, cyanosis, edema.  Radials/DP/PT 2+ and equal bilaterally.  Respiratory:  Respirations regular and unlabored, Mlld  rhonchi GI: Soft, nontender, nondistended, BS + x 4. MS: no deformity or atrophy. Right upper extremity edema.  Skin: warm and dry, no rash. Neuro:  Strength and sensation are intact. Psych: AAOx3.  Normal affect.  Labs    CBC  Recent Labs  06/13/16 0529 06/14/16 0223  WBC 5.2 6.9  HGB 7.0* 7.9*  HCT 24.2* 27.8*  MCV 77.1* 79.9  PLT 83* [id number]*   Basic Metabolic Panel  Recent Labs  06/13/16 0529 06/14/16 0223  NA 145 146*  K 3.9 3.8  CL 115* 117*  CO2 26 22  GLUCOSE 93 108*  BUN 26* 25*  CREATININE 1.75* 1.85*  CALCIUM 8.1* 8.2*  MG 1.9 1.9  PHOS 2.5 1.5*   Liver Function Tests  Recent Labs  06/14/16 0223  ALBUMIN 2.1*   No results for input(s): LIPASE, AMYLASE in the last 72 hours. Cardiac Enzymes No results for input(s): CKTOTAL, CKMB, CKMBINDEX, TROPONINI in the last 72 hours. BNP Invalid input(s): POCBNP D-Dimer No results for input(s): DDIMER in the last 72 hours. Hemoglobin A1C No results for input(s): HGBA1C in the last 72 hours.  Fasting Lipid Panel No results for input(s): CHOL, HDL, LDLCALC, TRIG, CHOLHDL, LDLDIRECT in the last 72 hours. Thyroid Function Tests No results for input(s): TSH, T4TOTAL, T3FREE, THYROIDAB in the last 72 hours.  Invalid input(s): FREET3  Telemetry    SR with episodes of NSVT,and SVT - Personally Reviewed  ECG    N/A - Personally Reviewed  Radiology    Dg Chest Port 1 View  Result Date: 06/13/2016 CLINICAL DATA:  70 year old male with shortness of breath. Subsequent encounter. EXAM: PORTABLE CHEST 1 VIEW COMPARISON:  06/12/2016 FINDINGS: Left central line crosses midline with tip directed superiorly. Tip may be within the right internal jugular vein/innominate vein. Pulmonary vascular congestion.  Findings most notable centrally. Left base consolidation may represent atelectasis, infiltrate and/ or pleural effusion. Follow-up until clearance recommended. Cardiomegaly. Calcified mildly tortuous aorta. Bilateral  shoulder joint degenerative changes. IMPRESSION: Left central line crosses midline with tip directed superiorly. Tip may be within the right internal jugular vein/innominate vein. Pulmonary vascular congestion most notable centrally minimally changed from prior exam. Persistent consolidation left base. Cardiomegaly. Calcified mildly tortuous aorta. Electronically Signed   By: Genia Del M.D.   On: 06/13/2016 07:04    Cardiac Studies   TTE: 06/08/16  Study Conclusions  - Left ventricle: The cavity size was severely dilated. There was   moderate concentric hypertrophy. Systolic function was severely   reduced. The estimated ejection fraction was in the range of 20%   to 25%. Diffuse hypokinesis. There is akinesis of the   basal-midanteroseptal and inferoseptal myocardium. There is   akinesis of the entireinferior myocardium. Features are   consistent with a pseudonormal left ventricular filling pattern,   with concomitant abnormal relaxation and increased filling   pressure (grade 2 diastolic dysfunction). Doppler parameters are   consistent with high ventricular filling pressure. - Left atrium: The atrium was severely dilated.  Patient Profile     70 year old male with PMH which is significant for CHF (although unclear what last EF is), CKD III, COPD, diverticulitis with perforation and now ostomy since 2009, and arthritis. He presented with lethargy and malaise, found to have K > 7.5, Creatinine was 11.3, had new fungating scrotal/testicular mass and possible intraabdominal mass, developed some non sustained VT and Cardiology was consulted.   Assessment & Plan    1. Ventricular tachycardia: In the setting of shock and cardiomyopathy. VT burden decreased significantly  with correction of metabolic abnormalities and IV  amiodarone. Now predominantly in sinus rhythm. IV amiodarone was stopped yesterday. Blood pressure remains soft, would add coreg once blood pressure improves.  2.  Cardiomyopathy: Echo findings of reduced EF of 20-25%, akinesis of the basal-midanteroseptal and inferoseptal myocardium. There isakinesis of the entire inferior myocardium. Cardiac cath in 2006 showed normal coronary anatomy with low EF. Not a cath candidate at this time. Not a candidate for ACEi, beta blocker at this time. Does not appear to be volume overloaded.   3. ARF: Cr with slight elevation today at 1.8.  4. Shock- suspect more septic but could have cardiogenic component. Co-ox was check and ok. Now off levophed and vasopressin. Blood pressure maintaining but soft.   5. 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  6. Anemia: Hgb 7.9 today.   Signed, Reino Bellis, NP  06/14/2016, 11:34 AM   Pt seen and examined  Agree with findings as noted by  L Mancel Bale above. Pt appears comfortable in bed  ON exam:  Lungs with  rhonchi  Cardiac exam RRR  NO S3  Ext with triv edema   I would recomm trial of coreg 3.124 mg bid  Follow BP  Continue telemetry.  Dorris Carnes

## 2016-06-14 NOTE — Progress Notes (Signed)
Nutrition Follow Up  DOCUMENTATION CODES:   Not applicable  INTERVENTION:    Ensure Enlive po BID, each supplement provides 350 kcal and 20 grams of protein  NEW NUTRITION DIAGNOSIS:   Increased nutrient needs related to chronic illness as evidenced by estimated needs, ongoing   GOAL:   Patient will meet greater than or equal to 90% of their needs, progressing  MONITOR:   PO intake, Supplement acceptance, Labs, Weight trends, I & O's  ASSESSMENT:   70 yo Male with PMH significant for CHF, CKD III, COPD, diverticulitis with perforation and now ostomy since 2009.  Per family he has been feeling unwell for probably about a week, but notably on 10/22 he started getting more lethargic. 10/24 when daughter got to his house he was fairly lethargic so she called EMS and he was brought to the ED where laboratory evaluation significant for K >7.5, CO2 8, Creatinine 11.31. He was temporized and K fell to 6.9. He was seen by nephrology who recommended bicarb and kayexalate and potentially HD if not improving. New fungating scrotal/testicular mass identified in ED. CT abdomen and pelvis demonstrated possible intraabdominal abscess.  Pt extubated 10/29. Admitted with acute COPD exacerbation. Advanced to Heart Healthy diet 10/31. PO intake 40-50% per flowsheet records. Will order nutrition supplements to maximize kcal, protein intake.  Diet Order:  Diet Heart Room service appropriate? Yes; Fluid consistency: Thin  Skin:  Reviewed, no issues   CBG (last 3)   Recent Labs  06/13/16 2146 06/14/16 0620 06/14/16 1155  GLUCAP 188* 147* 122*   Last BM:  10/25  Height:   Ht Readings from Last 1 Encounters:  06/13/16 5\' 11"  (1.803 m)    Weight:   Wt Readings from Last 1 Encounters:  06/14/16 184 lb (83.5 kg)    Ideal Body Weight:  73 kg  BMI:  Body mass index is 25.66 kg/m.  Estimated Nutritional Needs:   Kcal:  1800-2000  Protein:  90-100 gm  Fluid:  1.8-2.0  L  EDUCATION NEEDS:   No education needs identified at this time  Arthur Holms, RD, LDN Pager #: [phone] After-Hours Pager #: [phone]

## 2016-06-14 NOTE — Clinical Social Work Note (Addendum)
CSW consulted for SNF placement. Patient refused SNF placement. Patient states he will go home and his daughter will take care of him. CSW reached out to daughter per patients permisson, left voicemail.   10:30- CSW spoke with daughter. Daughter states there will be someone moving in with her dad however, he is going to need to go to SNF in order to become more independent. Patient states she is not a physical therapist and the patient needs more help then will be provided at home. Daughter is planning to come to the hospital to talk to her father and will call CSW.  [address], [place]

## 2016-06-14 NOTE — Consult Note (Signed)
[facility] Primary Care Navigator  06/14/2016  [name] 04/07/1946 [id number]  Met with patient at the bedside to identify possible discharge needs.  Patient was unable to recall his primary care provider's name. He gave his daughter's  [name]) phone number to contact.  Called daughter and was able to confirm that patient's PCP is Dr. [name] with [facility]. Daughter also states she will come over to talk and convince patient to go to skilled facility for rehab prior to returning home. Patient's nurse updated.   Patient shared using [facility] to obtain medications and manages his medicines at home without difficulty.   He is able to drive prior to admission but daughters will be able to provide transportation for him if needed per patient. He is independent with self care at home before admission and he is depending on daughters to assist with his care. He states that daughter [name] will be moving in to his house to take care of him. He is adamantly refusing to go to skilled nursing facility as what was earlier recommended.  Patient encouraged to follow-up with his primary care provider after discharge to home.   For additional questions please contact:  Edwena Felty A. Belma Dyches, BSN, RN-BC [facility] PRIMARY CARE Navigator Cell: [phone]

## 2016-06-14 NOTE — Clinical Social Work Note (Addendum)
Clinical Social Work Assessment  Patient Details  Name: [name] MRN: [id number] Date of Birth: 04-18-1946  Date of referral:  06/14/16               Reason for consult:  Facility Placement                Permission sought to share information with:  Family Supports Permission granted to share information::  Yes, Verbal Permission Granted  Name::     [name]  Agency::     Relationship::  Daughter  Contact Information:  [phone]  Housing/Transportation Living arrangements for the past 2 months:  [place] of Information:  Patient Patient Interpreter Needed:  None Criminal Activity/Legal Involvement Pertinent to Current Situation/Hospitalization:  No - Comment as needed Significant Relationships:  Adult Children Lives with:  Self Do you feel safe going back to the place where you live?  Yes Need for family participation in patient care:  Yes (Comment)  Care giving concerns:  No family or friends at bedside at this time.   Social Worker assessment / plan: CSW spoke with patient at bedside. Patient continuously reported "I need to get out of here." Patient did engaged in conversation about daughter. Patient lived alone in his house prior to admission. Patient states his daughter is moving in with him to help him until he can live alone. Patient refuses SNF placement at discharge. Patient reports he does not need to go to a facility when he has his daughters help. Patient reports feeling safe to go home when medically stable to discharge. Patient gave CSW verbal permission to reach out to sister, [name], [place] left voicemail--awaiting call back. CSW encouraged patient to reach out to CSW if he changes his mind and wants a SNF placement. Patient plans to d/c home with his daughter.   Employment status:  Retired Forensic scientist:  Medicare PT Recommendations:  [place] / Referral to community resources:      Patient/Family's Response to care: Patient verbalized understanding of CSW role and appreciation of support. Patient is irritable and wants to go home. Patient refuse SNF placement when medically stable for d/c.  Patient/Family's Understanding of and Emotional Response to Diagnosis, Current Treatment, and Prognosis:  Patient irritable and continuously reported "I need to get out of here." CSW exaplained purpose of SNF placement at d/c, patient refused. Patient planes to d/c home with daughter. Patient plans for daughter to provide care.  Emotional Assessment Appearance:  Appears stated age Attitude/Demeanor/Rapport:   (Patient was engaged.) Affect (typically observed):  Agitated, Irritable (Patient reports "I need to get out of here.") Orientation:  Oriented to Self, Oriented to Place, Oriented to  Time, Oriented to Situation Alcohol / Substance use:  Not Applicable Psych involvement (Current and /or in the community):  No (Comment)  Discharge Needs  Concerns to be addressed:  Care Coordination Readmission within the last 30 days:  No Current discharge risk:  Dependent with Mobility Barriers to Discharge:  Continued Medical Work up   [employer], [place]

## 2016-06-14 NOTE — Progress Notes (Signed)
[facility] PROGRESS NOTE  [name] [id number] DOB: 16-Jul-1946 DOA: 06/07/2016   PCP: No primary care provider on file.     Assessment/Plan: Active Problems:   Acute hyperkalemia   Pressure injury of skin   Acute respiratory failure with hypoxia (HCC)   Endotracheally intubated   Scrotal mass   Sepsis (HCC)   AKI (acute kidney injury) ([place])   NSVT (nonsustained ventricular tachycardia) (HCC)   Dilated cardiomyopathy (HCC)   Septic shock (HCC)   Epigastric pain   Abdominal fluid collection   Abdominal pain   70 year old male with PMH as below which is significant for CHF, CKD III, COPD, diverticulitis with perforation and now ostomy since 2009, and arthritis. Per family he has been feeling unwell for probably about a week, but notably on 10/22 he started getting more lethargic. No specific complaints, just "didn't feel well". 10/24 when daughter got to his house he was fairly lethargic so she called EMS and he was brought to the ED where laboratory evaluation significant for K >7.5, CO2 8, Creatinine 11.31. He was temporized and K fell to 6.9. He was seen by nephrology who recommended bicarb and kayexalate and potentially HD if not improving. New fungating scrotal/testicular mass identified in ED.    For his profound   hyperkalemia he was given Kayexelate and bicarb drip/nephrology consulted. Also found to have a possible intra-abdominal abscess and was started on Zosyn on admit.CT abdomen and pelvis demonstrated possible intraabdominal abscess. Developed hypotension/shock requiring Neo-synephrine likely septic shock. Ectopy with nonsustained Vtach possibly due to sepsis initially required amiodarone, cardiology consulted, off amiodarone as per cards (10/29).   Was extubated on 10/29 AM. Levophed was stopped at 2040 on 10/30. Vasopressin stopped 1500 10/29. MAPs have been in the upper 60s mainly with intermittent low 60s. Transfer to [facility]  10/31  Assessment and  plan Sepsis/septic shock/cardiogenic shock Now off levophed and vasopressin. Blood pressure maintaining but soft.   Ventricular tachycardia-  In the setting of shock and cardiomyopathy. VT improved with correction of metabolic abnormalities and IV Amiodarone. Now predominantly in sinus rhythm. IV amiodarone was stopped yesterday. 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 He would require surgical excision. 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  However, definitive enhancement is not able to be determined in the absence of IV contrast.  He cannot receive contrast currently considering his acute renal failure. evaluation with a renal ultrasound or noncontrast MRI may be helpful if he cannot receive intravenous contrast due to renal function.  Ischemic cardiomyopathy-reduced EF of 20-25%, akinesis of the basal-midanteroseptal and inferoseptal myocardium. There isakinesis of the entire inferior myocardium. Cardiac cath in 2006 showed normal coronary anatomy with low EF. Not a cath candidate at this time. Not a candidate for ACEi, beta blocker at this time.Add coreg when BP improves  Acute kidney injury on CKD III >acute episode likely due to ACE-I, dehydration, lasix: creatinine improving. (unknown baseline), creatinine 11.3 on admission, now 1.85    Respiratory failure-intubated for airway protection, extubated 10/29, had COPD without acute exacerbation Patient still requiring 3 L of oxygen  Thrombocytopenia-HIT panel sent, right upper extremity swelling, no DVT Superficial thrombosis  right cephalic vein from [facility] to axilla level Heparin discontinued 10/30, platelet  count is improving   Anemia of chronic  disease-stable, no active bleeding   DVT prophylaxsis  SCDs  Code Status:  Full code      Family Communication: Discussed in detail with the patient, all imaging results, lab results explained to the patient   Disposition Plan PT OT evaluation when stable, not stable  for discharge, very tenuous breathing   CULTURES: Blood Cx 10/24 > no growth final  Urine 10/24 > no growth final   ANTIBIOTICS: Zosyn 10/24 >  SIGNIFICANT EVENTS: 10/24: admit for hyperkalemia, lethargy 10/25: ectopy started on amiodarone bolus and drip. Started on lidocaine bolus and drip. Seen by cardiology  LINES/TUBES: ETT 10/25>>>10/29 L IJ TLC 10/25>>>  Procedures:  None          HPI/Subjective: Patient unable to speak in full sentences, still quite short of breath  Objective: Vitals:   06/13/16 2000 06/13/16 2026 06/14/16 0100 06/14/16 0501  BP: (!) 109/56 95/62 99/67  107/61  Pulse: 92 83 99 86  Resp: (!) 27 20 20  (!) 22  Temp:  98.7 F (37.1 C) 98.9 F (37.2 C) 98.1 F (36.7 C)  TempSrc:  Oral Oral Oral  SpO2: 100% 99% 100% 90%  Weight:  83.8 kg (184 lb 11.2 oz)  83.5 kg (184 lb)  Height:  5\' 11"  (1.803 m)      Intake/Output Summary (Last 24 hours) at 06/14/16 0918 Last data filed at 06/14/16 [id number]  Gross per 24 hour  Intake            427.5 ml  Output             1455 ml  Net          -1027.5 ml    Exam:  Examination: GEN: Well nourished, well developed, older black male in no acute distress.  HEENT: Grossly normal.  Neck: Supple, no JVD, carotid bruits, or masses. Cardiac: RRR, no murmurs, rubs, or gallops. No clubbing, cyanosis, edema.  Radials/DP/PT 2+ and equal bilaterally.  Respiratory:  Respirations regular and Labored breathing GI: Soft, nontender, nondistended, BS + x 4. MS: no deformity or atrophy. Right upper extremity edema.  Skin: warm and dry, no rash. Neuro:  Strength and sensation are intact. Psych: AAOx3.  Normal affect.    Data Reviewed: I  have personally reviewed following labs and imaging studies  Micro Results Recent Results (from the past 240 hour(s))  Blood culture (routine x 2)     Status: None   Collection Time: 06/07/16  1:20 PM  Result Value Ref Range Status   Specimen Description BLOOD RIGHT ANTECUBITAL  Final   Special Requests BOTTLES DRAWN AEROBIC AND ANAEROBIC  5CC  Final   Culture NO GROWTH 5 DAYS  Final   Report Status 06/12/2016 FINAL  Final  Blood culture (routine x 2)     Status: None   Collection Time: 06/07/16  3:24 PM  Result Value Ref Range Status   Specimen Description BLOOD RIGHT HAND  Final   Special Requests IN PEDIATRIC BOTTLE 2CC  Final   Culture NO GROWTH 5 DAYS  Final   Report Status 06/12/2016 FINAL  Final  MRSA PCR Screening     Status: None   Collection Time: 06/07/16  8:33 PM  Result Value Ref Range Status   MRSA by PCR NEGATIVE NEGATIVE Final    Comment:        The GeneXpert MRSA Assay (FDA approved for NASAL specimens only), is one component of a comprehensive MRSA  colonization surveillance program. It is not intended to diagnose MRSA infection nor to guide or monitor treatment for MRSA infections.   Urine culture     Status: None   Collection Time: 06/07/16  8:50 PM  Result Value Ref Range Status   Specimen Description URINE, CATHETERIZED  Final   Special Requests NONE  Final   Culture NO GROWTH  Final   Report Status 06/09/2016 FINAL  Final    Radiology Reports Ct Abdomen Pelvis Wo Contrast  Result Date: 06/07/2016 CLINICAL DATA:  Abdominal pain. Scrotal swelling. Evaluate for scrotal mass. EXAM: CT ABDOMEN AND PELVIS WITHOUT CONTRAST TECHNIQUE: Multidetector CT imaging of the abdomen and pelvis was performed following the standard protocol without IV contrast. COMPARISON:  02/11/2008 FINDINGS: Lower chest: Lung bases are clear. No effusions. Heart is normal size. - Hepatobiliary: No focal hepatic abnormality. Gallbladder unremarkable. Pancreas: No focal abnormality  or ductal dilatation. Spleen: No focal abnormality.  Normal size. Adrenals/Urinary Tract: Multiple hyperdense and hypodense lesions within the kidneys bilaterally, likely a combination of simple and complex/ hemorrhagic cysts. The largest is in the right no pole measuring 5.6 cm. This previously measured 3.3 cm. Cannot completely exclude that some of these could represent solid renal lesions. No hydronephrosis. 2.1 cm left adrenal nodule compared to 1.8 cm previously. Right adrenal gland and urinary bladder grossly unremarkable. Stomach/Bowel: Postoperative changes noted within the colon. Right lower quadrant ostomy present. There is Collie Siad is an layering fluid collection noted in the pelvis with surrounding suture lines. This presumably represents a dilated denervation segment of distal bowel, but is difficult to exclude separate pelvic abscess. This measures up to 7.6 cm. Small bowel and stomach decompressed. Vascular/Lymphatic: Diffuse iliac and aortic calcifications. No aneurysm. No adenopathy. Reproductive: Very large bilateral hydroceles, left larger than right. Other: No free fluid or free air. Musculoskeletal: No acute bony abnormality or focal bone lesion. IMPRESSION: Changes of prior bowel surgery with right lower quadrant ostomy. There is a gas and fluid collection in the pelvis measuring 7.6 cm with surrounding suture. This is intimately associated with the sigmoid colon remnant and may represent a segment of these are patent dilated bowel, but I cannot completely exclude that this is an abscess adjacent to the Hartmann's pouch. Mixture of low-density and high-density lesions within the kidneys bilaterally, enlarging since prior study. The largest cyst in the midpole measuring 5.6 cm compared to 3.3 cm previously. While these may reflect a combination of simple and hemorrhagic/complex cysts, I cannot exclude solid renal lesions/ neoplasms. Recommend further evaluation with contrast-enhanced CT or MRI. Left  adrenal nodule, slightly larger than prior study, nonspecific. Aortoiliac atherosclerosis. Large bilateral hydroceles, left greater than right. Electronically Signed   By: Rolm Baptise M.D.   On: 06/07/2016 16:31   Dg Chest Port 1 View  Result Date: 06/13/2016 CLINICAL DATA:  70 year old male with shortness of breath. Subsequent encounter. EXAM: PORTABLE CHEST 1 VIEW COMPARISON:  06/12/2016 FINDINGS: Left central line crosses midline with tip directed superiorly. Tip may be within the right internal jugular vein/innominate vein. Pulmonary vascular congestion.  Findings most notable centrally. Left base consolidation may represent atelectasis, infiltrate and/ or pleural effusion. Follow-up until clearance recommended. Cardiomegaly. Calcified mildly tortuous aorta. Bilateral shoulder joint degenerative changes. IMPRESSION: Left central line crosses midline with tip directed superiorly. Tip may be within the right internal jugular vein/innominate vein. Pulmonary vascular congestion most notable centrally minimally changed from prior exam. Persistent consolidation left base. Cardiomegaly. Calcified mildly tortuous aorta. Electronically Signed   By:  Genia Del M.D.   On: 06/13/2016 07:04   Dg Chest Port 1 View  Result Date: 06/12/2016 CLINICAL DATA:  Intubated. EXAM: PORTABLE CHEST 1 VIEW COMPARISON:  Yesterday. FINDINGS: The patient's chin is obscuring the superior aspect of the right lung. Endotracheal tube in satisfactory position. Nasogastric tube extending into the stomach with its tip in the proximal stomach. The left jugular catheter tip remains in the region of the origin of the right innominate vein. Stable enlarged cardiac silhouette. Dense left lower lobe airspace opacity. Aortic arch calcifications. Bilateral shoulder degenerative changes. IMPRESSION: 1. Dense left lower lobe atelectasis or pneumonia. 2. The left jugular catheter tip remains in the region of the origin of the right innominate  vein. 3. Stable cardiomegaly. Electronically Signed   By: Claudie Revering M.D.   On: 06/12/2016 09:02   Dg Chest Port 1 View  Result Date: 06/11/2016 CLINICAL DATA:  Endotracheal tube placement EXAM: PORTABLE CHEST 1 VIEW COMPARISON:  06/10/2016 FINDINGS: Endotracheal tube with the tip 2.7 cm above the carina. Nasogastric tube coursing below the diaphragm projecting over the stomach. Left IJ central venous catheter with the tip located at the confluence of the right IJ - brachycephalic pain and directed cranially ; recommend repositioning prior to use. Mild bilateral interstitial prominence. No focal consolidation, pleural effusion or pneumothorax. Stable cardiomegaly. Moderate osteoarthritis of bilateral glenohumeral joints. IMPRESSION: 1. Endotracheal tube with the tip 2.7 cm above the carina. 2. Left IJ central venous catheter with the tip located at the confluence of the right IJ - brachycephalic pain and directed cranially ; recommend repositioning prior to use. Electronically Signed   By: Kathreen Devoid   On: 06/11/2016 08:59   Dg Chest Port 1 View  Result Date: 06/10/2016 CLINICAL DATA:  Endotracheal tube EXAM: PORTABLE CHEST 1 VIEW COMPARISON:  Yesterday FINDINGS: Endotracheal tube tip between the clavicular heads and carina. Left IJ central line crosses into the right brachiocephalic vein, unchanged. Nasogastric tube reaches the stomach. Worsening left basilar aeration with obscured diaphragm. No pneumothorax or edema. IMPRESSION: 1. Unchanged positioning of tubes and left IJ line with tip at the right brachiocephalic vein. 2. Interval worsening retrocardiac aeration, likely atelectasis. Electronically Signed   By: Monte Fantasia M.D.   On: 06/10/2016 08:44   Dg Chest Port 1 View  Result Date: 06/09/2016 CLINICAL DATA:  History of endotracheal tube. Hypertension. Arthritis. COPD. EXAM: PORTABLE CHEST 1 VIEW COMPARISON:  1 a prior FINDINGS: Left internal jugular line is again malpositioned, with  tip directed cephalad, with tip in the right brachiocephalic vein. This is unchanged. Endotracheal tube terminates 4.1 cm above carina.Nasogastric tube extends beyond the inferior aspect of the film. Numerous leads and wires project over the chest. Cardiomegaly accentuated by AP portable technique. Probable small left pleural effusion. No pneumothorax. Mild pulmonary venous congestion. Left base airspace disease persists, improved. IMPRESSION: Cardiomegaly with mild pulmonary venous congestion. Probable small left pleural effusion with improved adjacent atelectasis. Malpositioned left internal jugular line, as detailed on prior exams. Electronically Signed   By: Abigail Miyamoto M.D.   On: 06/09/2016 07:26   Dg Chest Port 1 View  Result Date: 06/08/2016 CLINICAL DATA:  Central line placement EXAM: PORTABLE CHEST 1 VIEW COMPARISON:  06/08/2016 FINDINGS: Stable endotracheal and NG tube position. Again noted left IJ central line going cephalad in right brachiocephalic vein. No pneumothorax. IMPRESSION: Again noted left IJ central line with tip going cephalad in right brachiocephalic vein. No pneumothorax. These results were called by telephone at the  time of interpretation on 06/08/2016 at 3:01 pm to patient's ICU nurse Kieth Brightly, who verbally acknowledged these results. Electronically Signed   By: Lahoma Crocker M.D.   On: 06/08/2016 15:02   Dg Chest Port 1 View  Result Date: 06/08/2016 CLINICAL DATA:  Endotracheal tube placement. EXAM: PORTABLE CHEST 1 VIEW COMPARISON:  06/07/2016 FINDINGS: New endotracheal tube tip projects 2.9 cm above the Carina. Nasal/orogastric tube passes to the level of the medial left hemidiaphragm, below the included field of view, likely in the stomach. Left internal jugular central venous line has its tip extending towards the right lung apex, consistent with it extending upward into the right brachiocephalic vein. No pneumothorax.  Lungs are clear. IMPRESSION: 1. Left internal jugular  central venous line catheter is malpositioned positioned, with its tip projecting within the right brachiocephalic vein are central right internal jugular vein. 2. Well-positioned endotracheal tube. 3. Nasal/orogastric tube passes below the included field of view likely within the stomach. 4. Lungs remain clear. 5. No pneumothorax. Electronically Signed   By: Lajean Manes M.D.   On: 06/08/2016 12:04   Dg Chest Port 1 View  Result Date: 06/07/2016 CLINICAL DATA:  70 year old male with shortness breath and weakness for 2 days. Initial encounter. EXAM: PORTABLE CHEST 1 VIEW COMPARISON:  03/21/2005. FINDINGS: Portable exam with multiple overlying leads. No infiltrate, congestive heart failure or pneumothorax. No plain film evidence of pulmonary malignancy. Heart size within normal limits. Calcified slightly tortuous aorta. Bilateral shoulder joint degenerative changes. IMPRESSION: No acute abnormality. Aortic atherosclerosis. Electronically Signed   By: Genia Del M.D.   On: 06/07/2016 13:53     CBC  Recent Labs Lab 06/07/16 1335  06/10/16 0502 06/11/16 0347 06/12/16 0612 06/13/16 0529 06/14/16 0223  WBC 12.6*  < > 7.0 6.7 7.1 5.2 6.9  HGB 12.5*  < > 8.7* 7.8* 7.3* 7.0* 7.9*  HCT 40.7  < > 28.3* 26.1* 24.8* 24.2* 27.8*  PLT 286  < > 125* 111* 95* 83* 112*  MCV 75.2*  < > 73.1* 75.9* 75.2* 77.1* 79.9  MCH 23.1*  < > 22.5* 22.7* 22.1* 22.3* 22.7*  MCHC 30.7  < > 30.7 29.9* 29.4* 28.9* 28.4*  RDW 21.8*  < > 21.4* 21.8* 21.7* 21.7* 22.0*  LYMPHSABS 0.6*  --   --   --   --   --   --   MONOABS 0.8  --   --   --   --   --   --   EOSABS 0.0  --   --   --   --   --   --   BASOSABS 0.0  --   --   --   --   --   --   < > = values in this interval not displayed.  Chemistries   Recent Labs Lab 06/07/16 1423  06/09/16 0223  06/11/16 0347 06/11/16 2239 06/12/16 0612 06/13/16 0529 06/14/16 0223  NA 134*  < > 141  < > 145 142 142 145 146*  K >7.5*  < > 3.5  < > 3.6 3.9 3.9 3.9 3.8  CL  115*  < > 111  < > 114* 113* 115* 115* 117*  CO2 8*  < > 22  < > 25 23 23 26 22   GLUCOSE 133*  < > 213*  < > 133* 124* 158* 93 108*  BUN 139*  < > 82*  < > 40* 34* 32* 26* 25*  CREATININE 11.31*  < >  5.43*  < > 2.55* 2.01* 1.93* 1.75* 1.85*  CALCIUM 9.9  < > 8.6*  < > 7.8* 7.7* 7.9* 8.1* 8.2*  MG  --   < > 2.0  < > 1.6* 2.1 1.9 1.9 1.9  AST 17  --  15  --   --   --   --   --   --   ALT 31  --  17  --   --   --   --   --   --   ALKPHOS 103  --  67  --   --   --   --   --   --   BILITOT 0.7  --  0.7  --   --   --   --   --   --   < > = values in this interval not displayed. ------------------------------------------------------------------------------------------------------------------ estimated creatinine clearance is 39 mL/min (by C-G formula based on SCr of 1.85 mg/dL (H)). ------------------------------------------------------------------------------------------------------------------ No results for input(s): HGBA1C in the last 72 hours. ------------------------------------------------------------------------------------------------------------------ No results for input(s): CHOL, HDL, LDLCALC, TRIG, CHOLHDL, LDLDIRECT in the last 72 hours. ------------------------------------------------------------------------------------------------------------------ No results for input(s): TSH, T4TOTAL, T3FREE, THYROIDAB in the last 72 hours.  Invalid input(s): FREET3 ------------------------------------------------------------------------------------------------------------------ No results for input(s): VITAMINB12, FOLATE, FERRITIN, TIBC, IRON, RETICCTPCT in the last 72 hours.  Coagulation profile No results for input(s): INR, PROTIME in the last 168 hours.  No results for input(s): DDIMER in the last 72 hours.  Cardiac Enzymes  Recent Labs Lab 06/08/16 0319 06/08/16 0754 06/08/16 1453  TROPONINI 0.09* 0.11* 0.12*    ------------------------------------------------------------------------------------------------------------------ Invalid input(s): POCBNP   CBG:  Recent Labs Lab 06/13/16 0812 06/13/16 1131 06/13/16 1610 06/13/16 2146 06/14/16 0620  GLUCAP 108* 92 104* 188* 147*       Studies: Dg Chest Port 1 View  Result Date: 06/13/2016 CLINICAL DATA:  70 year old male with shortness of breath. Subsequent encounter. EXAM: PORTABLE CHEST 1 VIEW COMPARISON:  06/12/2016 FINDINGS: Left central line crosses midline with tip directed superiorly. Tip may be within the right internal jugular vein/innominate vein. Pulmonary vascular congestion.  Findings most notable centrally. Left base consolidation may represent atelectasis, infiltrate and/ or pleural effusion. Follow-up until clearance recommended. Cardiomegaly. Calcified mildly tortuous aorta. Bilateral shoulder joint degenerative changes. IMPRESSION: Left central line crosses midline with tip directed superiorly. Tip may be within the right internal jugular vein/innominate vein. Pulmonary vascular congestion most notable centrally minimally changed from prior exam. Persistent consolidation left base. Cardiomegaly. Calcified mildly tortuous aorta. Electronically Signed   By: Genia Del M.D.   On: 06/13/2016 07:04      No results found for: HGBA1C Lab Results  Component Value Date   CREATININE 1.85 (H) 06/14/2016       Scheduled Meds: . insulin aspart  2-6 Units Subcutaneous TID WC & HS  . piperacillin-tazobactam (ZOSYN)  IV  3.375 g Intravenous Q8H  . sodium chloride flush  3 mL Intravenous Q12H   Continuous Infusions:    LOS: 7 days    Time spent: >30 MINS    [facility]  [facility] Pager [phone]. If 7PM-7AM, please contact night-coverage at [email or web address], password [facility] 06/14/2016, 9:18 AM  LOS: 7 days

## 2016-06-14 NOTE — NC FL2 (Signed)
  [place] LEVEL OF CARE SCREENING TOOL     IDENTIFICATION  Patient Name: [name] Birthdate: 26-Nov-1945 Sex: male Admission Date (Current Location): 06/07/2016  [facility] and [place] Number:  Herbalist and Address:  [facility], [place] [address], [place], [place] [address]      Provider Number: [id number]  Attending Physician Name and Address:  Reyne Dumas, MD  Relative Name and Phone Number:  [name] [phone]    Current Level of Care: Hospital Recommended Level of Care: [place] Prior Approval Number:    Date Approved/Denied: 06/14/16 PASRR Number: [id number]  Discharge Plan: SNF    Current Diagnoses: Patient Active Problem List   Diagnosis Date Noted  . Abdominal fluid collection   . Abdominal pain   . Epigastric pain   . AKI (acute kidney injury) ([place])   . NSVT (nonsustained ventricular tachycardia) ([place])   . Dilated cardiomyopathy ([place])   . Septic shock ([place])   . Pressure injury of skin 06/08/2016  . Acute respiratory failure with hypoxia ([place])   . Endotracheally intubated   . Scrotal mass   . Sepsis ([place])   . 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) Diet (Heart Healthy; thin liquids)  AMBULATORY STATUS COMMUNICATION OF NEEDS Skin   Extensive Assist Verbally PU Stage and Appropriate Care   PU Stage 2 Dressing:  (PRN)                   Personal Care Assistance Level of Assistance  Bathing, Dressing, Feeding Bathing Assistance: Maximum assistance Feeding assistance: Independent Dressing Assistance: Maximum assistance     Functional Limitations Info  Sight, Hearing, Speech Sight Info: Adequate Hearing Info: Adequate      SPECIAL CARE FACTORS FREQUENCY  PT (By licensed PT), OT (By licensed OT)     PT Frequency: min 3x week OT Frequency: min 3x week            Contractures Contractures Info: Not present    Additional Factors Info  Code Status, Allergies Code Status Info: Full Allergies Info: No known allergies           Current Medications (06/14/2016):  This is the current hospital active medication list Current Facility-Administered Medications  Medication Dose Route Frequency Provider Last Rate Last Dose  . 0.9 %  sodium chloride infusion  250 mL Intravenous PRN Smiley Houseman, MD      . insulin aspart (novoLOG) injection 2-6 Units  2-6 Units Subcutaneous TID WC & HS Collene Gobble, MD   2 Units at 06/14/16 [phone]  . piperacillin-tazobactam (ZOSYN) IVPB 3.375 g  3.375 g Intravenous Q8H Raylene Miyamoto, MD   3.375 g at 06/14/16 [id number]  . sodium chloride flush (NS) 0.9 % injection 3 mL  3 mL Intravenous Q12H Smiley Houseman, MD   3 mL at 06/13/16 1148     Discharge Medications: Please see discharge summary for a list of discharge medications.  Relevant Imaging Results:  Relevant Lab Results:   Additional Information SSN: [id number]   Sela Hilding, [place]

## 2016-06-15 ENCOUNTER — Encounter (HOSPITAL_COMMUNITY): Payer: Medicare Other

## 2016-06-15 DIAGNOSIS — R188 Other ascites: Secondary | ICD-10-CM

## 2016-06-15 LAB — COMPREHENSIVE METABOLIC PANEL
ALBUMIN: 2 g/dL — AB (ref 3.5–5.0)
ALK PHOS: 65 U/L (ref 38–126)
ALT: 17 U/L (ref 17–63)
AST: 26 U/L (ref 15–41)
Anion gap: 8 (ref 5–15)
BILIRUBIN TOTAL: 0.5 mg/dL (ref 0.3–1.2)
BUN: 20 mg/dL (ref 6–20)
CALCIUM: 8.2 mg/dL — AB (ref 8.9–10.3)
CO2: 21 mmol/L — AB (ref 22–32)
CREATININE: 1.74 mg/dL — AB (ref 0.61–1.24)
Chloride: 116 mmol/L — ABNORMAL HIGH (ref 101–111)
GFR calc Af Amer: 44 mL/min — ABNORMAL LOW (ref >60)
GFR calc non Af Amer: 38 mL/min — ABNORMAL LOW (ref >60)
GLUCOSE: 93 mg/dL (ref 65–99)
Potassium: 4.1 mmol/L (ref 3.5–5.1)
SODIUM: 145 mmol/L (ref 135–145)
TOTAL PROTEIN: 5.8 g/dL — AB (ref 6.5–8.1)

## 2016-06-15 LAB — CBC
HCT: 25.9 % — ABNORMAL LOW (ref 39.0–52.0)
Hemoglobin: 7.4 g/dL — ABNORMAL LOW (ref 13.0–17.0)
MCH: 22.9 pg — AB (ref 26.0–34.0)
MCHC: 28.6 g/dL — AB (ref 30.0–36.0)
MCV: 80.2 fL (ref 78.0–100.0)
PLATELETS: 99 10*3/uL — AB (ref 150–400)
RBC: 3.23 MIL/uL — ABNORMAL LOW (ref 4.22–5.81)
RDW: 22.3 % — AB (ref 11.5–15.5)
WBC: 5.7 10*3/uL (ref 4.0–10.5)

## 2016-06-15 LAB — GLUCOSE, CAPILLARY
GLUCOSE-CAPILLARY: 110 mg/dL — AB (ref 65–99)
Glucose-Capillary: 94 mg/dL (ref 65–99)

## 2016-06-15 MED ORDER — FUROSEMIDE 40 MG PO TABS: 40.0000 mg | ORAL_TABLET | Freq: Every day | ORAL | 0 refills | Status: DC | PRN

## 2016-06-15 MED ORDER — CARVEDILOL 3.125 MG PO TABS: 3.1250 mg | ORAL_TABLET | Freq: Two times a day (BID) | ORAL | 1 refills | Status: DC

## 2016-06-15 NOTE — Care Management Note (Signed)
Case Management Note Marvetta Gibbons RN, BSN Unit 2W-Case Manager [phone]  Patient Details  Name: [name] MRN: [id number] Date of Birth: 1946-05-05  Subjective/Objective:  Pt   Admitted with acute kyperkalemia                Action/Plan: PTA pt lived at home alone- PT recommendations for STSNF- however pt has refused - family to take pt home and sister and daughter will help arrange supervision. HH orders have been placed along with order for RW- call made to [facility] with [facility] for DME need- RW to be delivered to room prior to discharge- spoke with family (sister) on arrival to room regarding choice for [facility] agency- list provided for [facility]- they would like to use [facility] per choice- referral called to Santiago Glad with [facility] for HHRN/PT.   Expected Discharge Date: 06/15/16             Expected Discharge Plan:  [place]  In-House Referral:     Discharge planning Services  CM Consult  Post Acute Care Choice:  Home Health Choice offered to:  Sibling  DME Arranged:  [place] [facility] D[facility] Agency:  [place] Arranged:  RN, PT [facility] Agency:  [place]  Status of Service:  Completed, signed off  If discussed at [place] of Stay Meetings, dates discussed:    Additional Comments:  Dawayne Patricia, RN 06/15/2016, 11:11 PM

## 2016-06-15 NOTE — Discharge Summary (Addendum)
Physician Discharge Summary  [name] [id number] DOB: 05-03-1946 DOA: 06/07/2016  PCP: No primary care provider on file.  Admit date: 06/07/2016 Discharge date: 06/15/2016  Admitted From: home Disposition:  home  Recommendations for Outpatient Follow-up:  1. Follow up with PCP in 1-2 weeks 2. Please obtain BMP/CBC in one week 3. Follow up with Cardiology as scheduled   Home Health: PT, RN Equipment/Devices: none  Discharge Condition: stable, note that SNF was recommended but patient refused CODE STATUS: Full Diet recommendation: heart healthy  HPI: 70 year old male with PMH as below which is significant for CHF, CKD III, COPD, diverticulitis with perforation and now ostomy since 2009, and arthritis. Per family he has been feeling unwell for probably about a week, but notably on 10/22 he started getting more lethargic. No specific complaints, just "didn't feel well". 10/24 when daughter got to his house he was fairly lethargic so she called EMS and he was brought to the ED where laboratory evaluation significant for K >7.5, CO2 8, Creatinine 11.31. He was temporized and K fell to 6.9. He was seen by nephrology who recommended bicarb and kayexalate and potentially HD if not improving. New fungating scrotal/testicular mass identified in ED.    For his profound   hyperkalemia he was given Kayexelate and bicarb drip/nephrology consulted. Also found to have a possible intra-abdominal abscess and was started on Zosyn on admit.CT abdomen and pelvis demonstrated possible intraabdominal abscess. Developed hypotension/shock requiring Neo-synephrine likely septic shock. Ectopy with nonsustained Vtach possiblydue to sepsis initially required amiodarone, cardiology consulted, off amiodarone as per cards (10/29).  Was extubated on 10/29 AM. Levophed was stopped at 2040 on 10/30. Vasopressin stopped 1500 10/29. MAPs have been in the upper 60s mainly with intermittent low 60s. Transfer to  [facility] 10/31  Hospital Course: Discharge Diagnoses:  Active Problems:   Acute hyperkalemia   Pressure injury of skin   Acute respiratory failure with hypoxia (HCC)   Endotracheally intubated   Scrotal mass   Sepsis (HCC)   AKI (acute kidney injury) ([place])   NSVT (nonsustained ventricular tachycardia) (HCC)   Dilated cardiomyopathy (HCC)   Septic shock (HCC)   Epigastric pain   Abdominal fluid collection   Abdominal pain   Ventricular tachycardia - In the setting of shock and cardiomyopathy. VT improved with correction of metabolic abnormalities and IV Amiodarone. Now predominantly in sinus rhythm. IV amiodarone was stopped 10/30 per cardiology. He was started on Coreg per cardiology, remained stable, d/w Dr. Harrington Challenger and will d/c home on Coreg. 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 He would require surgical excision. Recommended to follow-up as an outpatient following his acute illness for further evaluation. General surgery evaluated patient and reviewed imaging, did not feel like fluid collection is an abscess but fluid filled bowel. Patient has been off antibiotics, afebrile, WBC normal.  Bilateral renal masses - combination of simple and complex renal cyst. However, definitive enhancement is not able to be determined in the absence of IV contrast. He cannot receive contrast currently considering his acute renal failure. Evaluation with a renal ultrasound or noncontrast MRI may be helpful if he cannot receive intravenous contrast due to renal function. Ischemic cardiomyopathy -reduced EF of 20-25%, akinesis of the basal-midanteroseptal and inferoseptal myocardium. There isakinesis of the entire inferior myocardium. Cardiac cath in 2006 showed normal coronary anatomy with low EF. Not a cath candidate at this time. Not a candidate for  ACEi, at this time. Added coreg as above,  tolerating it well. He has close outpatient follow up with cardiology.  Acute kidney injury on CKD III/Hyperkalemia >acute episode likely due to ACE-I, dehydration, lasix: creatinine improving. (unknown baseline). Hold nephrotoxic agents. Will need repeat BMP in office. K normal on discharge Respiratory failure-intubated for airway protection, extubated 10/29, had COPD without acute exacerbation, resolved Thrombocytopenia - HIT panel sent, negative, right upper extremity swelling, no DVT. Superficial thrombosis  right cephalic vein from [facility] to axilla level, Heparin discontinued 10/30, platelet count is stable. Will need repeat CBC as an outpatient Anemia of chronic disease-stable, no active bleeding   Discharge Instructions     Medication List    STOP taking these medications   amLODipine 10 MG tablet Commonly known as:  NORVASC   HYDROcodone-acetaminophen 5-325 MG tablet Commonly known as:  NORCO/VICODIN   lisinopril 20 MG tablet Commonly known as:  PRINIVIL,ZESTRIL   potassium chloride 20 MEQ packet Commonly known as:  KLOR-CON   sildenafil 50 MG tablet Commonly known as:  VIAGRA     TAKE these medications   albuterol (2.5 MG/3ML) 0.083% nebulizer solution Commonly known as:  PROVENTIL Take 2.5 mg by nebulization every 6 (six) hours as needed for wheezing or shortness of breath.   albuterol-ipratropium 18-103 MCG/ACT inhaler Commonly known as:  COMBIVENT Inhale 1 puff into the lungs 2 (two) times daily.   allopurinol 100 MG tablet Commonly known as:  ZYLOPRIM Take 100 mg by mouth daily.   budesonide-formoterol 160-4.5 MCG/ACT inhaler Commonly known as:  SYMBICORT Inhale 2 puffs into the lungs 2 (two) times daily.   carvedilol 3.125 MG tablet Commonly known as:  COREG Take 1 tablet (3.125 mg total) by mouth 2 (two) times daily with a meal. What changed:  medication strength  how much to take   diclofenac sodium 1 % Gel Commonly known as:  VOLTAREN Apply 2 g  topically 4 (four) times daily as needed (pain).   ferrous sulfate 325 (65 FE) MG tablet Take 325 mg by mouth 3 (three) times daily with meals.   furosemide 40 MG tablet Commonly known as:  LASIX Take 1 tablet (40 mg total) by mouth daily as needed. What changed:  when to take this  reasons to take this   nystatin powder Commonly known as:  MYCOSTATIN/NYSTOP Apply topically 3 (three) times daily. To affected area   traMADol 50 MG tablet Commonly known as:  ULTRAM Take 50 mg by mouth 2 (two) times daily as needed for moderate pain or severe pain.      Follow-up Information    BORDEN,LES, MD. Schedule an appointment as soon as possible for a visit in 1 week(s).   Specialty:  Urology Contact information: [place] [address] [phone]        Richardson Dopp, PA-C .   Specialties:  Cardiology, Physician Assistant Why:  on 11/20 as scheduled Contact information: [address] [place] [address] [phone]          No Known Allergies  Consultations:  Cardiology   PCCM  Procedures/Studies:  2D echo Study Conclusions - Left ventricle: The cavity size was severely dilated. There was moderate concentric hypertrophy. Systolic function was severely reduced. The estimated ejection fraction was in the range of 20% to 25%. Diffuse hypokinesis. There is akinesis of the basal-midanteroseptal and inferoseptal myocardium. There is akinesis of the entireinferior myocardium. Features are consistent with a pseudonormal left ventricular filling pattern, with  concomitant abnormal relaxation and increased filling pressure (grade 2 diastolic dysfunction). Doppler parameters are consistent with high ventricular filling pressure. - Left atrium: The atrium was severely dilated.  Ct Abdomen Pelvis Wo Contrast  Result Date: 06/07/2016 CLINICAL DATA:  Abdominal pain. Scrotal swelling. Evaluate for scrotal mass. EXAM: CT ABDOMEN AND PELVIS WITHOUT  CONTRAST TECHNIQUE: Multidetector CT imaging of the abdomen and pelvis was performed following the standard protocol without IV contrast. COMPARISON:  02/11/2008 FINDINGS: Lower chest: Lung bases are clear. No effusions. Heart is normal size. - Hepatobiliary: No focal hepatic abnormality. Gallbladder unremarkable. Pancreas: No focal abnormality or ductal dilatation. Spleen: No focal abnormality.  Normal size. Adrenals/Urinary Tract: Multiple hyperdense and hypodense lesions within the kidneys bilaterally, likely a combination of simple and complex/ hemorrhagic cysts. The largest is in the right no pole measuring 5.6 cm. This previously measured 3.3 cm. Cannot completely exclude that some of these could represent solid renal lesions. No hydronephrosis. 2.1 cm left adrenal nodule compared to 1.8 cm previously. Right adrenal gland and urinary bladder grossly unremarkable. Stomach/Bowel: Postoperative changes noted within the colon. Right lower quadrant ostomy present. There is Collie Siad is an layering fluid collection noted in the pelvis with surrounding suture lines. This presumably represents a dilated denervation segment of distal bowel, but is difficult to exclude separate pelvic abscess. This measures up to 7.6 cm. Small bowel and stomach decompressed. Vascular/Lymphatic: Diffuse iliac and aortic calcifications. No aneurysm. No adenopathy. Reproductive: Very large bilateral hydroceles, left larger than right. Other: No free fluid or free air. Musculoskeletal: No acute bony abnormality or focal bone lesion. IMPRESSION: Changes of prior bowel surgery with right lower quadrant ostomy. There is a gas and fluid collection in the pelvis measuring 7.6 cm with surrounding suture. This is intimately associated with the sigmoid colon remnant and may represent a segment of these are patent dilated bowel, but I cannot completely exclude that this is an abscess adjacent to the Hartmann's pouch. Mixture of low-density and  high-density lesions within the kidneys bilaterally, enlarging since prior study. The largest cyst in the midpole measuring 5.6 cm compared to 3.3 cm previously. While these may reflect a combination of simple and hemorrhagic/complex cysts, I cannot exclude solid renal lesions/ neoplasms. Recommend further evaluation with contrast-enhanced CT or MRI. Left adrenal nodule, slightly larger than prior study, nonspecific. Aortoiliac atherosclerosis. Large bilateral hydroceles, left greater than right. Electronically Signed   By: Rolm Baptise M.D.   On: 06/07/2016 16:31   Dg Chest Port 1 View  Result Date: 06/13/2016 CLINICAL DATA:  70 year old male with shortness of breath. Subsequent encounter. EXAM: PORTABLE CHEST 1 VIEW COMPARISON:  06/12/2016 FINDINGS: Left central line crosses midline with tip directed superiorly. Tip may be within the right internal jugular vein/innominate vein. Pulmonary vascular congestion.  Findings most notable centrally. Left base consolidation may represent atelectasis, infiltrate and/ or pleural effusion. Follow-up until clearance recommended. Cardiomegaly. Calcified mildly tortuous aorta. Bilateral shoulder joint degenerative changes. IMPRESSION: Left central line crosses midline with tip directed superiorly. Tip may be within the right internal jugular vein/innominate vein. Pulmonary vascular congestion most notable centrally minimally changed from prior exam. Persistent consolidation left base. Cardiomegaly. Calcified mildly tortuous aorta. Electronically Signed   By: Genia Del M.D.   On: 06/13/2016 07:04   Dg Chest Port 1 View  Result Date: 06/12/2016 CLINICAL DATA:  Intubated. EXAM: PORTABLE CHEST 1 VIEW COMPARISON:  Yesterday. FINDINGS: The patient's chin is obscuring the superior aspect of the right lung. Endotracheal tube in satisfactory position.  Nasogastric tube extending into the stomach with its tip in the proximal stomach. The left jugular catheter tip remains in  the region of the origin of the right innominate vein. Stable enlarged cardiac silhouette. Dense left lower lobe airspace opacity. Aortic arch calcifications. Bilateral shoulder degenerative changes. IMPRESSION: 1. Dense left lower lobe atelectasis or pneumonia. 2. The left jugular catheter tip remains in the region of the origin of the right innominate vein. 3. Stable cardiomegaly. Electronically Signed   By: Claudie Revering M.D.   On: 06/12/2016 09:02   Dg Chest Port 1 View  Result Date: 06/11/2016 CLINICAL DATA:  Endotracheal tube placement EXAM: PORTABLE CHEST 1 VIEW COMPARISON:  06/10/2016 FINDINGS: Endotracheal tube with the tip 2.7 cm above the carina. Nasogastric tube coursing below the diaphragm projecting over the stomach. Left IJ central venous catheter with the tip located at the confluence of the right IJ - brachycephalic pain and directed cranially ; recommend repositioning prior to use. Mild bilateral interstitial prominence. No focal consolidation, pleural effusion or pneumothorax. Stable cardiomegaly. Moderate osteoarthritis of bilateral glenohumeral joints. IMPRESSION: 1. Endotracheal tube with the tip 2.7 cm above the carina. 2. Left IJ central venous catheter with the tip located at the confluence of the right IJ - brachycephalic pain and directed cranially ; recommend repositioning prior to use. Electronically Signed   By: Kathreen Devoid   On: 06/11/2016 08:59   Dg Chest Port 1 View  Result Date: 06/10/2016 CLINICAL DATA:  Endotracheal tube EXAM: PORTABLE CHEST 1 VIEW COMPARISON:  Yesterday FINDINGS: Endotracheal tube tip between the clavicular heads and carina. Left IJ central line crosses into the right brachiocephalic vein, unchanged. Nasogastric tube reaches the stomach. Worsening left basilar aeration with obscured diaphragm. No pneumothorax or edema. IMPRESSION: 1. Unchanged positioning of tubes and left IJ line with tip at the right brachiocephalic vein. 2. Interval worsening  retrocardiac aeration, likely atelectasis. Electronically Signed   By: Monte Fantasia M.D.   On: 06/10/2016 08:44   Dg Chest Port 1 View  Result Date: 06/09/2016 CLINICAL DATA:  History of endotracheal tube. Hypertension. Arthritis. COPD. EXAM: PORTABLE CHEST 1 VIEW COMPARISON:  1 a prior FINDINGS: Left internal jugular line is again malpositioned, with tip directed cephalad, with tip in the right brachiocephalic vein. This is unchanged. Endotracheal tube terminates 4.1 cm above carina.Nasogastric tube extends beyond the inferior aspect of the film. Numerous leads and wires project over the chest. Cardiomegaly accentuated by AP portable technique. Probable small left pleural effusion. No pneumothorax. Mild pulmonary venous congestion. Left base airspace disease persists, improved. IMPRESSION: Cardiomegaly with mild pulmonary venous congestion. Probable small left pleural effusion with improved adjacent atelectasis. Malpositioned left internal jugular line, as detailed on prior exams. Electronically Signed   By: Abigail Miyamoto M.D.   On: 06/09/2016 07:26   Dg Chest Port 1 View  Result Date: 06/08/2016 CLINICAL DATA:  Central line placement EXAM: PORTABLE CHEST 1 VIEW COMPARISON:  06/08/2016 FINDINGS: Stable endotracheal and NG tube position. Again noted left IJ central line going cephalad in right brachiocephalic vein. No pneumothorax. IMPRESSION: Again noted left IJ central line with tip going cephalad in right brachiocephalic vein. No pneumothorax. These results were called by telephone at the time of interpretation on 06/08/2016 at 3:01 pm to patient's ICU nurse Kieth Brightly, who verbally acknowledged these results. Electronically Signed   By: Lahoma Crocker M.D.   On: 06/08/2016 15:02   Dg Chest Port 1 View  Result Date: 06/08/2016 CLINICAL DATA:  Endotracheal tube placement. EXAM:  PORTABLE CHEST 1 VIEW COMPARISON:  06/07/2016 FINDINGS: New endotracheal tube tip projects 2.9 cm above the Carina.  Nasal/orogastric tube passes to the level of the medial left hemidiaphragm, below the included field of view, likely in the stomach. Left internal jugular central venous line has its tip extending towards the right lung apex, consistent with it extending upward into the right brachiocephalic vein. No pneumothorax.  Lungs are clear. IMPRESSION: 1. Left internal jugular central venous line catheter is malpositioned positioned, with its tip projecting within the right brachiocephalic vein are central right internal jugular vein. 2. Well-positioned endotracheal tube. 3. Nasal/orogastric tube passes below the included field of view likely within the stomach. 4. Lungs remain clear. 5. No pneumothorax. Electronically Signed   By: Lajean Manes M.D.   On: 06/08/2016 12:04   Dg Chest Port 1 View  Result Date: 06/07/2016 CLINICAL DATA:  70 year old male with shortness breath and weakness for 2 days. Initial encounter. EXAM: PORTABLE CHEST 1 VIEW COMPARISON:  03/21/2005. FINDINGS: Portable exam with multiple overlying leads. No infiltrate, congestive heart failure or pneumothorax. No plain film evidence of pulmonary malignancy. Heart size within normal limits. Calcified slightly tortuous aorta. Bilateral shoulder joint degenerative changes. IMPRESSION: No acute abnormality. Aortic atherosclerosis. Electronically Signed   By: Genia Del M.D.   On: 06/07/2016 13:53      Subjective: - no chest pain, shortness of breath, no abdominal pain, nausea or vomiting. Wants to go home this morning  Discharge Exam: Vitals:   06/15/16 0831 06/15/16 1228  BP: 116/61   Pulse: 75 (!) 117  Resp: (!) 21   Temp: (!) 96.8 F (36 C)    Vitals:   06/14/16 1929 06/15/16 0524 06/15/16 0831 06/15/16 1228  BP: 115/67 105/66 116/61   Pulse: 64 81 75 (!) 117  Resp: 18 20 (!) 21   Temp: 97.8 F (36.6 C) 98.6 F (37 C) (!) 96.8 F (36 C)   TempSrc: Oral Oral Oral   SpO2: 90% 99% 98% 90%  Weight:  82.4 kg (181 lb 11.2 oz)     Height:        General: Pt is alert, awake, not in acute distress Cardiovascular: RRR, S1/S2 +, no rubs, no gallops Respiratory: CTA bilaterally, no wheezing, no rhonchi Abdominal: Soft, NT, ND, bowel sounds + Extremities: no edema, no cyanosis    The results of significant diagnostics from this hospitalization (including imaging, microbiology, ancillary and laboratory) are listed below for reference.     Microbiology: Recent Results (from the past 240 hour(s))  Blood culture (routine x 2)     Status: None   Collection Time: 06/07/16  1:20 PM  Result Value Ref Range Status   Specimen Description BLOOD RIGHT ANTECUBITAL  Final   Special Requests BOTTLES DRAWN AEROBIC AND ANAEROBIC  5CC  Final   Culture NO GROWTH 5 DAYS  Final   Report Status 06/12/2016 FINAL  Final  Blood culture (routine x 2)     Status: None   Collection Time: 06/07/16  3:24 PM  Result Value Ref Range Status   Specimen Description BLOOD RIGHT HAND  Final   Special Requests IN PEDIATRIC BOTTLE 2CC  Final   Culture NO GROWTH 5 DAYS  Final   Report Status 06/12/2016 FINAL  Final  MRSA PCR Screening     Status: None   Collection Time: 06/07/16  8:33 PM  Result Value Ref Range Status   MRSA by PCR NEGATIVE NEGATIVE Final    Comment:  The GeneXpert MRSA Assay (FDA approved for NASAL specimens only), is one component of a comprehensive MRSA colonization surveillance program. It is not intended to diagnose MRSA infection nor to guide or monitor treatment for MRSA infections.   Urine culture     Status: None   Collection Time: 06/07/16  8:50 PM  Result Value Ref Range Status   Specimen Description URINE, CATHETERIZED  Final   Special Requests NONE  Final   Culture NO GROWTH  Final   Report Status 06/09/2016 FINAL  Final     Labs: BNP (last 3 results) No results for input(s): BNP in the last 8760 hours. Basic Metabolic Panel:  Recent Labs Lab 06/10/16 0734 06/11/16 0347 06/11/16 2239  06/12/16 0612 06/13/16 0529 06/14/16 0223 06/15/16 0309  NA  --  145 142 142 145 146* 145  K  --  3.6 3.9 3.9 3.9 3.8 4.1  CL  --  114* 113* 115* 115* 117* 116*  CO2  --  25 23 23 26 22  21*  GLUCOSE  --  133* 124* 158* 93 108* 93  BUN  --  40* 34* 32* 26* 25* 20  CREATININE  --  2.55* 2.01* 1.93* 1.75* 1.85* 1.74*  CALCIUM  --  7.8* 7.7* 7.9* 8.1* 8.2* 8.2*  MG 1.6* 1.6* 2.1 1.9 1.9 1.9  --   PHOS 2.8 1.6*  --  1.4* 2.5 1.5*  --    Liver Function Tests:  Recent Labs Lab 06/09/16 0223 06/14/16 0223 06/15/16 0309  AST 15  --  26  ALT 17  --  17  ALKPHOS 67  --  65  BILITOT 0.7  --  0.5  PROT 6.3*  --  5.8*  ALBUMIN 2.3* 2.1* 2.0*   No results for input(s): LIPASE, AMYLASE in the last 168 hours. No results for input(s): AMMONIA in the last 168 hours. CBC:  Recent Labs Lab 06/11/16 0347 06/12/16 0612 06/13/16 0529 06/14/16 0223 06/15/16 0309  WBC 6.7 7.1 5.2 6.9 5.7  HGB 7.8* 7.3* 7.0* 7.9* 7.4*  HCT 26.1* 24.8* 24.2* 27.8* 25.9*  MCV 75.9* 75.2* 77.1* 79.9 80.2  PLT 111* 95* 83* 112* 99*   Cardiac Enzymes: No results for input(s): CKTOTAL, CKMB, CKMBINDEX, TROPONINI in the last 168 hours. BNP: Invalid input(s): POCBNP CBG:  Recent Labs Lab 06/14/16 1155 06/14/16 1615 06/14/16 2150 06/15/16 0635 06/15/16 1129  GLUCAP 122* 60* 122* 94 110*   D-Dimer No results for input(s): DDIMER in the last 72 hours. Hgb A1c No results for input(s): HGBA1C in the last 72 hours. Lipid Profile No results for input(s): CHOL, HDL, LDLCALC, TRIG, CHOLHDL, LDLDIRECT in the last 72 hours. Thyroid function studies No results for input(s): TSH, T4TOTAL, T3FREE, THYROIDAB in the last 72 hours.  Invalid input(s): FREET3 Anemia work up No results for input(s): VITAMINB12, FOLATE, FERRITIN, TIBC, IRON, RETICCTPCT in the last 72 hours. Urinalysis No results found for: COLORURINE, APPEARANCEUR, [place], [place], [place], [place], [place], [place], PROTEINUR, UROBILINOGEN,  NITRITE, LEUKOCYTESUR Sepsis Labs Invalid input(s): PROCALCITONIN,  WBC,  LACTICIDVEN Microbiology Recent Results (from the past 240 hour(s))  Blood culture (routine x 2)     Status: None   Collection Time: 06/07/16  1:20 PM  Result Value Ref Range Status   Specimen Description BLOOD RIGHT ANTECUBITAL  Final   Special Requests BOTTLES DRAWN AEROBIC AND ANAEROBIC  5CC  Final   Culture NO GROWTH 5 DAYS  Final   Report Status 06/12/2016 FINAL  Final  Blood culture (routine x  2)     Status: None   Collection Time: 06/07/16  3:24 PM  Result Value Ref Range Status   Specimen Description BLOOD RIGHT HAND  Final   Special Requests IN PEDIATRIC BOTTLE 2CC  Final   Culture NO GROWTH 5 DAYS  Final   Report Status 06/12/2016 FINAL  Final  MRSA PCR Screening     Status: None   Collection Time: 06/07/16  8:33 PM  Result Value Ref Range Status   MRSA by PCR NEGATIVE NEGATIVE Final    Comment:        The GeneXpert MRSA Assay (FDA approved for NASAL specimens only), is one component of a comprehensive MRSA colonization surveillance program. It is not intended to diagnose MRSA infection nor to guide or monitor treatment for MRSA infections.   Urine culture     Status: None   Collection Time: 06/07/16  8:50 PM  Result Value Ref Range Status   Specimen Description URINE, CATHETERIZED  Final   Special Requests NONE  Final   Culture NO GROWTH  Final   Report Status 06/09/2016 FINAL  Final     Time coordinating discharge: Over 30 minutes  SIGNED:  Marzetta Board, MD  [facility] 06/15/2016, 3:34 PM Pager [phone]  If 7PM-7AM, please contact night-coverage [email or web address] Password [email or web address]

## 2016-06-15 NOTE — Discharge Instructions (Signed)
Follow with PCP in 5-7 days  Please get a complete blood count and chemistry panel checked by your Primary MD at your next visit, and again as instructed by your Primary MD. Please get your medications reviewed and adjusted by your Primary MD.  Please request your Primary MD to go over all Hospital Tests and Procedure/Radiological results at the follow up, please get all Hospital records sent to your Prim MD by signing hospital release before you go home.  If you had Pneumonia of Lung problems at the Hospital: Please get a 2 view Chest X ray done in 6-8 weeks after hospital discharge or sooner if instructed by your Primary MD.  If you have Congestive Heart Failure: Please call your Cardiologist or Primary MD anytime you have any of the following symptoms:  1) 3 pound weight gain in 24 hours or 5 pounds in 1 week  2) shortness of breath, with or without a dry hacking cough  3) swelling in the hands, feet or stomach  4) if you have to sleep on extra pillows at night in order to breathe  Follow cardiac low salt diet and 1.5 lit/day fluid restriction.  If you have diabetes Accuchecks 4 times/day, Once in AM empty stomach and then before each meal. Log in all results and show them to your primary doctor at your next visit. If any glucose reading is under 80 or above 300 call your primary MD immediately.  If you have Seizure/Convulsions/Epilepsy: Please do not drive, operate heavy machinery, participate in activities at heights or participate in high speed sports until you have seen by Primary MD or a Neurologist and advised to do so again.  If you had Gastrointestinal Bleeding: Please ask your Primary MD to check a complete blood count within one week of discharge or at your next visit. Your endoscopic/colonoscopic biopsies that are pending at the time of discharge, will also need to followed by your Primary MD.  Get Medicines reviewed and adjusted. Please take all your medications with you  for your next visit with your Primary MD  Please request your Primary MD to go over all hospital tests and procedure/radiological results at the follow up, please ask your Primary MD to get all Hospital records sent to his/her office.  If you experience worsening of your admission symptoms, develop shortness of breath, life threatening emergency, suicidal or homicidal thoughts you must seek medical attention immediately by calling 911 or calling your MD immediately  if symptoms less severe.  You must read complete instructions/literature along with all the possible adverse reactions/side effects for all the Medicines you take and that have been prescribed to you. Take any new Medicines after you have completely understood and accpet all the possible adverse reactions/side effects.   Do not drive or operate heavy machinery when taking Pain medications.   Do not take more than prescribed Pain, Sleep and Anxiety Medications  Special Instructions: If you have smoked or chewed Tobacco  in the last 2 yrs please stop smoking, stop any regular Alcohol  and or any Recreational drug use.  Wear Seat belts while driving.  Please note You were cared for by a hospitalist during your hospital stay. If you have any questions about your discharge medications or the care you received while you were in the hospital after you are discharged, you can call the unit and asked to speak with the hospitalist on call if the hospitalist that took care of you is not available. Once you are  discharged, your primary care physician will handle any further medical issues. Please note that NO REFILLS for any discharge medications will be authorized once you are discharged, as it is imperative that you return to your primary care physician (or establish a relationship with a primary care physician if you do not have one) for your aftercare needs so that they can reassess your need for medications and monitor your lab values.  You  can reach the hospitalist office at phone [phone] or fax [phone]   If you do not have a primary care physician, you can call [phone] for a physician referral.  Activity: As tolerated with Full fall precautions use walker/cane & assistance as needed  Diet: heart healthy  Disposition Home

## 2016-06-15 NOTE — Evaluation (Signed)
Occupational Therapy Evaluation Patient Details Name: [name] MRN: [id number] DOB: 12-12-1945 Today's Date: 06/15/2016    History of Present Illness 70 yo admitted with hyperkalemia10/24 when daughter got to his house he was fairly lethargic so called EMS, since admission found to have testicular mass and possible intraabdominal abscess. PMHx: CHF, CKD, COPD, diverticulitis, ostomy   Clinical Impression   Pt admitted with above.  He demonstrates impaired balance, decreased endurance, impaired balance.  He requires min guard assisst - min A for ADLs.  He is at high risk for falls, and is not safe to manage medications safely on his own.  He will need 24 hour supervision/assist at discharge.  Per CM, pt is to discharge home today.  Will defer further OT to [facility].        Follow Up Recommendations  SNF;Supervision/Assistance - 24 hour (refusing - needs HHOT)   Equipment Recommendations  None recommended by OT    Recommendations for Other Services       Precautions / Restrictions Precautions Precautions: Fall Restrictions Weight Bearing Restrictions: No      Mobility Bed Mobility               General bed mobility comments: Pt sitting on EOB   Transfers Overall transfer level: Needs assistance Equipment used: Rolling walker (2 wheeled) Transfers: Sit to/from [facility] Sit to Stand: Min guard Stand pivot transfers: Min guard       General transfer comment: min guard to steady     Balance Overall balance assessment: Needs assistance Sitting-balance support: Feet supported Sitting balance-Leahy Scale: Good     Standing balance support: Single extremity supported;During functional activity Standing balance-Leahy Scale: Poor                              ADL Overall ADL's : Needs assistance/impaired Eating/Feeding: Independent   Grooming: Wash/dry hands;Wash/dry face;Oral care;Brushing hair;Min guard;Standing   Upper  Body Bathing: Supervision/ safety;Sitting   Lower Body Bathing: Min guard;Sit to/from stand   Upper Body Dressing : Supervision/safety;Sitting   Lower Body Dressing: Minimal assistance;Sit to/from stand   Toilet Transfer: Min guard;Ambulation;Comfort height toilet;Grab bars;RW   Toileting- Clothing Manipulation and Hygiene: Sit to/from stand;Min guard       Functional mobility during ADLs: Herbalist     Praxis      Pertinent Vitals/Pain Pain Assessment: No/denies pain     Hand Dominance Right   Extremity/Trunk Assessment Upper Extremity Assessment Upper Extremity Assessment: Generalized weakness (grossly 3+/5 - 4-/5)   Lower Extremity Assessment Lower Extremity Assessment: Defer to PT evaluation   Cervical / Trunk Assessment Cervical / Trunk Assessment: Normal   Communication Communication Communication: No difficulties   Cognition Arousal/Alertness: Awake/alert Behavior During Therapy: WFL for tasks assessed/performed Overall Cognitive Status: Impaired/Different from baseline Area of Impairment: Attention;Safety/judgement;Awareness;Problem solving;Orientation Orientation Level: Disoriented to;Situation Current Attention Level: Selective Memory: Decreased short-term memory Following Commands: Follows one step commands consistently Safety/Judgement: Decreased awareness of safety;Decreased awareness of deficits Awareness: Intellectual   General Comments: Pt is insistent that his daughters will be providing 24 hour assist at discharge, but in conversation with SW, daughter will NOT be with pt during the day, and is not able to provide level of care he needs    General Comments       Exercises       Shoulder Instructions  Home Living Family/patient expects to be discharged to:: Private residence Living Arrangements: Alone;Children Available Help at Discharge: Family;Available PRN/intermittently Type of  Home: House Home Access: Stairs to enter Entrance Stairs-Number of Steps: 3-4   Home Layout: Two level;Able to live on main level with bedroom/bathroom     Bathroom Shower/Tub: Occupational psychologist: Handicapped height Bathroom Accessibility: Yes How Accessible: Accessible via walker Home Equipment: [place] - single point;Shower seat;Grab bars - tub/shower   Additional Comments: Definitive discrepencies noted between info provided during PT eval.  No family present to provide info/clarification       Prior Functioning/Environment Level of Independence: Independent        Comments: pt reports he could care for himself with all housework and cooking but daughter takes care of his bills and grocery shopping.         OT Problem List: Decreased strength;Decreased activity tolerance;Impaired balance (sitting and/or standing);Decreased cognition;Decreased safety awareness;Decreased knowledge of use of DME or AE;Cardiopulmonary status limiting activity   OT Treatment/Interventions: Self-care/ADL training;Energy conservation;DME and/or AE instruction;Therapeutic activities;Cognitive remediation/compensation;Patient/family education;Balance training    OT Goals(Current goals can be found in the care plan section) Acute Rehab OT Goals Patient Stated Goal: return home OT Goal Formulation: With patient Time For Goal Achievement: 06/29/16 Potential to Achieve Goals: Good  OT Frequency: Min 2X/week   Barriers to D/C: Decreased caregiver support          Co-evaluation              End of Session Equipment Utilized During Treatment: Rolling walker;Gait belt Nurse Communication: Mobility status  Activity Tolerance: Patient limited by fatigue Patient left: Other (comment) (with PT )   Time: [id number] OT Time Calculation (min): 12 min Charges:  OT General Charges $OT Visit: 1 Procedure OT Evaluation $OT Eval Moderate Complexity: 1 Procedure G-Codes:    [name],  [name] M 01-Jul-2016, 12:13 PM

## 2016-06-15 NOTE — Progress Notes (Signed)
Physical Therapy Treatment Patient Details Name: [name] MRN: [id number] DOB: 1945-09-22 Today's Date: 06/15/2016    History of Present Illness 70 yo admitted with hyperkalemia10/24 when daughter got to his house he was fairly lethargic so called EMS, since admission found to have testicular mass and possible intraabdominal abscess. VDRF 10/25-10/29. PMHx: CHF, CKD, COPD, diverticulitis, ostomy    PT Comments    Pt with great activity progression since evaluation and able to walk and stand with only one person assist. Pt continues to demonstrate cognition and balance deficits who is unsafe to be home alone and no family has been present to confirm the availability of assist at home. Pt continues to require education and assist for transfers and gait. Will continue to follow. Pt noticeably fatigued with gait and stairs and unable to mobilize further.   sats 90-93% on RA with activity, HR 117   Follow Up Recommendations  Supervision/Assistance - 24 hour;SNF     Equipment Recommendations  Rolling walker with 5" wheels    Recommendations for Other Services       Precautions / Restrictions Precautions Precautions: Fall    Mobility  Bed Mobility               General bed mobility comments: pt standing with OT on arrival  Transfers Overall transfer level: Needs assistance Equipment used: Rolling walker (2 wheeled) Transfers: Sit to/from Stand Sit to Stand: Min assist Stand pivot transfers: Min guard       General transfer comment: min guard to stand for safety, min to sit due to lack of control to surface despite cues x 2 trials  Ambulation/Gait Ambulation/Gait assistance: Min assist Ambulation Distance (Feet): 150 Feet Assistive device: Rolling walker (2 wheeled) Gait Pattern/deviations: Step-through pattern;Decreased stride length;Trunk flexed   Gait velocity interpretation: Below normal speed for age/gender General Gait Details: cues for posture,  position in RW and directional cues- pt without awareness of what direction his room is. Was walking with OT prior to arrival   Stairs Stairs: Yes Stairs assistance: Min guard Stair Management: Two rails;Step to pattern;Forwards Number of Stairs: 3 General stair comments: pt required bil rails for balance to accomplish stairs  Wheelchair Mobility    Modified Rankin (Stroke Patients Only)       Balance Overall balance assessment: Needs assistance Sitting-balance support: Feet supported Sitting balance-Leahy Scale: Good     Standing balance support: Single extremity supported;During functional activity Standing balance-Leahy Scale: Poor                      Cognition Arousal/Alertness: Awake/alert Behavior During Therapy: WFL for tasks assessed/performed Overall Cognitive Status: Impaired/Different from baseline Area of Impairment: Attention;Safety/judgement;Problem solving;Orientation Orientation Level: Disoriented to;Situation;Time Current Attention Level: Selective Memory: Decreased short-term memory Following Commands: Follows one step commands consistently Safety/Judgement: Decreased awareness of safety;Decreased awareness of deficits Awareness: Intellectual   General Comments: Pt continues to provide inconsistent stories of family assist as he states daughters and nephew can assist but none have confirmed. Pt states he manages his own medications but cannot state what they are. Unaware of why he is in the hospital    Exercises General Exercises - Lower Extremity Long Arc Quad: AROM;Both;10 reps;Seated Hip Flexion/Marching: AROM;Both;10 reps;Seated    General Comments      Pertinent Vitals/Pain Pain Assessment: No/denies pain           PT Goals (current goals can now be found in the care plan section) Acute Rehab PT Goals  Patient Stated Goal: return home Progress towards PT goals: Goals met and updated - see care plan    Frequency            PT Plan Current plan remains appropriate    Co-evaluation             End of Session Equipment Utilized During Treatment: Gait belt Activity Tolerance: Patient tolerated treatment well Patient left: in chair;with call bell/phone within reach;with chair alarm set     Time: 1140-1155 PT Time Calculation (min) (ACUTE ONLY): 15 min  Charges:  $Gait Training: 8-22 mins                    G Codes:      Melford Aase 2016-06-23, 12:33 PM Elwyn Reach, [place]

## 2016-06-15 NOTE — Clinical Social Work Note (Signed)
CSW spoke with patient's daughter via telephone. Patient is continuing to refusing SNF placement. Patient's daughter reports "patient is going to do what he want's it doesn't matter what anyone tells him." Per PT's recent evaluation patient now requires min assist however, recommended that someone stay with him 24 hrs in order to prevent falls. CSW provided the daughter with this update. The daughter will reach out to her brother who is unemployed and see if he can stay with the patient while the daughter is at work. Patients daughter is prepared for patient to return home. Patient's daughter is requesting a walker. Per CM there is a order for a walker and CM will discuss home health with daughter. Patient's daughter states she will be a the hospital at 12:30 with her aunt to transport her father home. No further patient needs from [place], [place] signing off at this time.  [address], [place]

## 2016-06-15 NOTE — Progress Notes (Signed)
Patient Name: [name] Date of Encounter: 06/15/2016  Primary Cardiologist: Dr. Martinique  Hospital Problem List     Active Problems:   Acute hyperkalemia   Pressure injury of skin   Acute respiratory failure with hypoxia (HCC)   Endotracheally intubated   Scrotal mass   Sepsis (HCC)   AKI (acute kidney injury) ([place])   NSVT (nonsustained ventricular tachycardia) (HCC)   Dilated cardiomyopathy ([place])   Septic shock (HCC)   Epigastric pain   Abdominal fluid collection   Abdominal pain     Subjective   Sitting on the side of the bed, watching TV.   Inpatient Medications    Scheduled Meds: . carvedilol  3.125 mg Oral BID WC  . feeding supplement (ENSURE ENLIVE)  237 mL Oral BID BM  . insulin aspart  2-6 Units Subcutaneous TID WC & HS  . sodium chloride flush  3 mL Intravenous Q12H   Continuous Infusions:   PRN Meds: sodium chloride   Vital Signs    Vitals:   06/14/16 1808 06/14/16 1929 06/15/16 0524 06/15/16 0831  BP: 103/64 115/67 105/66 116/61  Pulse: 84 64 81 75  Resp: 18 18 20  (!) 21  Temp:  97.8 F (36.6 C) 98.6 F (37 C) (!) 96.8 F (36 C)  TempSrc:  Oral Oral Oral  SpO2: 93% 90% 99% 98%  Weight:   181 lb 11.2 oz (82.4 kg)   Height:        Intake/Output Summary (Last 24 hours) at 06/15/16 1014 Last data filed at 06/15/16 0830  Gross per 24 hour  Intake              720 ml  Output              900 ml  Net             -180 ml   Filed Weights   06/13/16 2026 06/14/16 0501 06/15/16 0524  Weight: 184 lb 11.2 oz (83.8 kg) 184 lb (83.5 kg) 181 lb 11.2 oz (82.4 kg)    Physical Exam   GEN: Well nourished, well developed, older black male in no acute distress.  HEENT: Grossly normal.  Neck: Supple, no JVD, carotid bruits, or masses. Cardiac: RRR, no murmurs, rubs, or gallops. No clubbing, cyanosis, edema.  Radials/DP/PT 2+ and equal bilaterally.  Respiratory:  Respirations regular and unlabored, Mlld rhonchi GI: Soft, nontender,  nondistended, BS + x 4. MS: no deformity or atrophy. Right upper extremity edema and redness, improving Skin: warm and dry, no rash. Neuro:  Strength and sensation are intact. Psych: AAOx3.  Normal affect.  Labs    CBC  Recent Labs  06/14/16 0223 06/15/16 0309  WBC 6.9 5.7  HGB 7.9* 7.4*  HCT 27.8* 25.9*  MCV 79.9 80.2  PLT 112* 99*   Basic Metabolic Panel  Recent Labs  06/13/16 0529 06/14/16 0223 06/15/16 0309  NA 145 146* 145  K 3.9 3.8 4.1  CL 115* 117* 116*  CO2 26 22 21*  GLUCOSE 93 108* 93  BUN 26* 25* 20  CREATININE 1.75* 1.85* 1.74*  CALCIUM 8.1* 8.2* 8.2*  MG 1.9 1.9  --   PHOS 2.5 1.5*  --    Liver Function Tests  Recent Labs  06/14/16 0223 06/15/16 0309  AST  --  26  ALT  --  17  ALKPHOS  --  65  BILITOT  --  0.5  PROT  --  5.8*  ALBUMIN 2.1* 2.0*  No results for input(s): LIPASE, AMYLASE in the last 72 hours. Cardiac Enzymes No results for input(s): CKTOTAL, CKMB, CKMBINDEX, TROPONINI in the last 72 hours. BNP Invalid input(s): POCBNP D-Dimer No results for input(s): DDIMER in the last 72 hours. Hemoglobin A1C No results for input(s): HGBA1C in the last 72 hours. Fasting Lipid Panel No results for input(s): CHOL, HDL, LDLCALC, TRIG, CHOLHDL, LDLDIRECT in the last 72 hours. Thyroid Function Tests No results for input(s): TSH, T4TOTAL, T3FREE, THYROIDAB in the last 72 hours.  Invalid input(s): FREET3  Telemetry    SR with episodes of NSVT,and SVT - Personally Reviewed  ECG    N/A - Personally Reviewed  Radiology    No results found.  Cardiac Studies   TTE: 06/08/16  Study Conclusions  - Left ventricle: The cavity size was severely dilated. There was   moderate concentric hypertrophy. Systolic function was severely   reduced. The estimated ejection fraction was in the range of 20%   to 25%. Diffuse hypokinesis. There is akinesis of the   basal-midanteroseptal and inferoseptal myocardium. There is   akinesis of the  entireinferior myocardium. Features are   consistent with a pseudonormal left ventricular filling pattern,   with concomitant abnormal relaxation and increased filling   pressure (grade 2 diastolic dysfunction). Doppler parameters are   consistent with high ventricular filling pressure. - Left atrium: The atrium was severely dilated.  Patient Profile     70 year old male with PMH which is significant for CHF (although unclear what last EF is), CKD III, COPD, diverticulitis with perforation and now ostomy since 2009, and arthritis. He presented with lethargy and malaise, found to have K > 7.5, Creatinine was 11.3, had new fungating scrotal/testicular mass and possible intraabdominal mass, developed some non sustained VT and Cardiology was consulted.   Assessment & Plan    1. Ventricular tachycardia: In the setting of shock and cardiomyopathy. Continues to have NSVT, and SVT on telemetry. Was started on coreg yesterday and tolerated well. Blood pressure remains stable. K+ and Mag ok.   2. Cardiomyopathy: Echo findings of reduced EF of 20-25%, akinesis of the basal-midanteroseptal and inferoseptal myocardium. There isakinesis of the entire inferior myocardium. Cardiac cath in 2006 showed normal coronary anatomy with low EF. Not a cath candidate at this time. Not a candidate for ACEi, but coreg started yesterday. Does not appear to be volume overloaded.   3. ARF: Improved. Monitor BMET  4. Shock/respiratory failure- suspect more septic but could have cardiogenic component. Extubated on 10/29. Resolved.  5. 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  6. Anemia: Variable, monitor CBC.  Signed, Reino Bellis, NP  06/15/2016, 10:14 AM   Pt seen and examined  Wants to go home   ON exam:  Lungs with rhonchi Cardiac exam with RRR  No S3 EXt with triv edema  Pt with LVEF 20 to25%  Will need cardiology f/u  (seen remotely in  2006) Keep on b blocker  Not a candidate for ACE I due to renal dysfunction Volume statu9 is OK WIll make sure htat he has outpt f/u.   Appt made in cardiology for Nov 20 (Monday) with Kathleen Argue  10 :15  AM  I am in office that day.  Dorris Carnes

## 2016-06-20 ENCOUNTER — Other Ambulatory Visit (HOSPITAL_COMMUNITY): Payer: Self-pay | Admitting: Urology

## 2016-06-20 DIAGNOSIS — N430 Encysted hydrocele: Secondary | ICD-10-CM

## 2016-06-20 DIAGNOSIS — D49512 Neoplasm of unspecified behavior of left kidney: Secondary | ICD-10-CM

## 2016-06-30 ENCOUNTER — Ambulatory Visit (HOSPITAL_COMMUNITY)
Admission: RE | Admit: 2016-06-30 | Discharge: 2016-06-30 | Disposition: A | Discharge status: Home / Self Care | Payer: Medicare Other | Source: Ambulatory Visit | Attending: Urology | Admitting: Urology

## 2016-06-30 ENCOUNTER — Encounter (HOSPITAL_COMMUNITY)
Admission: RE | Admit: 2016-06-30 | Discharge: 2016-06-30 | Disposition: A | Discharge status: Home / Self Care | Payer: Medicare Other | Source: Ambulatory Visit | Attending: Urology | Admitting: Urology

## 2016-06-30 DIAGNOSIS — D49512 Neoplasm of unspecified behavior of left kidney: Secondary | ICD-10-CM | POA: Insufficient documentation

## 2016-06-30 DIAGNOSIS — N281 Cyst of kidney, acquired: Secondary | ICD-10-CM | POA: Insufficient documentation

## 2016-06-30 DIAGNOSIS — N430 Encysted hydrocele: Secondary | ICD-10-CM

## 2016-07-04 ENCOUNTER — Encounter: Payer: Self-pay | Admitting: Physician Assistant

## 2016-07-04 ENCOUNTER — Telehealth: Payer: Self-pay | Admitting: *Deleted

## 2016-07-04 ENCOUNTER — Ambulatory Visit (INDEPENDENT_AMBULATORY_CARE_PROVIDER_SITE_OTHER): Payer: Medicare Other | Admitting: Physician Assistant

## 2016-07-04 VITALS — BP 118/52 | HR 90 | Ht 69.0 in | Wt 169.0 lb

## 2016-07-04 DIAGNOSIS — N183 Chronic kidney disease, stage 3 unspecified: Secondary | ICD-10-CM

## 2016-07-04 DIAGNOSIS — I420 Dilated cardiomyopathy: Secondary | ICD-10-CM | POA: Diagnosis not present

## 2016-07-04 DIAGNOSIS — N5089 Other specified disorders of the male genital organs: Secondary | ICD-10-CM

## 2016-07-04 DIAGNOSIS — I5022 Chronic systolic (congestive) heart failure: Secondary | ICD-10-CM

## 2016-07-04 DIAGNOSIS — I472 Ventricular tachycardia: Secondary | ICD-10-CM | POA: Diagnosis not present

## 2016-07-04 DIAGNOSIS — I4729 Other ventricular tachycardia: Secondary | ICD-10-CM

## 2016-07-04 DIAGNOSIS — N509 Disorder of male genital organs, unspecified: Secondary | ICD-10-CM | POA: Diagnosis not present

## 2016-07-04 HISTORY — DX: Chronic systolic (congestive) heart failure: I50.22

## 2016-07-04 LAB — BASIC METABOLIC PANEL
BUN: 28 mg/dL — ABNORMAL HIGH (ref 7–25)
CALCIUM: 9.2 mg/dL (ref 8.6–10.3)
CO2: 19 mmol/L — ABNORMAL LOW (ref 20–31)
Chloride: 107 mmol/L (ref 98–110)
Creat: 1.55 mg/dL — ABNORMAL HIGH (ref 0.70–1.18)
GLUCOSE: 99 mg/dL (ref 65–99)
POTASSIUM: 5.1 mmol/L (ref 3.5–5.3)
SODIUM: 135 mmol/L (ref 135–146)

## 2016-07-04 MED ORDER — CARVEDILOL 6.25 MG PO TABS: 6.2500 mg | ORAL_TABLET | Freq: Two times a day (BID) | ORAL | 3 refills | Status: DC

## 2016-07-04 NOTE — Patient Instructions (Addendum)
Medication Instructions:  1. INCREASE COREG TO 3.125 MG IN THE MORNING AND 6.25 MG IN THE PM; DO THIS INCREASE FOR 3 DAYS; AFTER THE 3 DAYS YOU WILL INCREASE TO COREG 6.25 MG TWICE DAILY; A NEW RX HAS BEEN SENT IN FOR THE COREG 6.25 MG TABLET.  Labwork: 1. TODAY BMET  Testing/Procedures: 1. Your physician has requested that you have an echocardiogram. THIS IS TO BE DONE SOMETIME THE END OF January 2018. Echocardiography is a painless test that uses sound waves to create images of your heart. It provides your doctor with information about the size and shape of your heart and how well your heart's chambers and valves are working. This procedure takes approximately one hour. There are no restrictions for this procedure.  2. Your physician has requested that you have a lexiscan myoview. For further information please visit [email or web address]. Please follow instruction sheet, as given.  3. Your physician has recommended that you wear a holter monitor. THIS IS TO BE DONE 2 WEEKS AFTER INCREASE OF COREG TO 6.25 MG BID STARTING 07/08/16; MONITOR TO BE PUT ON 07/22/16 IF POSSIBLE. Holter monitors are medical devices that record the heart's electrical activity. Doctors most often use these monitors to diagnose arrhythmias. Arrhythmias are problems with the speed or rhythm of the heartbeat. The monitor is a small, portable device. You can wear one while you do your normal daily activities. This is usually used to diagnose what is causing palpitations/syncope (passing out).  Follow-Up: 1. SCOTT WEAVER, PAC 3-4 WEEKS 2. DR. Martinique IN 2 MONTHS OR PR/NP AT [facility] Any Other Special Instructions Will Be Listed Below (If Applicable).  If you need a refill on your cardiac medications before your next appointment, please call your pharmacy.

## 2016-07-04 NOTE — Progress Notes (Signed)
Cardiology Office Note:    Date:  07/04/2016   ID:  [name], DOB 07/29/1946, MRN [id number]  PCP:  Philis Fendt, MD  Cardiologist:  Dr. Peter Martinique   Electrophysiologist:  N/a Urologist: Dr. Alinda Money  Referring MD: [name], MD   Chief Complaint  Patient presents with  . Hospitalization Follow-up    CHF, NSVT    History of Present Illness:    [name] is a 70 y.o. male with a hx of NICM, systolic CHF, CKD 3, COPD, diverticulitis with prior perforation s/p colostomy.  He has a hx of severe sepsis in the setting of perforated colon c/b HCAP 2/2 resistant Pseudomonas (Trach eventually placed). EF was 26% by Myoview. LHC at that time demonstrated no CAD.    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  He developed shock and respiratory failure and was intubated. He was supported with vasopressors. Patient was noted to have frequent ectopy and long runs of nonsustained ventricular tachycardia. He was seen by cardiology. He was placed on IV amiodarone as well as IV lidocaine.  Lidocaine was then discontinued.  Echocardiogram demonstrated EF 20-25 with anteroseptal, inferior and inferoseptal akinesis. Given his significant medical illness, he was not felt to be a candidate for cardiac catheterization. With his chronic kidney disease, he was not placed on ACE inhibitor. He was placed on beta blocker with carvedilol. Amiodarone was ultimately discontinued. Of note, his scrotal mass was evaluated by urology and notes indicate he would eventually require surgical excision. Intra-abdominal fluid collection was not felt to be an abscess but fluid-filled bowel. Serum creatinine was 1.74 at discharge.  Here for post hospital follow up.  He is here today with his daughter. Since DC from the hospital, he has been doing  fairly well. He denies significant dyspnea. He denies chest discomfort, syncope, orthopnea, PND or edema. He has seen urology back in follow-up. Ultimate plan is to proceed with surgical excision of the condylomata. I reviewed the notes from Dr. Alinda Money. Surgical excision would not be a major surgery but there is risk for significant blood loss.  Prior CV studies that were reviewed today include:    UE venous duplex 06/13/16 - No evidence of deep vein thrombosis involving the right upper   extremity and left subclavian vein. - Findings consistent with acute superficial vein thrombosis   involving the right cephalic vein, antecubital fossa to axilla   level.  Echo 06/08/16 Moderate concentric LVH, EF 20-25, diffuse HK, anteroseptal, inferior and inferoseptal akinesis, grade 2 diastolic dysfunction, severe LAE  LHC 7/06 1.  Left ventricle:  118/9/11. 2.  Left main:  Angiographically normal. 3.  LAD:  Moderate size vessel giving rise to a single diagonal, angiographically normal. 4.  Ramus intermedius:  There are two parallel ramus vessels, both are angiographically normal. 5.  Circumflex:  Moderate size vessel giving rise to three obtuse marginals, angiographically normal. 6.  Right coronary artery:  Moderate size dominant vessel.  Angiographically normal. IMPRESSION/RECOMMENDATIONS:  Angiographically normal coronary arteries   Past Medical History:  Diagnosis Date  . Arthritis   . Chronic kidney disease, stage 3   . Chronic systolic CHF (congestive heart failure) ([place]) 07/04/2016   A. Echo 10/17: Moderate concentric LVH, EF 20-25, diffuse HK, anteroseptal, inferior and inferoseptal akinesis, grade 2 diastolic dysfunction, severe LAE  . Colon obstruction  Sigmoid  . COPD (chronic obstructive pulmonary disease) ([place])   . Dilated cardiomyopathy ([place])    A. LHC 7/06: normal coronary arteries  . Diverticulitis   . Erectile dysfunction   . Fistula    chronic enterocutaneous  . Gout     . Hypertension   . Inguinal hernia   . NSVT (nonsustained ventricular tachycardia) (HCC)    A. admx in 123XX123 with metabolic derangement in setting of septic shock - NSVT tx with Amio/Lidocaine; EF 20-25 on echo; Dc'd on beta-blocker (Coreg) only (no Amiodarone)  . Osteoarthritis   . Perforated bowel ([place])    perforation of the cecum  . Peritonitis [facility])     Past Surgical History:  Procedure Laterality Date  . COLON SURGERY      Current Medications: Current Meds  Medication Sig  . albuterol (PROVENTIL) (2.5 MG/3ML) 0.083% nebulizer solution Take 2.5 mg by nebulization every 6 (six) hours as needed for wheezing or shortness of breath.  Marland Kitchen albuterol-ipratropium (COMBIVENT) 18-103 MCG/ACT inhaler Inhale 1 puff into the lungs 2 (two) times daily.  Marland Kitchen allopurinol (ZYLOPRIM) 100 MG tablet Take 100 mg by mouth daily.  . budesonide-formoterol (SYMBICORT) 160-4.5 MCG/ACT inhaler Inhale 2 puffs into the lungs 2 (two) times daily.  . diclofenac sodium (VOLTAREN) 1 % GEL Apply 2 g topically 4 (four) times daily as needed (pain).  . ferrous sulfate 325 (65 FE) MG tablet Take 325 mg by mouth 3 (three) times daily with meals.  . furosemide (LASIX) 40 MG tablet Take 1 tablet (40 mg total) by mouth daily as needed.  . nystatin (MYCOSTATIN/NYSTOP) powder Apply topically 3 (three) times daily. To affected area  . RA ASPIRIN EC 81 MG EC tablet Take 81 mg by mouth daily.  Marland Kitchen tiZANidine (ZANAFLEX) 4 MG tablet Take 4 mg by mouth 2 (two) times daily.   . traMADol (ULTRAM) 50 MG tablet Take 50 mg by mouth 2 (two) times daily as needed for moderate pain or severe pain.   . [DISCONTINUED] carvedilol (COREG) 3.125 MG tablet Take 1 tablet (3.125 mg total) by mouth 2 (two) times daily with a meal.     Allergies:   Patient has no known allergies.   Social History   Social History  . Marital status: Divorced    Spouse name: N/A  . Number of children: N/A  . Years of education: N/A   Social History Main  Topics  . Smoking status: Current Every Day Smoker  . Smokeless tobacco: Never Used  . Alcohol use No  . Drug use: Unknown  . Sexual activity: Not Asked   Other Topics Concern  . None   Social History Narrative  . None     Family History:  The patient's Family history is unknown by patient.   ROS:   Please see the history of present illness.    Review of Systems  Respiratory: Positive for shortness of breath.    All other systems reviewed and are negative.   EKGs/Labs/Other Test Reviewed:    EKG:  EKG is  ordered today.  The ekg ordered today demonstrates NSR, HR 90, left axis deviation, IVCD, PVCs with one couplet  Recent Labs: 06/14/2016: Magnesium 1.9 06/15/2016: ALT 17; Hemoglobin 7.4; Platelets 99 07/04/2016: BUN 28; Creat 1.55; Potassium 5.1; Sodium 135   Recent Lipid Panel No results found for: CHOL, TRIG, HDL, CHOLHDL, VLDL, LDLCALC, LDLDIRECT   Physical Exam:    VS:  BP (!) 118/52   Pulse 90   Ht  5\' 9"  (1.753 m)   Wt 169 lb (76.7 kg)   BMI 24.96 kg/m     Wt Readings from Last 3 Encounters:  07/04/16 169 lb (76.7 kg)  06/15/16 181 lb 11.2 oz (82.4 kg)     Physical Exam  Constitutional: He is oriented to person, place, and time. He appears well-developed and well-nourished. No distress.  HENT:  Head: Normocephalic and atraumatic.  Eyes: No scleral icterus.  Neck: No JVD present.  Cardiovascular: Normal rate, regular rhythm and normal heart sounds.   No murmur heard. Pulmonary/Chest: Effort normal. He has no wheezes. He has no rales.  Abdominal: Soft. There is no tenderness.  Musculoskeletal: He exhibits no edema.  Neurological: He is alert and oriented to person, place, and time.  Skin: Skin is warm and dry.  Psychiatric: He has a normal mood and affect.    ASSESSMENT:    1. Dilated cardiomyopathy ([place])   2. Chronic systolic CHF (congestive heart failure) ([place])   3. NSVT (nonsustained ventricular tachycardia) (HCC)   4. Scrotal mass   5.  CKD (chronic kidney disease) stage 3, GFR 30-59 ml/min    PLAN:    In order of problems listed above:  1. Dilated cardiomyopathy - Previous cardiac catheterization in 2006 demonstrated no coronary artery disease.  Reviewed his case today with Dr. Harrington Challenger, who saw him in the hospital several times prior to discharge.  It has been a long time since his cardiac catheterization. However, we suspect that this continues to be a nonischemic cardiomyopathy. He does need surgical excision of the condylomata on his scrotum. Urology plans to wait several weeks prior to considering with proceeding. After review with Dr. Harrington Challenger, we decided to proceed with noninvasive study. At this time, he is not a candidate for cardiac catheterization. He still has a lot of ectopy on his ECG. Question if PVCs have contributed to his cardiomyopathy.  -  Arrange Lexiscan Myoview - looking for high risk features  -  Titrate beta blocker further - increase Coreg to 6.25 mg twice a day  -  Arrange 24-hour Holter to assess PVC burden in 2-3 weeks  -  Plan repeat echo in 08/2016 to recheck EF  2. Chronic systolic CHF - NYHA 2b.  Volume appears stable on exam today.   -  No ACE inhibitor secondary to CKD  -  Increase Carvedilol as noted   -  Try to add Hydralazine at follow up if blood pressure will tol.  3. Nonsustained VT - Increase beta-blocker as noted.  4. 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 He will need surgical excision at some point.  Will get Myoview as noted for risk stratificaiton.  5. CKD - Creatinine at discharge 1.74.  Repeat BMET today.   Dispo - FU with me in 3-4 weeks for med titration.  FU with Dr. Peter Martinique in 2 mos - prefer after echocardiogram.  Medication Adjustments/Labs and Tests Ordered: Current medicines are reviewed at length with the patient today.  Concerns regarding medicines are outlined above.  Medication changes, Labs and Tests ordered today are outlined in the Patient  Instructions noted below. Patient Instructions  Medication Instructions:  1. INCREASE COREG TO 3.125 MG IN THE MORNING AND 6.25 MG IN THE PM; DO THIS INCREASE FOR 3 DAYS; AFTER THE 3 DAYS YOU WILL INCREASE TO COREG 6.25 MG TWICE DAILY; A NEW RX HAS BEEN SENT IN FOR THE COREG 6.25 MG TABLET.  Labwork: 1. TODAY  BMET  Testing/Procedures: 1. Your physician has requested that you have an echocardiogram. THIS IS TO BE DONE SOMETIME THE END OF January 2018. Echocardiography is a painless test that uses sound waves to create images of your heart. It provides your doctor with information about the size and shape of your heart and how well your heart's chambers and valves are working. This procedure takes approximately one hour. There are no restrictions for this procedure.  2. Your physician has requested that you have a lexiscan myoview. For further information please visit [email or web address]. Please follow instruction sheet, as given.  3. Your physician has recommended that you wear a holter monitor. THIS IS TO BE DONE 2 WEEKS AFTER INCREASE OF COREG TO 6.25 MG BID STARTING 07/08/16; MONITOR TO BE PUT ON 07/22/16 IF POSSIBLE. Holter monitors are medical devices that record the heart's electrical activity. Doctors most often use these monitors to diagnose arrhythmias. Arrhythmias are problems with the speed or rhythm of the heartbeat. The monitor is a small, portable device. You can wear one while you do your normal daily activities. This is usually used to diagnose what is causing palpitations/syncope (passing out).  Follow-Up: 1. Dugan Vanhoesen, PAC 3-4 WEEKS 2. DR. Martinique IN 2 MONTHS OR PR/NP AT [facility] Any Other Special Instructions Will Be Listed Below (If Applicable).  If you need a refill on your cardiac medications before your next appointment, please call your pharmacy.  Signed, Richardson Dopp, PA-C  07/04/2016 5:24 PM    [facility] [place],  [place], [place]  [address] Phone: [phone]; Fax: [phone]

## 2016-07-04 NOTE — Telephone Encounter (Signed)
Pt notified of lab results by phone with verbal understanding.  

## 2016-07-20 ENCOUNTER — Telehealth (HOSPITAL_COMMUNITY): Payer: Self-pay | Admitting: *Deleted

## 2016-07-20 NOTE — Telephone Encounter (Signed)
Attempted to call regarding upcoming appointment- no answer, unable to leave message.  [name]

## 2016-07-21 ENCOUNTER — Telehealth: Payer: Self-pay | Admitting: Cardiology

## 2016-07-21 NOTE — Telephone Encounter (Signed)
New message      Pt c/o BP issue: STAT if pt c/o blurred vision, one-sided weakness or slurred speech  1. What are your last 5 BP readings? 90/50 2. Are you having any other symptoms (ex. Dizziness, headache, blurred vision, passed out)? Daughter is not sure if he is having symptoms  3. What is your BP issue? Home health nurse saw pt today and reported that his bp was low.  Daughter states that pt recently had an increase in his bp meds.  Pt is scheduled for a stress test on Monday.  Please call and let daughter know if meds need to be adjusted

## 2016-07-21 NOTE — Telephone Encounter (Signed)
Spoke with daughter states that when advanced home care nurse was there today pt's BP was 90/50. Daughter states Pt denies any other symptoms, chest pain or pressure, shortness of breath, edema, no lightheaded or dizziness. Nurse is Willodean Rosenthal [phone].

## 2016-07-22 NOTE — Telephone Encounter (Signed)
[name]-daughter notified she will notify the home care nurse to call us with his future vital signs.

## 2016-07-22 NOTE — Telephone Encounter (Signed)
If he is not having any symptoms I  Would just observe for now. He may need to adjust to new dose of Coreg.   Peter Martinique MD, [facility]

## 2016-07-25 ENCOUNTER — Encounter (HOSPITAL_COMMUNITY): Payer: Medicare Other

## 2016-08-01 ENCOUNTER — Telehealth (HOSPITAL_COMMUNITY): Payer: Self-pay | Admitting: *Deleted

## 2016-08-01 ENCOUNTER — Ambulatory Visit: Payer: Medicare Other | Admitting: Physician Assistant

## 2016-08-01 NOTE — Telephone Encounter (Signed)
Patient given detailed instructions per Myocardial Perfusion Study Information Sheet for the test on 08/03/16 at 1000. Patient notified to arrive 15 minutes early and that it is imperative to arrive on time for appointment to keep from having the test rescheduled.  If you need to cancel or reschedule your appointment, please call the office within 24 hours of your appointment. Failure to do so may result in a cancellation of your appointment, and a $50 no show fee. Patient verbalized understanding.Rielyn Krupinski, [facility] [place]

## 2016-08-03 ENCOUNTER — Ambulatory Visit (HOSPITAL_COMMUNITY): Payer: Medicare Other | Attending: Physician Assistant

## 2016-08-03 ENCOUNTER — Ambulatory Visit (INDEPENDENT_AMBULATORY_CARE_PROVIDER_SITE_OTHER): Payer: Medicare Other

## 2016-08-03 DIAGNOSIS — I4729 Other ventricular tachycardia: Secondary | ICD-10-CM

## 2016-08-03 DIAGNOSIS — I472 Ventricular tachycardia: Secondary | ICD-10-CM

## 2016-08-03 DIAGNOSIS — R9439 Abnormal result of other cardiovascular function study: Secondary | ICD-10-CM | POA: Diagnosis not present

## 2016-08-03 DIAGNOSIS — I420 Dilated cardiomyopathy: Secondary | ICD-10-CM

## 2016-08-03 DIAGNOSIS — I119 Hypertensive heart disease without heart failure: Secondary | ICD-10-CM | POA: Diagnosis not present

## 2016-08-03 DIAGNOSIS — Z01818 Encounter for other preprocedural examination: Secondary | ICD-10-CM | POA: Insufficient documentation

## 2016-08-03 LAB — MYOCARDIAL PERFUSION IMAGING
CSEPPHR: 83 {beats}/min
RATE: 0.31
Rest HR: 72 {beats}/min
SDS: 3
SRS: 1
SSS: 4
TID: 1.04

## 2016-08-03 MED ORDER — TECHNETIUM TC 99M TETROFOSMIN IV KIT
10.1000 | PACK | Freq: Once | INTRAVENOUS | Status: AC | PRN
Start: 2016-08-03 — End: 2016-08-03
  Administered 2016-08-03: 10.1 via INTRAVENOUS
  Filled 2016-08-03: qty 11

## 2016-08-03 MED ORDER — TECHNETIUM TC 99M TETROFOSMIN IV KIT
30.6000 | PACK | Freq: Once | INTRAVENOUS | Status: AC | PRN
  Administered 2016-08-03: 30.6 via INTRAVENOUS
  Filled 2016-08-03: qty 31

## 2016-08-03 MED ORDER — REGADENOSON 0.4 MG/5ML IV SOLN
0.4000 mg | Freq: Once | INTRAVENOUS | Status: AC
Start: 2016-08-03 — End: 2016-08-03
  Administered 2016-08-03: 0.4 mg via INTRAVENOUS

## 2016-08-04 ENCOUNTER — Encounter: Payer: Self-pay | Admitting: Physician Assistant

## 2016-08-04 ENCOUNTER — Telehealth: Payer: Self-pay | Admitting: Cardiology

## 2016-08-04 ENCOUNTER — Telehealth: Payer: Self-pay | Admitting: *Deleted

## 2016-08-04 NOTE — Telephone Encounter (Signed)
Follow Up:    Does the pt still need to keep his appt tomorrow?

## 2016-08-04 NOTE — Progress Notes (Deleted)
Cardiology Office Note    Date:  08/04/2016   ID:  [name], DOB 07-15-1946, MRN [id number]  PCP:  Stephen Fendt, MD  Cardiologist:  Dr. Martinique Chief Complaint: Myoview  follow up  History of Present Illness:   [name] is a 70 y.o. male with hx of dilated NICM, chronic systolic CHF, CKD stage III, COPD and diverticulitis with prior perforation s/p colostomy who presented for follow up.   LHC in 2006 demonstrated no CAD.  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  He developed shock and respiratory failure and was intubated. He was supported with vasopressors. Patient was noted to have frequent ectopy and long runs of nonsustained ventricular tachycardia. He was seen by cardiology. He was placed on IV amiodarone as well as IV lidocaine.  Lidocaine was then discontinued.  Echocardiogram demonstrated EF 20-25 with anteroseptal, inferior and inferoseptal akinesis. Given his significant medical illness, he was not felt to be a candidate for cardiac catheterization. With his chronic kidney disease, he was not placed on ACE inhibitor. He was placed on beta blocker with carvedilol. Amiodarone was ultimately discontinued. Of note, his scrotal mass was evaluated by urology and notes indicate he would eventually require surgical excision. Intra-abdominal fluid collection was not felt to be an abscess but fluid-filled bowel.  Seen in clinic 07/04/16 for hospital follow up. Due to lot of ectopy on EKG coreg increased to 6.25mg  BID. Plan to add hydralazine BP allows. Myoview was high risk study with ef of <30%, no evidence of ischemia or infract. There are very frequent multifocal PVCs during the stress test. Monitor result is pending. Plan to repeat echo prior to appointment with Dr. Martinique next month.   Here today for  follow up.   Past Medical History:  Diagnosis Date  . Arthritis   . Chronic kidney disease, stage 3   . Chronic systolic CHF (congestive heart failure) ([place]) 07/04/2016   A. Echo 10/17: Moderate concentric LVH, EF 20-25, diffuse HK, anteroseptal, inferior and inferoseptal akinesis, grade 2 diastolic dysfunction, severe LAE  . Colon obstruction    Sigmoid  . COPD (chronic obstructive pulmonary disease) ([place])   . Dilated cardiomyopathy ([place])    A. LHC 7/06: normal coronary arteries  //  b. Myoview 12/17: severely dilated LV, frequent multifocal PVCs, EF < 30, no infarct or ischemia (c/w dilated NICM), high risk  . Diverticulitis   . Erectile dysfunction   . Fistula    chronic enterocutaneous  . Gout   . Hypertension   . Inguinal hernia   . NSVT (nonsustained ventricular tachycardia) (HCC)    A. admx in 123XX123 with metabolic derangement in setting of septic shock - NSVT tx with Amio/Lidocaine; EF 20-25 on echo; Dc'd on beta-blocker (Coreg) only (no Amiodarone)  . Osteoarthritis   . Perforated bowel ([place])    perforation of the cecum  . Peritonitis [facility])     Past Surgical History:  Procedure Laterality Date  . COLON SURGERY      Current Medications: Prior to Admission medications   Medication Sig Start Date End Date Taking? Authorizing Provider  albuterol (PROVENTIL) (2.5 MG/3ML) 0.083% nebulizer solution Take 2.5 mg by nebulization every 6 (six) hours as needed for wheezing or shortness of breath.    Historical Provider, MD  albuterol-ipratropium (COMBIVENT) 18-103 MCG/ACT inhaler Inhale 1 puff into the lungs  2 (two) times daily.    Historical Provider, MD  allopurinol (ZYLOPRIM) 100 MG tablet Take 100 mg by mouth daily.    Historical Provider, MD  budesonide-formoterol (SYMBICORT) 160-4.5 MCG/ACT inhaler Inhale 2 puffs into the lungs 2 (two) times daily.    Historical Provider, MD  carvedilol (COREG) 6.25 MG tablet Take 1 tablet (6.25 mg total) by mouth 2 (two) times daily.  07/08/16   Stephen Shi, PA-C  diclofenac sodium (VOLTAREN) 1 % GEL Apply 2 g topically 4 (four) times daily as needed (pain).    Historical Provider, MD  ferrous sulfate 325 (65 FE) MG tablet Take 325 mg by mouth 3 (three) times daily with meals.    Historical Provider, MD  furosemide (LASIX) 40 MG tablet Take 1 tablet (40 mg total) by mouth daily as needed. 06/15/16   Stephen Karlyne Greenspan, MD  nystatin (MYCOSTATIN/NYSTOP) powder Apply topically 3 (three) times daily. To affected area    Historical Provider, MD  RA ASPIRIN EC 81 MG EC tablet Take 81 mg by mouth daily. 06/24/16   Historical Provider, MD  tiZANidine (ZANAFLEX) 4 MG tablet Take 4 mg by mouth 2 (two) times daily.  06/24/16   Historical Provider, MD  traMADol (ULTRAM) 50 MG tablet Take 50 mg by mouth 2 (two) times daily as needed for moderate pain or severe pain.     Historical Provider, MD    Allergies:   Patient has no known allergies.   Social History   Social History  . Marital status: Divorced    Spouse name: N/A  . Number of children: N/A  . Years of education: N/A   Social History Main Topics  . Smoking status: Current Every Day Smoker  . Smokeless tobacco: Never Used  . Alcohol use No  . Drug use: Unknown  . Sexual activity: Not on file   Other Topics Concern  . Not on file   Social History Narrative  . No narrative on file     Family History:  The patient's Family history is unknown by patient. ***  ROS:   Please see the history of present illness.    ROS All other systems reviewed and are negative.   PHYSICAL EXAM:   VS:  There were no vitals taken for this visit.   GEN: Well nourished, well developed, in no acute distress  HEENT: normal  Neck: no JVD, carotid bruits, or masses Cardiac: ***RRR; no murmurs, rubs, or gallops,no edema  Respiratory:  clear to auscultation bilaterally, normal work of breathing GI: soft, nontender, nondistended, + BS MS: no deformity or atrophy  Skin: warm and dry, no  rash Neuro:  Alert and Oriented x 3, Strength and sensation are intact Psych: euthymic mood, full affect  Wt Readings from Last 3 Encounters:  08/03/16 169 lb (76.7 kg)  07/04/16 169 lb (76.7 kg)  06/15/16 181 lb 11.2 oz (82.4 kg)      Studies/Labs Reviewed:   EKG:  EKG is ordered today.  The ekg ordered today demonstrates ***  Recent Labs: 06/14/2016: Magnesium 1.9 06/15/2016: ALT 17; Hemoglobin 7.4; Platelets 99 07/04/2016: BUN 28; Creat 1.55; Potassium 5.1; Sodium 135   Lipid Panel No results found for: CHOL, TRIG, HDL, CHOLHDL, VLDL, LDLCALC, LDLDIRECT  Additional studies/ records that were reviewed today include:   UE venous duplex 06/13/16 - No evidence of deep vein thrombosis involving the right upper extremity and left subclavian vein. - Findings consistent with acute superficial vein thrombosis involving the right cephalic  vein, antecubital fossa to axilla level.  Echo 06/08/16 Moderate concentric LVH, EF 20-25, diffuse HK, anteroseptal, inferior and inferoseptal akinesis, grade 2 diastolic dysfunction, severe LAE  LHC 7/06 1. Left ventricle: 118/9/11. 2. Left main: Angiographically normal. 3. LAD: Moderate size vessel giving rise to a single diagonal, angiographically normal. 4. Ramus intermedius: There are two parallel ramus vessels, both areangiographically normal. 5. Circumflex: Moderate size vessel giving rise to three obtuse marginals, angiographically normal. 6. Right coronary artery: Moderate size dominant vessel. Angiographicallynormal. IMPRESSION/RECOMMENDATIONS: Angiographically normal coronary arteries    ASSESSMENT & PLAN:    1. Dilated NICM   2. Chronic systolic CHF  3. NSVT      Medication Adjustments/Labs and Tests Ordered: Current medicines are reviewed at length with the patient today.  Concerns regarding medicines are outlined above.  Medication changes, Labs and Tests ordered today are listed in the  Patient Instructions below. There are no Patient Instructions on file for this visit.   Jarrett Soho, PA  08/04/2016 1:20 PM    [facility] [place], [place], [place]  [address] Phone: [phone]; Fax: [phone]

## 2016-08-04 NOTE — Telephone Encounter (Signed)
I returned pt's call in regards did he need to keep his appt tomorrow. I advised pt yes to keep appt 12/22 @ 10:30. Pt asked for me to let his daughter [name] know as well..Daughter [name] aware pt needs to keep his appt tomorrow 12/22 @ 10:30.

## 2016-08-04 NOTE — Telephone Encounter (Signed)
Pt notified of Myoview results and findings by phone with verbal understanding. I will send a copy to PCP as well.

## 2016-08-04 NOTE — Telephone Encounter (Signed)
DPR for pt's daughter Helene Kelp who called back to go over the Myoview results. She states pt was a little confused trying to tell her what the results were.

## 2016-08-05 ENCOUNTER — Ambulatory Visit: Payer: Medicare Other | Admitting: Physician Assistant

## 2016-08-08 ENCOUNTER — Telehealth: Payer: Self-pay | Admitting: Physician Assistant

## 2016-08-08 DIAGNOSIS — I472 Ventricular tachycardia: Secondary | ICD-10-CM

## 2016-08-08 DIAGNOSIS — I4729 Other ventricular tachycardia: Secondary | ICD-10-CM

## 2016-08-08 NOTE — Telephone Encounter (Signed)
Paged by Authur with [facility] [id number]) on abnormal finding related to recent Holter monitor. The Holter monitor was interrogated today. He has a history of nonischemic cardiomyopathy, recently admitted for sepsis and abdominal abscess. He required intubation. Cardiology was consulted at the time arrhythmia. He was briefly put on IV amiodarone while he was intubated, this was later discontinued. He is currently on 6.25 mg twice a day of carvedilol. According to the patient, he has a nurse will come to his home at least once a week to measure his blood pressure. He says his latest blood pressure is in the 120s. His last office visit was with Richardson Dopp PA-C, his blood pressure at that time was 118.  I have instructed the patient to increase his carvedilol to 12.5 mg twice a day. According to Holter monitor company, he had a total of 829 episode of nonsustained VT ranging between 3 beats up to 26 beats. The one time he had a 26 beats run was in the morning of 12/20 around 12:08 AM. So far he has been asymptomatic, he denies any recent dizziness, presyncope or syncope. The only time he has dizziness is when he gets up in the morning too fast. I did advise the patient that carvedilol is a blood pressure medication and he will need to monitor his blood pressure closely after increasing it. He display understanding of this and I appreciate the advise. His next visit with Dr. Martinique is on 08/22/2016 at which time he will need to be in assessed to potentially start on amiodarone by mouth.  Hilbert Corrigan PA Pager: [phone]

## 2016-08-09 ENCOUNTER — Telehealth: Payer: Self-pay | Admitting: Physician Assistant

## 2016-08-09 MED ORDER — CARVEDILOL 6.25 MG PO TABS: 9.3750 mg | ORAL_TABLET | Freq: Two times a day (BID) | ORAL | 3 refills | Status: DC

## 2016-08-09 NOTE — Telephone Encounter (Signed)
See phone note

## 2016-08-09 NOTE — Telephone Encounter (Signed)
[name], I reviewed [name]'s Holter monitor with Dr. Caryl Comes today. He noted that the patient would not be able to take Amiodarone due to bradycardia noted on Holter. He may be a candidate for Mexiletine to control PVCs and NSVT but avoid bradycardia. We decided to go ahead and arrange an appt with EP for a couple weeks after you see him on 1/8. If you decide he does not need EP at that appt, it can be cancelled. Thanks, [employer]

## 2016-08-09 NOTE — Telephone Encounter (Signed)
Reviewed Holter. He does have NSVT but also has periods of bradycardia. Please call patient.  Almyra Deforest, PA-C told him last night to increase it to 12.5 mg Twice daily.  But, I want him to only increase Coreg to 9.375 mg bid.  Please arrange a new patient visit with EP - it should be 2-3 weeks after he sees Dr. Peter Martinique - he has an appt with him on 08/22/2016.  I will review with Dr. Martinique as well.  He will decide if the appt with EP will be kept or cancelled at the appt on 1/8. Richardson Dopp, PA-C   08/09/2016 1:09 PM

## 2016-08-09 NOTE — Addendum Note (Signed)
Addended by: Michae Kava on: 08/09/2016 01:44 PM   Modules accepted: Orders

## 2016-08-09 NOTE — Telephone Encounter (Signed)
New message      Calling to get a clearer understanding on conversation that took place over the weekend regarding patient's medication.  Daughter states that her father did not understand

## 2016-08-09 NOTE — Telephone Encounter (Signed)
DPR ok to s/w daughter [name]. Went over new instructions with pt's daughter to change Coreg dose to 9.375 mg BID, (to take 1 and 1/2 tabs BID). Daughter aware we will refer to EP, though keep the appt with Dr. Martinique 08/22/16 and he will decide if EP needed. Daughter verbalized understanding to plan of care for the pt and read back coreg dose instructions to me x 2 .

## 2016-08-16 ENCOUNTER — Ambulatory Visit (HOSPITAL_COMMUNITY): Payer: Medicare Other | Attending: Cardiovascular Disease

## 2016-08-16 ENCOUNTER — Other Ambulatory Visit: Payer: Self-pay

## 2016-08-16 DIAGNOSIS — I255 Ischemic cardiomyopathy: Secondary | ICD-10-CM | POA: Diagnosis present

## 2016-08-16 DIAGNOSIS — E119 Type 2 diabetes mellitus without complications: Secondary | ICD-10-CM | POA: Insufficient documentation

## 2016-08-16 DIAGNOSIS — I340 Nonrheumatic mitral (valve) insufficiency: Secondary | ICD-10-CM | POA: Insufficient documentation

## 2016-08-16 DIAGNOSIS — J449 Chronic obstructive pulmonary disease, unspecified: Secondary | ICD-10-CM | POA: Diagnosis not present

## 2016-08-16 DIAGNOSIS — N189 Chronic kidney disease, unspecified: Secondary | ICD-10-CM | POA: Diagnosis not present

## 2016-08-16 DIAGNOSIS — Z720 Tobacco use: Secondary | ICD-10-CM | POA: Diagnosis not present

## 2016-08-16 DIAGNOSIS — I130 Hypertensive heart and chronic kidney disease with heart failure and stage 1 through stage 4 chronic kidney disease, or unspecified chronic kidney disease: Secondary | ICD-10-CM | POA: Insufficient documentation

## 2016-08-16 DIAGNOSIS — I252 Old myocardial infarction: Secondary | ICD-10-CM | POA: Insufficient documentation

## 2016-08-16 DIAGNOSIS — I5022 Chronic systolic (congestive) heart failure: Secondary | ICD-10-CM | POA: Diagnosis not present

## 2016-08-16 DIAGNOSIS — I420 Dilated cardiomyopathy: Secondary | ICD-10-CM | POA: Insufficient documentation

## 2016-08-16 DIAGNOSIS — R29898 Other symptoms and signs involving the musculoskeletal system: Secondary | ICD-10-CM | POA: Diagnosis not present

## 2016-08-17 ENCOUNTER — Encounter: Payer: Self-pay | Admitting: Physician Assistant

## 2016-08-17 ENCOUNTER — Telehealth: Payer: Self-pay | Admitting: *Deleted

## 2016-08-17 NOTE — Telephone Encounter (Signed)
DPR for daughter [name]. Lmtcb for daughter to call back to go over Myoview results.. VM not set up on pt's phone.

## 2016-08-18 NOTE — Telephone Encounter (Signed)
DPR for daughter [name]. I went over echo results and findings with [name] who verbalized understanding to plan of care for the pt. Advised to keep Dr. Martinique appt 1/8 and Dr. Curt Bears 1/11. Daughter agreeable to plan of care.

## 2016-08-19 NOTE — Progress Notes (Deleted)
Cardiology Office Note:    Date:  08/19/2016   ID:  ALDRIN MA, DOB 1945/10/30, MRN [id number]  PCP:  Philis Fendt, MD  Cardiologist:  Dr. Karrissa Parchment Martinique   Electrophysiologist:  N/a Urologist: Dr. Alinda Money  Referring MD: [name], MD   No chief complaint on file.   History of Present Illness:    [name] is a 71 y.o. male with a hx of NICM, systolic CHF, CKD 3, COPD, diverticulitis with prior perforation s/p colostomy.  He has a hx of severe sepsis in the setting of perforated colon c/b HCAP 2/2 resistant Pseudomonas (Trach eventually placed). EF was 26% by Myoview. LHC at that time demonstrated no CAD.    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  He developed shock and respiratory failure and was intubated. He was supported with vasopressors. Patient was noted to have frequent ectopy and long runs of nonsustained ventricular tachycardia. He was seen by cardiology. He was placed on IV amiodarone as well as IV lidocaine.  Lidocaine was then discontinued.  Echocardiogram demonstrated EF 20-25 with anteroseptal, inferior and inferoseptal akinesis. Given his significant medical illness, he was not felt to be a candidate for cardiac catheterization. With his chronic kidney disease, he was not placed on ACE inhibitor. He was placed on beta blocker with carvedilol. Amiodarone was ultimately discontinued. Of note, his scrotal mass was evaluated by urology and notes indicate he would eventually require surgical excision. Intra-abdominal fluid collection was not felt to be an abscess but fluid-filled bowel. Serum creatinine was 1.74 at discharge.  Here for post  follow up.  He is here today with his daughter. Since DC from the hospital, he has been doing fairly well. He denies significant dyspnea. He denies chest  discomfort, syncope, orthopnea, PND or edema. He has seen urology back in follow-up. Ultimate plan is to proceed with surgical excision of the condylomata. I reviewed the notes from Dr. Alinda Money. Surgical excision would not be a major surgery but there is risk for significant blood loss.  Prior CV studies that were reviewed today include:    UE venous duplex 06/13/16 - No evidence of deep vein thrombosis involving the right upper   extremity and left subclavian vein. - Findings consistent with acute superficial vein thrombosis   involving the right cephalic vein, antecubital fossa to axilla   level.  Echo 06/08/16 Moderate concentric LVH, EF 20-25, diffuse HK, anteroseptal, inferior and inferoseptal akinesis, grade 2 diastolic dysfunction, severe LAE  LHC 7/06 1.  Left ventricle:  118/9/11. 2.  Left main:  Angiographically normal. 3.  LAD:  Moderate size vessel giving rise to a single diagonal, angiographically normal. 4.  Ramus intermedius:  There are two parallel ramus vessels, both are angiographically normal. 5.  Circumflex:  Moderate size vessel giving rise to three obtuse marginals, angiographically normal. 6.  Right coronary artery:  Moderate size dominant vessel.  Angiographically normal. IMPRESSION/RECOMMENDATIONS:  Angiographically normal coronary arteries  Echo: 08/16/16: Study Conclusions  - Left ventricle: The cavity size was mildly dilated. Systolic   function was severely reduced. The estimated ejection fraction   was in the range of 20% to 25%. Severe diffuse hypokinesis with   distinct regional wall motion abnormalities. 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 Features are consistent with a   pseudonormal left ventricular filling pattern, with concomitant   abnormal relaxation and increased filling pressure (grade 2   diastolic dysfunction). -  Mitral valve: There was mild regurgitation. - Left atrium: The atrium was moderately to severely dilated. - Right ventricle: Systolic function was mildly reduced. - Right atrium: The atrium was mildly dilated. - Atrial septum: The septum bowed from left to right, consistent   with increased left atrial pressure. No defect or patent foramen   ovale was identified. - Pulmonary arteries: PA peak pressure: 31 mm Hg (S).  Holter monitor 08/03/16: Study Highlights    Normal sinus rhythm  Sinus brady with lowest rate 48 bpm  Very frequent multiform PVCs with bigeminy, trigeminy and runs of NSVT up to 26 beats.  Occasional PACs   Myoview 08/03/16: Study Highlights     There was no ST segment deviation noted during stress.  This is a high risk study.  The left ventricular ejection fraction is severely decreased (<30%).   LV is severely dilated. There are very frequent multifocal PVCs during the stress test. No evidence for infarct or ischemia. 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       Past Medical History:  Diagnosis Date  . Arthritis   . Chronic kidney disease, stage 3   . Chronic systolic CHF (congestive heart failure) ([place]) 07/04/2016   a. Echo 10/17: Moderate concentric LVH, EF 20-25, diffuse HK, anteroseptal, inferior and inferoseptal akinesis, grade 2 diastolic dysfunction, severe LAE  //  b. Echo 08/16/16: EF 20-25, severe diff HK, inf-lat, inf and inf-septal AK and scarring c/w RCA distribution infarct, Gr 2 DD, mild MR, mod to severe LAE, mild RVE, mild RAE, PASP 31 (Nuc study neg for scar)  . Colon obstruction    Sigmoid  . COPD (chronic obstructive pulmonary disease) ([place])   . Dilated cardiomyopathy ([place])    A. LHC 7/06: normal coronary arteries  //  b. Myoview 12/17: severely dilated LV, frequent multifocal PVCs, EF < 30, no infarct or ischemia (c/w dilated NICM), high risk  . Diverticulitis   . Erectile dysfunction   . Fistula      chronic enterocutaneous  . Gout   . Hypertension   . Inguinal hernia   . NSVT (nonsustained ventricular tachycardia) ([facility])    A. admx in [id number] with metabolic derangement in setting of septic shock - NSVT tx with Amio/Lidocaine; EF 20-25 on echo; Dc'd on beta-blocker (Coreg) only (no Amiodarone)  . Osteoarthritis   . Perforated bowel ([place])    perforation of the cecum  . Peritonitis [facility])     Past Surgical History:  Procedure Laterality Date  . COLON SURGERY      Current Medications: No outpatient prescriptions have been marked as taking for the 08/22/16 encounter (Appointment) with Manasa Spease M Martinique, MD.     Allergies:   Patient has no known allergies.   Social History   Social History  . Marital status: Divorced    Spouse name: N/A  . Number of children: N/A  . Years of education: N/A   Social History Main Topics  . Smoking status: Current Every Day Smoker  . Smokeless tobacco: Never Used  . Alcohol use No  . Drug use: Unknown  . Sexual activity: Not on file   Other Topics Concern  . Not on file   Social History Narrative  . No narrative on file     Family History:  The patient's Family history is unknown by patient.   ROS:   Please see the history of present illness.    Review of Systems  Respiratory: Positive for shortness of breath.    All other systems reviewed and are negative.   EKGs/Labs/Other Test Reviewed:    EKG:  EKG is  ordered today.  The ekg ordered today demonstrates NSR, HR 90, left axis deviation, IVCD, PVCs with one couplet  Recent Labs: 06/14/2016: Magnesium 1.9 06/15/2016: ALT 17; Hemoglobin 7.4; Platelets 99 07/04/2016: BUN 28; Creat 1.55; Potassium 5.1; Sodium 135   Recent Lipid Panel No results found for: CHOL, TRIG, HDL, CHOLHDL, VLDL, LDLCALC, LDLDIRECT   Physical Exam:    VS:  There were no vitals taken for this visit.    Wt Readings from Last 3 Encounters:  08/03/16 169 lb (76.7 kg)  07/04/16 169 lb (76.7 kg)  06/15/16  181 lb 11.2 oz (82.4 kg)     Physical Exam  Constitutional: He is oriented to person, place, and time. He appears well-developed and well-nourished. No distress.  HENT:  Head: Normocephalic and atraumatic.  Eyes: No scleral icterus.  Neck: No JVD present.  Cardiovascular: Normal rate, regular rhythm and normal heart sounds.   No murmur heard. Pulmonary/Chest: Effort normal. He has no wheezes. He has no rales.  Abdominal: Soft. There is no tenderness.  Musculoskeletal: He exhibits no edema.  Neurological: He is alert and oriented to person, place, and time.  Skin: Skin is warm and dry.  Psychiatric: He has a normal mood and affect.    ASSESSMENT:    No diagnosis found. PLAN:    In order of problems listed above:  1. Dilated cardiomyopathy - Previous cardiac catheterization in 2006 demonstrated no coronary artery disease.  Reviewed his case today with Dr. Harrington Challenger, who saw him in the hospital several times prior to discharge.  It has been a long time since his cardiac catheterization. However, we suspect that this continues to be a nonischemic cardiomyopathy. He does need surgical excision of the condylomata on his scrotum. Urology plans to wait several weeks prior to considering with proceeding. After review with Dr. Harrington Challenger, we decided to proceed with noninvasive study. At this time, he is not a candidate for cardiac catheterization. He still has a lot of ectopy on his ECG. Question if PVCs have contributed to his cardiomyopathy.  -  Arrange Lexiscan Myoview - looking for high risk features  -  Titrate beta blocker further - increase Coreg to 6.25 mg twice a day  -  Arrange 24-hour Holter to assess PVC burden in 2-3 weeks  -  Plan repeat echo in 08/2016 to recheck EF  2. Chronic systolic CHF - NYHA 2b.  Volume appears stable on exam today.   -  No ACE inhibitor secondary to CKD  -  Increase Carvedilol as noted   -  Try to add Hydralazine at follow up if blood pressure will tol.  3.  Nonsustained VT - Increase beta-blocker as noted.  4. 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 He will need surgical excision at some point.  Will get Myoview as noted for risk stratificaiton.  5. CKD - Creatinine at discharge 1.74.  Repeat BMET today.   Dispo - FU with me in 3-4 weeks for med titration.  FU with Dr. Arron Mcnaught Martinique in 2 mos - prefer after echocardiogram.  Medication Adjustments/Labs and Tests Ordered: Current medicines are reviewed at length with the patient today.  Concerns regarding medicines are outlined above.  Medication changes, Labs and Tests ordered today are outlined in the Patient Instructions noted below. There are no Patient Instructions on file for this visit. Signed, Babatunde Seago Martinique, MD,FACC 08/19/2016 7:54 AM    [facility] [address], [place], [place]  [address] Phone: [phone]; Fax: [phone]

## 2016-08-22 ENCOUNTER — Ambulatory Visit: Payer: Medicare Other | Admitting: Cardiology

## 2016-08-25 ENCOUNTER — Encounter: Payer: Self-pay | Admitting: Cardiology

## 2016-08-25 ENCOUNTER — Ambulatory Visit (INDEPENDENT_AMBULATORY_CARE_PROVIDER_SITE_OTHER): Payer: Medicare Other | Admitting: Cardiology

## 2016-08-25 VITALS — BP 90/60 | HR 90 | Ht 71.0 in | Wt 171.8 lb

## 2016-08-25 DIAGNOSIS — I5022 Chronic systolic (congestive) heart failure: Secondary | ICD-10-CM

## 2016-08-25 DIAGNOSIS — I472 Ventricular tachycardia: Secondary | ICD-10-CM | POA: Diagnosis not present

## 2016-08-25 DIAGNOSIS — I4729 Other ventricular tachycardia: Secondary | ICD-10-CM

## 2016-08-25 MED ORDER — AMIODARONE HCL 200 MG PO TABS: ORAL_TABLET | ORAL | 0 refills | Status: DC

## 2016-08-25 MED ORDER — LISINOPRIL 5 MG PO TABS: 2.5000 mg | ORAL_TABLET | Freq: Every day | ORAL | 3 refills | Status: DC

## 2016-08-25 MED ORDER — AMIODARONE HCL 200 MG PO TABS: 200.0000 mg | ORAL_TABLET | Freq: Every day | ORAL | 6 refills | Status: DC

## 2016-08-25 NOTE — Patient Instructions (Addendum)
Medication Instructions:    Your physician has recommended you make the following change in your medication:  1) START Lisinopril 2.5 mg once daily 2) START Amiodarone  - take 400 mg twice a day for 2 weeks, then  - take 400 mg once a day for 2 weeks, then  - take 200 mg once daily  --- If you need a refill on your cardiac medications before your next appointment, please call your pharmacy. ---  Labwork:  None ordered  Testing/Procedures:  None ordered  Follow-Up:  Your physician recommends that you schedule a follow-up appointment in: 6 weeks with Dr. Curt Bears  Thank you for choosing [facility]!!   Trinidad Curet, RN [phone]  Any Other Special Instructions Will Be Listed Below (If Applicable).  Amiodarone tablets What is this medicine? AMIODARONE (a MEE oh da rone) is an antiarrhythmic drug. It helps make your heart beat regularly. Because of the side effects caused by this medicine, it is only used when other medicines have not worked. It is usually used for heartbeat problems that may be life threatening. This medicine may be used for other purposes; ask your health care provider or pharmacist if you have questions. COMMON BRAND NAME(S): Cordarone, Pacerone What should I tell my health care provider before I take this medicine? They need to know if you have any of these conditions: -liver disease -lung disease -other heart problems -thyroid disease -an unusual or allergic reaction to amiodarone, iodine, other medicines, foods, dyes, or preservatives -pregnant or trying to get pregnant -breast-feeding How should I use this medicine? Take this medicine by mouth with a glass of water. Follow the directions on the prescription label. You can take this medicine with or without food. However, you should always take it the same way each time. Take your doses at regular intervals. Do not take your medicine more often than directed. Do not stop taking except on the  advice of your doctor or health care professional. A special MedGuide will be given to you by the pharmacist with each prescription and refill. Be sure to read this information carefully each time. Talk to your pediatrician regarding the use of this medicine in children. Special care may be needed. Overdosage: If you think you have taken too much of this medicine contact a poison control center or emergency room at once. NOTE: This medicine is only for you. Do not share this medicine with others. What if I miss a dose? If you miss a dose, take it as soon as you can. If it is almost time for your next dose, take only that dose. Do not take double or extra doses. What may interact with this medicine? Do not take this medicine with any of the following medications: -abarelix -apomorphine -arsenic trioxide -certain antibiotics like erythromycin, gemifloxacin, levofloxacin, pentamidine -certain medicines for depression like amoxapine, tricyclic antidepressants -certain medicines for fungal infections like fluconazole, itraconazole, ketoconazole, posaconazole, voriconazole -certain medicines for irregular heart beat like disopyramide, dofetilide, dronedarone, ibutilide, propafenone, sotalol -certain medicines for malaria like chloroquine, halofantrine -cisapride -droperidol -haloperidol -hawthorn -maprotiline -methadone -phenothiazines like chlorpromazine, mesoridazine, thioridazine -pimozide -ranolazine -red yeast rice -vardenafil -ziprasidone This medicine may also interact with the following medications: -antiviral medicines for HIV or AIDS -certain medicines for blood pressure, heart disease, irregular heart beat -certain medicines for cholesterol like atorvastatin, cerivastatin, lovastatin, simvastatin -certain medicines for hepatitis C like sofosbuvir and ledipasvir; sofosbuvir -certain medicines for seizures like phenytoin -certain medicines for thyroid problems -certain  medicines that treat  or prevent blood clots like warfarin -cholestyramine -cimetidine -clopidogrel -cyclosporine -dextromethorphan -diuretics -fentanyl -general anesthetics -grapefruit juice -lidocaine -loratadine -methotrexate -other medicines that prolong the QT interval (cause an abnormal heart rhythm) -procainamide -quinidine -rifabutin, rifampin, or rifapentine -St. John's Wort -trazodone This list may not describe all possible interactions. Give your health care provider a list of all the medicines, herbs, non-prescription drugs, or dietary supplements you use. Also tell them if you smoke, drink alcohol, or use illegal drugs. Some items may interact with your medicine. What should I watch for while using this medicine? Your condition will be monitored closely when you first begin therapy. Often, this drug is first started in a hospital or other monitored health care setting. Once you are on maintenance therapy, visit your doctor or health care professional for regular checks on your progress. Because your condition and use of this medicine carry some risk, it is a good idea to carry an identification card, necklace or bracelet with details of your condition, medications, and doctor or health care professional. Dennis Bast may get drowsy or dizzy. Do not drive, use machinery, or do anything that needs mental alertness until you know how this medicine affects you. Do not stand or sit up quickly, especially if you are an older patient. This reduces the risk of dizzy or fainting spells. This medicine can make you more sensitive to the sun. Keep out of the sun. If you cannot avoid being in the sun, wear protective clothing and use sunscreen. Do not use sun lamps or tanning beds/booths. You should have regular eye exams before and during treatment. Call your doctor if you have blurred vision, see halos, or your eyes become sensitive to light. Your eyes may get dry. It may be helpful to use a  lubricating eye solution or artificial tears solution. If you are going to have surgery or a procedure that requires contrast dyes, tell your doctor or health care professional that you are taking this medicine. What side effects may I notice from receiving this medicine? Side effects that you should report to your doctor or health care professional as soon as possible: -allergic reactions like skin rash, itching or hives, swelling of the face, lips, or tongue -blue-gray coloring of the skin -blurred vision, seeing blue green halos, increased sensitivity of the eyes to light -breathing problems -chest pain -dark urine -fast, irregular heartbeat -feeling faint or light-headed -intolerance to heat or cold -nausea or vomiting -pain and swelling of the scrotum -pain, tingling, numbness in feet, hands -redness, blistering, peeling or loosening of the skin, including inside the mouth -spitting up blood -stomach pain -sweating -unusual or uncontrolled movements of body -unusually weak or tired -weight gain or loss -yellowing of the eyes or skin Side effects that usually do not require medical attention (report to your doctor or health care professional if they continue or are bothersome): -change in sex drive or performance -constipation -dizziness -headache -loss of appetite -trouble sleeping This list may not describe all possible side effects. Call your doctor for medical advice about side effects. You may report side effects to FDA at 1-800-FDA-1088. Where should I keep my medicine? Keep out of the reach of children. Store at room temperature between 20 and 25 degrees C (68 and 77 degrees F). Protect from light. Keep container tightly closed. Throw away any unused medicine after the expiration date. NOTE: This sheet is a summary. It may not cover all possible information. If you have questions about this medicine, talk to your  doctor, pharmacist, or health care provider.  2017  Elsevier/Gold Standard (2013-11-04 19:48:11)   Lisinopril tablets What is this medicine? LISINOPRIL (lyse IN oh pril) is an ACE inhibitor. This medicine is used to treat high blood pressure and heart failure. It is also used to protect the heart immediately after a heart attack. This medicine may be used for other purposes; ask your health care provider or pharmacist if you have questions. COMMON BRAND NAME(S): Prinivil, Zestril What should I tell my health care provider before I take this medicine? They need to know if you have any of these conditions: -diabetes -heart or blood vessel disease -kidney disease -low blood pressure -previous swelling of the tongue, face, or lips with difficulty breathing, difficulty swallowing, hoarseness, or tightening of the throat -an unusual or allergic reaction to lisinopril, other ACE inhibitors, insect venom, foods, dyes, or preservatives -pregnant or trying to get pregnant -breast-feeding How should I use this medicine? Take this medicine by mouth with a glass of water. Follow the directions on your prescription label. You may take this medicine with or without food. If it upsets your stomach, take it with food. Take your medicine at regular intervals. Do not take it more often than directed. Do not stop taking except on your doctor's advice. Talk to your pediatrician regarding the use of this medicine in children. Special care may be needed. While this drug may be prescribed for children as young as 68 years of age for selected conditions, precautions do apply. Overdosage: If you think you have taken too much of this medicine contact a poison control center or emergency room at once. NOTE: This medicine is only for you. Do not share this medicine with others. What if I miss a dose? If you miss a dose, take it as soon as you can. If it is almost time for your next dose, take only that dose. Do not take double or extra doses. What may interact with this  medicine? Do not take this medicine with any of the following medications: -hymenoptera venom -sacubitril; valsartan This medicines may also interact with the following medications: -aliskiren -angiotensin receptor blockers, like losartan or valsartan -certain medicines for diabetes -diuretics -everolimus -gold compounds -lithium -NSAIDs, medicines for pain and inflammation, like ibuprofen or naproxen -potassium salts or supplements -salt substitutes -sirolimus -temsirolimus This list may not describe all possible interactions. Give your health care provider a list of all the medicines, herbs, non-prescription drugs, or dietary supplements you use. Also tell them if you smoke, drink alcohol, or use illegal drugs. Some items may interact with your medicine. What should I watch for while using this medicine? Visit your doctor or health care professional for regular check ups. Check your blood pressure as directed. Ask your doctor what your blood pressure should be, and when you should contact him or her. Do not treat yourself for coughs, colds, or pain while you are using this medicine without asking your doctor or health care professional for advice. Some ingredients may increase your blood pressure. Women should inform their doctor if they wish to become pregnant or think they might be pregnant. There is a potential for serious side effects to an unborn child. Talk to your health care professional or pharmacist for more information. Check with your doctor or health care professional if you get an attack of severe diarrhea, nausea and vomiting, or if you sweat a lot. The loss of too much body fluid can make it dangerous for you to take this  medicine. You may get drowsy or dizzy. Do not drive, use machinery, or do anything that needs mental alertness until you know how this drug affects you. Do not stand or sit up quickly, especially if you are an older patient. This reduces the risk of dizzy or  fainting spells. Alcohol can make you more drowsy and dizzy. Avoid alcoholic drinks. Avoid salt substitutes unless you are told otherwise by your doctor or health care professional. What side effects may I notice from receiving this medicine? Side effects that you should report to your doctor or health care professional as soon as possible: -allergic reactions like skin rash, itching or hives, swelling of the hands, feet, face, lips, throat, or tongue -breathing problems -signs and symptoms of kidney injury like trouble passing urine or change in the amount of urine -signs and symptoms of increased potassium like muscle weakness; chest pain; or fast, irregular heartbeat -signs and symptoms of liver injury like dark yellow or brown urine; general ill feeling or flu-like symptoms; light-colored stools; loss of appetite; nausea; right upper belly pain; unusually weak or tired; yellowing of the eyes or skin -signs and symptoms of low blood pressure like dizziness; feeling faint or lightheaded, falls; unusually weak or tired -stomach pain with or without nausea and vomiting Side effects that usually do not require medical attention (report to your doctor or health care professional if they continue or are bothersome): -changes in taste -cough -dizziness -fever -headache -sensitivity to light This list may not describe all possible side effects. Call your doctor for medical advice about side effects. You may report side effects to FDA at 1-800-FDA-1088. Where should I keep my medicine? Keep out of the reach of children. Store at room temperature between 15 and 30 degrees C (59 and 86 degrees F). Protect from moisture. Keep container tightly closed. Throw away any unused medicine after the expiration date. NOTE: This sheet is a summary. It may not cover all possible information. If you have questions about this medicine, talk to your doctor, pharmacist, or health care provider.  2017 Elsevier/Gold  Standard (2015-09-21 12:52:35)

## 2016-08-25 NOTE — Progress Notes (Signed)
Electrophysiology Office Note   Date:  08/25/2016   ID:  [name], DOB Sep 27, 1945, MRN [id number]  PCP:  Philis Fendt, MD  Cardiologist:  [place] Primary Electrophysiologist:  Constance Haw, MD    Chief Complaint  Patient presents with  . Advice Only    Chronic systolic CHF      History of Present Illness: [name] is a 71 y.o. male who presents today for electrophysiology evaluation.   Hx NICM, systolic CHF, CKD 3, COPD, diverticulitis with prior perforation s/p colostomy.  He has a hx of severe sepsis in the setting of perforated colon c/b HCAP 2/2 resistant Pseudomonas (Trach eventually placed). EF was 26% by Myoview. LHC at that time demonstrated no CAD.   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  He developed shock and respiratory failure and was intubated. He was supported with vasopressors. Patient was noted to have frequent ectopy and long runs of nonsustained ventricular tachycardia. He was seen by cardiology. He was placed on IV amiodarone as well as IV lidocaine.  Lidocaine was then discontinued.  Echocardiogram demonstrated EF 20-25 with anteroseptal, inferior and inferoseptal akinesis. Given his significant medical illness, he was not felt to be a candidate for cardiac catheterization. With his chronic kidney disease, he was not placed on ACE inhibitor. He was placed on beta blocker with carvedilol. Amiodarone was ultimately discontinued. Of note, his scrotal mass was evaluated by urology and notes indicate he would eventually require surgical excision.  Today, he denies symptoms of palpitations, chest pain, shortness of breath, orthopnea, PND, lower extremity edema, claudication, dizziness, presyncope, syncope, bleeding, or neurologic sequela. The patient is tolerating medications without  difficulties and is otherwise without complaint today.  He is feeling well, only complaining of scrotal pain.   Past Medical History:  Diagnosis Date  . Arthritis   . Chronic kidney disease, stage 3   . Chronic systolic CHF (congestive heart failure) ([place]) 07/04/2016   a. Echo 10/17: Moderate concentric LVH, EF 20-25, diffuse HK, anteroseptal, inferior and inferoseptal akinesis, grade 2 diastolic dysfunction, severe LAE  //  b. Echo 08/16/16: EF 20-25, severe diff HK, inf-lat, inf and inf-septal AK and scarring c/w RCA distribution infarct, Gr 2 DD, mild MR, mod to severe LAE, mild RVE, mild RAE, PASP 31 (Nuc study neg for scar)  . Colon obstruction    Sigmoid  . COPD (chronic obstructive pulmonary disease) ([place])   . Dilated cardiomyopathy ([place])    A. LHC 7/06: normal coronary arteries  //  b. Myoview 12/17: severely dilated LV, frequent multifocal PVCs, EF < 30, no infarct or ischemia (c/w dilated NICM), high risk  . Diverticulitis   . Erectile dysfunction   . Fistula    chronic enterocutaneous  . Gout   . Hypertension   . Inguinal hernia   . NSVT (nonsustained ventricular tachycardia) ([facility])    A. admx in [id number] with metabolic derangement in setting of septic shock - NSVT tx with Amio/Lidocaine; EF 20-25 on echo; Dc'd on beta-blocker (Coreg) only (no Amiodarone)  . Osteoarthritis   . Perforated bowel ([place])    perforation of the cecum  . Peritonitis [facility])    Past Surgical History:  Procedure Laterality Date  . COLON SURGERY       Current Outpatient Prescriptions  Medication Sig Dispense Refill  . albuterol (PROVENTIL) (2.5 MG/3ML)  0.083% nebulizer solution Take 2.5 mg by nebulization every 6 (six) hours as needed for wheezing or shortness of breath.    Marland Kitchen albuterol-ipratropium (COMBIVENT) 18-103 MCG/ACT inhaler Inhale 1 puff into the lungs 2 (two) times daily.    Marland Kitchen allopurinol (ZYLOPRIM) 100 MG tablet Take 100 mg by mouth daily.    . budesonide-formoterol (SYMBICORT) 160-4.5  MCG/ACT inhaler Inhale 2 puffs into the lungs 2 (two) times daily.    . carvedilol (COREG) 6.25 MG tablet Take 1.5 tablets (9.375 mg total) by mouth 2 (two) times daily. 252 tablet 3  . diclofenac sodium (VOLTAREN) 1 % GEL Apply 2 g topically 4 (four) times daily as needed (pain).    . ferrous sulfate 325 (65 FE) MG tablet Take 325 mg by mouth 3 (three) times daily with meals.    . furosemide (LASIX) 40 MG tablet Take 1 tablet (40 mg total) by mouth daily as needed. 30 tablet 0  . nystatin (MYCOSTATIN/NYSTOP) powder Apply topically 3 (three) times daily. To affected area    . RA ASPIRIN EC 81 MG EC tablet Take 81 mg by mouth daily.  1  . tiZANidine (ZANAFLEX) 4 MG tablet Take 4 mg by mouth 2 (two) times daily.     . traMADol (ULTRAM) 50 MG tablet Take 50 mg by mouth 2 (two) times daily as needed for moderate pain or severe pain.     Marland Kitchen amiodarone (PACERONE) 200 MG tablet Take 2 tablets (400 mg total) twice a day for 14 days.  Then decrease and take 2 tablets (400 mg total) once a day for 14 days.  Then start 200 mg once daily. 84 tablet 0  . amiodarone (PACERONE) 200 MG tablet Take 1 tablet (200 mg total) by mouth daily. 30 tablet 6  . lisinopril (PRINIVIL,ZESTRIL) 5 MG tablet Take 0.5 tablets (2.5 mg total) by mouth daily. 45 tablet 3   No current facility-administered medications for this visit.     Allergies:   Patient has no known allergies.   Social History:  The patient  reports that he has been smoking.  He has never used smokeless tobacco. He reports that he does not drink alcohol.   Family History:  The patient's Family history is unknown by patient.    ROS:  Please see the history of present illness.   Otherwise, review of systems is positive for none.   All other systems are reviewed and negative.    PHYSICAL EXAM: VS:  BP 90/60   Pulse 90   Ht 5\' 11"  (1.803 m)   Wt 171 lb 12.8 oz (77.9 kg)   BMI 23.96 kg/m  , BMI Body mass index is 23.96 kg/m. GEN: Well nourished, well  developed, in no acute distress  HEENT: normal  Neck: no JVD, carotid bruits, or masses Cardiac: iRRR; no murmurs, rubs, or gallops,no edema  Respiratory:  clear to auscultation bilaterally, normal work of breathing GI: soft, nontender, nondistended, + BS MS: no deformity or atrophy  Skin: warm and dry Neuro:  Strength and sensation are intact Psych: euthymic mood, full affect  EKG:  EKG is ordered today. Personal review of the ekg ordered shows sinus rhythm, multiple PVCs of different morphologies amiodarone.   Recent Labs: 06/14/2016: Magnesium 1.9 06/15/2016: ALT 17; Hemoglobin 7.4; Platelets 99 07/04/2016: BUN 28; Creat 1.55; Potassium 5.1; Sodium 135    Lipid Panel  No results found for: CHOL, TRIG, HDL, CHOLHDL, VLDL, LDLCALC, LDLDIRECT   Wt Readings from Last 3 Encounters:  08/25/16 171 lb 12.8 oz (77.9 kg)  08/03/16 169 lb (76.7 kg)  07/04/16 169 lb (76.7 kg)      Other studies Reviewed: Additional studies/ records that were reviewed today include: TTE 08/17/15, Holter 08/03/16, Myoview 08/03/16  Review of the above records today demonstrates:  - Left ventricle: The cavity size was mildly dilated. Systolic   function was severely reduced. The estimated ejection fraction   was in the range of 20% to 25%. Severe diffuse hypokinesis with   distinct regional wall motion abnormalities. Akinesis and   scarring of the inferolateral, inferior, and inferoseptal   myocardium; consistent with infarction in the distribution of the   right coronary artery. Features are consistent with a   pseudonormal left ventricular filling pattern, with concomitant   abnormal relaxation and increased filling pressure (grade 2   diastolic dysfunction). - Mitral valve: There was mild regurgitation. - Left atrium: The atrium was moderately to severely dilated. - Right ventricle: Systolic function was mildly reduced. - Right atrium: The atrium was mildly dilated. - Atrial septum: The septum  bowed from left to right, consistent   with increased left atrial pressure. No defect or patent foramen   ovale was identified. - Pulmonary arteries: PA peak pressure: 31 mm Hg (S).   Normal sinus rhythm  Sinus brady with lowest rate 48 bpm  Very frequent multiform PVCs with bigeminy, trigeminy and runs of NSVT up to 26 beats.  Occasional PACs   There was no ST segment deviation noted during stress.  This is a high risk study.  The left ventricular ejection fraction is severely decreased (<30%).   LV is severely dilated. There are very frequent multifocal PVCs during the stress test. No evidence for infarct or ischemia. 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     ASSESSMENT AND PLAN:  1.  Dilated cardiomyopathy: Currently on coreg but not on an ACEI, ARB, or nitrates and hydralazine.  His creatinine had been elevated in the past. Would like to try and decrease PVC burden and get him on OMT prior to ICD therapy.  Starting amiodarone today.   2. Chronic systolic HF: Class 2b symptoms.  [name] try to add hydralazine and nitrates. BP has been limiting in the past. [name] add 2.5 mg lisinopril.  3. Nonsustained [id number] approximately 37% PVCs with nonsustained most recent Holter monitor. It is possible that his dilated cardiomyopathy is somewhat related to hisburden of PVCs. [name] start amiodarone today to see if that [name] possibly decrease his PVC burden. Unfortunately, with multiple morphologies of PVCs, it is unlikely that ablation would be appropriate.   Current medicines are reviewed at length with the patient today.   The patient does not have concerns regarding his medicines.  The following changes were made today:  none  Labs/ tests ordered today include:  Orders Placed This Encounter  Procedures  . EKG 12-Lead     Disposition:   FU with Rania Prothero 6 weeks  Signed, [name] Meredith Leeds, MD  08/25/2016 9:17 AM     [facility] [address] [place] [address] [phone] (office) [phone] (fax)

## 2016-09-22 NOTE — Progress Notes (Signed)
Cardiology Office Note:    Date:  09/23/2016   ID:  JONATHANDAVID Fox, DOB 1946/05/25, MRN [id number]  PCP:  Philis Fendt, MD  Cardiologist:  Dr. Tonia Avino [place]   Electrophysiologist:  N/a Urologist: Dr. Alinda Money  Referring MD: [name], MD   Chief Complaint  Patient presents with  . Follow-up  . Congestive Heart Failure    History of Present Illness:    [name] is a 71 y.o. male with a hx of NICM, systolic CHF, CKD 3, COPD, diverticulitis with prior perforation s/p colostomy.  He has a hx of severe sepsis in the setting of perforated colon c/b HCAP 2/2 resistant Pseudomonas (Trach eventually placed). EF was 26% by Myoview. LHC at that time demonstrated no CAD.    He was admitted 10/24-11/1 with sepsis and ARF (K >7.5, CO2 8, Creatinine 11.31)  possible intra-abdominal abscess.  He developed shock and respiratory failure and was intubated. He was supported with vasopressors. Patient was noted to have frequent ectopy and long runs of nonsustained ventricular tachycardia. He was seen by cardiology. He was placed on IV amiodarone as well as IV lidocaine.  Lidocaine was then discontinued.  Echocardiogram demonstrated EF 20-25 with anteroseptal, inferior and inferoseptal akinesis. Given his significant medical illness, he was not felt to be a candidate for cardiac catheterization. With his chronic kidney disease, he was not placed on ACE inhibitor. He was placed on beta blocker with carvedilol. Amiodarone was ultimately discontinued. 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  Intra-abdominal fluid collection was not felt to be an abscess but fluid-filled bowel. Serum creatinine was 1.74 at discharge.  He was later seen by Richardson Dopp PA. Holter showed frequent PVCs and NSVT with burden of 37%. There was concern that this may be impacting his cardiomyopathy. He was seen by Dr Curt Bears one month ago and placed back on amiodarone. Creatinine  down to 1.55. He felt ablation was not a good option given multiple morphologies of PVCs. Low dose lisinopril was started.   On follow up today he is seen with his daughter. He reports he is doing very well but is tired of seeing doctors. Denies dyspnea, orthopnea, edema, chest pain, palpitations. He does note some lightheadedness with getting up.   Prior CV studies that were reviewed today include:    UE venous duplex 06/13/16 - No evidence of deep vein thrombosis involving the right upper   extremity and left subclavian vein. - Findings consistent with acute superficial vein thrombosis   involving the right cephalic vein, antecubital fossa to axilla   level.  Echo 06/08/16 Moderate concentric LVH, EF 20-25, diffuse HK, anteroseptal, inferior and inferoseptal akinesis, grade 2 diastolic dysfunction, severe LAE  08/16/16: Study Conclusions  - Left ventricle: The cavity size was mildly dilated. Systolic   function was severely reduced. The estimated ejection fraction   was in the range of 20% to 25%. Severe diffuse hypokinesis with   distinct regional wall motion abnormalities. Akinesis and   scarring of the inferolateral, inferior, and inferoseptal   myocardium; consistent with infarction in the distribution of the   right coronary artery. Features are consistent with a   pseudonormal left ventricular filling pattern, with concomitant   abnormal relaxation and increased filling pressure (grade 2   diastolic dysfunction). - Mitral valve: There was mild regurgitation. - Left atrium: The atrium was moderately to severely dilated. - Right ventricle: Systolic function was mildly reduced. - Right  atrium: The atrium was mildly dilated. - Atrial septum: The septum bowed from left to right, consistent   with increased left atrial pressure. No defect or patent foramen   ovale was identified. - Pulmonary arteries: PA peak pressure: 31 mm Hg (S).  LHC 7/06 1.  Left ventricle:  118/9/11. 2.   Left main:  Angiographically normal. 3.  LAD:  Moderate size vessel giving rise to a single diagonal, angiographically normal. 4.  Ramus intermedius:  There are two parallel ramus vessels, both are angiographically normal. 5.  Circumflex:  Moderate size vessel giving rise to three obtuse marginals, angiographically normal. 6.  Right coronary artery:  Moderate size dominant vessel.  Angiographically normal. IMPRESSION/RECOMMENDATIONS:  Angiographically normal coronary arteries  Myoview 08/03/16: Study Highlights     There was no ST segment deviation noted during stress.  This is a high risk study.  The left ventricular ejection fraction is severely decreased (<30%).   LV is severely dilated. There are very frequent multifocal PVCs during the stress test. No evidence for infarct or ischemia. 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 Arthritis   . Chronic kidney disease, stage 3   . Chronic systolic CHF (congestive heart failure) ([place]) 07/04/2016   a. Echo 10/17: Moderate concentric LVH, EF 20-25, diffuse HK, anteroseptal, inferior and inferoseptal akinesis, grade 2 diastolic dysfunction, severe LAE  //  b. Echo 08/16/16: EF 20-25, severe diff HK, inf-lat, inf and inf-septal AK and scarring c/w RCA distribution infarct, Gr 2 DD, mild MR, mod to severe LAE, mild RVE, mild RAE, PASP 31 (Nuc study neg for scar)  . Colon obstruction    Sigmoid  . COPD (chronic obstructive pulmonary disease) ([name])   . Dilated cardiomyopathy ([name])    A. LHC 7/06: normal coronary arteries  //  b. Myoview 12/17: severely dilated LV, frequent multifocal PVCs, EF < 30, no infarct or ischemia (c/w dilated NICM), high risk  . Diverticulitis   . Erectile dysfunction   . Fistula    chronic enterocutaneous  . Gout   . Hypertension   . Inguinal hernia   . NSVT (nonsustained ventricular tachycardia) (H[facility])    A. admx in 123XX123 with metabolic  derangement in setting of septic shock - NSVT tx with Amio/Lidocaine; EF 20-25 on echo; Dc'd on beta-blocker (Coreg) only (no Amiodarone)  . Osteoarthritis   . Perforated bowel ([name])    perforation of the cecum  . Peritonitis [facility])     Past Surgical History:  Procedure Laterality Date  . COLON SURGERY      Current Medications: Current Meds  Medication Sig  . albuterol (PROVENTIL) (2.5 MG/3ML) 0.083% nebulizer solution Take 2.5 mg by nebulization every 6 (six) hours as needed for wheezing or shortness of breath.  Marland Kitchen albuterol-ipratropium (COMBIVENT) 18-103 MCG/ACT inhaler Inhale 1 puff into the lungs 2 (two) times daily.  Marland Kitchen allopurinol (ZYLOPRIM) 100 MG tablet Take 100 mg by mouth daily.  Marland Kitchen amiodarone (PACERONE) 200 MG tablet Take 1 tablet (200 mg total) by mouth daily.  . carvedilol (COREG) 6.25 MG tablet Take 1.5 tablets (9.375 mg total) by mouth 2 (two) times daily.  . diclofenac sodium (VOLTAREN) 1 % GEL Apply 2 g topically 4 (four) times daily as needed (pain).  . ferrous sulfate 325 (65 FE) MG tablet Take 325 mg by mouth 3 (three) times daily with meals.  . furosemide (LASIX) 40 MG tablet Take 1 tablet (40 mg total)  by mouth daily as needed.  . nystatin (MYCOSTATIN/NYSTOP) powder Apply topically 3 (three) times daily. To affected area  . RA ASPIRIN EC 81 MG EC tablet Take 81 mg by mouth daily.  Marland Kitchen tiZANidine (ZANAFLEX) 4 MG tablet Take 4 mg by mouth 2 (two) times daily.   . traMADol (ULTRAM) 50 MG tablet Take 50 mg by mouth 2 (two) times daily as needed for moderate pain or severe pain.   . [DISCONTINUED] lisinopril (PRINIVIL,ZESTRIL) 5 MG tablet Take 0.5 tablets (2.5 mg total) by mouth daily.     Allergies:   Patient has no known allergies.   Social History   Social History  . Marital status: Divorced    Spouse name: N/A  . Number of children: N/A  . Years of education: N/A   Social History Main Topics  . Smoking status: Current Every Day Smoker  . Smokeless tobacco:  Never Used  . Alcohol use No  . Drug use: Unknown  . Sexual activity: Not Asked   Other Topics Concern  . None   Social History Narrative  . None     Family History:  The patient's Family history is unknown by patient.   ROS:   Please see the history of present illness.    Review of Systems  Neurological: Positive for light-headedness.   All other systems reviewed and are negative.   EKGs/Labs/Other Test Reviewed:    EKG:  EKG is not  ordered today.    Recent Labs: 06/14/2016: Magnesium 1.9 06/15/2016: ALT 17; Hemoglobin 7.4; Platelets 99 07/04/2016: BUN 28; Creat 1.55; Potassium 5.1; Sodium 135   Recent Lipid Panel No results found for: CHOL, TRIG, HDL, CHOLHDL, VLDL, LDLCALC, LDLDIRECT   Physical Exam:    VS:  BP (!) 85/45   Pulse 79   Ht 5\' 11"  (1.803 m)   Wt 175 lb 6.4 oz (79.6 kg)   BMI 24.46 kg/m   I repeated BP manually and got 76/46 bilaterally.  Wt Readings from Last 3 Encounters:  09/23/16 175 lb 6.4 oz (79.6 kg)  08/25/16 171 lb 12.8 oz (77.9 kg)  08/03/16 169 lb (76.7 kg)     Physical Exam  Constitutional: He is oriented to person, place, and time. He appears well-developed and well-nourished. No distress.  HENT:  Head: Normocephalic and atraumatic.  Eyes: No scleral icterus.  Neck: No JVD present.  Cardiovascular: Normal rate, regular rhythm and normal heart sounds.   No murmur heard. Pulmonary/Chest: Effort normal. He has no wheezes. He has no rales.  Abdominal: Soft. There is no tenderness.  Colostomy in place   Musculoskeletal: He exhibits no edema.  Neurological: He is alert and oriented to person, place, and time.  Skin: Skin is warm and dry.  Psychiatric: He has a normal mood and affect.    ASSESSMENT:    1. Chronic systolic CHF (congestive heart failure) ([place])   2. NSVT (nonsustained ventricular tachycardia) ([place])   3. Dilated cardiomyopathy ([place])    PLAN:    In order of problems listed above:  1. Dilated cardiomyopathy -  Myoview showed normal perfusion.  Recommend continued lasix and Coreg 6.25 mg bid. BP is too low on lisinopril 2.5 mg daily. Recommend he stop lisinopril. Fortunately he is really class 1-2.   2. Chronic systolic CHF -.  Volume appears stable on exam today.   -  No ACE inhibitor secondary to CKD and hypotension  -   Carvedilol at same dose   -  I don't  think he will tolerate hydalazine/nitrates due to hypotension.  3. Nonsustained VT - Increase beta-blocker as noted. Now on amiodarone per EP. Follow up with Dr. Curt Bears. Consider repeat Holter at some point to see if amiodarone is effective in arrhythmia suppression.  4.  CKD - stage 3  Medication Adjustments/Labs and Tests Ordered: Current medicines are reviewed at length with the patient today.  Concerns regarding medicines are outlined above.  Medication changes, Labs and Tests ordered today are outlined in the Patient Instructions noted below. Patient Instructions  Stop taking lisinopril  Continue all your other medication  I will see you in 4 months    Signed, Rosalind Guido [place], MD  09/23/2016 4:26 PM    [place]

## 2016-09-23 ENCOUNTER — Ambulatory Visit (INDEPENDENT_AMBULATORY_CARE_PROVIDER_SITE_OTHER): Payer: Medicare Other | Admitting: Cardiology

## 2016-09-23 ENCOUNTER — Encounter: Payer: Self-pay | Admitting: Cardiology

## 2016-09-23 VITALS — BP 85/45 | HR 79 | Ht 71.0 in | Wt 175.4 lb

## 2016-09-23 DIAGNOSIS — I420 Dilated cardiomyopathy: Secondary | ICD-10-CM | POA: Diagnosis not present

## 2016-09-23 DIAGNOSIS — I472 Ventricular tachycardia: Secondary | ICD-10-CM

## 2016-09-23 DIAGNOSIS — I5022 Chronic systolic (congestive) heart failure: Secondary | ICD-10-CM | POA: Diagnosis not present

## 2016-09-23 DIAGNOSIS — I4729 Other ventricular tachycardia: Secondary | ICD-10-CM

## 2016-09-23 NOTE — Patient Instructions (Signed)
Stop taking lisinopril  Continue all your other medication  I will see you in 4 months

## 2016-10-04 ENCOUNTER — Encounter (INDEPENDENT_AMBULATORY_CARE_PROVIDER_SITE_OTHER): Payer: Self-pay

## 2016-10-04 ENCOUNTER — Ambulatory Visit (INDEPENDENT_AMBULATORY_CARE_PROVIDER_SITE_OTHER): Payer: Medicare Other | Admitting: Cardiology

## 2016-10-04 ENCOUNTER — Encounter: Payer: Self-pay | Admitting: Cardiology

## 2016-10-04 VITALS — BP 104/66 | HR 56 | Ht 71.0 in | Wt 178.8 lb

## 2016-10-04 DIAGNOSIS — I493 Ventricular premature depolarization: Secondary | ICD-10-CM

## 2016-10-04 DIAGNOSIS — I4729 Other ventricular tachycardia: Secondary | ICD-10-CM

## 2016-10-04 DIAGNOSIS — I472 Ventricular tachycardia: Secondary | ICD-10-CM

## 2016-10-04 DIAGNOSIS — I5022 Chronic systolic (congestive) heart failure: Secondary | ICD-10-CM | POA: Diagnosis not present

## 2016-10-04 NOTE — Patient Instructions (Addendum)
Medication Instructions:  Your physician recommends that you continue on your current medications as directed. Please refer to the Current Medication list given to you today.   Labwork: None Ordered   Testing/Procedures: Your physician has recommended that you wear a 48 hour holter monitor. Holter monitors are medical devices that record the heart's electrical activity. Doctors most often use these monitors to diagnose arrhythmias. Arrhythmias are problems with the speed or rhythm of the heartbeat. The monitor is a small, portable device. You can wear one while you do your normal daily activities. This is usually used to diagnose what is causing palpitations/syncope (passing out).     Follow-Up: Your physician recommends that you schedule a follow-up appointment in: 3 months with Dr. Curt Bears    Any Other Special Instructions Will Be Listed Below (If Applicable).     If you need a refill on your cardiac medications before your next appointment, please call your pharmacy.

## 2016-10-04 NOTE — Progress Notes (Signed)
Electrophysiology Office Note   Date:  10/04/2016   ID:  [name], DOB 03-18-1946, MRN [id number]  PCP:  Philis Fendt, MD  Cardiologist:  [place] Primary Electrophysiologist:  Constance Haw, MD    Chief Complaint  Patient presents with  . Follow-up    PVC's/Chronic Systolic CHF     History of Present Illness: [name] is a 71 y.o. male who presents today for electrophysiology evaluation.   Hx NICM, systolic CHF, CKD 3, COPD, diverticulitis with prior perforation s/p colostomy.  He has a hx of severe sepsis in the setting of perforated colon c/b HCAP 2/2 resistant Pseudomonas (Trach eventually placed). EF was 26% by Myoview. LHC at that time demonstrated no CAD.   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  He developed shock and respiratory failure and was intubated. He was supported with vasopressors. Patient was noted to have frequent ectopy and long runs of nonsustained ventricular tachycardia. He was seen by cardiology. He was placed on IV amiodarone as well as IV lidocaine.  Lidocaine was then discontinued.  Echocardiogram demonstrated EF 20-25 with anteroseptal, inferior and inferoseptal akinesis. Given his significant medical illness, he was not felt to be a candidate for cardiac catheterization. With his chronic kidney disease, he was not placed on ACE inhibitor. He was placed on beta blocker with carvedilol. Amiodarone was ultimately discontinued. Of note, his scrotal mass was evaluated by urology and notes indicate he would eventually require surgical excision.  Today, he denies symptoms of palpitations, chest pain, shortness of breath, orthopnea, PND, lower extremity edema, claudication, dizziness, presyncope, syncope, bleeding, or neurologic sequela. The patient is tolerating medications  without difficulties and is otherwise without complaint today.  He does not notice any palpitations, shortness of breath, or fatigue.   Past Medical History:  Diagnosis Date  . Arthritis   . Chronic kidney disease, stage 3   . Chronic systolic CHF (congestive heart failure) ([place]) 07/04/2016   a. Echo 10/17: Moderate concentric LVH, EF 20-25, diffuse HK, anteroseptal, inferior and inferoseptal akinesis, grade 2 diastolic dysfunction, severe LAE  //  b. Echo 08/16/16: EF 20-25, severe diff HK, inf-lat, inf and inf-septal AK and scarring c/w RCA distribution infarct, Gr 2 DD, mild MR, mod to severe LAE, mild RVE, mild RAE, PASP 31 (Nuc study neg for scar)  . Colon obstruction    Sigmoid  . COPD (chronic obstructive pulmonary disease) ([place])   . Dilated cardiomyopathy ([place])    A. LHC 7/06: normal coronary arteries  //  b. Myoview 12/17: severely dilated LV, frequent multifocal PVCs, EF < 30, no infarct or ischemia (c/w dilated NICM), high risk  . Diverticulitis   . Erectile dysfunction   . Fistula    chronic enterocutaneous  . Gout   . Hypertension   . Inguinal hernia   . NSVT (nonsustained ventricular tachycardia) ([facility])    A. admx in 123XX123 with metabolic derangement in setting of septic shock - NSVT tx with Amio/Lidocaine; EF 20-25 on echo; Dc'd on beta-blocker (Coreg) only (no Amiodarone)  . Osteoarthritis   . Perforated bowel ([place])    perforation of the cecum  . Peritonitis [facility])    Past Surgical History:  Procedure Laterality Date  . COLON SURGERY       Current Outpatient Prescriptions  Medication Sig Dispense Refill  . albuterol (PROVENTIL) (2.5 MG/3ML)  0.083% nebulizer solution Take 2.5 mg by nebulization every 6 (six) hours as needed for wheezing or shortness of breath.    Marland Kitchen albuterol-ipratropium (COMBIVENT) 18-103 MCG/ACT inhaler Inhale 1 puff into the lungs 2 (two) times daily.    Marland Kitchen allopurinol (ZYLOPRIM) 100 MG tablet Take 100 mg by mouth daily.    Marland Kitchen amiodarone (PACERONE)  200 MG tablet Take 1 tablet (200 mg total) by mouth daily. 30 tablet 6  . carvedilol (COREG) 6.25 MG tablet Take 1.5 tablets (9.375 mg total) by mouth 2 (two) times daily. 252 tablet 3  . diclofenac sodium (VOLTAREN) 1 % GEL Apply 2 g topically 4 (four) times daily as needed (pain).    . ferrous sulfate 325 (65 FE) MG tablet Take 325 mg by mouth 3 (three) times daily with meals.    . furosemide (LASIX) 40 MG tablet Take 1 tablet (40 mg total) by mouth daily as needed. 30 tablet 0  . nystatin (MYCOSTATIN/NYSTOP) powder Apply topically 3 (three) times daily. To affected area    . RA ASPIRIN EC 81 MG EC tablet Take 81 mg by mouth daily.  1  . tiZANidine (ZANAFLEX) 4 MG tablet Take 4 mg by mouth 2 (two) times daily.     . traMADol (ULTRAM) 50 MG tablet Take 50 mg by mouth 2 (two) times daily as needed for moderate pain or severe pain.      No current facility-administered medications for this visit.     Allergies:   Patient has no known allergies.   Social History:  The patient  reports that he has been smoking.  He has never used smokeless tobacco. He reports that he does not drink alcohol.   Family History:  The patient's Family history is unknown by patient.    ROS:  Please see the history of present illness.   Otherwise, review of systems is positive for weight change, chills, chest pressure, leg pain, palpitations, co ugh, balance problems, difficulty urinating, bleeding.   All other systems are reviewed and negative.    PHYSICAL EXAM: VS:  BP 104/66   Pulse (!) 56   Ht 5\' 11"  (1.803 m)   Wt 178 lb 12.8 oz (81.1 kg)   BMI 24.94 kg/m  , BMI Body mass index is 24.94 kg/m. GEN: Well nourished, well developed, in no acute distress  HEENT: normal  Neck: no JVD, carotid bruits, or masses Cardiac: RRR; no murmurs, rubs, or gallops,no edema  Respiratory:  clear to auscultation bilaterally, normal work of breathing GI: soft, nontender, nondistended, + BS MS: no deformity or atrophy    Skin: warm and dry Neuro:  Strength and sensation are intact Psych: euthymic mood, full affect  EKG:  EKG is ordered today. Personal review of the ekg ordered shows sinus rhythm, multiple PVCs of different morphologies   Recent Labs: 06/14/2016: Magnesium 1.9 06/15/2016: ALT 17; Hemoglobin 7.4; Platelets 99 07/04/2016: BUN 28; Creat 1.55; Potassium 5.1; Sodium 135    Lipid Panel  No results found for: CHOL, TRIG, HDL, CHOLHDL, VLDL, LDLCALC, LDLDIRECT   Wt Readings from Last 3 Encounters:  10/04/16 178 lb 12.8 oz (81.1 kg)  09/23/16 175 lb 6.4 oz (79.6 kg)  08/25/16 171 lb 12.8 oz (77.9 kg)      Other studies Reviewed: Additional studies/ records that were reviewed today include: TTE 08/17/15, Holter 08/03/16, Myoview 08/03/16  Review of the above records today demonstrates:  - Left ventricle: The cavity size was mildly dilated. Systolic   function was  severely reduced. The estimated ejection fraction   was in the range of 20% to 25%. Severe diffuse hypokinesis with   distinct regional wall motion abnormalities. Akinesis and   scarring of the inferolateral, inferior, and inferoseptal   myocardium; consistent with infarction in the distribution of the   right coronary artery. Features are consistent with a   pseudonormal left ventricular filling pattern, with concomitant   abnormal relaxation and increased filling pressure (grade 2   diastolic dysfunction). - Mitral valve: There was mild regurgitation. - Left atrium: The atrium was moderately to severely dilated. - Right ventricle: Systolic function was mildly reduced. - Right atrium: The atrium was mildly dilated. - Atrial septum: The septum bowed from left to right, consistent   with increased left atrial pressure. No defect or patent foramen   ovale was identified. - Pulmonary arteries: PA peak pressure: 31 mm Hg (S).   Normal sinus rhythm  Sinus brady with lowest rate 48 bpm  Very frequent multiform PVCs with  bigeminy, trigeminy and runs of NSVT up to 26 beats.  Occasional PACs   There was no ST segment deviation noted during stress.  This is a high risk study.  The left ventricular ejection fraction is severely decreased (<30%).   LV is severely dilated. There are very frequent multifocal PVCs during the stress test. No evidence for infarct or ischemia. 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     ASSESSMENT AND PLAN:  1.  Dilated cardiomyopathy: Currently on coreg but not on an ACEI, ARB, or nitrates and hydralazine.  His creatinine had been elevated in the past. Would like to try and decrease PVC burden and get him on OMT prior to ICD therapy.  Not having any issues from his amiodarone therapy. Arjay Jaskiewicz order a 48 hour monitor to see if his PVC burden has decreased.  2. Chronic systolic HF: Class 2b symptoms.  Unfortunately he has not tolerated blood pressure medications passed his carvedilol.  3. Nonsustained [id number] approximately 37% PVCs with nonsustained most recent Holter monitor. It is possible that his dilated cardiomyopathy is somewhat related to hisburden of PVCs. Tolerating his amiodarone. We'll order a 48 hour monitor to further determine if his amiodarone is suppressing his PVCs.   Current medicines are reviewed at length with the patient today.   The patient does not have concerns regarding his medicines.  The following changes were made today:  none  Labs/ tests ordered today include:  Orders Placed This Encounter  Procedures  . Holter monitor - 48 hour     Disposition:   FU with Chaos Carlile 3 months  Signed, Delainie Chavana Meredith Leeds, MD  10/04/2016 11:55 AM     [facility] [address] [place] [address] [phone] (office) [phone] (fax)

## 2016-10-10 ENCOUNTER — Ambulatory Visit (INDEPENDENT_AMBULATORY_CARE_PROVIDER_SITE_OTHER): Payer: Medicare Other

## 2016-10-10 DIAGNOSIS — I493 Ventricular premature depolarization: Secondary | ICD-10-CM

## 2016-11-16 ENCOUNTER — Encounter (HOSPITAL_COMMUNITY): Payer: Self-pay

## 2016-11-16 ENCOUNTER — Emergency Department (HOSPITAL_COMMUNITY): Payer: Medicare Other

## 2016-11-16 ENCOUNTER — Inpatient Hospital Stay (HOSPITAL_COMMUNITY)
Admission: EM | Admit: 2016-11-16 | Discharge: 2016-11-20 | DRG: 729 | Disposition: A | Discharge status: Home / Self Care | Payer: Medicare Other | Attending: Internal Medicine | Admitting: Internal Medicine

## 2016-11-16 DIAGNOSIS — I130 Hypertensive heart and chronic kidney disease with heart failure and stage 1 through stage 4 chronic kidney disease, or unspecified chronic kidney disease: Secondary | ICD-10-CM | POA: Diagnosis present

## 2016-11-16 DIAGNOSIS — A630 Anogenital (venereal) warts: Secondary | ICD-10-CM | POA: Diagnosis present

## 2016-11-16 DIAGNOSIS — J4489 Other specified chronic obstructive pulmonary disease: Secondary | ICD-10-CM

## 2016-11-16 DIAGNOSIS — I5022 Chronic systolic (congestive) heart failure: Secondary | ICD-10-CM | POA: Diagnosis present

## 2016-11-16 DIAGNOSIS — I255 Ischemic cardiomyopathy: Secondary | ICD-10-CM

## 2016-11-16 DIAGNOSIS — I959 Hypotension, unspecified: Secondary | ICD-10-CM | POA: Diagnosis present

## 2016-11-16 DIAGNOSIS — N184 Chronic kidney disease, stage 4 (severe): Secondary | ICD-10-CM

## 2016-11-16 DIAGNOSIS — N509 Disorder of male genital organs, unspecified: Secondary | ICD-10-CM

## 2016-11-16 DIAGNOSIS — N179 Acute kidney failure, unspecified: Secondary | ICD-10-CM | POA: Diagnosis present

## 2016-11-16 DIAGNOSIS — R531 Weakness: Secondary | ICD-10-CM | POA: Diagnosis present

## 2016-11-16 DIAGNOSIS — J449 Chronic obstructive pulmonary disease, unspecified: Secondary | ICD-10-CM | POA: Diagnosis present

## 2016-11-16 DIAGNOSIS — D500 Iron deficiency anemia secondary to blood loss (chronic): Secondary | ICD-10-CM | POA: Diagnosis present

## 2016-11-16 DIAGNOSIS — N433 Hydrocele, unspecified: Secondary | ICD-10-CM | POA: Diagnosis present

## 2016-11-16 DIAGNOSIS — F17210 Nicotine dependence, cigarettes, uncomplicated: Secondary | ICD-10-CM | POA: Diagnosis present

## 2016-11-16 DIAGNOSIS — N5089 Other specified disorders of the male genital organs: Secondary | ICD-10-CM | POA: Diagnosis present

## 2016-11-16 DIAGNOSIS — Z933 Colostomy status: Secondary | ICD-10-CM

## 2016-11-16 DIAGNOSIS — D649 Anemia, unspecified: Secondary | ICD-10-CM | POA: Diagnosis not present

## 2016-11-16 DIAGNOSIS — I420 Dilated cardiomyopathy: Secondary | ICD-10-CM | POA: Diagnosis present

## 2016-11-16 DIAGNOSIS — I472 Ventricular tachycardia: Secondary | ICD-10-CM | POA: Diagnosis present

## 2016-11-16 DIAGNOSIS — I509 Heart failure, unspecified: Secondary | ICD-10-CM

## 2016-11-16 HISTORY — DX: Other specified disorders of the male genital organs: N50.89

## 2016-11-16 LAB — URINALYSIS, ROUTINE W REFLEX MICROSCOPIC
BILIRUBIN URINE: NEGATIVE
Glucose, UA: NEGATIVE mg/dL
HGB URINE DIPSTICK: NEGATIVE
KETONES UR: NEGATIVE mg/dL
Leukocytes, UA: NEGATIVE
Nitrite: NEGATIVE
PROTEIN: NEGATIVE mg/dL
Specific Gravity, Urine: 1.01 (ref 1.005–1.030)
pH: 5 (ref 5.0–8.0)

## 2016-11-16 LAB — I-STAT CHEM 8, ED
BUN: 34 mg/dL — AB (ref 6–20)
CALCIUM ION: 1.18 mmol/L (ref 1.15–1.40)
CHLORIDE: 104 mmol/L (ref 101–111)
Creatinine, Ser: 2.9 mg/dL — ABNORMAL HIGH (ref 0.61–1.24)
GLUCOSE: 109 mg/dL — AB (ref 65–99)
HCT: 22 % — ABNORMAL LOW (ref 39.0–52.0)
Hemoglobin: 7.5 g/dL — ABNORMAL LOW (ref 13.0–17.0)
POTASSIUM: 4 mmol/L (ref 3.5–5.1)
SODIUM: 137 mmol/L (ref 135–145)
TCO2: 23 mmol/L (ref 0–100)

## 2016-11-16 LAB — CBC
HCT: 22.9 % — ABNORMAL LOW (ref 39.0–52.0)
HEMOGLOBIN: 6.2 g/dL — AB (ref 13.0–17.0)
MCH: 21 pg — ABNORMAL LOW (ref 26.0–34.0)
MCHC: 27.1 g/dL — AB (ref 30.0–36.0)
MCV: 77.6 fL — ABNORMAL LOW (ref 78.0–100.0)
Platelets: 258 10*3/uL (ref 150–400)
RBC: 2.95 MIL/uL — ABNORMAL LOW (ref 4.22–5.81)
RDW: 22.9 % — AB (ref 11.5–15.5)
WBC: 5.8 10*3/uL (ref 4.0–10.5)

## 2016-11-16 LAB — PROTIME-INR
INR: 1.15
PROTHROMBIN TIME: 14.7 s (ref 11.4–15.2)

## 2016-11-16 LAB — COMPREHENSIVE METABOLIC PANEL
ALBUMIN: 2.2 g/dL — AB (ref 3.5–5.0)
ALK PHOS: 147 U/L — AB (ref 38–126)
ALT: 19 U/L (ref 17–63)
ANION GAP: 8 (ref 5–15)
AST: 24 U/L (ref 15–41)
BILIRUBIN TOTAL: 0.9 mg/dL (ref 0.3–1.2)
BUN: 37 mg/dL — ABNORMAL HIGH (ref 6–20)
CALCIUM: 8.2 mg/dL — AB (ref 8.9–10.3)
CO2: 22 mmol/L (ref 22–32)
Chloride: 105 mmol/L (ref 101–111)
Creatinine, Ser: 2.85 mg/dL — ABNORMAL HIGH (ref 0.61–1.24)
GFR calc Af Amer: 24 mL/min — ABNORMAL LOW (ref >60)
GFR calc non Af Amer: 21 mL/min — ABNORMAL LOW (ref >60)
Glucose, Bld: 112 mg/dL — ABNORMAL HIGH (ref 65–99)
Potassium: 4 mmol/L (ref 3.5–5.1)
Sodium: 135 mmol/L (ref 135–145)
TOTAL PROTEIN: 7 g/dL (ref 6.5–8.1)

## 2016-11-16 LAB — ABO/RH: ABO/RH(D): O POS

## 2016-11-16 LAB — BRAIN NATRIURETIC PEPTIDE: B NATRIURETIC PEPTIDE 5: 491.1 pg/mL — AB (ref 0.0–100.0)

## 2016-11-16 LAB — PREPARE RBC (CROSSMATCH)

## 2016-11-16 LAB — I-STAT CG4 LACTIC ACID, ED: LACTIC ACID, VENOUS: 1.68 mmol/L (ref 0.5–1.9)

## 2016-11-16 MED ORDER — SODIUM CHLORIDE 0.9 % IV SOLN: 10.0000 mL/h | Freq: Once | INTRAVENOUS | Status: DC

## 2016-11-16 MED ORDER — TRAMADOL HCL 50 MG PO TABS
50.0000 mg | ORAL_TABLET | Freq: Two times a day (BID) | ORAL | Status: DC
  Administered 2016-11-16 – 2016-11-20 (×8): 50 mg via ORAL
  Filled 2016-11-16 (×9): qty 1

## 2016-11-16 MED ORDER — IPRATROPIUM-ALBUTEROL 0.5-2.5 (3) MG/3ML IN SOLN: 3.0000 mL | Freq: Four times a day (QID) | RESPIRATORY_TRACT | Status: DC | PRN

## 2016-11-16 MED ORDER — IPRATROPIUM-ALBUTEROL 20-100 MCG/ACT IN AERS: 1.0000 | INHALATION_SPRAY | Freq: Four times a day (QID) | RESPIRATORY_TRACT | Status: DC | PRN

## 2016-11-16 MED ORDER — FERROUS SULFATE 325 (65 FE) MG PO TABS
325.0000 mg | ORAL_TABLET | Freq: Three times a day (TID) | ORAL | Status: DC
  Administered 2016-11-17 – 2016-11-20 (×9): 325 mg via ORAL
  Filled 2016-11-16 (×9): qty 1

## 2016-11-16 MED ORDER — CARVEDILOL 3.125 MG PO TABS
3.1250 mg | ORAL_TABLET | Freq: Two times a day (BID) | ORAL | Status: DC
  Administered 2016-11-17: 3.125 mg via ORAL
  Filled 2016-11-16 (×2): qty 1

## 2016-11-16 MED ORDER — AMIODARONE HCL 200 MG PO TABS
200.0000 mg | ORAL_TABLET | Freq: Every day | ORAL | Status: DC
  Administered 2016-11-17 – 2016-11-20 (×4): 200 mg via ORAL
  Filled 2016-11-16 (×5): qty 1

## 2016-11-16 MED ORDER — SODIUM CHLORIDE 0.9 % IV SOLN
INTRAVENOUS | Status: DC
  Administered 2016-11-16: 16:00:00 via INTRAVENOUS

## 2016-11-16 MED ORDER — IMIQUIMOD 5 % EX CREA
1.0000 "application " | TOPICAL_CREAM | Freq: Every day | CUTANEOUS | Status: DC
  Administered 2016-11-17 – 2016-11-18 (×2): 1 via TOPICAL

## 2016-11-16 MED ORDER — TIZANIDINE HCL 4 MG PO TABS
4.0000 mg | ORAL_TABLET | Freq: Two times a day (BID) | ORAL | Status: DC | PRN
  Administered 2016-11-17 (×2): 4 mg via ORAL
  Filled 2016-11-16 (×2): qty 1

## 2016-11-16 MED ORDER — SODIUM CHLORIDE 0.9% FLUSH
3.0000 mL | Freq: Two times a day (BID) | INTRAVENOUS | Status: DC
  Administered 2016-11-17 – 2016-11-19 (×6): 3 mL via INTRAVENOUS

## 2016-11-16 MED ORDER — SODIUM CHLORIDE 0.9 % IV BOLUS (SEPSIS)
250.0000 mL | Freq: Once | INTRAVENOUS | Status: AC
  Administered 2016-11-16: 250 mL via INTRAVENOUS

## 2016-11-16 MED ORDER — ALBUTEROL SULFATE (2.5 MG/3ML) 0.083% IN NEBU: 2.5000 mg | INHALATION_SOLUTION | Freq: Four times a day (QID) | RESPIRATORY_TRACT | Status: DC | PRN

## 2016-11-16 NOTE — ED Notes (Signed)
Consent form sent upstairs with patient.

## 2016-11-16 NOTE — ED Triage Notes (Signed)
PT RECEIVED FROM HIS PCP OFFICE FOR HYPOTENSION. PT WENT TO HIS PCP TODAY FOR EVALUATION OF A SCROTAL MASS THAT HAS BEEN BLEEDING X3 WEEKS. PER EMS, WHEN THE OFFICE TOOK HIS BP IT WAS 90/50, HR 58. PT STS HE IS WAITING TO GET THE MASS REMOVED WHEN HE IS CLEARED BY HIS CARDIOLOGIST. EMS VITALS 89/52, HE 67, O2 95%, CBG 135.

## 2016-11-16 NOTE — ED Provider Notes (Signed)
[facility] DEPT Provider Note   CSN: [id number] Arrival date & time: 11/16/16  1357     History   Chief Complaint Chief Complaint  Patient presents with  . Groin Swelling    HPI [name] is a 71 y.o. male.  The patient sent in from [place] office. For low blood pressures. Patient has known chronic systolic congestive heart failure and ischemic cardiomyopathy. Patient's blood pressures are normally in the low 90s. Followed by Dr. Martinique to cardiology. Patient also with long-standing scrotal wall mass with on and off bleeding problems since in the fall. Patient states bleeding is increased from that mass in the past 2 weeks in the last 3 days she's felt lightheaded when he stood up. Patient denies any significant shortness of breath or chest pain. Patient states that the scrotal masses been bleeding on and off since the fall but has been heavier as stated above in the past 2 weeks.      Past Medical History:  Diagnosis Date  . Arthritis   . Chronic kidney disease, stage 3   . Chronic systolic CHF (congestive heart failure) ([place]) 07/04/2016   a. Echo 10/17: Moderate concentric LVH, EF 20-25, diffuse HK, anteroseptal, inferior and inferoseptal akinesis, grade 2 diastolic dysfunction, severe LAE  //  b. Echo 08/16/16: EF 20-25, severe diff HK, inf-lat, inf and inf-septal AK and scarring c/w RCA distribution infarct, Gr 2 DD, mild MR, mod to severe LAE, mild RVE, mild RAE, PASP 31 (Nuc study neg for scar)  . Colon obstruction    Sigmoid  . COPD (chronic obstructive pulmonary disease) ([place])   . Dilated cardiomyopathy ([place])    A. LHC 7/06: normal coronary arteries  //  b. Myoview 12/17: severely dilated LV, frequent multifocal PVCs, EF < 30, no infarct or ischemia (c/w dilated NICM), high risk  . Diverticulitis   . Erectile dysfunction   . Fistula    chronic enterocutaneous  . Gout   . Hypertension   . Inguinal hernia   . NSVT (nonsustained ventricular tachycardia) ([facility])     A. admx in 25/05 with metabolic derangement in setting of septic shock - NSVT tx with Amio/Lidocaine; EF 20-25 on echo; Dc'd on beta-blocker (Coreg) only (no Amiodarone)  . Osteoarthritis   . Perforated bowel ([place])    perforation of the cecum  . Peritonitis ([place])   . Scrotal mass     Patient Active Problem List   Diagnosis Date Noted  . CKD (chronic kidney disease), stage IV ([place]) 11/16/2016  . COPD with chronic bronchitis ([place]) 11/16/2016  . Symptomatic anemia 11/16/2016  . Chronic systolic CHF (congestive heart failure) ([place]) 07/04/2016  . CKD (chronic kidney disease) stage 3, GFR 30-59 ml/min 07/04/2016  . Abdominal fluid collection   . Abdominal pain   . Epigastric pain   . AKI (acute kidney injury) ([place])   . NSVT (nonsustained ventricular tachycardia) ([place])   . Dilated cardiomyopathy ([place])   . Septic shock ([place])   . Pressure injury of skin 06/08/2016  . Acute respiratory failure with hypoxia ([place])   . Endotracheally intubated   . Scrotal mass   . Sepsis ([place])   . Acute hyperkalemia 06/07/2016    Past Surgical History:  Procedure Laterality Date  . COLON SURGERY    . COLOSTOMY         Home Medications    Prior to Admission medications   Medication Sig Start Date End Date Taking? Authorizing Provider  albuterol (PROVENTIL) (2.5 MG/3ML) 0.083%  nebulizer solution Take 2.5 mg by nebulization every 6 (six) hours as needed for wheezing or shortness of breath.   Yes Historical Provider, MD  allopurinol (ZYLOPRIM) 100 MG tablet Take 100 mg by mouth daily.   Yes Historical Provider, MD  amiodarone (PACERONE) 200 MG tablet Take 1 tablet (200 mg total) by mouth daily. 08/25/16  Yes Will Meredith Leeds, MD  aspirin EC 81 MG tablet Take 81 mg by mouth daily.   Yes Historical Provider, MD  carvedilol (COREG) 6.25 MG tablet Take 1.5 tablets (9.375 mg total) by mouth 2 (two) times daily. 08/09/16  Yes Liliane Shi, PA-C  diclofenac sodium (VOLTAREN) 1 % GEL Apply 2 g  topically 4 (four) times daily as needed (for pain).    Yes Historical Provider, MD  ferrous sulfate 325 (65 FE) MG tablet Take 325 mg by mouth 3 (three) times daily with meals.   Yes Historical Provider, MD  furosemide (LASIX) 40 MG tablet Take 1 tablet (40 mg total) by mouth daily as needed. Patient taking differently: Take 40 mg by mouth daily as needed for edema.  06/15/16  Yes Costin Karlyne Greenspan, MD  imiquimod (ALDARA) 5 % cream Apply 1 application topically at bedtime.   Yes Historical Provider, MD  Ipratropium-Albuterol (COMBIVENT RESPIMAT) 20-100 MCG/ACT AERS respimat Inhale 1 puff into the lungs every 6 (six) hours as needed for wheezing or shortness of breath.   Yes Historical Provider, MD  nystatin (MYCOSTATIN/NYSTOP) powder Apply 1 g topically 3 (three) times daily as needed (for itching/irritation).    Yes Historical Provider, MD  tiZANidine (ZANAFLEX) 4 MG tablet Take 4 mg by mouth 2 (two) times daily as needed for muscle spasms.    Yes Historical Provider, MD  traMADol (ULTRAM) 50 MG tablet Take 50 mg by mouth 2 (two) times daily.    Yes Historical Provider, MD    Family History Family History  Problem Relation Age of Onset  . Family history unknown: Yes    Social History Social History  Substance Use Topics  . Smoking status: Current Some Day Smoker    Packs/day: 0.25  . Smokeless tobacco: Never Used  . Alcohol use No     Allergies   Patient has no known allergies.   Review of Systems Review of Systems  Constitutional: Negative for fever.  HENT: Negative for congestion.   Eyes: Negative for visual disturbance.  Respiratory: Negative for shortness of breath.   Cardiovascular: Negative for chest pain.  Gastrointestinal: Negative for abdominal pain.  Genitourinary: Positive for scrotal swelling. Negative for dysuria.  Musculoskeletal: Negative for back pain.  Skin: Positive for wound.  Neurological: Positive for light-headedness.  Hematological: Bruises/bleeds  easily.  Psychiatric/Behavioral: Negative for confusion.     Physical Exam Updated Vital Signs BP (!) 98/52   Pulse 75   Temp 98.2 F (36.8 C) (Oral)   Resp (!) 23   Ht 5\' 11"  (1.803 m)   Wt 79.8 kg   SpO2 96%   BMI 24.55 kg/m   Physical Exam  Constitutional: He is oriented to person, place, and time. He appears well-developed and well-nourished. No distress.  HENT:  Head: Normocephalic.  Right Ear: External ear normal.  Left Ear: External ear normal.  Mouth/Throat: Oropharynx is clear and moist.  Eyes: EOM are normal. Pupils are equal, round, and reactive to light.  Neck: Normal range of motion. Neck supple.  Cardiovascular: Normal rate and regular rhythm.   Pulmonary/Chest: Effort normal and breath sounds normal. No respiratory  distress.  Abdominal: Bowel sounds are normal. There is no tenderness.  Right lower quadrant colostomy.  Genitourinary:  Genitourinary Comments: Bilateral scrotal swelling left greater than right. No significant tenderness. 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 Measuring about 10 cm x 5. Some oozing of blood no significant hemorrhage.  Musculoskeletal: Normal range of motion. He exhibits no edema.  Neurological: He is alert and oriented to person, place, and time. No cranial nerve deficit or sensory deficit. He exhibits normal muscle tone. Coordination normal.  Skin: Skin is warm.  Nursing note and vitals reviewed.    ED Treatments / Results  Labs (all labs ordered are listed, but only abnormal results are displayed) Labs Reviewed  COMPREHENSIVE METABOLIC PANEL - Abnormal; Notable for the following:       Result Value   Glucose, Bld 112 (*)    BUN 37 (*)    Creatinine, Ser 2.85 (*)    Calcium 8.2 (*)    Albumin 2.2 (*)    Alkaline Phosphatase 147 (*)    GFR calc non Af Amer 21 (*)    GFR calc Af Amer 24 (*)    All other components within normal limits  CBC - Abnormal; Notable for the following:    RBC 2.95  (*)    Hemoglobin 6.2 (*)    HCT 22.9 (*)    MCV 77.6 (*)    MCH 21.0 (*)    MCHC 27.1 (*)    RDW 22.9 (*)    All other components within normal limits  BRAIN NATRIURETIC PEPTIDE - Abnormal; Notable for the following:    B Natriuretic Peptide 491.1 (*)    All other components within normal limits  I-STAT CHEM 8, ED - Abnormal; Notable for the following:    BUN 34 (*)    Creatinine, Ser 2.90 (*)    Glucose, Bld 109 (*)    Hemoglobin 7.5 (*)    HCT 22.0 (*)    All other components within normal limits  PROTIME-INR  URINALYSIS, ROUTINE W REFLEX MICROSCOPIC  I-STAT CG4 LACTIC ACID, ED  I-STAT CG4 LACTIC ACID, ED  TYPE AND SCREEN  ABO/RH  PREPARE RBC (CROSSMATCH)    EKG  EKG Interpretation  Date/Time:  Wednesday November 16 2016 14:40:52 EDT Ventricular Rate:  71 PR Interval:    QRS Duration: 181 QT Interval:  484 QTC Calculation: 527 R Axis:   172 Text Interpretation:  Sinus rhythm Prolonged PR interval Nonspecific intraventricular conduction delay Confirmed by Barba Solt  MD, Shriya Aker [id number]) on 11/16/2016 2:46:36 PM       Radiology Dg Chest Port 1 View  Result Date: 11/16/2016 CLINICAL DATA:  Hypotension, history of scrotal lesion EXAM: PORTABLE CHEST 1 VIEW COMPARISON:  Portable chest x-ray of 06/13/2016 FINDINGS: No pneumonia or effusion is seen. Moderate cardiomegaly remains. There may be very minimal pulmonary vascular congestion present. A tiny left pleural effusion cannot be excluded. There are degenerative changes in both shoulders. IMPRESSION: Stable cardiomegaly. Questionable minimal pulmonary vascular congestion. Also, cannot exclude a tiny left pleural effusion. Electronically Signed   By: Ivar Drape M.D.   On: 11/16/2016 17:07    Procedures Procedures (including critical care time)  CRITICAL CARE Performed by: Fredia Sorrow Total critical care time: 30 minutes Critical care time was exclusive of separately billable procedures and treating other  patients. Critical care was necessary to treat or prevent imminent or life-threatening deterioration. Critical care was time spent personally by me on the following activities: development of  treatment plan with patient and/or surrogate as well as nursing, discussions with consultants, evaluation of patient's response to treatment, examination of patient, obtaining history from patient or surrogate, ordering and performing treatments and interventions, ordering and review of laboratory studies, ordering and review of radiographic studies, pulse oximetry and re-evaluation of patient's condition.   Medications Ordered in ED Medications  0.9 %  sodium chloride infusion ( Intravenous New Bag/Given 11/16/16 1548)  0.9 %  sodium chloride infusion (not administered)  sodium chloride 0.9 % bolus 250 mL (0 mLs Intravenous Stopped 11/16/16 1630)     Initial Impression / Assessment and Plan / ED Course  I have reviewed the triage vital signs and the nursing notes.  Pertinent labs & imaging results that were available during my care of the patient were reviewed by me and considered in my medical decision making (see chart for details).     Patient will be admitted by hospitalist team fours drop in hemoglobin. And for borderline hypotension. Patient's blood pressures often are in the low 90s. Today were getting upper 80s occasionally low 90s. Also patient for the past 3 days has felt lightheaded when he stands up. Transfusion ordered.  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 That has had difficulty with bleeding on and off. According to patient's been bleeding a little heavier for the past 2 weeks. Patient followed by urology for this.  Discuss with urology. They did not seem to be interested in consultation. Related this information on to the hospitalist. Is given a contact themselves. Raise question of whether he could at least be cauterized somewhat slow down  the bleeding some. Realizes would not take care of the problem long-term.  Patient is a poor surgical candidate due to his significant ischemic cardiomyopathy.    Final Clinical Impressions(s) / ED Diagnoses   Final diagnoses:  Hypotension, unspecified hypotension type  Condyloma  Ischemic cardiomyopathy  Anemia, unspecified type    New Prescriptions New Prescriptions   No medications on file     Fredia Sorrow, MD 11/16/16 1732

## 2016-11-16 NOTE — ED Notes (Signed)
Pt open wound cleaned and ABD pad placed to control bleeding.

## 2016-11-16 NOTE — ED Notes (Signed)
Bed: WA06 Expected date:  Expected time:  Means of arrival:  Comments: EMS-bleeding from scrotum

## 2016-11-16 NOTE — H&P (Signed)
History and Physical    [name] OMV:[id number] DOB: [birth year] DOA: 11/16/2016  I have briefly reviewed the patient's prior medical records in [place]  PCP: Philis Fendt, MD  Patient coming from: home  Chief Complaint: Weakness  HPI: [name] is a 71 y.o. 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  Patient tells me that he has been feeling fatigued and weak over the last several days, and he has noticed that his scrotal mass has been bleeding more and more recently, especially when he tries to get up and walk around.  He has no chest pain, denies any shortness of breath, and as far as his heart disease is concerned he has no complaints.  He denies any abdominal pain, no nausea or vomiting.  He denies any fever or chills.  He denies any palpitations.  ED Course: In the emergency room patient is afebrile, blood pressure is in the 90s, and he is satting well on room air.  Blood work reveals a creatinine of 2.8, BNP slightly elevated 491, and her initial hemoglobin of 6.2 on a regular CBC, and 7.5 and rapid i-STAT.  He is scrotal mass appears to be bleeding.  Urology was consulted by EDP and TRH was asked for admission.  Review of Systems: As per HPI otherwise 10 point review of systems negative.   Past Medical History:  Diagnosis Date  . Arthritis   . Chronic kidney disease, stage 3   . Chronic systolic CHF (congestive heart failure) ([place]) 07/04/2016   a. Echo 10/17: Moderate concentric LVH, EF 20-25, diffuse HK, anteroseptal, inferior and inferoseptal akinesis, grade 2 diastolic dysfunction, severe LAE  //  b. Echo 08/16/16: EF 20-25, severe diff HK, inf-lat, inf and  inf-septal AK and scarring c/w RCA distribution infarct, Gr 2 DD, mild MR, mod to severe LAE, mild RVE, mild RAE, PASP 31 (Nuc study neg for scar)  . Colon obstruction    Sigmoid  . COPD (chronic obstructive pulmonary disease) ([place])   . Dilated cardiomyopathy ([place])    A. LHC 7/06: normal coronary arteries  //  b. Myoview 12/17: severely dilated LV, frequent multifocal PVCs, EF < 30, no infarct or ischemia (c/w dilated NICM), high risk  . Diverticulitis   . Erectile dysfunction   . Fistula    chronic enterocutaneous  . Gout   . Hypertension   . Inguinal hernia   . NSVT (nonsustained ventricular tachycardia) (HCC)    A. admx in 62/83 with metabolic derangement in setting of septic shock - NSVT tx with Amio/Lidocaine; EF 20-25 on echo; Dc'd on beta-blocker (Coreg) only (no Amiodarone)  . Osteoarthritis   . Perforated bowel ([place])    perforation of the cecum  . Peritonitis ([place])   . Scrotal mass     Past Surgical History:  Procedure Laterality Date  . COLON SURGERY    . COLOSTOMY       reports that he has been smoking.  He has been smoking about 0.25 packs per day. He has never used smokeless tobacco. He reports that he does not drink alcohol. His drug history is not on file.  No Known Allergies  Family History  Problem Relation Age of Onset  . Family history  unknown: Yes   Unknown family history  Prior to Admission medications   Medication Sig Start Date End Date Taking? Authorizing Provider  albuterol (PROVENTIL) (2.5 MG/3ML) 0.083% nebulizer solution Take 2.5 mg by nebulization every 6 (six) hours as needed for wheezing or shortness of breath.   Yes Historical Provider, MD  allopurinol (ZYLOPRIM) 100 MG tablet Take 100 mg by mouth daily.   Yes Historical Provider, MD  amiodarone (PACERONE) 200 MG tablet Take 1 tablet (200 mg total) by mouth daily. 08/25/16  Yes Will Meredith Leeds, MD  aspirin EC 81 MG tablet Take 81 mg by mouth daily.   Yes Historical Provider, MD    carvedilol (COREG) 6.25 MG tablet Take 1.5 tablets (9.375 mg total) by mouth 2 (two) times daily. 08/09/16  Yes Liliane Shi, PA-C  diclofenac sodium (VOLTAREN) 1 % GEL Apply 2 g topically 4 (four) times daily as needed (for pain).    Yes Historical Provider, MD  ferrous sulfate 325 (65 FE) MG tablet Take 325 mg by mouth 3 (three) times daily with meals.   Yes Historical Provider, MD  furosemide (LASIX) 40 MG tablet Take 1 tablet (40 mg total) by mouth daily as needed. Patient taking differently: Take 40 mg by mouth daily as needed for edema.  06/15/16  Yes Ginelle Bays Karlyne Greenspan, MD  imiquimod (ALDARA) 5 % cream Apply 1 application topically at bedtime.   Yes Historical Provider, MD  Ipratropium-Albuterol (COMBIVENT RESPIMAT) 20-100 MCG/ACT AERS respimat Inhale 1 puff into the lungs every 6 (six) hours as needed for wheezing or shortness of breath.   Yes Historical Provider, MD  nystatin (MYCOSTATIN/NYSTOP) powder Apply 1 g topically 3 (three) times daily as needed (for itching/irritation).    Yes Historical Provider, MD  tiZANidine (ZANAFLEX) 4 MG tablet Take 4 mg by mouth 2 (two) times daily as needed for muscle spasms.    Yes Historical Provider, MD  traMADol (ULTRAM) 50 MG tablet Take 50 mg by mouth 2 (two) times daily.    Yes Historical Provider, MD    Physical Exam: Vitals:   11/16/16 1410 11/16/16 1421 11/16/16 1502 11/16/16 1530  BP: 99/62  (!) 94/50 (!) 93/53  Pulse: 70  72 73  Resp: 20  18 19   Temp: 98.2 F (36.8 C)     TempSrc: Oral     SpO2: 96% 95% 93% 93%  Weight: 79.8 kg (176 lb)     Height: 5\' 11"  (1.803 m)      Constitutional: NAD, calm, comfortable Vitals:   11/16/16 1410 11/16/16 1421 11/16/16 1502 11/16/16 1530  BP: 99/62  (!) 94/50 (!) 93/53  Pulse: 70  72 73  Resp: 20  18 19   Temp: 98.2 F (36.8 C)     TempSrc: Oral     SpO2: 96% 95% 93% 93%  Weight: 79.8 kg (176 lb)     Height: 5\' 11"  (1.803 m)      Eyes: PERRL, lids and conjunctivae normal ENMT: Mucous  membranes are moist. Posterior pharynx clear of any exudate or lesions. Neck: normal, supple, no masses Respiratory: clear to auscultation bilaterally, no wheezing, no crackles. Normal respiratory effort. No accessory muscle use.  Cardiovascular: Regular rate and rhythm, no murmurs / rubs / gallops. No extremity edema. 2+ pedal pulses.  Abdomen: no tenderness, no masses palpated.  Musculoskeletal: no clubbing / cyanosis. Decreased muscle tone.  Skin: no rashes, lesions, ulcers. No induration Neurologic: CN 2-12 grossly intact. Strength 5/5 in all 4.  Psychiatric: Normal judgment  and insight. Alert and oriented x 3. Normal mood.   Labs on Admission: I have personally reviewed following labs and imaging studies  CBC:  Recent Labs Lab 11/16/16 1454 11/16/16 1514  WBC 5.8  --   HGB 6.2* 7.5*  HCT 22.9* 22.0*  MCV 77.6*  --   PLT 258  --    Basic Metabolic Panel:  Recent Labs Lab 11/16/16 1454 11/16/16 1514  NA 135 137  K 4.0 4.0  CL 105 104  CO2 22  --   GLUCOSE 112* 109*  BUN 37* 34*  CREATININE 2.85* 2.90*  CALCIUM 8.2*  --    GFR: Estimated Creatinine Clearance: 24.5 mL/min (A) (by C-G formula based on SCr of 2.9 mg/dL (H)). Liver Function Tests:  Recent Labs Lab 11/16/16 1454  AST 24  ALT 19  ALKPHOS 147*  BILITOT 0.9  PROT 7.0  ALBUMIN 2.2*   No results for input(s): LIPASE, AMYLASE in the last 168 hours. No results for input(s): AMMONIA in the last 168 hours. Coagulation Profile:  Recent Labs Lab 11/16/16 1454  INR 1.15   Cardiac Enzymes: No results for input(s): CKTOTAL, CKMB, CKMBINDEX, TROPONINI in the last 168 hours. BNP (last 3 results) No results for input(s): PROBNP in the last 8760 hours. HbA1C: No results for input(s): HGBA1C in the last 72 hours. CBG: No results for input(s): GLUCAP in the last 168 hours. Lipid Profile: No results for input(s): CHOL, HDL, LDLCALC, TRIG, CHOLHDL, LDLDIRECT in the last 72 hours. Thyroid Function  Tests: No results for input(s): TSH, T4TOTAL, FREET4, T3FREE, THYROIDAB in the last 72 hours. Anemia Panel: No results for input(s): VITAMINB12, FOLATE, FERRITIN, TIBC, IRON, RETICCTPCT in the last 72 hours. Urine analysis:    Component Value Date/Time   COLORURINE YELLOW 11/16/2016 1436   APPEARANCEUR CLEAR 11/16/2016 1436   LABSPEC 1.010 11/16/2016 1436   PHURINE 5.0 11/16/2016 1436   GLUCOSEU NEGATIVE 11/16/2016 1436   HGBUR NEGATIVE 11/16/2016 1436   BILIRUBINUR NEGATIVE 11/16/2016 1436   KETONESUR NEGATIVE 11/16/2016 1436   PROTEINUR NEGATIVE 11/16/2016 1436   NITRITE NEGATIVE 11/16/2016 1436   LEUKOCYTESUR NEGATIVE 11/16/2016 1436     Radiological Exams on Admission: Dg Chest Port 1 View  Result Date: 11/16/2016 CLINICAL DATA:  Hypotension, history of scrotal lesion EXAM: PORTABLE CHEST 1 VIEW COMPARISON:  Portable chest x-ray of 06/13/2016 FINDINGS: No pneumonia or effusion is seen. Moderate cardiomegaly remains. There may be very minimal pulmonary vascular congestion present. A tiny left pleural effusion cannot be excluded. There are degenerative changes in both shoulders. IMPRESSION: Stable cardiomegaly. Questionable minimal pulmonary vascular congestion. Also, cannot exclude a tiny left pleural effusion. Electronically Signed   By: Ivar Drape M.D.   On: 11/16/2016 17:07    EKG: Independently reviewed. Sinus rhythm, borderline grade 1 AV block  Assessment/Plan Active Problems:   Scrotal mass   AKI (acute kidney injury) ([place])   Dilated cardiomyopathy (HCC)   Chronic systolic CHF (congestive heart failure) (HCC)   CKD (chronic kidney disease), stage IV (HCC)   COPD with chronic bronchitis (HCC)   Symptomatic anemia   Scrotal mass / condylomata -Urology consulted, appreciate input.  Seems like has been evaluated as an outpatient, with plans for surgical repair.  Will admit patient to the hospital given symptomatic anemia, his hemoglobin on a regular CBC was down to 6.2,  he likely has a degree of acute on chronic bleeding from this mass  Symptomatic anemia -Hemoglobin on CBC was down to 6.2 from mid  7-8 as an outpatient,will transfuse 1 unit packed red blood cells.  Closely monitor respiratory status given his heart failure.  Chronic systolic CHF -Patient is being followed by Dr. Martinique from cardiology, most recent 2D echo was done in January 2018 and showed an EF of 20-25%.  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  Hypotension -This is chronic for patient, I doubt this is what is giving him his symptoms, on our most recent outpatient visits with cardiology his blood pressure was in the 76K-08U systolic -We will decrease Coreg from 6.25-3.125  History of NSVT -Continue amiodarone -He has been followed by EP as an outpatient  Bilateral renal masses -Again addressed by urology, these were probably to be followed up as an outpatient  COPD -No wheezing, this seems stable, continue home medications  AKI on chronic kidney disease stage IV -Excellent creatinine between 1.7-2.0, his creatinine is close to 3 now, he appears clinically dry, will hold his Lasix, repeat renal function in the morning after 1 unit of packed red blood cells. Hold additional fluids.    DVT prophylaxis: SCDs Code Status: Full code Family Communication: family bedside Disposition Plan: Admit to telemetry, home when ready Consults called: Urology    Admission status: Inpatient   At the time of admission, it appears that the appropriate admission status for this patient is INPATIENT. This is judged to be reasonable and necessary in order to provide the required high service intensity to ensure the patient's safety given the presenting symptoms, physical exam findings, and initial radiographic and laboratory data in the context of their chronic comorbidities. Current circumstances are anemia, active bleeding, worsening renal failure, and it is felt to place patient at  high risk for further clinical deterioration threatening life, limb, or organ. Moreover, it is my clinical judgment that the patient will require inpatient hospital care spanning beyond 2 midnights from the point of admission and that early discharge would result in unnecessary risk of decompensation and readmission or threat to life, limb or bodily function.   Marzetta Board, MD [facility] Pager [phone]  If 7PM-7AM, please contact night-coverage [email or web address] Password [email or web address]  11/16/2016, 4:59 PM

## 2016-11-17 LAB — COMPREHENSIVE METABOLIC PANEL
ALBUMIN: 2.2 g/dL — AB (ref 3.5–5.0)
ALK PHOS: 134 U/L — AB (ref 38–126)
ALT: 17 U/L (ref 17–63)
AST: 22 U/L (ref 15–41)
Anion gap: 9 (ref 5–15)
BILIRUBIN TOTAL: 1 mg/dL (ref 0.3–1.2)
BUN: 38 mg/dL — AB (ref 6–20)
CALCIUM: 8.3 mg/dL — AB (ref 8.9–10.3)
CO2: 22 mmol/L (ref 22–32)
Chloride: 106 mmol/L (ref 101–111)
Creatinine, Ser: 2.39 mg/dL — ABNORMAL HIGH (ref 0.61–1.24)
GFR calc Af Amer: 30 mL/min — ABNORMAL LOW (ref >60)
GFR calc non Af Amer: 25 mL/min — ABNORMAL LOW (ref >60)
GLUCOSE: 104 mg/dL — AB (ref 65–99)
Potassium: 4 mmol/L (ref 3.5–5.1)
Sodium: 137 mmol/L (ref 135–145)
TOTAL PROTEIN: 7.1 g/dL (ref 6.5–8.1)

## 2016-11-17 LAB — CBC
HEMATOCRIT: 25.6 % — AB (ref 39.0–52.0)
Hemoglobin: 7.3 g/dL — ABNORMAL LOW (ref 13.0–17.0)
MCH: 22.4 pg — ABNORMAL LOW (ref 26.0–34.0)
MCHC: 28.5 g/dL — AB (ref 30.0–36.0)
MCV: 78.5 fL (ref 78.0–100.0)
PLATELETS: 264 10*3/uL (ref 150–400)
RBC: 3.26 MIL/uL — ABNORMAL LOW (ref 4.22–5.81)
RDW: 22.4 % — AB (ref 11.5–15.5)
WBC: 6.7 10*3/uL (ref 4.0–10.5)

## 2016-11-17 LAB — PREPARE RBC (CROSSMATCH)

## 2016-11-17 MED ORDER — FUROSEMIDE 40 MG PO TABS
40.0000 mg | ORAL_TABLET | Freq: Every day | ORAL | Status: DC
  Administered 2016-11-17 – 2016-11-20 (×4): 40 mg via ORAL
  Filled 2016-11-17 (×5): qty 1

## 2016-11-17 MED ORDER — CARVEDILOL 6.25 MG PO TABS
6.2500 mg | ORAL_TABLET | Freq: Two times a day (BID) | ORAL | Status: DC
  Administered 2016-11-17 – 2016-11-19 (×5): 6.25 mg via ORAL
  Filled 2016-11-17 (×6): qty 1

## 2016-11-17 MED ORDER — SODIUM CHLORIDE 0.9 % IV SOLN
Freq: Once | INTRAVENOUS | Status: AC
  Administered 2016-11-17: 13:00:00 via INTRAVENOUS

## 2016-11-17 MED ORDER — SILVER NITRATE-POT NITRATE 75-25 % EX MISC
1.0000 | Freq: Once | CUTANEOUS | Status: DC | PRN
  Filled 2016-11-17 (×8): qty 1

## 2016-11-17 NOTE — Consult Note (Signed)
Urology Consult  CC: Referring physician: Caren Griffins, MD Reason for referral: Large, bleeding scrotal condyloma  Impression/Assessment:  Large scrotal condyloma - this  has failed conservative management as Dr. Alinda Money.  He would due to its very large size and has now begun to bleed to the point where he has required a transfusion.  I see he has seen cardiology and the plan was to surgically remove these lesions however there is a concern about his very significant ischemic cardiomyopathy.  His suitability for surgery was not addressed in the notes from the cardiologist that I reviewed.  Right renal lesion - he does have a 6 cm complex cystic lesion in the right kidney with mural nodularity.  This may require further characterization however it sounds like nonsurgical management may be necessary such as cryotherapy.  Bilateral hydroceles - he had a large hydrocele that appeared simple on ultrasound or on the left-hand side.  The right side appeared with more complex. The presence of these hydroceles may be somewhat helpful as far as reconstruction goes as the reduction of the hydroceles will result in some scrotal skin redundancy which could potentially be used for closure purposes once his condyloma has been excised.  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  It was noted on scrotal ultrasound.  It is of no clinical significance at this time.  Procedure: I noted an area of bright red bleeding from the superior aspect of the condyloma.  I placed some Surgicel down in the interstices, injected FloSeal over this and around the area and then covered it with Gelfoam.  This appeared to of control the bleeding and so dry 4 x 4 gauze was applied and an ABD mesh briefs to apply pressure.   Plan: 1.  Topical hemostatic agents. 2.  If bleeding continues we will try silver nitrate. 3.  Once bleeding controlled arrangements have been made for him to be evaluated and treated at  [facility] due to his significant cardiac disease and increased risk for surgery.    History of Present Illness: [name] is a 71 year old male who was seen in hospital consultation for further evaluation of a large, bleeding scrotal condyloma.  He was initially evaluated for this in 10/17 and has been following Dr. Alinda Money as an outpatient.  A trial of Aldara was undertaken which did not result in any significant decrease in size of the right sided condylomatous lesion but there was some slight improvement of the left sided lesions.  He said that he had to apply a pad because of some slight oozing and has been doing this for quite some time and would have to change the pad once or twice a day.  There was very minimal oozing but recently he said he has had more active bleeding.  He has a history of anemia that was not associated with blood loss as well and was found to have a hemoglobin of 6.2 yesterday requiring transfusion.  He has a history of ischemic cardiomyopathy and has been undergoing outpatient evaluation and optimization.  He also has a history of bilateral complex cysts that were evaluated with an MRI scan which revealed the 6 cm cyst on the right appeared to have mural nodularity although the study was performed without contrast despite being ordered with and without contrast and therefore for characterization of these lesions still remains to be performed.  He also has known bilateral hydroceles the left being larger than the right.  Past Medical History:  Diagnosis Date  . Arthritis   . Chronic kidney disease, stage 3   . Chronic systolic CHF (congestive heart failure) ([place]) 07/04/2016   a. Echo 10/17: Moderate concentric LVH, EF 20-25, diffuse HK, anteroseptal, inferior and inferoseptal akinesis, grade 2 diastolic dysfunction, severe LAE  //  b. Echo 08/16/16: EF 20-25, severe diff HK, inf-lat, inf and inf-septal AK and scarring c/w RCA distribution infarct, Gr 2 DD, mild MR, mod to  severe LAE, mild RVE, mild RAE, PASP 31 (Nuc study neg for scar)  . Colon obstruction    Sigmoid  . COPD (chronic obstructive pulmonary disease) ([place])   . Dilated cardiomyopathy ([place])    A. LHC 7/06: normal coronary arteries  //  b. Myoview 12/17: severely dilated LV, frequent multifocal PVCs, EF < 30, no infarct or ischemia (c/w dilated NICM), high risk  . Diverticulitis   . Erectile dysfunction   . Fistula    chronic enterocutaneous  . Gout   . Hypertension   . Inguinal hernia   . NSVT (nonsustained ventricular tachycardia) (HCC)    A. admx in 38/93 with metabolic derangement in setting of septic shock - NSVT tx with Amio/Lidocaine; EF 20-25 on echo; Dc'd on beta-blocker (Coreg) only (no Amiodarone)  . Osteoarthritis   . Perforated bowel ([place])    perforation of the cecum  . Peritonitis ([place])   . Scrotal mass    Past Surgical History:  Procedure Laterality Date  . COLON SURGERY    . COLOSTOMY      Medications:  Scheduled: . amiodarone  200 mg Oral Daily  . carvedilol  6.25 mg Oral BID  . ferrous sulfate  325 mg Oral TID WC  . furosemide  40 mg Oral Daily  . imiquimod  1 application Topical QHS  . sodium chloride flush  3 mL Intravenous Q12H  . traMADol  50 mg Oral BID   Continuous:   Allergies: No Known Allergies  Family History  Problem Relation Age of Onset  . Family history unknown: Yes    Social History:  reports that he has been smoking.  He has been smoking about 0.25 packs per day. He has never used smokeless tobacco. He reports that he does not drink alcohol. His drug history is not on file.  Review of Systems (10 point): Pertinent items are noted in HPI. A comprehensive review of systems was negative except as noted above.  Physical Exam:  Vital signs in last 24 hours: Temp:  [97.5 F (36.4 C)-99.2 F (37.3 C)] 99.2 F (37.3 C) (04/05 0553) Pulse Rate:  [70-98] 98 (04/05 0553) Resp:  [14-29] 18 (04/05 0553) BP: (87-110)/(45-62) 110/49 (04/05  0553) SpO2:  [88 %-100 %] 97 % (04/05 0553) Weight:  [175 lb 4.3 oz (79.5 kg)-176 lb (79.8 kg)] 175 lb 4.3 oz (79.5 kg) (04/04 1748) General appearance: alert and appears stated age Head: Normocephalic, without obvious abnormality, atraumatic Eyes: conjunctivae/corneas clear. EOM's intact.  Oropharynx: moist mucous membranes Neck: supple, symmetrical, trachea midline Resp: normal respiratory effort Cardio: regular rate and rhythm Back: symmetric, no curvature. ROM normal. No CVA tenderness. GI: soft, non-tender; bowel sounds normal; no masses,  no organomegaly  Male genitalia: penis: normal male phallus with no lesions or discharge. His scrotum is enlarged consistent with bilateral hydroceles. In addition, he has a large 5 x 5 cm right-sided condyloma extending on the right hemiscrotum toward the groin. At the superior aspect of this condylomatous lesion he had bright red bleeding  noted. He also has evidence of a developing left-sided condyloma that is smaller measuring 2 x 3 cm. This is located over the left upper scrotum. No penile warts are noted. No other scrotal abnormalities are noted.  Extremities: extremities normal, atraumatic, no cyanosis or edema Skin: Skin color normal. No visible rashes or lesions Neurologic: Grossly normal  Laboratory Data:   Recent Labs  11/16/16 1454 11/16/16 1514 11/17/16 0500  WBC 5.8  --  6.7  HGB 6.2* 7.5* 7.3*  HCT 22.9* 22.0* 25.6*   BMET  Recent Labs  11/16/16 1454 11/16/16 1514 11/17/16 0500  NA 135 137 137  K 4.0 4.0 4.0  CL 105 104 106  CO2 22  --  22  GLUCOSE 112* 109* 104*  BUN 37* 34* 38*  CREATININE 2.85* 2.90* 2.39*  CALCIUM 8.2*  --  8.3*    Recent Labs  11/16/16 1454  INR 1.15   No results for input(s): LABURIN in the last 72 hours. Results for orders placed or performed during the hospital encounter of 06/07/16  Blood culture (routine x 2)     Status: None   Collection Time: 06/07/16  1:20 PM  Result Value Ref  Range Status   Specimen Description BLOOD RIGHT ANTECUBITAL  Final   Special Requests BOTTLES DRAWN AEROBIC AND ANAEROBIC  5CC  Final   Culture NO GROWTH 5 DAYS  Final   Report Status 06/12/2016 FINAL  Final  Blood culture (routine x 2)     Status: None   Collection Time: 06/07/16  3:24 PM  Result Value Ref Range Status   Specimen Description BLOOD RIGHT HAND  Final   Special Requests IN PEDIATRIC BOTTLE 2CC  Final   Culture NO GROWTH 5 DAYS  Final   Report Status 06/12/2016 FINAL  Final  MRSA PCR Screening     Status: None   Collection Time: 06/07/16  8:33 PM  Result Value Ref Range Status   MRSA by PCR NEGATIVE NEGATIVE Final    Comment:        The GeneXpert MRSA Assay (FDA approved for NASAL specimens only), is one component of a comprehensive MRSA colonization surveillance program. It is not intended to diagnose MRSA infection nor to guide or monitor treatment for MRSA infections.   Urine culture     Status: None   Collection Time: 06/07/16  8:50 PM  Result Value Ref Range Status   Specimen Description URINE, CATHETERIZED  Final   Special Requests NONE  Final   Culture NO GROWTH  Final   Report Status 06/09/2016 FINAL  Final   Creatinine:  Recent Labs  11/16/16 1454 11/16/16 1514 11/17/16 0500  CREATININE 2.85* 2.90* 2.39*    Imaging: Dg Chest Port 1 View  Result Date: 11/16/2016 CLINICAL DATA:  Hypotension, history of scrotal lesion EXAM: PORTABLE CHEST 1 VIEW COMPARISON:  Portable chest x-ray of 06/13/2016 FINDINGS: No pneumonia or effusion is seen. Moderate cardiomegaly remains. There may be very minimal pulmonary vascular congestion present. A tiny left pleural effusion cannot be excluded. There are degenerative changes in both shoulders. IMPRESSION: Stable cardiomegaly. Questionable minimal pulmonary vascular congestion. Also, cannot exclude a tiny left pleural effusion. Electronically Signed   By: Ivar Drape M.D.   On: 11/16/2016 17:07    MRI and scrotal  ultrasound images were independently reviewed.   [name] C 11/17/2016, 7:42 AM

## 2016-11-17 NOTE — Progress Notes (Addendum)
Pt noted with small amount of frank red bleeding on the exterior dressing at scaotal site saturating through to the linens. Dressing re-enforced and MD notified. SRP, RN

## 2016-11-17 NOTE — Progress Notes (Signed)
PROGRESS NOTE  [name] AOZ:[id number] DOB: March 16, 1946 DOA: 11/16/2016 PCP: Philis Fendt, MD   LOS: 1 day   Brief Narrative: [name] is a 71 y.o. 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  Assessment & Plan: Active Problems:   Scrotal mass   AKI (acute kidney injury) ([name])   Dilated cardiomyopathy (HCC)   Chronic systolic CHF (congestive heart failure) (HCC)   CKD (chronic kidney disease), stage IV (HCC)   COPD with chronic bronchitis (HCC)   Symptomatic anemia   Scrotal mass / condylomata -Urology consulted, appreciate input, to be evaluated today  Symptomatic anemia -Hemoglobin on CBC was down to 6.2 from mid 7-8 as an outpatient -Transfuse 1 unit of packed red blood cells, hemoglobin today is increased appropriately to 7.3 -We will transfuse an additional unit of blood given extensive cardiac history, give Lasix 1 with that  Chronic systolic CHF -Patient is being followed by Dr. Martinique from cardiology, most recent 2D echo was done in January 2018 and showed an EF of 20-25%.  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 -He appears euvolemic this morning, because will get transfused will give Lasix 1  Hypotension -This is chronic for patient, I doubt this is what is giving him his symptoms, on our most recent outpatient visits with cardiology his blood pressure was in the 96E-95M systolic -On admission his Coreg was decreased, however his blood pressure this morning has improved after blood transfusion, will resume Coreg to his previous home dose today  History of NSVT -Continue amiodarone -He has been followed by EP as an  outpatient -Continues to have short bursts of NS telemetry VT on  Bilateral renal masses -Again addressed by urology, these were probably to be followed up as an outpatient  COPD -No wheezing, this seems stable, continue home medications  AKI on chronic kidney disease stage IV -baseline creatinine between 1.7-2.0, his creatinine has improved today after blood transfusion, suspect slight worsening due to anemia versus home Lasix use   DVT prophylaxis: SCD Code Status: Full code Family Communication: no family at bedside Disposition Plan: home when ready  Consultants:   Urology   Procedures:   None   Antimicrobials:  None    Subjective: - no chest pain, shortness of breath, no abdominal pain, nausea or vomiting.   Objective: Vitals:   11/16/16 1850 11/16/16 2100 11/16/16 2134 11/17/16 0553  BP: (!) 95/45 (!) 93/51 (!) 98/57 (!) 110/49  Pulse: 74 77 73 98  Resp: 14 16 16 18   Temp: 97.7 F (36.5 C) 97.8 F (36.6 C) 97.5 F (36.4 C) 99.2 F (37.3 C)  TempSrc: Oral Oral Oral Oral  SpO2:  96% 100% 97%  Weight:      Height:        Intake/Output Summary (Last 24 hours) at 11/17/16 1147 Last data filed at 11/17/16 0600  Gross per 24 hour  Intake              848 ml  Output              975 ml  Net             -127 ml   Filed Weights   11/16/16 1410 11/16/16  1748  Weight: 79.8 kg (176 lb) 79.5 kg (175 lb 4.3 oz)    Examination: Constitutional: NAD Vitals:   11/16/16 1850 11/16/16 2100 11/16/16 2134 11/17/16 0553  BP: (!) 95/45 (!) 93/51 (!) 98/57 (!) 110/49  Pulse: 74 77 73 98  Resp: 14 16 16 18   Temp: 97.7 F (36.5 C) 97.8 F (36.6 C) 97.5 F (36.4 C) 99.2 F (37.3 C)  TempSrc: Oral Oral Oral Oral  SpO2:  96% 100% 97%  Weight:      Height:       Eyes: PERRL, lids and conjunctivae normal ENMT: Mucous membranes are moist. Neck: normal, supple, no masses Respiratory: clear to auscultation bilaterally, no wheezing, no crackles. Normal respiratory  effort. No accessory muscle use.  Cardiovascular: Regular rate and rhythm, no murmurs / rubs / gallops. No LE edema. 2+ pedal pulses.  Abdomen: no tenderness. Bowel sounds positive.  Musculoskeletal: no clubbing / cyanosis. No joint deformity upper and lower extremities.  Skin: no rashes, lesions, ulcers. Neurologic: CN 2-12 grossly intact. Strength 5/5 in all 4.     Data Reviewed: I have personally reviewed following labs and imaging studies  CBC:  Recent Labs Lab 11/16/16 1454 11/16/16 1514 11/17/16 0500  WBC 5.8  --  6.7  HGB 6.2* 7.5* 7.3*  HCT 22.9* 22.0* 25.6*  MCV 77.6*  --  78.5  PLT 258  --  824   Basic Metabolic Panel:  Recent Labs Lab 11/16/16 1454 11/16/16 1514 11/17/16 0500  NA 135 137 137  K 4.0 4.0 4.0  CL 105 104 106  CO2 22  --  22  GLUCOSE 112* 109* 104*  BUN 37* 34* 38*  CREATININE 2.85* 2.90* 2.39*  CALCIUM 8.2*  --  8.3*   GFR: Estimated Creatinine Clearance: 29.8 mL/min (A) (by C-G formula based on SCr of 2.39 mg/dL (H)). Liver Function Tests:  Recent Labs Lab 11/16/16 1454 11/17/16 0500  AST 24 22  ALT 19 17  ALKPHOS 147* 134*  BILITOT 0.9 1.0  PROT 7.0 7.1  ALBUMIN 2.2* 2.2*   No results for input(s): LIPASE, AMYLASE in the last 168 hours. No results for input(s): AMMONIA in the last 168 hours. Coagulation Profile:  Recent Labs Lab 11/16/16 1454  INR 1.15   Cardiac Enzymes: No results for input(s): CKTOTAL, CKMB, CKMBINDEX, TROPONINI in the last 168 hours. BNP (last 3 results) No results for input(s): PROBNP in the last 8760 hours. HbA1C: No results for input(s): HGBA1C in the last 72 hours. CBG: No results for input(s): GLUCAP in the last 168 hours. Lipid Profile: No results for input(s): CHOL, HDL, LDLCALC, TRIG, CHOLHDL, LDLDIRECT in the last 72 hours. Thyroid Function Tests: No results for input(s): TSH, T4TOTAL, FREET4, T3FREE, THYROIDAB in the last 72 hours. Anemia Panel: No results for input(s): VITAMINB12,  FOLATE, FERRITIN, TIBC, IRON, RETICCTPCT in the last 72 hours. Urine analysis:    Component Value Date/Time   COLORURINE YELLOW 11/16/2016 1436   APPEARANCEUR CLEAR 11/16/2016 1436   LABSPEC 1.010 11/16/2016 1436   PHURINE 5.0 11/16/2016 1436   GLUCOSEU NEGATIVE 11/16/2016 1436   HGBUR NEGATIVE 11/16/2016 1436   BILIRUBINUR NEGATIVE 11/16/2016 1436   KETONESUR NEGATIVE 11/16/2016 1436   PROTEINUR NEGATIVE 11/16/2016 1436   NITRITE NEGATIVE 11/16/2016 1436   LEUKOCYTESUR NEGATIVE 11/16/2016 1436   Sepsis Labs: Invalid input(s): PROCALCITONIN, LACTICIDVEN  No results found for this or any previous visit (from the past 240 hour(s)).    Radiology Studies: Dg Chest [facility]  Result Date: 11/16/2016 CLINICAL DATA:  Hypotension, history of scrotal lesion EXAM: PORTABLE CHEST 1 VIEW COMPARISON:  Portable chest x-ray of 06/13/2016 FINDINGS: No pneumonia or effusion is seen. Moderate cardiomegaly remains. There may be very minimal pulmonary vascular congestion present. A tiny left pleural effusion cannot be excluded. There are degenerative changes in both shoulders. IMPRESSION: Stable cardiomegaly. Questionable minimal pulmonary vascular congestion. Also, cannot exclude a tiny left pleural effusion. Electronically Signed   By: Ivar Drape M.D.   On: 11/16/2016 17:07     Scheduled Meds: . amiodarone  200 mg Oral Daily  . carvedilol  3.125 mg Oral BID  . ferrous sulfate  325 mg Oral TID WC  . imiquimod  1 application Topical QHS  . sodium chloride flush  3 mL Intravenous Q12H  . traMADol  50 mg Oral BID   Continuous Infusions:   Marzetta Board, MD, PhD [facility] Pager [phone]  If 7PM-7AM, please contact night-coverage [email or web address] Password [facility] 11/17/2016, 11:47 AM

## 2016-11-18 LAB — BPAM RBC
BLOOD PRODUCT EXPIRATION DATE: 201804242359
BLOOD PRODUCT EXPIRATION DATE: 201804242359
ISSUE DATE / TIME: 201804041824
ISSUE DATE / TIME: 201804051321
UNIT TYPE AND RH: 5100
Unit Type and Rh: 5100

## 2016-11-18 LAB — TYPE AND SCREEN
ABO/RH(D): O POS
ANTIBODY SCREEN: NEGATIVE
UNIT DIVISION: 0
Unit division: 0

## 2016-11-18 LAB — CBC
HCT: 27.3 % — ABNORMAL LOW (ref 39.0–52.0)
HEMOGLOBIN: 7.8 g/dL — AB (ref 13.0–17.0)
MCH: 22.3 pg — AB (ref 26.0–34.0)
MCHC: 28.6 g/dL — ABNORMAL LOW (ref 30.0–36.0)
MCV: 78 fL (ref 78.0–100.0)
PLATELETS: 278 10*3/uL (ref 150–400)
RBC: 3.5 MIL/uL — AB (ref 4.22–5.81)
RDW: 21.4 % — ABNORMAL HIGH (ref 11.5–15.5)
WBC: 8.7 10*3/uL (ref 4.0–10.5)

## 2016-11-18 LAB — BASIC METABOLIC PANEL
ANION GAP: 7 (ref 5–15)
BUN: 39 mg/dL — ABNORMAL HIGH (ref 6–20)
CALCIUM: 8.3 mg/dL — AB (ref 8.9–10.3)
CO2: 21 mmol/L — AB (ref 22–32)
CREATININE: 2.56 mg/dL — AB (ref 0.61–1.24)
Chloride: 109 mmol/L (ref 101–111)
GFR calc non Af Amer: 23 mL/min — ABNORMAL LOW (ref >60)
GFR, EST AFRICAN AMERICAN: 27 mL/min — AB (ref >60)
Glucose, Bld: 103 mg/dL — ABNORMAL HIGH (ref 65–99)
Potassium: 4.4 mmol/L (ref 3.5–5.1)
SODIUM: 137 mmol/L (ref 135–145)

## 2016-11-18 NOTE — Progress Notes (Signed)
I went by to check for bleeding and noted his bleeding had completely ceased. I had brought with me some FloSeal and therefore injected this into the interstices of the lesion where he had been bleeding from previously. I think with this in place and the intermittent use of the silver nitrate sticks his bleeding can likely be controlled.

## 2016-11-18 NOTE — Progress Notes (Signed)
Nursing has redressed scrotal area several times throughout the shift. Pt constantly pull the dressing off and reapply kleenex tissue at the site. Nursing staff has asked pt to call for assistance before touching scrotal wound. Pt states "he is going to take care of it himself." Will cont to work with pt and  Re-enforce dressing as needed. Bleeding is decreased when pt is not manipulating the wounds. Will cont to monitor. SRP RN.

## 2016-11-18 NOTE — Progress Notes (Signed)
PROGRESS NOTE  CHEIKH BRAMBLE VOZ:[id number] DOB: [birth year] DOA: 11/16/2016 PCP: Philis Fendt, MD   LOS: 2 days   Brief Narrative: [name] is a 71 y.o. 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  Assessment & Plan: Active Problems:   Scrotal mass   AKI (acute kidney injury) ([place])   Dilated cardiomyopathy (HCC)   Chronic systolic CHF (congestive heart failure) (HCC)   CKD (chronic kidney disease), stage IV (HCC)   COPD with chronic bronchitis (HCC)   Symptomatic anemia   Scrotal mass / condylomata -Urology consulted, appreciate input,  -Status post cauterization 4/5, continues to bleed, had silver nitrate applied today, appreciate urology follow-up  Symptomatic anemia -Hemoglobin on CBC was down on admission to 6.2 from mid 7-8 as an outpatient -He was transfused 2 units of packed red blood cells, improved to 7.8 today, continue to monitor  Chronic systolic CHF -Patient is being followed by Dr. Martinique from cardiology, most recent 2D echo was done in January 2018 and showed an EF of 20-25%.  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 -He appears euvolemic  Hypotension -This is chronic for patient, I doubt this is what is giving him his symptoms, on our most recent outpatient visits with cardiology his blood pressure was in the 42V-95G systolic  History of NSVT -Continue amiodarone -He has been followed by EP as an outpatient -Continues to have short bursts of NSVT on telemetry  Bilateral renal masses -Again addressed by urology, these were probably to be followed up as an outpatient  COPD -No wheezing, this seems stable, continue home  medications  AKI on chronic kidney disease stage IV -baseline creatinine between 1.7-2.0, his creatinine has improved and overall stable    DVT prophylaxis: SCD Code Status: Full code Family Communication: no family at bedside Disposition Plan: home when ready  Consultants:   Urology   Procedures:   None   Antimicrobials:  None    Subjective: - no chest pain, shortness of breath, no abdominal pain, nausea or vomiting.   Objective: Vitals:   11/17/16 1400 11/17/16 1615 11/17/16 2005 11/18/16 0422  BP: (!) 95/49 (!) 102/52 (!) 109/50 (!) 106/49  Pulse: 73 76 82 78  Resp: 18 18 18 18   Temp: 97.9 F (36.6 C) 98 F (36.7 C) 98.7 F (37.1 C) 98.4 F (36.9 C)  TempSrc: Oral Oral Oral Oral  SpO2: 98% 98% 95% 95%  Weight:      Height:        Intake/Output Summary (Last 24 hours) at 11/18/16 1237 Last data filed at 11/18/16 0400  Gross per 24 hour  Intake           1347.5 ml  Output             1025 ml  Net            322.5 ml   Filed Weights   11/16/16 1410 11/16/16 1748  Weight: 79.8 kg (176 lb) 79.5 kg (175 lb 4.3 oz)    Examination: Constitutional: NAD Vitals:   11/17/16 1400 11/17/16 1615 11/17/16 2005 11/18/16 0422  BP: (!) 95/49 (!) 102/52 (!) 109/50 (!) 106/49  Pulse: 73 76 82 78  Resp: 18 18 18  18  Temp: 97.9 F (36.6 C) 98 F (36.7 C) 98.7 F (37.1 C) 98.4 F (36.9 C)  TempSrc: Oral Oral Oral Oral  SpO2: 98% 98% 95% 95%  Weight:      Height:       Eyes: PERRL, lids and conjunctivae normal Respiratory: clear to auscultation bilaterally, no wheezing, no crackles. Normal respiratory effort. No accessory muscle use.  GU: large right scrotal mass with bloody oozing on lateral aspect Cardiovascular: Regular rate and rhythm, no murmurs / rubs / gallops. No LE edema. 2+ pedal pulses.  Abdomen: no tenderness. Bowel sounds positive.  Musculoskeletal: no clubbing / cyanosis. No joint deformity upper and lower extremities.    Data Reviewed: I have  personally reviewed following labs and imaging studies  CBC:  Recent Labs Lab 11/16/16 1454 11/16/16 1514 11/17/16 0500 11/18/16 0529  WBC 5.8  --  6.7 8.7  HGB 6.2* 7.5* 7.3* 7.8*  HCT 22.9* 22.0* 25.6* 27.3*  MCV 77.6*  --  78.5 78.0  PLT 258  --  264 921   Basic Metabolic Panel:  Recent Labs Lab 11/16/16 1454 11/16/16 1514 11/17/16 0500 11/18/16 0529  NA 135 137 137 137  K 4.0 4.0 4.0 4.4  CL 105 104 106 109  CO2 22  --  22 21*  GLUCOSE 112* 109* 104* 103*  BUN 37* 34* 38* 39*  CREATININE 2.85* 2.90* 2.39* 2.56*  CALCIUM 8.2*  --  8.3* 8.3*   GFR: Estimated Creatinine Clearance: 27.8 mL/min (A) (by C-G formula based on SCr of 2.56 mg/dL (H)). Liver Function Tests:  Recent Labs Lab 11/16/16 1454 11/17/16 0500  AST 24 22  ALT 19 17  ALKPHOS 147* 134*  BILITOT 0.9 1.0  PROT 7.0 7.1  ALBUMIN 2.2* 2.2*   Coagulation Profile:  Recent Labs Lab 11/16/16 1454  INR 1.15   Urine analysis:    Component Value Date/Time   COLORURINE YELLOW 11/16/2016 1436   APPEARANCEUR CLEAR 11/16/2016 1436   LABSPEC 1.010 11/16/2016 1436   PHURINE 5.0 11/16/2016 1436   GLUCOSEU NEGATIVE 11/16/2016 1436   HGBUR NEGATIVE 11/16/2016 1436   BILIRUBINUR NEGATIVE 11/16/2016 1436   KETONESUR NEGATIVE 11/16/2016 1436   PROTEINUR NEGATIVE 11/16/2016 1436   NITRITE NEGATIVE 11/16/2016 1436   LEUKOCYTESUR NEGATIVE 11/16/2016 1436   Radiology Studies: Dg Chest Port 1 View  Result Date: 11/16/2016 CLINICAL DATA:  Hypotension, history of scrotal lesion EXAM: PORTABLE CHEST 1 VIEW COMPARISON:  Portable chest x-ray of 06/13/2016 FINDINGS: No pneumonia or effusion is seen. Moderate cardiomegaly remains. There may be very minimal pulmonary vascular congestion present. A tiny left pleural effusion cannot be excluded. There are degenerative changes in both shoulders. IMPRESSION: Stable cardiomegaly. Questionable minimal pulmonary vascular congestion. Also, cannot exclude a tiny left pleural  effusion. Electronically Signed   By: Ivar Drape M.D.   On: 11/16/2016 17:07   Scheduled Meds: . amiodarone  200 mg Oral Daily  . carvedilol  6.25 mg Oral BID  . ferrous sulfate  325 mg Oral TID WC  . furosemide  40 mg Oral Daily  . imiquimod  1 application Topical QHS  . sodium chloride flush  3 mL Intravenous Q12H  . traMADol  50 mg Oral BID   Continuous Infusions:   Marzetta Board, MD, PhD [facility] Pager [phone]  If 7PM-7AM, please contact night-coverage [email or web address] Password [facility] 11/18/2016, 12:37 PM

## 2016-11-18 NOTE — Treatment Plan (Signed)
Patient has follow up with Dr. Mayer Camel, [facility] reconstructive urology, on Thursday 11/24/16 as outpatient at [place] at the [facility] location - [address], [place], [place], [place].  Number for patient to call if any questions = [facility] urology [phone].

## 2016-11-18 NOTE — Progress Notes (Signed)
Pt continues to take off the dressing that the RN puts on and has been touching and contaminating his wound with his hands. He is insistent on doing the dressing how he wants. He prefers a single ABD pad even though this doesn't seem to keep the bleeding at [place]. Dressing has been changed four times overnight (due to the patient taking off the nurse's dressing and putting on his own.

## 2016-11-18 NOTE — Progress Notes (Signed)
Assessment: Scrotal condyloma - I initially controlled his bleeding with hemostatic agents yesterday but evaluation today revealed he still was having some bleeding. I therefore used silver nitrate and was able to stop the bleeding from the superior aspect where the condyloma and his normal skin meet. He also has an area in the upper portion of the condyloma in between the interstices of the lesion where some bleeding has continued. I also treated this with silver nitrate. It may require several more treatments. His hemoglobin has remained stable.  Plan: 1. Continued local bleeding control attempts. 2. Arrangements have been made for him to be evaluated and treated at [facility] as an outpatient. If his bleeding is not controllable he may need to be transferred there for definitive care.    Subjective: Patient reports that he has been trying to keep the area clean and has been working on applying pressure. His nurse indicates however that he has been changing his ostomy bag and then manipulating his condyloma potentially increasing infectious risk.  Objective: Vital signs in last 24 hours: Temp:  [97.5 F (36.4 C)-98.7 F (37.1 C)] 98.4 F (36.9 C) (04/06 0422) Pulse Rate:  [62-82] 78 (04/06 0422) Resp:  [18] 18 (04/06 0422) BP: (91-109)/(49-52) 106/49 (04/06 0422) SpO2:  [95 %-100 %] 95 % (04/06 0422)A  Intake/Output from previous day: 04/05 0701 - 04/06 0700 In: 1597.5 [P.O.:1230; Blood:367.5] Out: 1225 [Urine:600; Stool:625] Intake/Output this shift: No intake/output data recorded.  Past Medical History:  Diagnosis Date  . Arthritis   . Chronic kidney disease, stage 3   . Chronic systolic CHF (congestive heart failure) ([place]) 07/04/2016   a. Echo 10/17: Moderate concentric LVH, EF 20-25, diffuse HK, anteroseptal, inferior and inferoseptal akinesis, grade 2 diastolic dysfunction, severe LAE  //  b. Echo 08/16/16: EF 20-25, severe diff HK, inf-lat, inf and inf-septal AK and scarring c/w RCA  distribution infarct, Gr 2 DD, mild MR, mod to severe LAE, mild RVE, mild RAE, PASP 31 (Nuc study neg for scar)  . Colon obstruction    Sigmoid  . COPD (chronic obstructive pulmonary disease) ([place])   . Dilated cardiomyopathy ([place])    A. LHC 7/06: normal coronary arteries  //  b. Myoview 12/17: severely dilated LV, frequent multifocal PVCs, EF < 30, no infarct or ischemia (c/w dilated NICM), high risk  . Diverticulitis   . Erectile dysfunction   . Fistula    chronic enterocutaneous  . Gout   . Hypertension   . Inguinal hernia   . NSVT (nonsustained ventricular tachycardia) (HCC)    A. admx in 85/63 with metabolic derangement in setting of septic shock - NSVT tx with Amio/Lidocaine; EF 20-25 on echo; Dc'd on beta-blocker (Coreg) only (no Amiodarone)  . Osteoarthritis   . Perforated bowel ([place])    perforation of the cecum  . Peritonitis ([place])   . Scrotal mass     Physical Exam:  Lungs - Normal respiratory effort, chest expands symmetrically.  Abdomen - Soft, non-tender & non-distended. GU - I noted some bleeding from the superior aspect of the lesion where it joins the skin in the groin on the right-hand side and also from the interstices of the lesion in its superior aspect. Currently no sign of infection.  Lab Results:  Recent Labs  11/16/16 1454 11/16/16 1514 11/17/16 0500 11/18/16 0529  WBC 5.8  --  6.7 8.7  HGB 6.2* 7.5* 7.3* 7.8*  HCT 22.9* 22.0* 25.6* 27.3*   BMET  Recent Labs  11/17/16 0500  11/18/16 0529  NA 137 137  K 4.0 4.4  CL 106 109  CO2 22 21*  GLUCOSE 104* 103*  BUN 38* 39*  CREATININE 2.39* 2.56*  CALCIUM 8.3* 8.3*   No results for input(s): LABURIN in the last 72 hours. Results for orders placed or performed during the hospital encounter of 06/07/16  Blood culture (routine x 2)     Status: None   Collection Time: 06/07/16  1:20 PM  Result Value Ref Range Status   Specimen Description BLOOD RIGHT ANTECUBITAL  Final   Special Requests BOTTLES  DRAWN AEROBIC AND ANAEROBIC  5CC  Final   Culture NO GROWTH 5 DAYS  Final   Report Status 06/12/2016 FINAL  Final  Blood culture (routine x 2)     Status: None   Collection Time: 06/07/16  3:24 PM  Result Value Ref Range Status   Specimen Description BLOOD RIGHT HAND  Final   Special Requests IN PEDIATRIC BOTTLE 2CC  Final   Culture NO GROWTH 5 DAYS  Final   Report Status 06/12/2016 FINAL  Final  MRSA PCR Screening     Status: None   Collection Time: 06/07/16  8:33 PM  Result Value Ref Range Status   MRSA by PCR NEGATIVE NEGATIVE Final    Comment:        The GeneXpert MRSA Assay (FDA approved for NASAL specimens only), is one component of a comprehensive MRSA colonization surveillance program. It is not intended to diagnose MRSA infection nor to guide or monitor treatment for MRSA infections.   Urine culture     Status: None   Collection Time: 06/07/16  8:50 PM  Result Value Ref Range Status   Specimen Description URINE, CATHETERIZED  Final   Special Requests NONE  Final   Culture NO GROWTH  Final   Report Status 06/09/2016 FINAL  Final    Studies/Results: No results found.    Deepika [place] C 11/18/2016, 7:18 AM

## 2016-11-19 ENCOUNTER — Inpatient Hospital Stay (HOSPITAL_COMMUNITY): Payer: Medicare Other | Admitting: Registered Nurse

## 2016-11-19 ENCOUNTER — Encounter (HOSPITAL_COMMUNITY)
Admission: EM | Disposition: A | Discharge status: Home / Self Care | Payer: Self-pay | Source: Home / Self Care | Attending: Internal Medicine

## 2016-11-19 ENCOUNTER — Encounter (HOSPITAL_COMMUNITY): Payer: Self-pay | Admitting: Anesthesiology

## 2016-11-19 DIAGNOSIS — D649 Anemia, unspecified: Secondary | ICD-10-CM

## 2016-11-19 HISTORY — PX: CONDYLOMA EXCISION/FULGURATION: SHX1389

## 2016-11-19 LAB — BASIC METABOLIC PANEL
Anion gap: 6 (ref 5–15)
BUN: 44 mg/dL — AB (ref 6–20)
CHLORIDE: 107 mmol/L (ref 101–111)
CO2: 23 mmol/L (ref 22–32)
Calcium: 8.3 mg/dL — ABNORMAL LOW (ref 8.9–10.3)
Creatinine, Ser: 2.42 mg/dL — ABNORMAL HIGH (ref 0.61–1.24)
GFR calc non Af Amer: 25 mL/min — ABNORMAL LOW (ref >60)
GFR, EST AFRICAN AMERICAN: 29 mL/min — AB (ref >60)
Glucose, Bld: 133 mg/dL — ABNORMAL HIGH (ref 65–99)
Potassium: 4.2 mmol/L (ref 3.5–5.1)
Sodium: 136 mmol/L (ref 135–145)

## 2016-11-19 LAB — CBC
HCT: 26.8 % — ABNORMAL LOW (ref 39.0–52.0)
Hemoglobin: 7.7 g/dL — ABNORMAL LOW (ref 13.0–17.0)
MCH: 23.3 pg — AB (ref 26.0–34.0)
MCHC: 28.7 g/dL — AB (ref 30.0–36.0)
MCV: 81 fL (ref 78.0–100.0)
Platelets: 244 10*3/uL (ref 150–400)
RBC: 3.31 MIL/uL — ABNORMAL LOW (ref 4.22–5.81)
RDW: 22.4 % — ABNORMAL HIGH (ref 11.5–15.5)
WBC: 7.8 10*3/uL (ref 4.0–10.5)

## 2016-11-19 LAB — HEMOGLOBIN AND HEMATOCRIT, BLOOD
HCT: 25.7 % — ABNORMAL LOW (ref 39.0–52.0)
HEMOGLOBIN: 7.3 g/dL — AB (ref 13.0–17.0)

## 2016-11-19 SURGERY — REMOVAL, CONDYLOMA
Anesthesia: General | Site: Scrotum | Laterality: Right

## 2016-11-19 MED ORDER — CEFAZOLIN SODIUM-DEXTROSE 2-4 GM/100ML-% IV SOLN
2.0000 g | INTRAVENOUS | Status: AC
  Administered 2016-11-19: 2 g via INTRAVENOUS

## 2016-11-19 MED ORDER — PHENYLEPHRINE 40 MCG/ML (10ML) SYRINGE FOR IV PUSH (FOR BLOOD PRESSURE SUPPORT)
PREFILLED_SYRINGE | INTRAVENOUS | Status: AC
  Filled 2016-11-19: qty 10

## 2016-11-19 MED ORDER — FENTANYL CITRATE (PF) 100 MCG/2ML IJ SOLN
INTRAMUSCULAR | Status: DC | PRN
  Administered 2016-11-19: 100 ug via INTRAVENOUS
  Administered 2016-11-19 (×2): 50 ug via INTRAVENOUS

## 2016-11-19 MED ORDER — PHENYLEPHRINE 40 MCG/ML (10ML) SYRINGE FOR IV PUSH (FOR BLOOD PRESSURE SUPPORT)
PREFILLED_SYRINGE | INTRAVENOUS | Status: DC | PRN
  Administered 2016-11-19: 80 ug via INTRAVENOUS
  Administered 2016-11-19: 120 ug via INTRAVENOUS

## 2016-11-19 MED ORDER — PHENYLEPHRINE HCL 10 MG/ML IJ SOLN
INTRAVENOUS | Status: DC | PRN
  Administered 2016-11-19: 50 ug/min via INTRAVENOUS

## 2016-11-19 MED ORDER — 0.9 % SODIUM CHLORIDE (POUR BTL) OPTIME
TOPICAL | Status: DC | PRN
  Administered 2016-11-19: 1000 mL

## 2016-11-19 MED ORDER — SUCCINYLCHOLINE CHLORIDE 200 MG/10ML IV SOSY
PREFILLED_SYRINGE | INTRAVENOUS | Status: AC
  Filled 2016-11-19: qty 10

## 2016-11-19 MED ORDER — HYDROMORPHONE HCL 1 MG/ML IJ SOLN: 0.2500 mg | INTRAMUSCULAR | Status: DC | PRN

## 2016-11-19 MED ORDER — FENTANYL CITRATE (PF) 100 MCG/2ML IJ SOLN
INTRAMUSCULAR | Status: AC
  Filled 2016-11-19: qty 2

## 2016-11-19 MED ORDER — LACTATED RINGERS IV SOLN
INTRAVENOUS | Status: DC | PRN
  Administered 2016-11-19 (×2): via INTRAVENOUS

## 2016-11-19 MED ORDER — CEFAZOLIN IN D5W 1 GM/50ML IV SOLN: 1.0000 g | INTRAVENOUS | Status: DC

## 2016-11-19 MED ORDER — EPHEDRINE SULFATE-NACL 50-0.9 MG/10ML-% IV SOSY
PREFILLED_SYRINGE | INTRAVENOUS | Status: DC | PRN
  Administered 2016-11-19: 10 mg via INTRAVENOUS
  Administered 2016-11-19: 15 mg via INTRAVENOUS

## 2016-11-19 MED ORDER — EPHEDRINE 5 MG/ML INJ
INTRAVENOUS | Status: AC
  Filled 2016-11-19: qty 10

## 2016-11-19 MED ORDER — DEXAMETHASONE SODIUM PHOSPHATE 10 MG/ML IJ SOLN
INTRAMUSCULAR | Status: AC
  Filled 2016-11-19: qty 1

## 2016-11-19 MED ORDER — PROPOFOL 10 MG/ML IV BOLUS
INTRAVENOUS | Status: DC | PRN
  Administered 2016-11-19: 120 mg via INTRAVENOUS
  Administered 2016-11-19: 30 mg via INTRAVENOUS

## 2016-11-19 MED ORDER — BACITRACIN ZINC 500 UNIT/GM EX OINT
TOPICAL_OINTMENT | CUTANEOUS | Status: AC
  Filled 2016-11-19: qty 28.35

## 2016-11-19 MED ORDER — AMOXICILLIN-POT CLAVULANATE 500-125 MG PO TABS
1.0000 | ORAL_TABLET | Freq: Three times a day (TID) | ORAL | Status: DC
  Administered 2016-11-19 – 2016-11-20 (×3): 500 mg via ORAL
  Filled 2016-11-19 (×3): qty 1

## 2016-11-19 MED ORDER — MEPERIDINE HCL 50 MG/ML IJ SOLN: 6.2500 mg | INTRAMUSCULAR | Status: DC | PRN

## 2016-11-19 MED ORDER — PHENYLEPHRINE HCL 10 MG/ML IJ SOLN
INTRAMUSCULAR | Status: AC
  Filled 2016-11-19: qty 1

## 2016-11-19 MED ORDER — CEFAZOLIN SODIUM-DEXTROSE 2-4 GM/100ML-% IV SOLN
INTRAVENOUS | Status: AC
  Filled 2016-11-19: qty 100

## 2016-11-19 MED ORDER — DEXAMETHASONE SODIUM PHOSPHATE 10 MG/ML IJ SOLN
INTRAMUSCULAR | Status: DC | PRN
  Administered 2016-11-19: 5 mg via INTRAVENOUS

## 2016-11-19 MED ORDER — LIDOCAINE 2% (20 MG/ML) 5 ML SYRINGE
INTRAMUSCULAR | Status: AC
  Filled 2016-11-19: qty 5

## 2016-11-19 MED ORDER — ONDANSETRON HCL 4 MG/2ML IJ SOLN
INTRAMUSCULAR | Status: DC | PRN
  Administered 2016-11-19: 4 mg via INTRAVENOUS

## 2016-11-19 MED ORDER — SUCCINYLCHOLINE CHLORIDE 200 MG/10ML IV SOSY
PREFILLED_SYRINGE | INTRAVENOUS | Status: DC | PRN
  Administered 2016-11-19: 80 mg via INTRAVENOUS

## 2016-11-19 MED ORDER — PROMETHAZINE HCL 25 MG/ML IJ SOLN: 6.2500 mg | INTRAMUSCULAR | Status: DC | PRN

## 2016-11-19 MED ORDER — LIDOCAINE 2% (20 MG/ML) 5 ML SYRINGE
INTRAMUSCULAR | Status: DC | PRN
  Administered 2016-11-19: 80 mg via INTRAVENOUS

## 2016-11-19 MED ORDER — ONDANSETRON HCL 4 MG/2ML IJ SOLN
INTRAMUSCULAR | Status: AC
  Filled 2016-11-19: qty 2

## 2016-11-19 SURGICAL SUPPLY — 34 items
APL SKNCLS STERI-STRIP NONHPOA (GAUZE/BANDAGES/DRESSINGS) ×2
BENZOIN TINCTURE PRP APPL 2/3 (GAUZE/BANDAGES/DRESSINGS) ×4 IMPLANT
BLADE HEX COATED 2.75 (ELECTRODE) ×4 IMPLANT
BLADE SURG 15 STRL LF DISP TIS (BLADE) ×2 IMPLANT
BLADE SURG 15 STRL SS (BLADE) ×4
BNDG GAUZE ELAST 4 BULKY (GAUZE/BANDAGES/DRESSINGS) ×4 IMPLANT
CATH URET 5FR 28IN OPEN ENDED (CATHETERS) IMPLANT
COVER SURGICAL LIGHT HANDLE (MISCELLANEOUS) ×4 IMPLANT
DRAIN PENROSE 18X1/2 LTX STRL (DRAIN) ×4 IMPLANT
DRAIN PENROSE 18X1/4 LTX STRL (WOUND CARE) ×4 IMPLANT
ELECT PENCIL ROCKER SW 15FT (MISCELLANEOUS) ×4 IMPLANT
ELECT REM PT RETURN 15FT ADLT (MISCELLANEOUS) ×4 IMPLANT
GAUZE SPONGE 4X4 12PLY STRL (GAUZE/BANDAGES/DRESSINGS) ×4 IMPLANT
GLOVE SURG SS PI 8.0 STRL IVOR (GLOVE) IMPLANT
GOWN STRL REUS W/TWL XL LVL3 (GOWN DISPOSABLE) ×4 IMPLANT
KIT BASIN OR (CUSTOM PROCEDURE TRAY) ×4 IMPLANT
NEEDLE HYPO 22GX1.5 SAFETY (NEEDLE) IMPLANT
NS IRRIG 1000ML POUR BTL (IV SOLUTION) ×3 IMPLANT
PACK BASIC VI WITH GOWN DISP (CUSTOM PROCEDURE TRAY) ×4 IMPLANT
PAD ABD 8X10 STRL (GAUZE/BANDAGES/DRESSINGS) ×3 IMPLANT
SPONGE LAP 4X18 X RAY DECT (DISPOSABLE) ×8 IMPLANT
SUPPORT SCROTAL LG STRP (MISCELLANEOUS) ×3 IMPLANT
SUPPORTER ATHLETIC LG (MISCELLANEOUS) ×1
SUT CHROMIC 3 0 SH 27 (SUTURE) ×8 IMPLANT
SUT CHROMIC 4 0 SH 27 (SUTURE) IMPLANT
SUT VIC AB 2-0 SH 18 (SUTURE) ×3 IMPLANT
SUT VIC AB 3-0 SH 18 (SUTURE) ×3 IMPLANT
SUT VIC AB 3-0 SH 27 (SUTURE) ×16
SUT VIC AB 3-0 SH 27X BRD (SUTURE) ×3 IMPLANT
SUT VIC AB 3-0 SH 27XBRD (SUTURE) ×2 IMPLANT
SUT VICRYL 0 TIES 12 18 (SUTURE) ×4 IMPLANT
SYR 50ML LL SCALE MARK (SYRINGE) ×3 IMPLANT
SYR CONTROL 10ML LL (SYRINGE) IMPLANT
WATER STERILE IRR 1500ML POUR (IV SOLUTION) IMPLANT

## 2016-11-19 NOTE — Progress Notes (Signed)
PROGRESS NOTE  [name] LKG:[id number] DOB: 07/29/1946 DOA: 11/16/2016 PCP: Philis Fendt, MD   LOS: 3 days   Brief Narrative: [name] is a 71 y.o. 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  Assessment & Plan: Active Problems:   Scrotal mass   AKI (acute kidney injury) ([place])   Dilated cardiomyopathy (HCC)   Chronic systolic CHF (congestive heart failure) (HCC)   CKD (chronic kidney disease), stage IV (HCC)   COPD with chronic bronchitis (HCC)   Symptomatic anemia   Scrotal mass / condylomata -Urology consulted, appreciate input, it appears that he will be taken to the operating room today -Status post cauterization 4/5, continues to bleed, had silver nitrate applied 4/6, however he continues to bleed  Symptomatic anemia -Hemoglobin on CBC was down on admission to 6.2 from mid 7-8 as an outpatient -He was transfused 2 units of packed red blood cells,  -Hemoglobin has remained stable, 7.7 today  Chronic systolic CHF -Patient is being followed by Dr. Martinique from cardiology, most recent 2D echo was done in January 2018 and showed an EF of 20-25%.  He is presumed to have a nonischemic cardiomyopathy as he had a normal cardiac catheterization -He appears euvolemic on exam  Hypotension -This is chronic for patient, I doubt this is what is giving him his symptoms, on our most recent outpatient visits with cardiology his blood pressure was in the 66Y-40H systolic -Stable, blood pressure this morning 102/75  History of NSVT -Continue amiodarone -He has been followed by EP as an outpatient -Continues to have short bursts of NSVT on telemetry, asymptomatic,  stable  Bilateral renal masses -Again addressed by urology, these were probably to be followed up as an outpatient  COPD -No wheezing, this seems stable, continue home medications  AKI on chronic kidney disease stage IV -baseline creatinine between 1.7-2.0, his creatinine is slightly elevated likely in the setting of ongoing anemia, stable    DVT prophylaxis: SCD Code Status: Full code Family Communication: no family at bedside Disposition Plan: home when ready  Consultants:   Urology   Procedures:   None   Antimicrobials:  None    Subjective: - no chest pain, shortness of breath, no abdominal pain, nausea or vomiting.   Objective: Vitals:   11/17/16 2005 11/18/16 0422 11/18/16 2015 11/19/16 0638  BP: (!) 109/50 (!) 106/49 (!) 101/55 102/75  Pulse: 82 78 78 77  Resp: 18 18 18 18   Temp: 98.7 F (37.1 C) 98.4 F (36.9 C) 98.1 F (36.7 C) 97.7 F (36.5 C)  TempSrc: Oral Oral Oral Oral  SpO2: 95% 95% 96% 99%  Weight:      Height:        Intake/Output Summary (Last 24 hours) at 11/19/16 1039 Last data filed at 11/19/16 4742  Gross per 24 hour  Intake                0 ml  Output              550 ml  Net             -550 ml   Filed Weights   11/16/16 1410 11/16/16 1748  Weight: 79.8 kg (176 lb) 79.5 kg (175 lb  4.3 oz)    Examination: Constitutional: NAD Vitals:   11/17/16 2005 11/18/16 0422 11/18/16 2015 11/19/16 0638  BP: (!) 109/50 (!) 106/49 (!) 101/55 102/75  Pulse: 82 78 78 77  Resp: 18 18 18 18   Temp: 98.7 F (37.1 C) 98.4 F (36.9 C) 98.1 F (36.7 C) 97.7 F (36.5 C)  TempSrc: Oral Oral Oral Oral  SpO2: 95% 95% 96% 99%  Weight:      Height:       Eyes: PERRL, lids and conjunctivae normal Respiratory: clear to auscultation bilaterally, no wheezing, no crackles. Normal respiratory effort. No accessory muscle use.  GU: large right scrotal mass with bloody oozing on lateral aspect Cardiovascular: Regular rate and rhythm, no murmurs / rubs /  gallops. No LE edema. 2+ pedal pulses.  Abdomen: no tenderness. Bowel sounds positive.  Musculoskeletal: no clubbing / cyanosis. No joint deformity upper and lower extremities.    Data Reviewed: I have personally reviewed following labs and imaging studies  CBC:  Recent Labs Lab 11/16/16 1454 11/16/16 1514 11/17/16 0500 11/18/16 0529 11/19/16 0918  WBC 5.8  --  6.7 8.7 7.8  HGB 6.2* 7.5* 7.3* 7.8* 7.7*  HCT 22.9* 22.0* 25.6* 27.3* 26.8*  MCV 77.6*  --  78.5 78.0 81.0  PLT 258  --  264 278 300   Basic Metabolic Panel:  Recent Labs Lab 11/16/16 1454 11/16/16 1514 11/17/16 0500 11/18/16 0529  NA 135 137 137 137  K 4.0 4.0 4.0 4.4  CL 105 104 106 109  CO2 22  --  22 21*  GLUCOSE 112* 109* 104* 103*  BUN 37* 34* 38* 39*  CREATININE 2.85* 2.90* 2.39* 2.56*  CALCIUM 8.2*  --  8.3* 8.3*   GFR: Estimated Creatinine Clearance: 27.8 mL/min (A) (by C-G formula based on SCr of 2.56 mg/dL (H)). Liver Function Tests:  Recent Labs Lab 11/16/16 1454 11/17/16 0500  AST 24 22  ALT 19 17  ALKPHOS 147* 134*  BILITOT 0.9 1.0  PROT 7.0 7.1  ALBUMIN 2.2* 2.2*   Coagulation Profile:  Recent Labs Lab 11/16/16 1454  INR 1.15   Urine analysis:    Component Value Date/Time   COLORURINE YELLOW 11/16/2016 1436   APPEARANCEUR CLEAR 11/16/2016 1436   LABSPEC 1.010 11/16/2016 1436   PHURINE 5.0 11/16/2016 1436   GLUCOSEU NEGATIVE 11/16/2016 1436   HGBUR NEGATIVE 11/16/2016 1436   BILIRUBINUR NEGATIVE 11/16/2016 1436   KETONESUR NEGATIVE 11/16/2016 1436   PROTEINUR NEGATIVE 11/16/2016 1436   NITRITE NEGATIVE 11/16/2016 1436   LEUKOCYTESUR NEGATIVE 11/16/2016 1436   Radiology Studies: No results found. Scheduled Meds: . amiodarone  200 mg Oral Daily  . carvedilol  6.25 mg Oral BID  . ferrous sulfate  325 mg Oral TID WC  . furosemide  40 mg Oral Daily  . imiquimod  1 application Topical QHS  . sodium chloride flush  3 mL Intravenous Q12H  . traMADol  50 mg Oral BID    Continuous Infusions:   Marzetta Board, MD, PhD [facility] Pager [phone]  If 7PM-7AM, please contact night-coverage [email or web address] Password [facility] 11/19/2016, 10:39 AM

## 2016-11-19 NOTE — Anesthesia Preprocedure Evaluation (Addendum)
Anesthesia Evaluation  Patient identified by MRN, date of birth, ID band Patient awake    Reviewed: Allergy & Precautions, NPO status , Patient's Chart, lab work & pertinent test results, reviewed documented beta blocker date and time   Airway Mallampati: III  TM Distance: >3 FB Neck ROM: Full    Dental  (+) Poor Dentition   Pulmonary shortness of breath and with exertion, COPD,  COPD inhaler, Current Smoker,    Pulmonary exam normal breath sounds clear to auscultation       Cardiovascular hypertension, Pt. on medications and Pt. on home beta blockers +CHF  Normal cardiovascular exam Rhythm:Regular Rate:Normal  Dilated nonischemic cardiomyopathy Echo 10/17: Moderate concentric LVH, EF 20-25, diffuse HK, anteroseptal, inferior and inferoseptal akinesis, grade 2 diastolic dysfunction, severe LAE  //  b. Echo 08/16/16: EF 20-25, severe diff HK, inf-lat, inf and inf-septal AK and scarring c/w RCA distribution infarct, Gr 2 DD, mild MR, mod to severe LAE, mild RVE, mild RAE, PASP 31 (Nuc study neg for scar) 20/94 with metabolic derangement in setting of septic shock - NSVT tx with Amio/Lidocaine  LHC 7/06: normal coronary arteries  //  b. Myoview 12/17: severely dilated LV, frequent multifocal PVCs, EF < 30, no infarct or ischemia (c/w dilated NICM), high risk There are no signs of a heart attack or possible blockage suggesting that there have been no changes since his cardiac catheterization in 2006.   Neuro/Psych negative neurological ROS  negative psych ROS   GI/Hepatic Neg liver ROS, Chronic enterocutaneous fistula colostomy Hx/o Diverticulitis Hx/o perforated bowel   Endo/Other    Renal/GU Renal InsufficiencyRenal disease   Erectile dysfunction Right scrotal condyloma with bleeding and fluid collection    Musculoskeletal  (+) Arthritis , Gout   Abdominal   Peds  Hematology  (+) anemia ,   Anesthesia Other Findings   Reproductive/Obstetrics                           Anesthesia Physical Anesthesia Plan  ASA: III and emergent  Anesthesia Plan: General   Post-op Pain Management:    Induction: Intravenous  Airway Management Planned: LMA  Additional Equipment:   Intra-op Plan:   Post-operative Plan: Extubation in OR  Informed Consent: I have reviewed the patients History and Physical, chart, labs and discussed the procedure including the risks, benefits and alternatives for the proposed anesthesia with the patient or authorized representative who has indicated his/her understanding and acceptance.   Dental advisory given  Plan Discussed with: Anesthesiologist, CRNA and Surgeon  Anesthesia Plan Comments:         Anesthesia Quick Evaluation

## 2016-11-19 NOTE — Op Note (Signed)
Preoperative Diagnosis: Right bleeding scrotal condyloma, bilateral hydrocele  Postoperative Diagnosis:  Same, right hydrocele appears infected and condyloma resection of possible invasive component on medial inferior aspect  Procedure(s) Performed:  1. Excision of scrotal condyloma, >10cm x 15cm  2. Bilateral hydrocele aspiration  Teaching Surgeon:  Irine Seal, MD  Resident Surgeon: Sharmaine Base, MD  Assistant(s):  None  Anesthesia:  General via ETT  Fluids:  See anesthesia record  Estimated blood loss:  100 mL  Specimens:   1. Right scrotal condyloma, gross for pathology 2. Deep medial margin for pathology 3. Inferior margin for pathology  Cultures:  1. Right hydrocele aspiration for culture  Drains:   Right superior incisional JP drain  Complications:  None  Indications:  This is a 71 y.o. 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 He has a right sided scrotal giant condyloma that continues to be irritated and bleed to the point that he was hospitalized with symptomatic anemia (6.2 Hb) requiring pRBCs. Local bedside attempts at hemostasis have failed. After discussion of the risks & benefits and alternatives to surgical approach, the patient wishes to proceed with scrotal condyloma excision. Risks & benefits of the procedure discussed with the patient, who wishes to proceed.   Findings:  Giant right scrotal condyloma, likely 15cm x 10cm in total size. Successful excision. There appeared to be an invasive component of the lesion at the medial inferior aspect of the lesion. Left-sided hydrocele aspiration with expected straw-colored clear fluid. Right-sided hydrocele aspiration with cloudy, purulent fluid. Total length of closing incision was 23 cm. No flap required as patient had excessive scrotal skin secondary to  chronic hydroceles.  Procedure:  The patient was correctly identified in the preop holding area where written informed consent as well potential risks and complications were reviewed. He agreed. He was brought back to the operative suite. Once correct information was verified, general anesthesia was induced via ETT. He was gently placed into dorsal lithotomy position with SCDs in place for VTE prophylaxis. He was prepped and draped in the usual sterile fashion and given appropriate preoperative antibiotics with Ancef. A timeout was then performed.   We began our incision at the superior aspect of the right hemiscrotal giant condyloma with Bovie electrocautery and made an elliptical weight appearing incision surrounded the entire condyloma which was irregularly shaped towards the bottom extending into the right medial upper thigh and tracking down towards the perineum. We used the level of margin of about 1 cm around the condyloma. Coxson Allis clamps were used to retract the condyloma back and then electrocautery was used to dissect through the underlying subcutaneous tissues and fat. A few bleeding vessels were suture ligated with 2-0 silk. Hemostasis was maintained throughout with Bovie electrocautery. The medial aspect of the condyloma appeared to be invasive of some sort tracking much deeper than other areas that were excised. At last the condyloma was freed from all underlying tissue and sent off for gross pathology. We then turned our attention back to the medial inferior scrotal area of question. We excised this deeper and then sent this for a margin as a deep margin. Hemostasis was again obtained. The periphery of the medial scrotal aspect of our wound appeared to be more indurated than other aspects and therefore we decided to resect further at this site as well taking approximately another 1-2 cm off from the medial  and inferior right scrotum. This tissue did appear to be more consistent with  indurated inflammatory rind more so than further condyloma. Hemostasis was again obtained and we turned our attention to the hydroceles.  Using an 18-gauge needle the right-sided hydrocele was aspirated through the scrotal skin atraumatically with production of cloudy, thick and purulent appearing fluid on return. This fluid was collected, approximately 60 mL total, and sent for culture. We then used a new 18-gauge needle and turned our attention to the left hydrocele which we felt was necessary for aspiration to achieve a more aesthetically appropriate closure. Yellow, straw colored fluid was aspirated from the left hydrocele, approximately 250 mL in total.  We then turned our attention to the closure of the wound. Primary closure was achieved. We first closed the inferior aspect of the wound with 3-0 Vicryls in an interrupted fashion, being sure to line the median raphae back up to its normal anatomic position. We then continued our reapproximation more superiorly again with 3-0 Vicryl's and interrupted fashion. The superior approximately 6-7 cm of the incision was closed in a running fashion with 3-0 Vicryl using vertical mattress suture technique. A 19 [place] JP drain was placed through the most superior aspect of the entire incision and sutured in place with nonabsorbable suture. The total length of the incision at the conclusion of the case was 23 cm. The patient was woken up from anesthesia and taken to the recovery unit for routine postoperative care.   Plan: 1. H/H in PACU. May require further pRBC to equilibrate postoperatively 2. Clears, advance diet as tolerated 3. Follow up hydrocele culture & pathology 4. Recommend keeping on Augmentin 500-125mg  BID due to purulent drainage noted from wound bed at start of case, trending CBC for next few days  Dr. Jeffie Pollock was present & scrubbed for the entire procedure.  Sharmaine Base, MD Urology Surgical Resident

## 2016-11-19 NOTE — Anesthesia Procedure Notes (Signed)
Procedure Name: Intubation Date/Time: 11/19/2016 4:12 PM Performed by: Talbot Grumbling Pre-anesthesia Checklist: Patient identified, Emergency Drugs available, Suction available and Patient being monitored Patient Re-evaluated:Patient Re-evaluated prior to inductionOxygen Delivery Method: Circle system utilized Preoxygenation: Pre-oxygenation with 100% oxygen Intubation Type: IV induction Ventilation: Mask ventilation without difficulty Laryngoscope Size: Mac and 4 (attempt with Miller 2, poor visualization) Grade View: Grade I Tube type: Oral Tube size: 7.5 mm Number of attempts: 1 Airway Equipment and Method: Stylet Placement Confirmation: ETT inserted through vocal cords under direct vision,  positive ETCO2 and breath sounds checked- equal and bilateral Secured at: 22 cm Tube secured with: Tape Dental Injury: Teeth and Oropharynx as per pre-operative assessment

## 2016-11-19 NOTE — Consult Note (Signed)
UROLOGY PROGRESS NOTES  Assessment/Plan: [name] is a 71 y.o. male with a significant PMH including multiple cardiac comorbidities that urology is following for a chronically bleeding right large scrotal condyloma (about 10cm in total size at least) in the inguinal crease with anemia to 6.2 on admission requiring blood transfusions. 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   Interval/Plan: Soft Bps (102/75) but otherwise AFVSS. 450 UOP recorded, spontaneous voiding. Hb this morning 7.7 from 7.8 yesterday but continues to significantly ooze from right scrotal condyloma. Cr 2.4.   - Keep NPO - Continue to hold anticoagulation - Continue pressure dressings and PRN silver nitrate application in interim until OR - We will plan for OR later this afternoon for scrotal condyloma excision and reconstruction, possible hydrocele aspiration, possible scrotal exploration. This plan will be discussed with the hospitalist, anesthesia, and OR teams.    Subjective: Frustrated that he continues to bleed and ooze from right scrotal/inguinal condyloma. Tolerating diet. Pain controlled. No n/v.   Objective:  Vital signs in last 24 hours: Temp:  [97.7 F (36.5 C)-98.1 F (36.7 C)] 98.1 F (36.7 C) (04/07 1321) Pulse Rate:  [77-78] 78 (04/07 1321) Resp:  [18] 18 (04/07 1321) BP: (94-102)/(41-75) 94/41 (04/07 1321) SpO2:  [96 %-100 %] 100 % (04/07 1321)  04/06 0701 - 04/07 0700 In: -  Out: 550 [Urine:450; Stool:100]    Physical Exam:  General:  well-developed and well-nourished AAM in NAD, lying in bed, alert & oriented, pleasant HEENT: [place]/AT, EOMI, sclera anicteric, hearing grossly intact, no nasal discharge, MMM, poor dentition Respiratory: nonlabored respirations, satting well on RA, symmetrical chest rise Cardiovascular: pulse regular rate & rhythm Abdominal: soft, NTTP,  nondistended, ostomy with yellow brown stool output GU: voiding spontaneously, very large frondular, raw pink and purple colored condyloma with active oozing from the deeper layers of the wound in the right hemiscrotum tracking down towards the perineum and inguinal crease, bilateral moderate hydroceles Extremities: warm, well-perfused, no c/c/e   Data Review: Results for orders placed or performed during the hospital encounter of 11/16/16 (from the past 24 hour(s))  CBC     Status: Abnormal   Collection Time: 11/19/16  9:18 AM  Result Value Ref Range   WBC 7.8 4.0 - 10.5 K/uL   RBC 3.31 (L) 4.22 - 5.81 MIL/uL   Hemoglobin 7.7 (L) 13.0 - 17.0 g/dL   HCT 26.8 (L) 39.0 - 52.0 %   MCV 81.0 78.0 - 100.0 fL   MCH 23.3 (L) 26.0 - 34.0 pg   MCHC 28.7 (L) 30.0 - 36.0 g/dL   RDW 22.4 (H) 11.5 - 15.5 %   Platelets 244 150 - 400 K/uL  Basic metabolic panel     Status: Abnormal   Collection Time: 11/19/16 10:06 AM  Result Value Ref Range   Sodium 136 135 - 145 mmol/L   Potassium 4.2 3.5 - 5.1 mmol/L   Chloride 107 101 - 111 mmol/L   CO2 23 22 - 32 mmol/L   Glucose, Bld 133 (H) 65 - 99 mg/dL   BUN 44 (H) 6 - 20 mg/dL   Creatinine, Ser 2.42 (H) 0.61 - 1.24 mg/dL   Calcium 8.3 (L) 8.9 - 10.3 mg/dL   GFR calc non Af Amer 25 (L) >60 mL/min   GFR calc Af Amer 29 (L) >60 mL/min   Anion gap 6 5 - 15    Imaging: none

## 2016-11-19 NOTE — Anesthesia Postprocedure Evaluation (Signed)
Anesthesia Post Note  Patient: Jamaul Heist Eduardo  Procedure(s) Performed: Procedure(s) (LRB): CONDYLOMA REMOVAL, SCROTAL EXPLORATION, HYDROCELE ASPIRATION (Right)  Patient location during evaluation: PACU Anesthesia Type: General Level of consciousness: awake and alert and oriented Pain management: pain level controlled Vital Signs Assessment: post-procedure vital signs reviewed and stable Respiratory status: spontaneous breathing, nonlabored ventilation, respiratory function stable and patient connected to nasal cannula oxygen Cardiovascular status: blood pressure returned to baseline and stable Postop Assessment: no signs of nausea or vomiting Anesthetic complications: no       Last Vitals:  Vitals:   11/19/16 1830 11/19/16 1845  BP: (!) 95/58 (!) 104/53  Pulse: 72 76  Resp: (!) 21 20  Temp: 37 C 37.2 C    Last Pain:  Vitals:   11/19/16 1830  TempSrc:   PainSc: 0-No pain                 Keigan Tafoya A.

## 2016-11-19 NOTE — Anesthesia Procedure Notes (Signed)
Procedure Name: LMA Insertion Date/Time: 11/19/2016 4:10 PM Performed by: Talbot Grumbling Pre-anesthesia Checklist: Patient identified, Emergency Drugs available, Suction available and Patient being monitored Patient Re-evaluated:Patient Re-evaluated prior to inductionOxygen Delivery Method: Circle system utilized Preoxygenation: Pre-oxygenation with 100% oxygen Intubation Type: IV induction Ventilation: Mask ventilation without difficulty LMA: LMA inserted LMA Size: 4.0 Number of attempts: 1 (poor seal, withdrawn and mask ventilated.) Placement Confirmation: positive ETCO2

## 2016-11-19 NOTE — Transfer of Care (Signed)
Immediate Anesthesia Transfer of Care Note  Patient: [name]  Procedure(s) Performed: Procedure(s) with comments: CONDYLOMA REMOVAL, SCROTAL EXPLORATION, HYDROCELE ASPIRATION (Right) - Nonischemic cardiomyopathy history with EF 20-25%  Patient Location: PACU  Anesthesia Type:General  Level of Consciousness:  sedated, patient cooperative and responds to stimulation  Airway & Oxygen Therapy:Patient Spontanous Breathing and Patient connected to face mask oxgen  Post-op Assessment:  Report given to PACU RN and Post -op Vital signs reviewed and stable  Post vital signs:  Reviewed and stable  Last Vitals:  Vitals:   11/19/16 1321 11/19/16 1808  BP: (!) 94/41   Pulse: 78 75  Resp: 18   Temp: 95.8 C     Complications: No apparent anesthesia complications

## 2016-11-20 DIAGNOSIS — I420 Dilated cardiomyopathy: Secondary | ICD-10-CM

## 2016-11-20 LAB — CBC
HEMATOCRIT: 24.8 % — AB (ref 39.0–52.0)
HEMOGLOBIN: 7 g/dL — AB (ref 13.0–17.0)
MCH: 23.1 pg — ABNORMAL LOW (ref 26.0–34.0)
MCHC: 28.2 g/dL — ABNORMAL LOW (ref 30.0–36.0)
MCV: 81.8 fL (ref 78.0–100.0)
Platelets: 244 10*3/uL (ref 150–400)
RBC: 3.03 MIL/uL — ABNORMAL LOW (ref 4.22–5.81)
RDW: 23.2 % — ABNORMAL HIGH (ref 11.5–15.5)
WBC: 11.2 10*3/uL — AB (ref 4.0–10.5)

## 2016-11-20 LAB — GRAM STAIN

## 2016-11-20 LAB — PREPARE RBC (CROSSMATCH)

## 2016-11-20 MED ORDER — POLYVINYL ALCOHOL 1.4 % OP SOLN
1.0000 [drp] | OPHTHALMIC | Status: DC | PRN
  Filled 2016-11-20: qty 15

## 2016-11-20 MED ORDER — AMOXICILLIN-POT CLAVULANATE 875-125 MG PO TABS: 1.0000 | ORAL_TABLET | Freq: Two times a day (BID) | ORAL | 0 refills | Status: DC

## 2016-11-20 MED ORDER — SODIUM CHLORIDE 0.9 % IV SOLN
Freq: Once | INTRAVENOUS | Status: AC
  Administered 2016-11-20: 08:00:00 via INTRAVENOUS

## 2016-11-20 NOTE — Progress Notes (Signed)
Informed patient that MD ordered JP drain to be removed. Patient requests that it be left in place until "later today". 10cc serosanguinous fluid emptied from drain. Will continue to monitor.

## 2016-11-20 NOTE — Care Management Important Message (Signed)
Important Message  Patient Details  Name: [name] MRN: [id number] Date of Birth: Sep 22, 1945   Medicare Important Message Given:  Yes    Erenest Rasher, RN 11/20/2016, 1:29 PM

## 2016-11-20 NOTE — Care Management Note (Addendum)
Case Management Note  Patient Details  Name: [name] MRN: [id number] Date of Birth: 10-25-1945  Subjective/Objective:    Scrotal mass, AKI                Action/Plan: Discharge Planning: NCM spoke to pt and dtr lives in home. Pt states he is independent at home. Drives to his appts. Has RW at home. Gets his colostomy bags in the mail for free. Has [facility] agency in the past but does not recall name.   PCP Jeanie Cooks, EDWIN   Expected Discharge Date:  11/20/16               Expected Discharge Plan:  Home/Self Care  In-House Referral:  NA  Discharge planning Services  CM Consult  Post Acute Care Choice:  NA Choice offered to:  NA  DME Arranged:  N/A DME Agency:  NA  HH Arranged:  NA HH Agency:  NA  Status of Service:  Completed, signed off  If discussed at [place] of Stay Meetings, dates discussed:    Additional Comments:  Erenest Rasher, RN 11/20/2016, 1:30 PM

## 2016-11-20 NOTE — Discharge Instructions (Signed)
Follow with Edwin A Avbuere, MD in 5-7 days ° °Please get a complete blood count and chemistry panel checked by your Primary MD at your next visit, and again as instructed by your Primary MD. Please get your medications reviewed and adjusted by your Primary MD. ° °Please request your Primary MD to go over all Hospital Tests and Procedure/Radiological results at the follow up, please get all Hospital records sent to your Prim MD by signing hospital release before you go home. ° °If you had Pneumonia of Lung problems at the Hospital: °Please get a 2 view Chest X ray done in 6-8 weeks after hospital discharge or sooner if instructed by your Primary MD. ° °If you have Congestive Heart Failure: °Please call your Cardiologist or Primary MD anytime you have any of the following symptoms:  °1) 3 pound weight gain in 24 hours or 5 pounds in 1 week  °2) shortness of breath, with or without a dry hacking cough  °3) swelling in the hands, feet or stomach  °4) if you have to sleep on extra pillows at night in order to breathe ° °Follow cardiac low salt diet and 1.5 lit/day fluid restriction. ° °If you have diabetes °Accuchecks 4 times/day, Once in AM empty stomach and then before each meal. °Log in all results and show them to your primary doctor at your next visit. °If any glucose reading is under 80 or above 300 call your primary MD immediately. ° °If you have Seizure/Convulsions/Epilepsy: °Please do not drive, operate heavy machinery, participate in activities at heights or participate in high speed sports until you have seen by Primary MD or a Neurologist and advised to do so again. ° °If you had Gastrointestinal Bleeding: °Please ask your Primary MD to check a complete blood count within one week of discharge or at your next visit. Your endoscopic/colonoscopic biopsies that are pending at the time of discharge, will also need to followed by your Primary MD. ° °Get Medicines reviewed and adjusted. °Please take all your  medications with you for your next visit with your Primary MD ° °Please request your Primary MD to go over all hospital tests and procedure/radiological results at the follow up, please ask your Primary MD to get all Hospital records sent to his/her office. ° °If you experience worsening of your admission symptoms, develop shortness of breath, life threatening emergency, suicidal or homicidal thoughts you must seek medical attention immediately by calling 911 or calling your MD immediately  if symptoms less severe. ° °You must read complete instructions/literature along with all the possible adverse reactions/side effects for all the Medicines you take and that have been prescribed to you. Take any new Medicines after you have completely understood and accpet all the possible adverse reactions/side effects.  ° °Do not drive or operate heavy machinery when taking Pain medications.  ° °Do not take more than prescribed Pain, Sleep and Anxiety Medications ° °Special Instructions: If you have smoked or chewed Tobacco  in the last 2 yrs please stop smoking, stop any regular Alcohol  and or any Recreational drug use. ° °Wear Seat belts while driving. ° °Please note °You were cared for by a hospitalist during your hospital stay. If you have any questions about your discharge medications or the care you received while you were in the hospital after you are discharged, you can call the unit and asked to speak with the hospitalist on call if the hospitalist that took care of you is not available.   Once you are discharged, your primary care physician will handle any further medical issues. Please note that NO REFILLS for any discharge medications will be authorized once you are discharged, as it is imperative that you return to your primary care physician (or establish a relationship with a primary care physician if you do not have one) for your aftercare needs so that they can reassess your need for medications and monitor your  lab values. ° °You can reach the hospitalist office at phone [phone] or fax [phone] °  °If you do not have a primary care physician, you can call [phone] for a physician referral. ° °Activity: As tolerated with Full fall precautions use walker/cane & assistance as needed ° °Diet: regular ° °Disposition Home ° ° °

## 2016-11-20 NOTE — Progress Notes (Signed)
JP drain removed per order. Patient tolerated well. Site clean, dry and intact. 4x4 gauze dressing placed over site.

## 2016-11-20 NOTE — Discharge Summary (Signed)
Physician Discharge Summary  [name] TFT:[id number] DOB: Dec 25, 1945 DOA: 11/16/2016  PCP: Philis Fendt, MD  Admit date: 11/16/2016 Discharge date: 11/20/2016  Admitted From: home Disposition:  home  Recommendations for Outpatient Follow-up:  1. Follow up with Dr. Jeffie Pollock in 1 week 2. Follow up with PCP in 1 week. Please obtain BMP/CBC in one week  Home Health: none Equipment/Devices: none  Discharge Condition: stable CODE STATUS: Full code Diet recommendation: heart healthy  HPI: [name] is a 71 y.o. 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  Patient tells me that he has been feeling fatigued and weak over the last several days, and he has noticed that his scrotal mass has been bleeding more and more recently, especially when he tries to get up and walk around.  He has no chest pain, denies any shortness of breath, and as far as his heart disease is concerned he has no complaints.  He denies any abdominal pain, no nausea or vomiting.  He denies any fever or chills.  He denies any palpitations. ED Course: In the emergency room patient is afebrile, blood pressure is in the 90s, and he is satting well on room air.  Blood work reveals a creatinine of 2.8, BNP slightly elevated 491, and her initial hemoglobin of 6.2 on a regular CBC, and 7.5 and rapid i-STAT.  He is scrotal mass appears to be bleeding.  Urology was consulted by EDP and TRH was asked for admission.  Hospital Course: Discharge Diagnoses:  Active Problems:   Scrotal mass   AKI (acute kidney injury) ([place])   Dilated cardiomyopathy ([facility])   Chronic systolic CHF (congestive heart failure) ([facility])   CKD (chronic  kidney disease), stage IV ([facility])   COPD with chronic bronchitis ([facility])   Symptomatic anemia   Scrotal mass / condylomata -patient was admitted to the hospital with symptomatic anemia due to chronic blood loss from scrotal mass.  Urology consulted, and patient was initially treated with local measures with cauterization and silver nitrate application, and then thought to be needing referral to [facility], however due to ongoing bleeding he was eventually taken to the operating room on 4/7 and he had excision of the scrotal condyloma as well as bilateral hydrocele aspiration.  He recovered well postop, had a drain which was removed on 4/8, and was cleared for discharge from urology standpoint.  Pathology is pending at the time of discharge.  There was also an area which showed to be potentially infected, and urology recommended Augmentin for a few days following discharge. Symptomatic anemia -Hemoglobin on CBC was down on admission to 6.2 from mid7-8 as an outpatient, she received a total of 2 units of packed red blood cells during this hospitalization.  Postoperative hemoglobin was 7.0, and received 1 additional unit prior to discharge.  His bleeding has resolved, and will need to repeat CBC later this week when he will be seen in office. Chronic systolic CHF -Patient is being followed by Dr. Martinique from cardiology, most recent 2D echo was done in January 2018 and showed an EF of 20-25%. 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 He appears euvolemic on exam Hypotension -This is chronic for patient,  on his most recent outpatient visits with cardiology his blood pressure was in the 76H-20N systolic History of NSVT -Continue amiodarone, Coreg.  Bilateral renal masses -Again addressed by urology, these will need to be followed up as an outpatient COPD -No wheezing, this seems stable, continue home medications AKIon chronic kidney disease stage IV -baseline  creatinine between 1.7-2.0, his creatinine is slightly elevated likely in the setting of ongoing anemia, overall stable.   Discharge Instructions  Allergies as of 11/20/2016   No Known Allergies     Medication List    TAKE these medications   albuterol (2.5 MG/3ML) 0.083% nebulizer solution Commonly known as:  PROVENTIL Take 2.5 mg by nebulization every 6 (six) hours as needed for wheezing or shortness of breath.   allopurinol 100 MG tablet Commonly known as:  ZYLOPRIM Take 100 mg by mouth daily.   amiodarone 200 MG tablet Commonly known as:  PACERONE Take 1 tablet (200 mg total) by mouth daily.   amoxicillin-clavulanate 875-125 MG tablet Commonly known as:  AUGMENTIN Take 1 tablet by mouth 2 (two) times daily.   aspirin EC 81 MG tablet Take 81 mg by mouth daily.   carvedilol 6.25 MG tablet Commonly known as:  COREG Take 1.5 tablets (9.375 mg total) by mouth 2 (two) times daily.   COMBIVENT RESPIMAT 20-100 MCG/ACT Aers respimat Generic drug:  Ipratropium-Albuterol Inhale 1 puff into the lungs every 6 (six) hours as needed for wheezing or shortness of breath.   diclofenac sodium 1 % Gel Commonly known as:  VOLTAREN Apply 2 g topically 4 (four) times daily as needed (for pain).   ferrous sulfate 325 (65 FE) MG tablet Take 325 mg by mouth 3 (three) times daily with meals.   furosemide 40 MG tablet Commonly known as:  LASIX Take 1 tablet (40 mg total) by mouth daily as needed. What changed:  reasons to take this   imiquimod 5 % cream Commonly known as:  ALDARA Apply 1 application topically at bedtime.   nystatin powder Commonly known as:  MYCOSTATIN/NYSTOP Apply 1 g topically 3 (three) times daily as needed (for itching/irritation).   tiZANidine 4 MG tablet Commonly known as:  ZANAFLEX Take 4 mg by mouth 2 (two) times daily as needed for muscle spasms.   traMADol 50 MG tablet Commonly known as:  ULTRAM Take 50 mg by mouth 2 (two) times daily.       Follow-up Information    Philis Fendt, MD. Schedule an appointment as soon as possible for a visit in 1 week(s).   Specialty:  Internal Medicine Why:  repeat CBC Contact information: [place] [address] [phone]        Malka So, MD. Schedule an appointment as soon as possible for a visit in 1 week(s).   Specialty:  Urology Contact information: [place] [address] [phone]          No Known Allergies  Consultations:  Urology   Procedures/Studies: 1. Excision of scrotal condyloma, >10cm x 15cm  2. Bilateral hydrocele aspiration  Dg Chest Port 1 View  Result Date: 11/16/2016 CLINICAL DATA:  Hypotension, history of scrotal lesion EXAM: PORTABLE CHEST 1 VIEW COMPARISON:  Portable chest x-ray of 06/13/2016 FINDINGS: No pneumonia or effusion is seen. Moderate cardiomegaly remains. There may be very minimal pulmonary vascular congestion present. A tiny left pleural effusion cannot be excluded. There are degenerative changes in both shoulders. IMPRESSION: Stable cardiomegaly. Questionable minimal pulmonary vascular congestion. Also, cannot  exclude a tiny left pleural effusion. Electronically Signed   By: Ivar Drape M.D.   On: 11/16/2016 17:07     Subjective: - no chest pain, shortness of breath, no abdominal pain, nausea or vomiting.   Discharge Exam: Vitals:   11/20/16 1121 11/20/16 1155  BP: (!) 93/47 (!) 90/46  Pulse: 74 74  Resp: 20 20  Temp: 97.7 F (36.5 C) 97.7 F (36.5 C)   Vitals:   11/19/16 2100 11/20/16 0606 11/20/16 1121 11/20/16 1155  BP: 103/67 101/63 (!) 93/47 (!) 90/46  Pulse: 79 72 74 74  Resp: 20 20 20 20   Temp: 97.3 F (36.3 C) 97.3 F (36.3 C) 97.7 F (36.5 C) 97.7 F (36.5 C)  TempSrc: Oral Oral Oral Oral  SpO2: 100% 100% 95% 95%  Weight:      Height:       General: Pt is alert, awake, not in acute distress Cardiovascular: RRR, S1/S2 +, no rubs, no gallops Respiratory: CTA  bilaterally, no wheezing, no rhonchi Abdominal: Soft, NT, ND, bowel sounds + Extremities: no edema, no cyanosis  The results of significant diagnostics from this hospitalization (including imaging, microbiology, ancillary and laboratory) are listed below for reference.    Microbiology: Recent Results (from the past 240 hour(s))  Culture, body fluid-bottle     Status: None (Preliminary result)   Collection Time: 11/19/16  5:20 PM  Result Value Ref Range Status   Specimen Description FLUID  Final   Special Requests   Final    HYDROCELE BOTTLES DRAWN AEROBIC AND ANAEROBIC Blood Culture adequate volume   Culture PENDING  Incomplete   Report Status PENDING  Incomplete  Gram stain     Status: None   Collection Time: 11/19/16  5:20 PM  Result Value Ref Range Status   Specimen Description FLUID  Final   Special Requests HYDROCELE  Final   Gram Stain   Final    RARE WBC PRESENT, PREDOMINANTLY MONONUCLEAR NO ORGANISMS SEEN Performed at [facility], [place] [address] [place], [place] [address]    Report Status 11/20/2016 FINAL  Final   Labs: BNP (last 3 results)  Recent Labs  11/16/16 1454  BNP 646.8*   Basic Metabolic Panel:  Recent Labs Lab 11/16/16 1454 11/16/16 1514 11/17/16 0500 11/18/16 0529 11/19/16 1006  NA 135 137 137 137 136  K 4.0 4.0 4.0 4.4 4.2  CL 105 104 106 109 107  CO2 22  --  22 21* 23  GLUCOSE 112* 109* 104* 103* 133*  BUN 37* 34* 38* 39* 44*  CREATININE 2.85* 2.90* 2.39* 2.56* 2.42*  CALCIUM 8.2*  --  8.3* 8.3* 8.3*   Liver Function Tests:  Recent Labs Lab 11/16/16 1454 11/17/16 0500  AST 24 22  ALT 19 17  ALKPHOS 147* 134*  BILITOT 0.9 1.0  PROT 7.0 7.1  ALBUMIN 2.2* 2.2*   No results for input(s): LIPASE, AMYLASE in the last 168 hours. No results for input(s): AMMONIA in the last 168 hours. CBC:  Recent Labs Lab 11/16/16 1454  11/17/16 0500 11/18/16 0529 11/19/16 0918 11/19/16 1826 11/20/16 0844  WBC 5.8  --  6.7 8.7 7.8   --  11.2*  HGB 6.2*  < > 7.3* 7.8* 7.7* 7.3* 7.0*  HCT 22.9*  < > 25.6* 27.3* 26.8* 25.7* 24.8*  MCV 77.6*  --  78.5 78.0 81.0  --  81.8  PLT 258  --  264 278 244  --  244  < > =  values in this interval not displayed. Cardiac Enzymes: No results for input(s): CKTOTAL, CKMB, CKMBINDEX, TROPONINI in the last 168 hours. BNP: Invalid input(s): POCBNP CBG: No results for input(s): GLUCAP in the last 168 hours. D-Dimer No results for input(s): DDIMER in the last 72 hours. Hgb A1c No results for input(s): HGBA1C in the last 72 hours. Lipid Profile No results for input(s): CHOL, HDL, LDLCALC, TRIG, CHOLHDL, LDLDIRECT in the last 72 hours. Thyroid function studies No results for input(s): TSH, T4TOTAL, T3FREE, THYROIDAB in the last 72 hours.  Invalid input(s): FREET3 Anemia work up No results for input(s): VITAMINB12, FOLATE, FERRITIN, TIBC, IRON, RETICCTPCT in the last 72 hours. Urinalysis    Component Value Date/Time   COLORURINE YELLOW 11/16/2016 1436   APPEARANCEUR CLEAR 11/16/2016 1436   LABSPEC 1.010 11/16/2016 1436   PHURINE 5.0 11/16/2016 1436   GLUCOSEU NEGATIVE 11/16/2016 1436   HGBUR NEGATIVE 11/16/2016 1436   BILIRUBINUR NEGATIVE 11/16/2016 1436   KETONESUR NEGATIVE 11/16/2016 1436   PROTEINUR NEGATIVE 11/16/2016 1436   NITRITE NEGATIVE 11/16/2016 1436   LEUKOCYTESUR NEGATIVE 11/16/2016 1436   Sepsis Labs Invalid input(s): PROCALCITONIN,  WBC,  LACTICIDVEN Microbiology Recent Results (from the past 240 hour(s))  Culture, body fluid-bottle     Status: None (Preliminary result)   Collection Time: 11/19/16  5:20 PM  Result Value Ref Range Status   Specimen Description FLUID  Final   Special Requests   Final    HYDROCELE BOTTLES DRAWN AEROBIC AND ANAEROBIC Blood Culture adequate volume   Culture PENDING  Incomplete   Report Status PENDING  Incomplete  Gram stain     Status: None   Collection Time: 11/19/16  5:20 PM  Result Value Ref Range Status   Specimen  Description FLUID  Final   Special Requests HYDROCELE  Final   Gram Stain   Final    RARE WBC PRESENT, PREDOMINANTLY MONONUCLEAR NO ORGANISMS SEEN Performed at [facility], [place] [address], [place], [place] [address]    Report Status 11/20/2016 FINAL  Final    Time coordinating discharge: 35 minutes  SIGNED:  Marzetta Board, MD  [facility] 11/20/2016, 1:27 PM Pager [phone]  If 7PM-7AM, please contact night-coverage [email or web address] Password [email or web address]

## 2016-11-20 NOTE — Consult Note (Signed)
UROLOGY PROGRESS NOTES  Assessment/Plan: [name] is a 71 y.o. male with a significant PMH including multiple cardiac comorbidities that urology is following for a chronically bleeding right large scrotal condyloma (about 10cm in total size at least) in the inguinal crease with anemia to 6.2 on admission requiring blood transfusions. 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   Interval/Plan: POD1 scrotal condyloma excision and aspiration of bilateral hydroceles. NAEON. Bps remain soft but stable, 101/63 this morning. Spontaneously voiding. 20cc total from [place]. PACU Hb was 7.3, repeat this morning was 7.0. Hospitalist team transfusing 1U pRBC. Feels better.   -- Continue Augmentin 500-125 BID x 5 days -- Continue fluff gauze and ABD Pad with overlying mesh underwear for scrotal support -- JP drain removal this morning -- Follow up hydrocele culture, gram stain does not suggest infection (no orgs) -- He would be suitable for discharge from a urologic perspective as early as later today after transfusion if felt to be stable from hospitalist standpoint. We will arrange follow up with [place] [facility] -- No longer requires [facility]rology visit that was previously scheduled on 4/12. We will contact them to cancel that appointment.   Subjective: Has done very well with surgery and postop course. Happy with result. Pain controlled. More interested in emptying ostomy bag this morning than groin.   Objective:  Vital signs in last 24 hours: Temp:  [97.3 F (36.3 C)-98.9 F (37.2 C)] 97.3 F (36.3 C) (04/08 0606) Pulse Rate:  [72-79] 72 (04/08 0606) Resp:  [18-24] 20 (04/08 0606) BP: (94-104)/(41-67) 101/63 (04/08 0606) SpO2:  [95 %-100 %] 100 % (04/08 0606)  04/07 0701 - 04/08 0700 In: 1400 [I.V.:1400] Out: 18 [Urine:200; Drains:20; Stool:250; Blood:100]    Physical  Exam:  General:  well-developed and well-nourished AAM in NAD, lying in bed, alert & oriented, pleasant HEENT: [place]/AT, EOMI, sclera anicteric, hearing grossly intact, no nasal discharge, MMM, poor dentition Respiratory: nonlabored respirations, satting well on RA, symmetrical chest rise Cardiovascular: pulse regular rate & rhythm Abdominal: soft, NTTP, nondistended, ostomy with yellow brown stool output GU: voiding spontaneously, surgical incision coursing from the right inguinal area down the right inguinal crease/lateral scrotum and then medially to the perineum across the midline [place] which is well-approximated, c/d/I with no purulent drainage, small dog-ear noted on left most aspect of surgical incision, JP drain emanating from superior most aspect of incision with minor SS drainage in bulb, no evidence of bleeding or purulent discharge, left sided scrotal condyloma  Extremities: warm, well-perfused, no c/c/e   Data Review: Results for orders placed or performed during the hospital encounter of 11/16/16 (from the past 24 hour(s))  Gram stain     Status: None   Collection Time: 11/19/16  5:20 PM  Result Value Ref Range   Specimen Description FLUID    Special Requests HYDROCELE    Gram Stain      RARE WBC PRESENT, PREDOMINANTLY MONONUCLEAR NO ORGANISMS SEEN Performed at [facility], [address] [place], [place] [address]    Report Status 11/20/2016 FINAL   Hemoglobin and hematocrit, blood     Status: Abnormal   Collection Time: 11/19/16  6:26 PM  Result Value Ref Range   Hemoglobin 7.3 (L) 13.0 - 17.0 g/dL   HCT 25.7 (L) 39.0 - 52.0 %  Type and screen [place]     Status: None (Preliminary result)  Collection Time: 11/19/16  6:30 PM  Result Value Ref Range   ABO/RH(D) O POS    Antibody Screen NEG    Sample Expiration 11/22/2016    Unit Number [id number]    Blood Component Type RED CELLS,LR    Unit division 00    Status of Unit ALLOCATED     Transfusion Status OK TO TRANSFUSE    Crossmatch Result Compatible   CBC     Status: Abnormal   Collection Time: 11/20/16  8:44 AM  Result Value Ref Range   WBC 11.2 (H) 4.0 - 10.5 K/uL   RBC 3.03 (L) 4.22 - 5.81 MIL/uL   Hemoglobin 7.0 (L) 13.0 - 17.0 g/dL   HCT 24.8 (L) 39.0 - 52.0 %   MCV 81.8 78.0 - 100.0 fL   MCH 23.1 (L) 26.0 - 34.0 pg   MCHC 28.2 (L) 30.0 - 36.0 g/dL   RDW 23.2 (H) 11.5 - 15.5 %   Platelets 244 150 - 400 K/uL  Prepare RBC     Status: None   Collection Time: 11/20/16  8:50 AM  Result Value Ref Range   Order Confirmation ORDER PROCESSED BY BLOOD BANK     Imaging: none

## 2016-11-21 ENCOUNTER — Encounter (HOSPITAL_COMMUNITY): Payer: Self-pay | Admitting: Urology

## 2016-11-21 LAB — BPAM RBC
BLOOD PRODUCT EXPIRATION DATE: 201804252359
ISSUE DATE / TIME: 201804081131
Unit Type and Rh: 5100

## 2016-11-21 LAB — TYPE AND SCREEN
ABO/RH(D): O POS
ANTIBODY SCREEN: NEGATIVE
Unit division: 0

## 2016-11-22 ENCOUNTER — Encounter (HOSPITAL_COMMUNITY): Payer: Self-pay | Admitting: Urology

## 2016-11-25 LAB — CULTURE, BODY FLUID W GRAM STAIN -BOTTLE: Culture: NO GROWTH

## 2016-11-25 LAB — CULTURE, BODY FLUID-BOTTLE

## 2016-12-23 ENCOUNTER — Other Ambulatory Visit (HOSPITAL_COMMUNITY): Payer: Self-pay | Admitting: Urology

## 2016-12-23 DIAGNOSIS — D49512 Neoplasm of unspecified behavior of left kidney: Secondary | ICD-10-CM

## 2016-12-23 DIAGNOSIS — D4102 Neoplasm of uncertain behavior of left kidney: Secondary | ICD-10-CM

## 2017-01-02 ENCOUNTER — Ambulatory Visit: Payer: Medicare Other | Admitting: Cardiology

## 2017-01-02 NOTE — Progress Notes (Deleted)
Electrophysiology Office Note   Date:  01/02/2017   ID:  [name], DOB 05-19-1946, MRN [id number]  PCP:  [name], MD  Cardiologist:  [place] Primary Electrophysiologist:  Constance Haw, MD    No chief complaint on file.    History of Present Illness: [name] is a 70 y.o. male who presents today for electrophysiology evaluation.   Hx NICM, systolic CHF, CKD 3, COPD, diverticulitis with prior perforation s/p colostomy.  He has a hx of severe sepsis in the setting of perforated colon c/b HCAP 2/2 resistant Pseudomonas (Trach eventually placed). EF was 26% by Myoview. LHC at that time demonstrated no CAD.   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  He developed shock and respiratory failure and was intubated. He was supported with vasopressors. Patient was noted to have frequent ectopy and long runs of nonsustained ventricular tachycardia. He was seen by cardiology. He was placed on IV amiodarone as well as IV lidocaine.  Lidocaine was then discontinued.  Echocardiogram demonstrated EF 20-25 with anteroseptal, inferior and inferoseptal akinesis. Given his significant medical illness, he was not felt to be a candidate for cardiac catheterization. With his chronic kidney disease, he was not placed on ACE inhibitor. He was placed on beta blocker with carvedilol. Amiodarone was ultimately discontinued. Of note, his scrotal mass was evaluated by urology and notes indicate he would eventually require surgical excision.  Today, denies symptoms of palpitations, chest pain, shortness of breath, orthopnea, PND, lower extremity edema, claudication, dizziness, presyncope, syncope, bleeding, or neurologic sequela. The patient is tolerating medications without difficulties and is otherwise without complaint today.  ***   Past Medical History:  Diagnosis Date  . Arthritis   . Chronic kidney disease, stage 3   . Chronic systolic CHF (congestive heart failure) ([place]) 07/04/2016   a. Echo 10/17: Moderate concentric LVH, EF 20-25, diffuse HK, anteroseptal, inferior and inferoseptal akinesis, grade 2 diastolic dysfunction, severe LAE  //  b. Echo 08/16/16: EF 20-25, severe diff HK, inf-lat, inf and inf-septal AK and scarring c/w RCA distribution infarct, Gr 2 DD, mild MR, mod to severe LAE, mild RVE, mild RAE, PASP 31 (Nuc study neg for scar)  . Colon obstruction (HCC)    Sigmoid  . COPD (chronic obstructive pulmonary disease) ([place])   . Dilated cardiomyopathy ([place])    A. LHC 7/06: normal coronary arteries  //  b. Myoview 12/17: severely dilated LV, frequent multifocal PVCs, EF < 30, no infarct or ischemia (c/w dilated NICM), high risk  . Diverticulitis   . Erectile dysfunction   . Fistula    chronic enterocutaneous  . Gout   . Hypertension   . Inguinal hernia   . NSVT (nonsustained ventricular tachycardia) (HCC)    A. admx in 36/62 with metabolic derangement in setting of septic shock - NSVT tx with Amio/Lidocaine; EF 20-25 on echo; Dc'd on beta-blocker (Coreg) only (no Amiodarone)  . Osteoarthritis   . Perforated bowel ([place])    perforation of the cecum  . Peritonitis ([place])   . Scrotal mass    Past Surgical History:  Procedure Laterality Date  . COLON SURGERY    . COLOSTOMY    . CONDYLOMA EXCISION/FULGURATION Right 11/19/2016   Procedure: CONDYLOMA REMOVAL, SCROTAL EXPLORATION, HYDROCELE ASPIRATION;  Surgeon: Irine Seal, MD;  Location: [facility];  Service: Urology;  Laterality: Right;  Nonischemic cardiomyopathy history with EF 20-25%     Current Outpatient Prescriptions  Medication Sig Dispense Refill  . albuterol (PROVENTIL) (2.5 MG/3ML) 0.083% nebulizer solution Take 2.5 mg by nebulization every 6 (six) hours as needed for wheezing or shortness of breath.    . allopurinol (ZYLOPRIM) 100 MG tablet  Take 100 mg by mouth daily.    Marland Kitchen amiodarone (PACERONE) 200 MG tablet Take 1 tablet (200 mg total) by mouth daily. 30 tablet 6  . amoxicillin-clavulanate (AUGMENTIN) 875-125 MG tablet Take 1 tablet by mouth 2 (two) times daily. 10 tablet 0  . aspirin EC 81 MG tablet Take 81 mg by mouth daily.    . carvedilol (COREG) 6.25 MG tablet Take 1.5 tablets (9.375 mg total) by mouth 2 (two) times daily. 252 tablet 3  . diclofenac sodium (VOLTAREN) 1 % GEL Apply 2 g topically 4 (four) times daily as needed (for pain).     . ferrous sulfate 325 (65 FE) MG tablet Take 325 mg by mouth 3 (three) times daily with meals.    . furosemide (LASIX) 40 MG tablet Take 1 tablet (40 mg total) by mouth daily as needed. (Patient taking differently: Take 40 mg by mouth daily as needed for edema. ) 30 tablet 0  . imiquimod (ALDARA) 5 % cream Apply 1 application topically at bedtime.    . Ipratropium-Albuterol (COMBIVENT RESPIMAT) 20-100 MCG/ACT AERS respimat Inhale 1 puff into the lungs every 6 (six) hours as needed for wheezing or shortness of breath.    . nystatin (MYCOSTATIN/NYSTOP) powder Apply 1 g topically 3 (three) times daily as needed (for itching/irritation).     Marland Kitchen tiZANidine (ZANAFLEX) 4 MG tablet Take 4 mg by mouth 2 (two) times daily as needed for muscle spasms.     . traMADol (ULTRAM) 50 MG tablet Take 50 mg by mouth 2 (two) times daily.      No current facility-administered medications for this visit.     Allergies:   Patient has no known allergies.   Social History:  The patient  reports that he has been smoking.  He has been smoking about 0.25 packs per day. He has never used smokeless tobacco. He reports that he does not drink alcohol.   Family History:  The patient's Family history is unknown by patient.    ROS:  Please see the history of present illness.   Otherwise, review of systems is positive for ***.   All other systems are reviewed and negative.    PHYSICAL EXAM: VS:  There were no vitals  taken for this visit. , BMI There is no height or weight on file to calculate BMI. GEN: Well nourished, well developed, in no acute distress  HEENT: normal  Neck: no JVD, carotid bruits, or masses Cardiac: ***RRR; no murmurs, rubs, or gallops,no edema  Respiratory:  clear to auscultation bilaterally, normal work of breathing GI: soft, nontender, nondistended, + BS MS: no deformity or atrophy  Skin: warm and dry, ***device site well healed Neuro:  Strength and sensation are intact Psych: euthymic mood, full affect  EKG:  EKG is ordered today. Personal review of the ekg ordered *** shows ***  Recent Labs: 06/14/2016: Magnesium 1.9 11/16/2016: B Natriuretic Peptide 491.1 11/17/2016: ALT 17 11/19/2016: BUN 44; Creatinine, Ser 2.42; Potassium 4.2; Sodium 136 11/20/2016: Hemoglobin 7.0; Platelets 244    Lipid Panel  No results found for: CHOL, TRIG, HDL, CHOLHDL, VLDL, LDLCALC, LDLDIRECT   Wt Readings from Last 3 Encounters:  11/16/16 175  lb 4.3 oz (79.5 kg)  10/04/16 178 lb 12.8 oz (81.1 kg)  09/23/16 175 lb 6.4 oz (79.6 kg)      Other studies Reviewed: Additional studies/ records that were reviewed today include: TTE 08/17/15, Holter 08/03/16, Myoview 08/03/16  Review of the above records today demonstrates:  - Left ventricle: The cavity size was mildly dilated. Systolic   function was severely reduced. The estimated ejection fraction   was in the range of 20% to 25%. Severe diffuse hypokinesis with   distinct regional wall motion abnormalities. Akinesis and   scarring of the inferolateral, inferior, and inferoseptal   myocardium; consistent with infarction in the distribution of the   right coronary artery. Features are consistent with a   pseudonormal left ventricular filling pattern, with concomitant   abnormal relaxation and increased filling pressure (grade 2   diastolic dysfunction). - Mitral valve: There was mild regurgitation. - Left atrium: The atrium was moderately to  severely dilated. - Right ventricle: Systolic function was mildly reduced. - Right atrium: The atrium was mildly dilated. - Atrial septum: The septum bowed from left to right, consistent   with increased left atrial pressure. No defect or patent foramen   ovale was identified. - Pulmonary arteries: PA peak pressure: 31 mm Hg (S).   Normal sinus rhythm  Sinus brady with lowest rate 48 bpm  Very frequent multiform PVCs with bigeminy, trigeminy and runs of NSVT up to 26 beats.  Occasional PACs   There was no ST segment deviation noted during stress.  This is a high risk study.  The left ventricular ejection fraction is severely decreased (<30%).   LV is severely dilated. There are very frequent multifocal PVCs during the stress test. No evidence for infarct or ischemia. 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     ASSESSMENT AND PLAN:  1.  Dilated cardiomyopathy: Currently on coreg but not on an ACEI, ARB, or nitrates and hydralazine.  His creatinine had been elevated in the past. Would like to try and decrease PVC burden and get him on OMT prior to ICD therapy.  Not having any issues from his amiodarone therapy. Sukhdeep Wieting order a 48 hour monitor to see if his PVC burden has decreased. ***  2. Chronic systolic HF: Class 2b symptoms.  Unfortunately he has not tolerated blood pressure medications passed his carvedilol. ***  3. Nonsustained BH:ALPFXT approximately 37% PVCs with nonsustained most recent Holter monitor. It is possible that his dilated cardiomyopathy is somewhat related to hisburden of PVCs. Tolerating his amiodarone. We'll order a 48 hour monitor to further determine if his amiodarone is suppressing his PVCs. ***   Current medicines are reviewed at length with the patient today.   The patient does not have concerns regarding his medicines.  The following changes were made today:  ***  Labs/ tests ordered today include:  No orders of the  defined types were placed in this encounter.    Disposition:   FU with Keslie Gritz *** months  Signed, Terron Merfeld Meredith Leeds, MD  01/02/2017 9:01 AM     [facility] [address] [address] [phone] (office) [phone] (fax)

## 2017-01-20 ENCOUNTER — Encounter (HOSPITAL_COMMUNITY): Payer: Self-pay

## 2017-01-20 ENCOUNTER — Ambulatory Visit (HOSPITAL_COMMUNITY)
Admission: RE | Admit: 2017-01-20 | Discharge: 2017-01-20 | Disposition: A | Discharge status: Home / Self Care | Payer: Medicare Other | Source: Ambulatory Visit | Attending: Urology | Admitting: Urology

## 2017-01-25 NOTE — Progress Notes (Signed)
Cardiology Office Note:    Date:  01/26/2017   ID:  [name], DOB 12-Dec-1945, MRN [id number]  PCP:  [name], MD  Cardiologist:  Dr. Merilee Wible [place]   Electrophysiologist:  Dr Curt Bears Urologist: Dr. Alinda Money  Referring MD: [name], MD   Chief Complaint  Patient presents with  . Congestive Heart Failure    History of Present Illness:    [name] is a 71 y.o. male with a hx of NICM, systolic CHF, CKD 3, COPD, diverticulitis with prior perforation s/p colostomy.   EF was 26% by Myoview. LHC at that time demonstrated no CAD.    He was admitted 10/24-11/1 with sepsis and ARF (K >7.5, CO2 8, Creatinine 11.31)  possible intra-abdominal abscess.  He developed shock and respiratory failure and was intubated. He was supported with vasopressors. Patient was noted to have frequent ectopy and long runs of nonsustained ventricular tachycardia. He was seen by cardiology. He was placed on IV amiodarone as well as IV lidocaine.  Lidocaine was then discontinued.  Echocardiogram demonstrated EF 20-25 with anteroseptal, inferior and inferoseptal akinesis. Given his significant medical illness, he was not felt to be a candidate for cardiac catheterization. With his chronic kidney disease, he was not placed on ACE inhibitor. He was placed on beta blocker with carvedilol. Amiodarone was ultimately discontinued. 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  Intra-abdominal fluid collection was not felt to be an abscess but fluid-filled bowel. Serum creatinine was 1.74 at discharge.  He was later seen by Richardson Dopp PA. Holter showed frequent PVCs and NSVT with burden of 37%. There was concern that this may be impacting his cardiomyopathy. He was seen by Dr Curt Bears  and placed back on amiodarone. Creatinine down to 1.55. He felt ablation was not a good option given multiple morphologies of PVCs. He was seen by Dr. Curt Bears in February. Holter monitor  repeated and showed marked improvement in PVC burden down to 3.6%.   He was admitted in April 2018 with severe anemia related to bleeding from a scrotal mass. Hgb down to 6.2. He underwent surgical excision or the scrotal condylomata/ drainage of bilateral hydroceles. Transfused 3 units PRBCs. No CHF post op. BP tended to run low.   On follow up today he is seen with his son. He reports he is doing very well.  Denies dyspnea, orthopnea, edema, chest pain, palpitations. No dizziness or syncope. Reports follow up Hgb with primary care.     Past Medical History:  Diagnosis Date  . Arthritis   . Chronic kidney disease, stage 3   . Chronic systolic CHF (congestive heart failure) ([place]) 07/04/2016   a. Echo 10/17: Moderate concentric LVH, EF 20-25, diffuse HK, anteroseptal, inferior and inferoseptal akinesis, grade 2 diastolic dysfunction, severe LAE  //  b. Echo 08/16/16: EF 20-25, severe diff HK, inf-lat, inf and inf-septal AK and scarring c/w RCA distribution infarct, Gr 2 DD, mild MR, mod to severe LAE, mild RVE, mild RAE, PASP 31 (Nuc study neg for scar)  . Colon obstruction (HCC)    Sigmoid  . COPD (chronic obstructive pulmonary disease) ([place])   . Dilated cardiomyopathy ([place])    A. LHC 7/06: normal coronary arteries  //  b. Myoview 12/17: severely dilated LV, frequent multifocal PVCs, EF < 30, no infarct or ischemia (c/w dilated NICM), high risk  . Diverticulitis   . Erectile dysfunction   . Fistula    chronic enterocutaneous  .  Gout   . Hypertension   . Inguinal hernia   . NSVT (nonsustained ventricular tachycardia) (HCC)    A. admx in 00/86 with metabolic derangement in setting of septic shock - NSVT tx with Amio/Lidocaine; EF 20-25 on echo; Dc'd on beta-blocker (Coreg) only (no Amiodarone)  . Osteoarthritis   . Perforated bowel ([place])    perforation of the cecum  . Peritonitis ([place])   . Scrotal mass     Past Surgical History:  Procedure Laterality Date  . COLON SURGERY    .  COLOSTOMY    . CONDYLOMA EXCISION/FULGURATION Right 11/19/2016   Procedure: CONDYLOMA REMOVAL, SCROTAL EXPLORATION, HYDROCELE ASPIRATION;  Surgeon: Irine Seal, MD;  Location: [facility];  Service: Urology;  Laterality: Right;  Nonischemic cardiomyopathy history with EF 20-25%    Current Medications: Current Meds  Medication Sig  . albuterol (PROVENTIL) (2.5 MG/3ML) 0.083% nebulizer solution Take 2.5 mg by nebulization every 6 (six) hours as needed for wheezing or shortness of breath.  . allopurinol (ZYLOPRIM) 100 MG tablet Take 100 mg by mouth daily.  Marland Kitchen amiodarone (PACERONE) 200 MG tablet Take 1 tablet (200 mg total) by mouth daily.  Marland Kitchen amoxicillin-clavulanate (AUGMENTIN) 875-125 MG tablet Take 1 tablet by mouth 2 (two) times daily.  Marland Kitchen aspirin EC 81 MG tablet Take 81 mg by mouth daily.  . carvedilol (COREG) 6.25 MG tablet Take 1.5 tablets (9.375 mg total) by mouth 2 (two) times daily.  . diclofenac sodium (VOLTAREN) 1 % GEL Apply 2 g topically 4 (four) times daily as needed (for pain).   . ferrous sulfate 325 (65 FE) MG tablet Take 325 mg by mouth 3 (three) times daily with meals.  . furosemide (LASIX) 40 MG tablet Take 40 mg by mouth daily.  . imiquimod (ALDARA) 5 % cream Apply 1 application topically at bedtime.  . Ipratropium-Albuterol (COMBIVENT RESPIMAT) 20-100 MCG/ACT AERS respimat Inhale 1 puff into the lungs every 6 (six) hours as needed for wheezing or shortness of breath.  . nystatin (MYCOSTATIN/NYSTOP) powder Apply 1 g topically 3 (three) times daily as needed (for itching/irritation).   Marland Kitchen tiZANidine (ZANAFLEX) 4 MG tablet Take 4 mg by mouth 2 (two) times daily as needed for muscle spasms.   . traMADol (ULTRAM) 50 MG tablet Take 50 mg by mouth 2 (two) times daily.   . [DISCONTINUED] amiodarone (PACERONE) 200 MG tablet Take 1 tablet (200 mg total) by mouth daily.  . [DISCONTINUED] carvedilol (COREG) 6.25 MG tablet Take 1.5 tablets (9.375 mg total) by mouth 2 (two) times daily.      Allergies:   Patient has no known allergies.   Social History   Social History  . Marital status: Divorced    Spouse name: N/A  . Number of children: N/A  . Years of education: N/A   Social History Main Topics  . Smoking status: Current Some Day Smoker    Packs/day: 0.25  . Smokeless tobacco: Never Used  . Alcohol use No  . Drug use: Unknown  . Sexual activity: Not Asked   Other Topics Concern  . None   Social History Narrative  . None     Family History:  The patient's Family history is unknown by patient.   ROS:   Please see the history of present illness.    Review of Systems   All other systems reviewed and are negative.   EKGs/Labs/Other Test Reviewed:    EKG:  EKG is not  ordered today.    Recent Labs: 06/14/2016:  Magnesium 1.9 11/16/2016: B Natriuretic Peptide 491.1 11/17/2016: ALT 17 11/19/2016: BUN 44; Creatinine, Ser 2.42; Potassium 4.2; Sodium 136 11/20/2016: Hemoglobin 7.0; Platelets 244   Recent Lipid Panel No results found for: CHOL, TRIG, HDL, CHOLHDL, VLDL, LDLCALC, LDLDIRECT   Holter February 2018: Study Highlights   Minimum HR: 40 BPM at 3:09:46 PM(2) Maximum HR: 112 BPM at 11:17:41 PM(2) Average HR: 71 BPM 3.6% PVC burden PVC runs of 2-3 beats No other arrhythmias noted      Physical Exam:    VS:  BP 98/62   Pulse (!) 52   Ht 5\' 11"  (1.803 m)   Wt 176 lb 3.2 oz (79.9 kg)   SpO2 97%   BMI 24.57 kg/m   I repeated BP manually and got 76/46 bilaterally.  Wt Readings from Last 3 Encounters:  01/26/17 176 lb 3.2 oz (79.9 kg)  11/16/16 175 lb 4.3 oz (79.5 kg)  10/04/16 178 lb 12.8 oz (81.1 kg)     Physical Exam  Constitutional: He is oriented to person, place, and time. He appears well-developed and well-nourished. No distress.  HENT:  Head: Normocephalic and atraumatic.  Eyes: No scleral icterus.  Neck: No JVD present.  Cardiovascular: Normal rate, regular rhythm and normal heart sounds.   No murmur heard. Pulmonary/Chest:  Effort normal. He has no wheezes. He has no rales.  Abdominal: Soft. There is no tenderness.  Colostomy in place   Musculoskeletal: He exhibits no edema.  Neurological: He is alert and oriented to person, place, and time.  Skin: Skin is warm and dry.  Psychiatric: He has a normal mood and affect.    ASSESSMENT:    1. Chronic systolic CHF (congestive heart failure) ([place])   2. PVC's (premature ventricular contractions)   3. Dilated cardiomyopathy ([place])   4. NSVT (nonsustained ventricular tachycardia) ([place])   5. CKD (chronic kidney disease) stage 3, GFR 30-59 ml/min    PLAN:    In order of problems listed above:  1. Dilated cardiomyopathy - EF 20-25%. Myoview showed normal perfusion.  He is only on Coreg 9.375  mg bid. BP is too low for ACEi and also concern with CKD.  ? How much ventricular ectopy was impacting LV function. Will repeat Echo now that PVCs are well controlled on amiodarone.  2. Chronic systolic CHF -.  Volume appears stable on exam today.   -  No ACE inhibitor secondary to CKD and hypotension  -   Carvedilol at same dose   -  I don't think he will tolerate hydalazine/nitrates due to hypotension.  3. Nonsustained VT -  beta-blocker as noted. Now on amiodarone per EP. Repeat Holter shows good PVC suppression with amiodarone.   4.  CKD - stage 3  Will follow up in 6 months.  Medication Adjustments/Labs and Tests Ordered: Current medicines are reviewed at length with the patient today.  Concerns regarding medicines are outlined above.  Medication changes, Labs and Tests ordered today are outlined in the Patient Instructions noted below. Patient Instructions  We will schedule you for an Echocardiogram  Continue your current therapy  I will see you in 6 months.     Signed, Jakie Debow [place], MD  01/26/2017 10:22 AM    [facility]

## 2017-01-26 ENCOUNTER — Encounter: Payer: Self-pay | Admitting: Cardiology

## 2017-01-26 ENCOUNTER — Ambulatory Visit (INDEPENDENT_AMBULATORY_CARE_PROVIDER_SITE_OTHER): Payer: Medicare Other | Admitting: Cardiology

## 2017-01-26 VITALS — BP 98/62 | HR 52 | Ht 71.0 in | Wt 176.2 lb

## 2017-01-26 DIAGNOSIS — N183 Chronic kidney disease, stage 3 unspecified: Secondary | ICD-10-CM

## 2017-01-26 DIAGNOSIS — I4729 Other ventricular tachycardia: Secondary | ICD-10-CM

## 2017-01-26 DIAGNOSIS — I255 Ischemic cardiomyopathy: Secondary | ICD-10-CM

## 2017-01-26 DIAGNOSIS — I493 Ventricular premature depolarization: Secondary | ICD-10-CM

## 2017-01-26 DIAGNOSIS — I472 Ventricular tachycardia: Secondary | ICD-10-CM

## 2017-01-26 DIAGNOSIS — I5022 Chronic systolic (congestive) heart failure: Secondary | ICD-10-CM

## 2017-01-26 DIAGNOSIS — I420 Dilated cardiomyopathy: Secondary | ICD-10-CM

## 2017-01-26 MED ORDER — CARVEDILOL 6.25 MG PO TABS: 9.3750 mg | ORAL_TABLET | Freq: Two times a day (BID) | ORAL | 3 refills | Status: AC

## 2017-01-26 MED ORDER — AMIODARONE HCL 200 MG PO TABS: 200.0000 mg | ORAL_TABLET | Freq: Every day | ORAL | 3 refills | Status: DC

## 2017-01-26 NOTE — Patient Instructions (Signed)
We will schedule you for an Echocardiogram  Continue your current therapy  I will see you in 6 months. 

## 2017-01-27 ENCOUNTER — Other Ambulatory Visit (HOSPITAL_COMMUNITY): Payer: Self-pay | Admitting: Urology

## 2017-01-27 DIAGNOSIS — D49511 Neoplasm of unspecified behavior of right kidney: Secondary | ICD-10-CM

## 2017-01-27 DIAGNOSIS — C4492 Squamous cell carcinoma of skin, unspecified: Secondary | ICD-10-CM

## 2017-01-27 DIAGNOSIS — D49512 Neoplasm of unspecified behavior of left kidney: Secondary | ICD-10-CM

## 2017-01-31 ENCOUNTER — Ambulatory Visit (INDEPENDENT_AMBULATORY_CARE_PROVIDER_SITE_OTHER): Payer: Medicare Other | Admitting: Cardiology

## 2017-01-31 ENCOUNTER — Encounter: Payer: Self-pay | Admitting: Cardiology

## 2017-01-31 VITALS — BP 102/66 | HR 53 | Ht 71.0 in | Wt 174.0 lb

## 2017-01-31 DIAGNOSIS — I255 Ischemic cardiomyopathy: Secondary | ICD-10-CM

## 2017-01-31 DIAGNOSIS — I428 Other cardiomyopathies: Secondary | ICD-10-CM

## 2017-01-31 DIAGNOSIS — Z79899 Other long term (current) drug therapy: Secondary | ICD-10-CM

## 2017-01-31 DIAGNOSIS — I493 Ventricular premature depolarization: Secondary | ICD-10-CM

## 2017-01-31 NOTE — Progress Notes (Signed)
Electrophysiology Office Note   Date:  01/31/2017   ID:  EIAN VANDERVELDEN, DOB Oct 23, 1945, MRN [id number]  PCP:  [name], MD  Cardiologist:  [place] Primary Electrophysiologist:  Constance Haw, MD    Chief Complaint  Patient presents with  . Follow-up    PVC's/NSVT     History of Present Illness: [name] is a 71 y.o. male who presents today for electrophysiology evaluation.   Hx NICM, systolic CHF, CKD 3, COPD, diverticulitis with prior perforation s/p colostomy.  He has a hx of severe sepsis in the setting of perforated colon c/b HCAP 2/2 resistant Pseudomonas (Trach eventually placed). EF was 26% by Myoview. LHC at that time demonstrated no CAD.   Was admitted on 11/20/16 with symptomatic anemia. He was bleeding from his scrotal mass/condyloma. During that admission, he had surgery for excision of the scrotal condyloma as well as bilateral hydrocele aspiration.  Today, denies symptoms of palpitations, chest pain, shortness of breath, orthopnea, PND, lower extremity edema, claudication, dizziness, presyncope, syncope, bleeding, or neurologic sequela. The patient is tolerating medications without difficulties and is otherwise without complaint today. He does not have a feeling of palpitations and is unaware of his PVCs.    Past Medical History:  Diagnosis Date  . Arthritis   . Chronic kidney disease, stage 3   . Chronic systolic CHF (congestive heart failure) ([place]) 07/04/2016   a. Echo 10/17: Moderate concentric LVH, EF 20-25, diffuse HK, anteroseptal, inferior and inferoseptal akinesis, grade 2 diastolic dysfunction, severe LAE  //  b. Echo 08/16/16: EF 20-25, severe diff HK, inf-lat, inf and inf-septal AK and scarring c/w RCA distribution infarct, Gr 2 DD, mild MR, mod to severe LAE, mild RVE, mild RAE, PASP 31 (Nuc study neg for scar)  . Colon obstruction (HCC)    Sigmoid  . COPD (chronic obstructive pulmonary disease) ([place])   . Dilated cardiomyopathy  ([place])    A. LHC 7/06: normal coronary arteries  //  b. Myoview 12/17: severely dilated LV, frequent multifocal PVCs, EF < 30, no infarct or ischemia (c/w dilated NICM), high risk  . Diverticulitis   . Erectile dysfunction   . Fistula    chronic enterocutaneous  . Gout   . Hypertension   . Inguinal hernia   . NSVT (nonsustained ventricular tachycardia) (HCC)    A. admx in 40/98 with metabolic derangement in setting of septic shock - NSVT tx with Amio/Lidocaine; EF 20-25 on echo; Dc'd on beta-blocker (Coreg) only (no Amiodarone)  . Osteoarthritis   . Perforated bowel ([place])    perforation of the cecum  . Peritonitis ([place])   . Scrotal mass    Past Surgical History:  Procedure Laterality Date  . COLON SURGERY    . COLOSTOMY    . CONDYLOMA EXCISION/FULGURATION Right 11/19/2016   Procedure: CONDYLOMA REMOVAL, SCROTAL EXPLORATION, HYDROCELE ASPIRATION;  Surgeon: Irine Seal, MD;  Location: [facility];  Service: Urology;  Laterality: Right;  Nonischemic cardiomyopathy history with EF 20-25%     Current Outpatient Prescriptions  Medication Sig Dispense Refill  . albuterol (PROVENTIL) (2.5 MG/3ML) 0.083% nebulizer solution Take 2.5 mg by nebulization every 6 (six) hours as needed for wheezing or shortness of breath.    . allopurinol (ZYLOPRIM) 100 MG tablet Take 100 mg by mouth daily.    Marland Kitchen amiodarone (PACERONE) 200 MG tablet Take 1 tablet (200 mg total) by mouth daily. 90 tablet 3  . aspirin EC 81 MG tablet Take 81 mg by mouth daily.    Marland Kitchen  carvedilol (COREG) 6.25 MG tablet Take 1.5 tablets (9.375 mg total) by mouth 2 (two) times daily. 270 tablet 3  . diclofenac sodium (VOLTAREN) 1 % GEL Apply 2 g topically 4 (four) times daily as needed (for pain).     . ferrous sulfate 325 (65 FE) MG tablet Take 325 mg by mouth 3 (three) times daily with meals.    . furosemide (LASIX) 40 MG tablet Take 40 mg by mouth daily.    . imiquimod (ALDARA) 5 % cream Apply 1 application topically at bedtime.    .  Ipratropium-Albuterol (COMBIVENT RESPIMAT) 20-100 MCG/ACT AERS respimat Inhale 1 puff into the lungs every 6 (six) hours as needed for wheezing or shortness of breath.    . nystatin (MYCOSTATIN/NYSTOP) powder Apply 1 g topically 3 (three) times daily as needed (for itching/irritation).     Marland Kitchen tiZANidine (ZANAFLEX) 4 MG tablet Take 4 mg by mouth 2 (two) times daily as needed for muscle spasms.     . traMADol (ULTRAM) 50 MG tablet Take 50 mg by mouth 2 (two) times daily.      No current facility-administered medications for this visit.     Allergies:   Patient has no known allergies.   Social History:  The patient  reports that he has been smoking.  He has been smoking about 0.25 packs per day. He has never used smokeless tobacco. He reports that he does not drink alcohol.   Family History:  The patient's Family history is unknown by patient.    ROS:  Please see the history of present illness.   Otherwise, review of systems is positive for none.   All other systems are reviewed and negative.     PHYSICAL EXAM: VS:  BP 102/66   Pulse (!) 53   Ht 5\' 11"  (1.803 m)   Wt 174 lb (78.9 kg)   BMI 24.27 kg/m  , BMI Body mass index is 24.27 kg/m. GEN: Well nourished, well developed, in no acute distress  HEENT: normal  Neck: no JVD, carotid bruits, or masses Cardiac: RRR; no murmurs, rubs, or gallops,no edema  Respiratory:  clear to auscultation bilaterally, normal work of breathing GI: soft, nontender, nondistended, + BS MS: no deformity or atrophy  Skin: warm and dry Neuro:  Strength and sensation are intact Psych: euthymic mood, full affect  EKG:  EKG is ordered today. Personal review of the ekg ordered shows sinus rhythm, rate 53, multifocal PVCs  Recent Labs: 06/14/2016: Magnesium 1.9 11/16/2016: B Natriuretic Peptide 491.1 11/17/2016: ALT 17 11/19/2016: BUN 44; Creatinine, Ser 2.42; Potassium 4.2; Sodium 136 11/20/2016: Hemoglobin 7.0; Platelets 244    Lipid Panel  No results found  for: CHOL, TRIG, HDL, CHOLHDL, VLDL, LDLCALC, LDLDIRECT   Wt Readings from Last 3 Encounters:  01/31/17 174 lb (78.9 kg)  01/26/17 176 lb 3.2 oz (79.9 kg)  11/16/16 175 lb 4.3 oz (79.5 kg)      Other studies Reviewed: Additional studies/ records that were reviewed today include: TTE 08/17/15, Holter 08/03/16, Myoview 08/03/16  Review of the above records today demonstrates:  - Left ventricle: The cavity size was mildly dilated. Systolic   function was severely reduced. The estimated ejection fraction   was in the range of 20% to 25%. Severe diffuse hypokinesis with   distinct regional wall motion abnormalities. Akinesis and   scarring of the inferolateral, inferior, and inferoseptal   myocardium; consistent with infarction in the distribution of the   right coronary artery. Features are consistent  with a   pseudonormal left ventricular filling pattern, with concomitant   abnormal relaxation and increased filling pressure (grade 2   diastolic dysfunction). - Mitral valve: There was mild regurgitation. - Left atrium: The atrium was moderately to severely dilated. - Right ventricle: Systolic function was mildly reduced. - Right atrium: The atrium was mildly dilated. - Atrial septum: The septum bowed from left to right, consistent   with increased left atrial pressure. No defect or patent foramen   ovale was identified. - Pulmonary arteries: PA peak pressure: 31 mm Hg (S).   Normal sinus rhythm  Sinus brady with lowest rate 48 bpm  Very frequent multiform PVCs with bigeminy, trigeminy and runs of NSVT up to 26 beats.  Occasional PACs   There was no ST segment deviation noted during stress.  This is a high risk study.  The left ventricular ejection fraction is severely decreased (<30%).   LV is severely dilated. There are very frequent multifocal PVCs during the stress test. No evidence for infarct or ischemia. The findings are consistent with dilated non-ischemic  cardiomyopathy.    Holter 10/10/16 - personally reviewed Minimum HR: 40 BPM at 3:09:46 PM(2) Maximum HR: 112 BPM at 11:17:41 PM(2) Average HR: 71 BPM 3.6% PVC burden PVC runs of 2-3 beats No other arrhythmias noted  ASSESSMENT AND PLAN:  1.  Dilated cardiomyopathy: Currently on Coreg. Jahid Weida not tolerate Ace inhibitors or ARB's due to kidney dysfunction. Blood pressure is low and thus Shawnae Leiva likely not tolerate any further afterload reduction.  2. Chronic systolic HF: Class II symptoms. Does not tolerate any further medications aside from carvedilol due to blood pressure issues. Continue current management.  3. PVCs: Was 37% prior to amiodarone and 3.7% since amiodarone was started. Plan for repeat echocardiogram next week to determine if he would potentially benefit from an ICD.   Current medicines are reviewed at length with the patient today.   The patient does not have concerns regarding his medicines.  The following changes were made today:  none  Labs/ tests ordered today include:  Orders Placed This Encounter  Procedures  . TSH  . Hepatic function panel  . EKG 12-Lead     Disposition:   FU with Faye Sanfilippo 6 months  Signed, Loretha Ure Meredith Leeds, MD  01/31/2017 11:46 AM     [facility] [address] [place] [address] [phone] (office) [phone] (fax)

## 2017-01-31 NOTE — Patient Instructions (Signed)
Medication Instructions:    Your physician recommends that you continue on your current medications as directed. Please refer to the Current Medication list given to you today.  - If you need a refill on your cardiac medications before your next appointment, please call your pharmacy.   Labwork:  Medication surveillance labs today: TSH & LFT  Testing/Procedures:  None ordered  Follow-Up:  Your physician wants you to follow-up in: 6 months with Dr. Curt Bears.  You will receive a reminder letter in the mail two months in advance. If you don't receive a letter, please call our office to schedule the follow-up appointment.  Thank you for choosing [facility]!!   Trinidad Curet, RN [phone]

## 2017-02-01 LAB — HEPATIC FUNCTION PANEL
ALBUMIN: 3.8 g/dL (ref 3.5–4.8)
ALT: 38 IU/L (ref 0–44)
AST: 46 IU/L — ABNORMAL HIGH (ref 0–40)
Alkaline Phosphatase: 277 IU/L — ABNORMAL HIGH (ref 39–117)
BILIRUBIN TOTAL: 0.6 mg/dL (ref 0.0–1.2)
Bilirubin, Direct: 0.24 mg/dL (ref 0.00–0.40)
Total Protein: 8.5 g/dL (ref 6.0–8.5)

## 2017-02-01 LAB — TSH: TSH: 0.636 u[IU]/mL (ref 0.450–4.500)

## 2017-02-02 ENCOUNTER — Other Ambulatory Visit: Payer: Self-pay

## 2017-02-02 DIAGNOSIS — I420 Dilated cardiomyopathy: Secondary | ICD-10-CM

## 2017-02-03 ENCOUNTER — Ambulatory Visit (HOSPITAL_COMMUNITY)
Admission: RE | Admit: 2017-02-03 | Discharge: 2017-02-03 | Disposition: A | Discharge status: Home / Self Care | Payer: Medicare Other | Source: Ambulatory Visit | Attending: Urology | Admitting: Urology

## 2017-02-03 ENCOUNTER — Ambulatory Visit (HOSPITAL_COMMUNITY): Payer: Medicare Other

## 2017-02-03 DIAGNOSIS — D49512 Neoplasm of unspecified behavior of left kidney: Secondary | ICD-10-CM

## 2017-02-03 DIAGNOSIS — N433 Hydrocele, unspecified: Secondary | ICD-10-CM | POA: Diagnosis not present

## 2017-02-03 DIAGNOSIS — C4492 Squamous cell carcinoma of skin, unspecified: Secondary | ICD-10-CM

## 2017-02-03 DIAGNOSIS — N289 Disorder of kidney and ureter, unspecified: Secondary | ICD-10-CM | POA: Insufficient documentation

## 2017-02-03 DIAGNOSIS — D49511 Neoplasm of unspecified behavior of right kidney: Secondary | ICD-10-CM

## 2017-02-03 DIAGNOSIS — N281 Cyst of kidney, acquired: Secondary | ICD-10-CM | POA: Insufficient documentation

## 2017-02-03 MED ORDER — GADOBENATE DIMEGLUMINE 529 MG/ML IV SOLN
10.0000 mL | Freq: Once | INTRAVENOUS | Status: AC | PRN
  Administered 2017-02-03: 10 mL via INTRAVENOUS

## 2017-02-08 ENCOUNTER — Ambulatory Visit (HOSPITAL_COMMUNITY): Payer: Medicare Other | Attending: Cardiovascular Disease

## 2017-02-08 ENCOUNTER — Other Ambulatory Visit: Payer: Self-pay

## 2017-02-08 DIAGNOSIS — I493 Ventricular premature depolarization: Secondary | ICD-10-CM | POA: Diagnosis not present

## 2017-02-08 DIAGNOSIS — I472 Ventricular tachycardia: Secondary | ICD-10-CM | POA: Diagnosis not present

## 2017-02-08 DIAGNOSIS — I5022 Chronic systolic (congestive) heart failure: Secondary | ICD-10-CM | POA: Diagnosis not present

## 2017-02-08 DIAGNOSIS — N183 Chronic kidney disease, stage 3 unspecified: Secondary | ICD-10-CM

## 2017-02-08 DIAGNOSIS — I420 Dilated cardiomyopathy: Secondary | ICD-10-CM

## 2017-02-08 DIAGNOSIS — I4729 Other ventricular tachycardia: Secondary | ICD-10-CM

## 2017-02-08 DIAGNOSIS — J449 Chronic obstructive pulmonary disease, unspecified: Secondary | ICD-10-CM | POA: Diagnosis not present

## 2017-02-08 DIAGNOSIS — I340 Nonrheumatic mitral (valve) insufficiency: Secondary | ICD-10-CM | POA: Insufficient documentation

## 2017-02-08 DIAGNOSIS — I5021 Acute systolic (congestive) heart failure: Secondary | ICD-10-CM | POA: Diagnosis present

## 2017-02-08 DIAGNOSIS — I517 Cardiomegaly: Secondary | ICD-10-CM | POA: Insufficient documentation

## 2017-05-10 ENCOUNTER — Other Ambulatory Visit: Payer: Medicare Other | Admitting: *Deleted

## 2017-05-10 DIAGNOSIS — I420 Dilated cardiomyopathy: Secondary | ICD-10-CM

## 2017-05-11 LAB — HEPATIC FUNCTION PANEL
ALBUMIN: 3.5 g/dL (ref 3.5–4.8)
ALK PHOS: 149 IU/L — AB (ref 39–117)
ALT: 15 IU/L (ref 0–44)
AST: 22 IU/L (ref 0–40)
BILIRUBIN, DIRECT: 0.18 mg/dL (ref 0.00–0.40)
Bilirubin Total: 0.4 mg/dL (ref 0.0–1.2)
Total Protein: 7.2 g/dL (ref 6.0–8.5)

## 2017-05-12 ENCOUNTER — Telehealth: Payer: Self-pay | Admitting: *Deleted

## 2017-05-12 DIAGNOSIS — E875 Hyperkalemia: Secondary | ICD-10-CM

## 2017-05-12 DIAGNOSIS — Z79899 Other long term (current) drug therapy: Secondary | ICD-10-CM

## 2017-05-12 NOTE — Telephone Encounter (Signed)
Reviewed results with patient who verbalized understanding. Lab appointment scheduled for 3 months.

## 2017-06-23 ENCOUNTER — Other Ambulatory Visit (HOSPITAL_COMMUNITY): Payer: Self-pay | Admitting: Urology

## 2017-06-23 DIAGNOSIS — D49512 Neoplasm of unspecified behavior of left kidney: Secondary | ICD-10-CM

## 2017-06-23 DIAGNOSIS — D49511 Neoplasm of unspecified behavior of right kidney: Secondary | ICD-10-CM

## 2017-06-23 DIAGNOSIS — C4492 Squamous cell carcinoma of skin, unspecified: Secondary | ICD-10-CM

## 2017-08-11 ENCOUNTER — Ambulatory Visit (HOSPITAL_COMMUNITY)
Admission: RE | Admit: 2017-08-11 | Discharge: 2017-08-11 | Disposition: A | Discharge status: Home / Self Care | Payer: Medicare Other | Source: Ambulatory Visit | Attending: Urology | Admitting: Urology

## 2017-08-11 ENCOUNTER — Ambulatory Visit (HOSPITAL_COMMUNITY): Payer: Medicare Other

## 2017-08-11 ENCOUNTER — Other Ambulatory Visit: Payer: Medicare Other | Admitting: *Deleted

## 2017-08-11 DIAGNOSIS — E875 Hyperkalemia: Secondary | ICD-10-CM

## 2017-08-11 DIAGNOSIS — Z79899 Other long term (current) drug therapy: Secondary | ICD-10-CM

## 2017-08-11 LAB — HEPATIC FUNCTION PANEL
ALT: 14 IU/L (ref 0–44)
AST: 20 IU/L (ref 0–40)
Albumin: 3.7 g/dL (ref 3.5–4.8)
Alkaline Phosphatase: 132 IU/L — ABNORMAL HIGH (ref 39–117)
BILIRUBIN TOTAL: 0.8 mg/dL (ref 0.0–1.2)
BILIRUBIN, DIRECT: 0.25 mg/dL (ref 0.00–0.40)
TOTAL PROTEIN: 7.4 g/dL (ref 6.0–8.5)

## 2017-08-16 ENCOUNTER — Ambulatory Visit (HOSPITAL_COMMUNITY)
Admission: RE | Admit: 2017-08-16 | Discharge: 2017-08-16 | Disposition: A | Discharge status: Home / Self Care | Payer: Medicare Other | Source: Ambulatory Visit | Attending: Urology | Admitting: Urology

## 2017-08-16 DIAGNOSIS — C4492 Squamous cell carcinoma of skin, unspecified: Secondary | ICD-10-CM

## 2017-08-16 DIAGNOSIS — D49512 Neoplasm of unspecified behavior of left kidney: Secondary | ICD-10-CM | POA: Insufficient documentation

## 2017-08-16 DIAGNOSIS — D49511 Neoplasm of unspecified behavior of right kidney: Secondary | ICD-10-CM | POA: Diagnosis present

## 2017-08-16 DIAGNOSIS — N281 Cyst of kidney, acquired: Secondary | ICD-10-CM | POA: Insufficient documentation

## 2017-08-16 DIAGNOSIS — N433 Hydrocele, unspecified: Secondary | ICD-10-CM | POA: Diagnosis not present

## 2017-08-16 LAB — POCT I-STAT CREATININE: Creatinine, Ser: 1.9 mg/dL — ABNORMAL HIGH (ref 0.61–1.24)

## 2017-08-16 MED ORDER — GADOBENATE DIMEGLUMINE 529 MG/ML IV SOLN
20.0000 mL | Freq: Once | INTRAVENOUS | Status: AC | PRN
  Administered 2017-08-16: 10 mL via INTRAVENOUS

## 2017-11-17 IMAGING — DX DG CHEST 1V PORT
1 series · 1 of 1 positions shown · non-contrast
Comparison: 03/21/2005.

CLINICAL DATA: 69-year-old male with shortness breath and weakness
for 2 days. Initial encounter.

EXAM:
PORTABLE CHEST 1 VIEW

[chest ap]
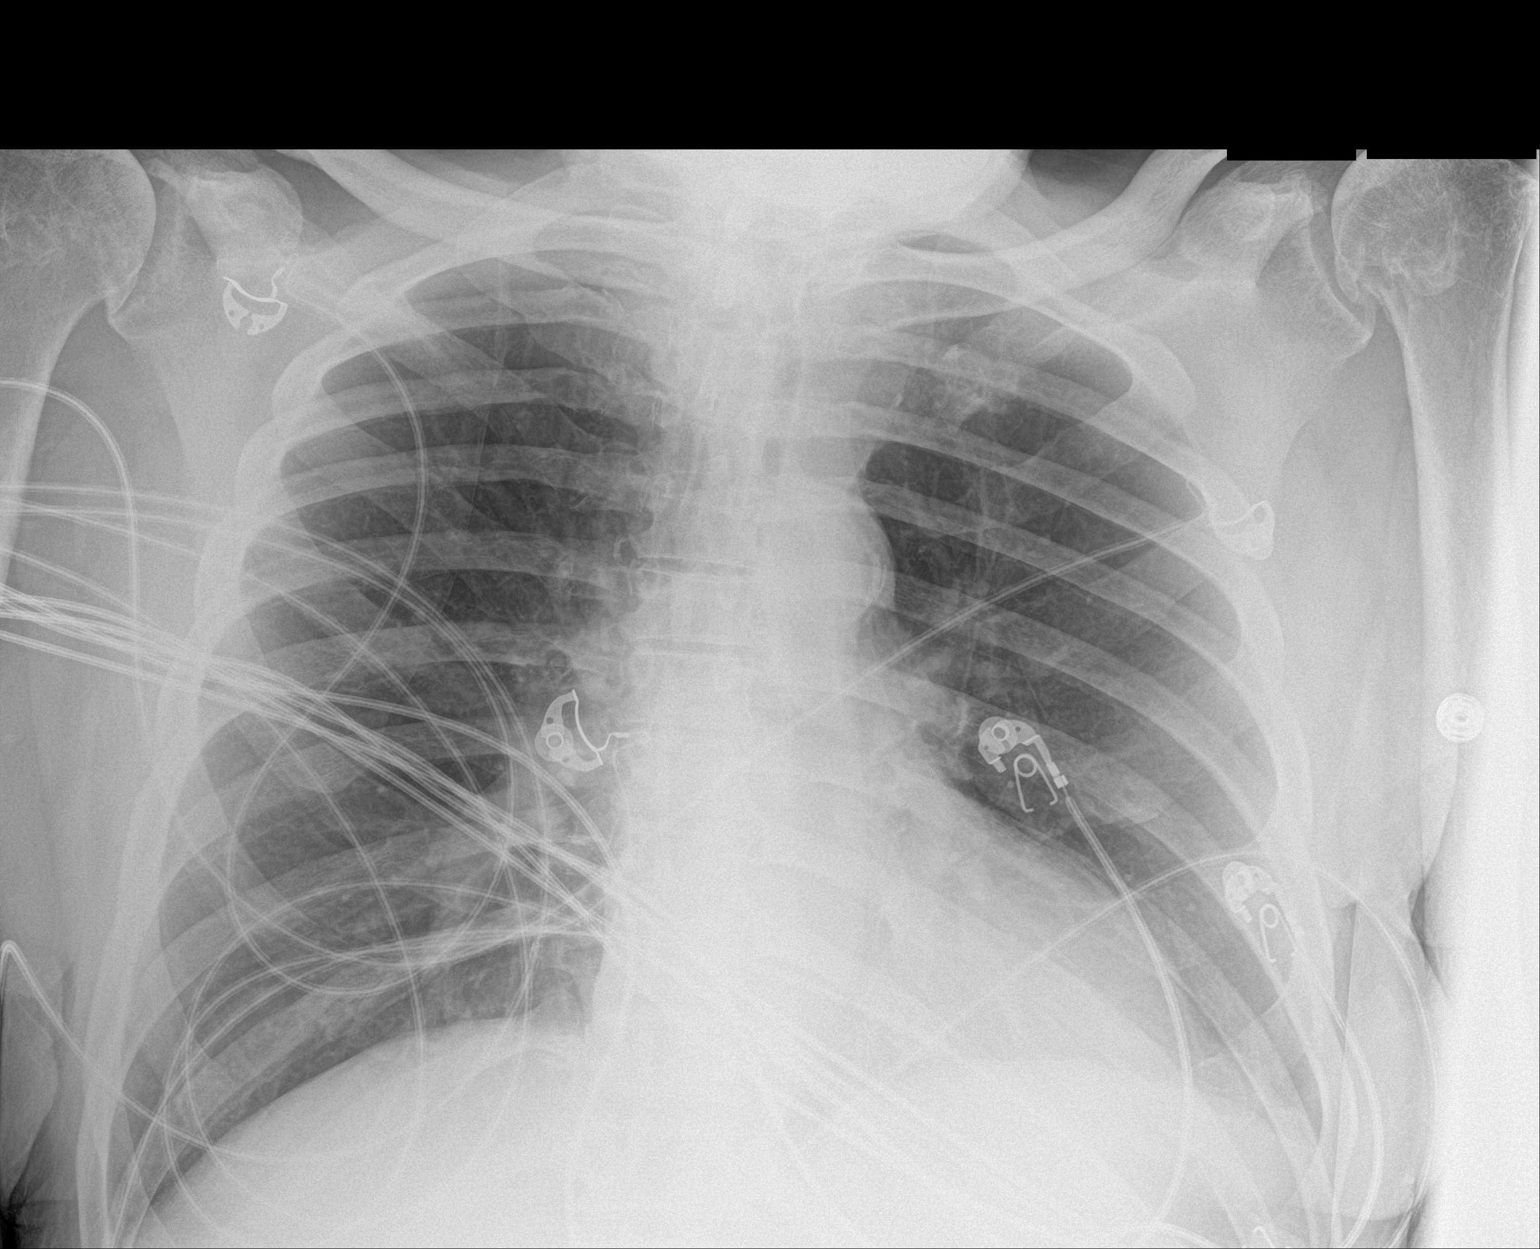

[1 of 1 positions shown; findings below may reference images not displayed]

FINDINGS: Portable exam with multiple overlying leads.

No infiltrate, congestive heart failure or pneumothorax.

No plain film evidence of pulmonary malignancy.

Heart size within normal limits.

Calcified slightly tortuous aorta.

Bilateral shoulder joint degenerative changes.
IMPRESSION: No acute abnormality.

Aortic atherosclerosis.

## 2017-11-17 IMAGING — CT CT ABD-PELV W/O CM
2 of 4 series · 14 of 46 positions shown, 16 images · non-contrast
Comparison: 02/11/2008

CLINICAL DATA: Abdominal pain. Scrotal swelling. Evaluate for
scrotal mass.

EXAM:
CT ABDOMEN AND PELVIS WITHOUT CONTRAST
TECHNIQUE: Multidetector CT imaging of the abdomen and pelvis was performed
following the standard protocol without IV contrast.

[Series 2: a/p w/o 5mm · axial · non-contrast · 0.81mm/px · z∈[+792,+1277]mm · 11 of 117 slices shown, 13 images]
[im 10/117  soft-tissue]
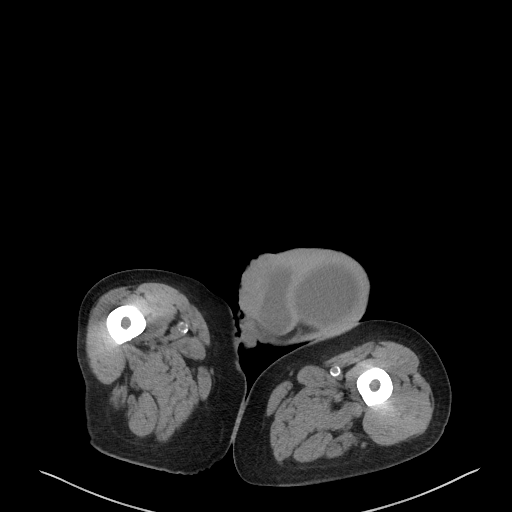
[im 10/117  bone]
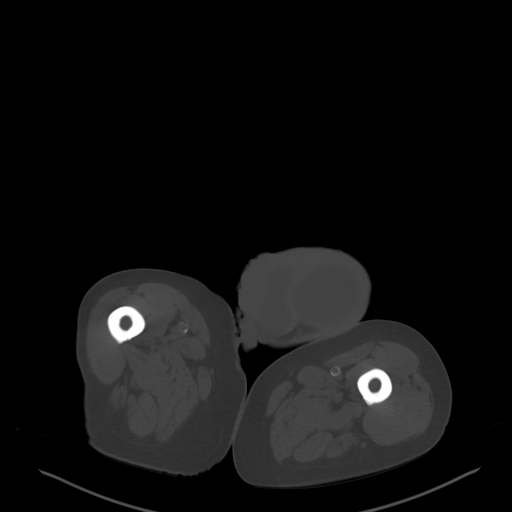
[im 20/117  soft-tissue]
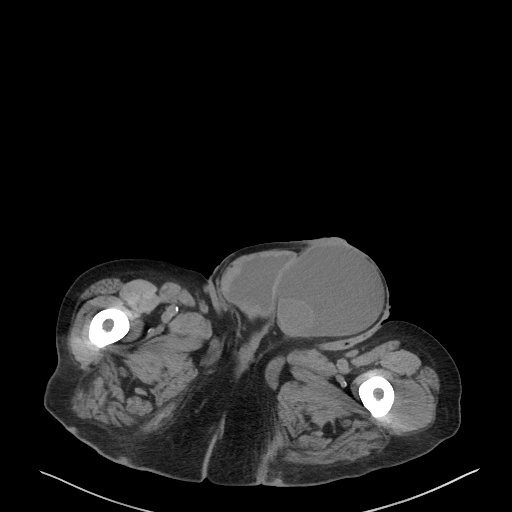
[im 30/117  soft-tissue]
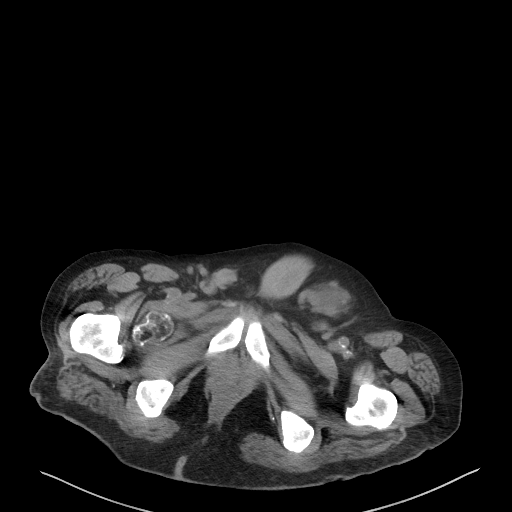
[im 39/117  soft-tissue]
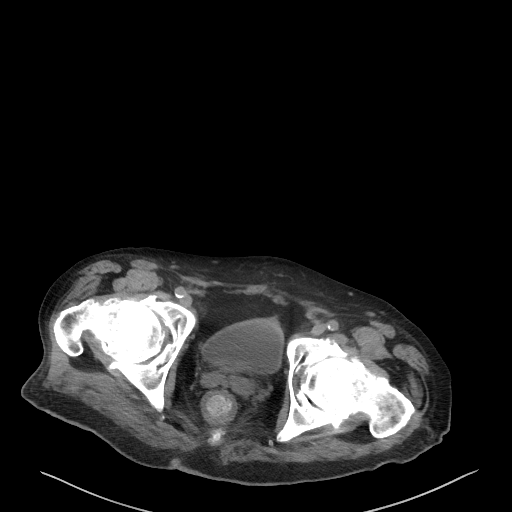
[im 49/117  soft-tissue]
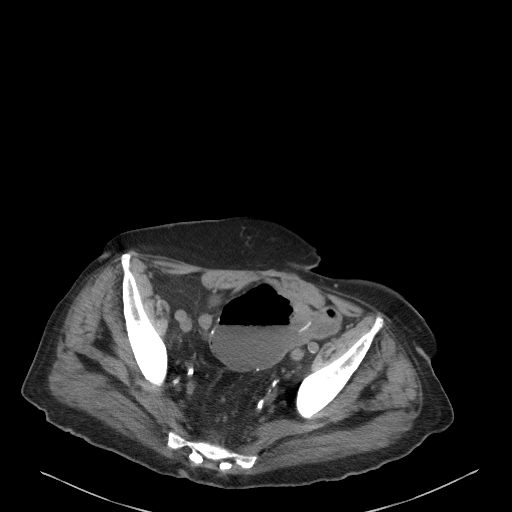
[im 59/117  soft-tissue]
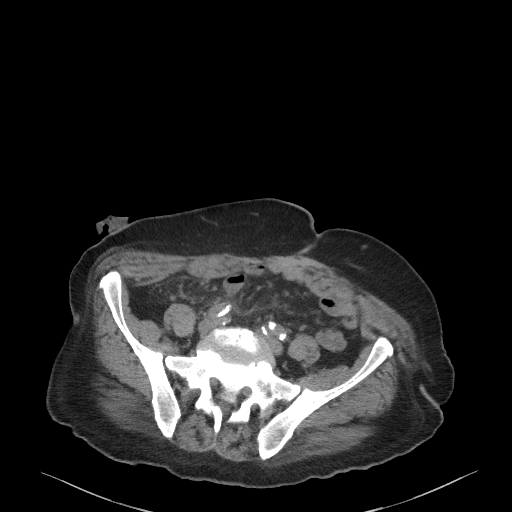
[im 68/117  soft-tissue]
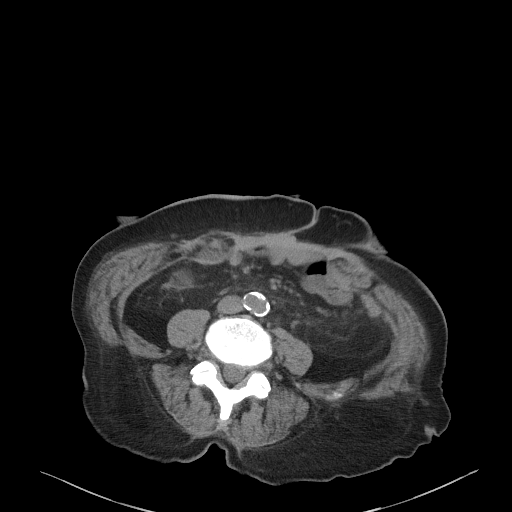
[im 78/117  soft-tissue]
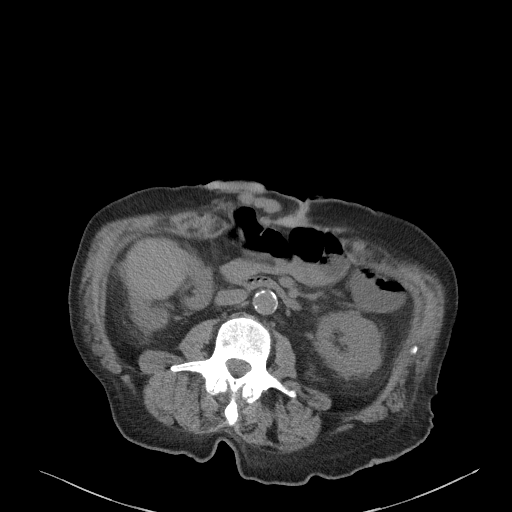
[im 88/117  soft-tissue]
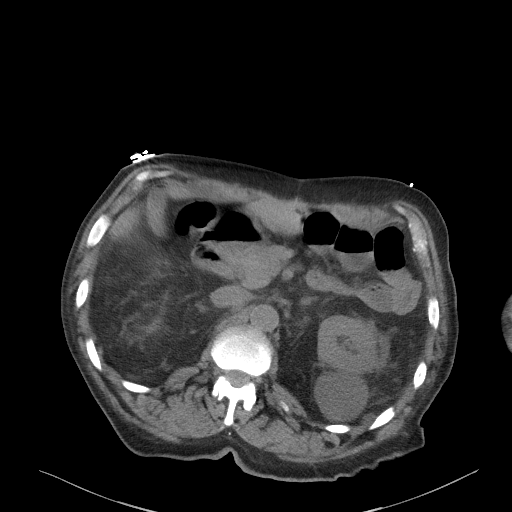
[im 88/117  bone]
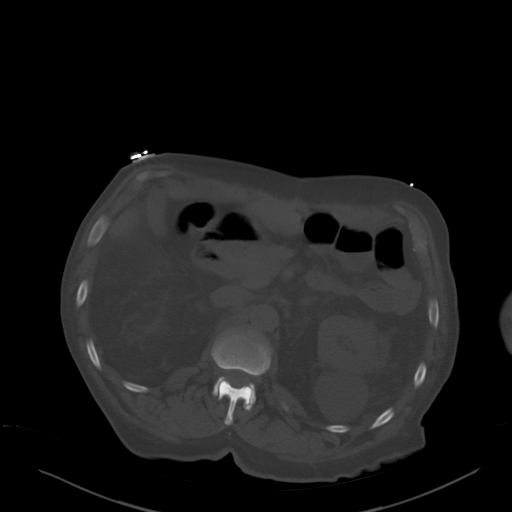
[im 97/117  soft-tissue]
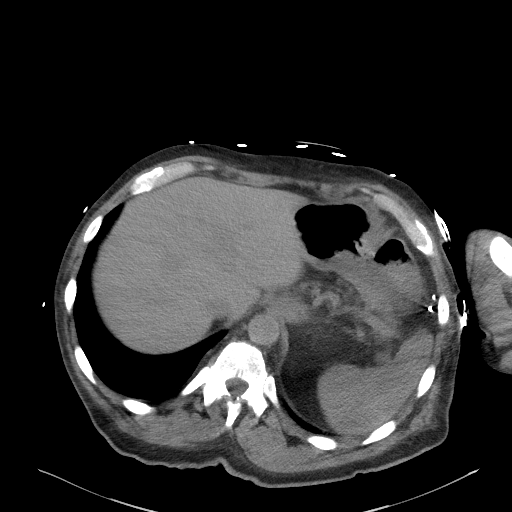
[im 107/117  soft-tissue]
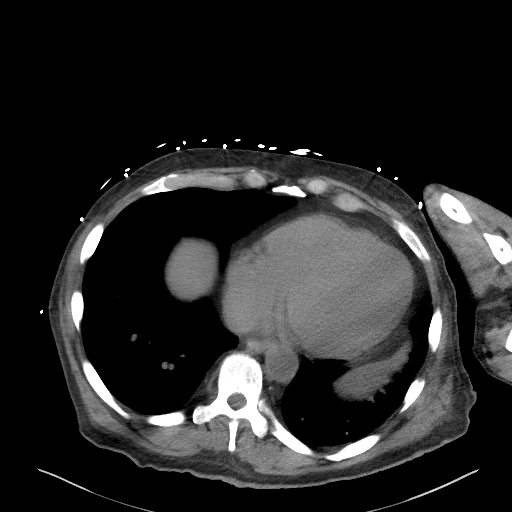

[Series 5: a/p w/o cor · coronal · non-contrast · 0.72mm/px · 3 of 142 slices shown]
[im 48/142  soft-tissue]
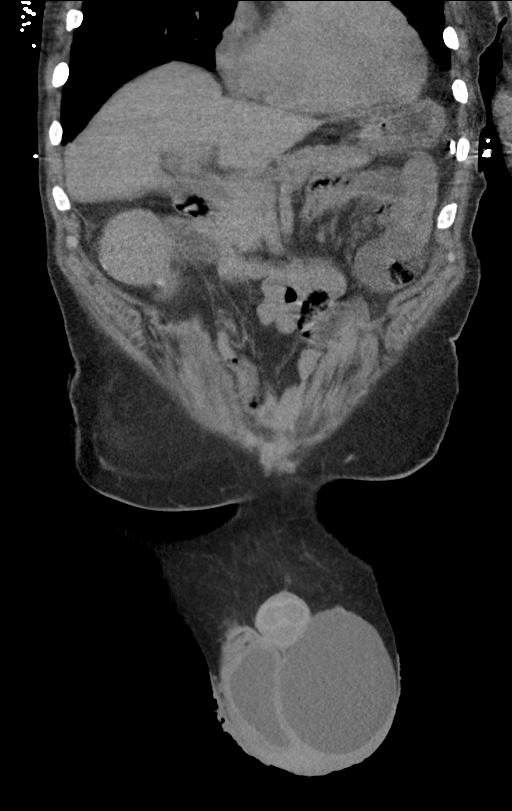
[im 63/142  soft-tissue]
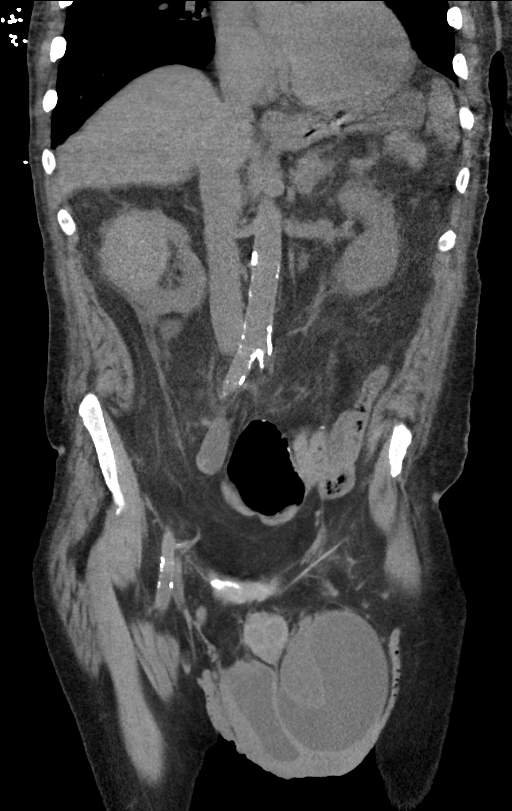
[im 79/142  soft-tissue]
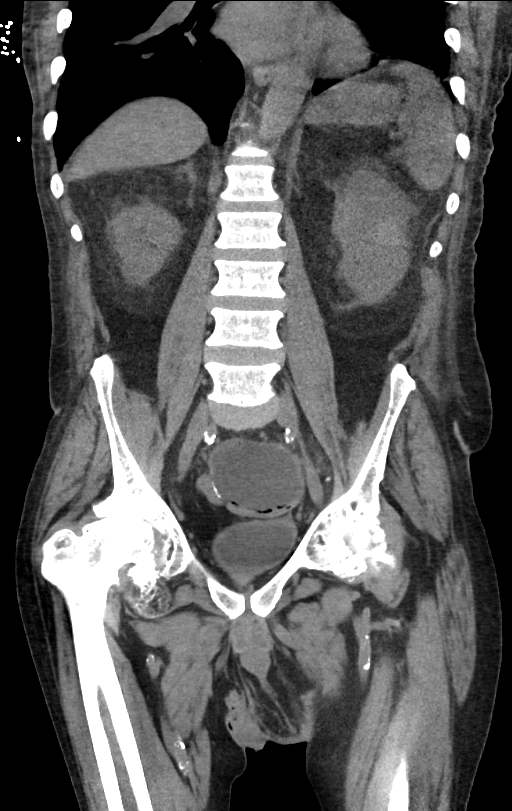

[14 of 46 positions shown; findings below may reference images not displayed]

FINDINGS: Lower chest: Lung bases are clear. No effusions. Heart is normal
size.

-

Hepatobiliary: No focal hepatic abnormality. Gallbladder
unremarkable.

Pancreas: No focal abnormality or ductal dilatation.

Spleen: No focal abnormality.  Normal size.

Adrenals/Urinary Tract: Multiple hyperdense and hypodense lesions
within the kidneys bilaterally, likely a combination of simple and
complex/ hemorrhagic cysts. The largest is in the right no pole
measuring 5.6 cm. This previously measured 3.3 cm. Cannot completely
exclude that some of these could represent solid renal lesions. No
hydronephrosis. 2.1 cm left adrenal nodule compared to 1.8 cm
previously. Right adrenal gland and urinary bladder grossly
unremarkable.

Stomach/Bowel: Postoperative changes noted within the colon. Right
lower quadrant ostomy present. There is Gissel is an layering fluid
collection noted in the pelvis with surrounding suture lines. This
presumably represents a dilated denervation segment of distal bowel,
but is difficult to exclude separate pelvic abscess. This measures
up to 7.6 cm. Small bowel and stomach decompressed.

Vascular/Lymphatic: Diffuse iliac and aortic calcifications. No
aneurysm. No adenopathy.

Reproductive: Very large bilateral hydroceles, left larger than
right.

Other: No free fluid or free air.

Musculoskeletal: No acute bony abnormality or focal bone lesion.
IMPRESSION: Changes of prior bowel surgery with right lower quadrant ostomy.
There is a gas and fluid collection in the pelvis measuring 7.6 cm
with surrounding suture. This is intimately associated with the
sigmoid colon remnant and may represent a segment of these are
patent dilated bowel, but I cannot completely exclude that this is
an abscess adjacent to the Hartmann's pouch.

Mixture of low-density and high-density lesions within the kidneys
bilaterally, enlarging since prior study. The largest cyst in the
midpole measuring 5.6 cm compared to 3.3 cm previously. While these
may reflect a combination of simple and hemorrhagic/complex cysts, I
cannot exclude solid renal lesions/ neoplasms. Recommend further
evaluation with contrast-enhanced CT or MRI.

Left adrenal nodule, slightly larger than prior study, nonspecific.

Aortoiliac atherosclerosis.

Large bilateral hydroceles, left greater than right.

## 2017-11-18 IMAGING — CR DG CHEST 1V PORT
1 series · 1 of 1 positions shown · non-contrast
Comparison: 06/07/2016

CLINICAL DATA: Endotracheal tube placement.

EXAM:
PORTABLE CHEST 1 VIEW

[AP]
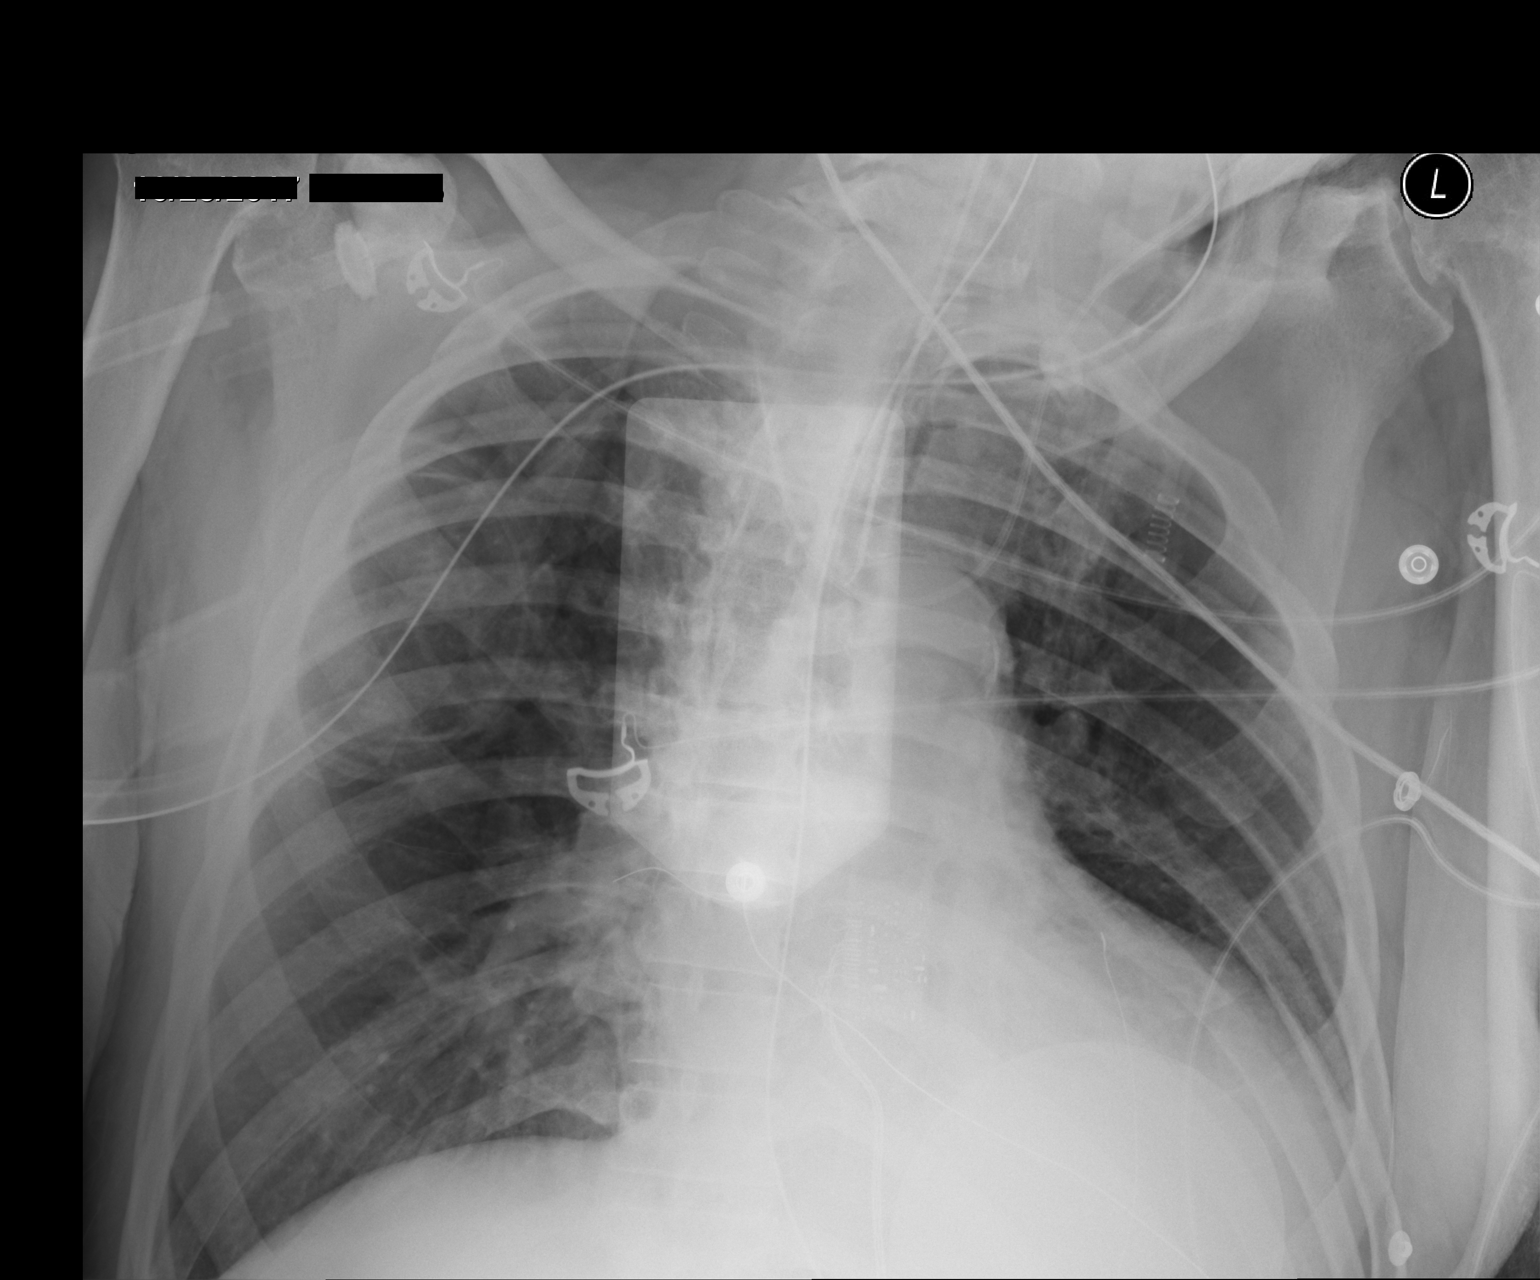

[1 of 1 positions shown; findings below may reference images not displayed]

FINDINGS: New endotracheal tube tip projects 2.9 cm above the Carina.

Nasal/orogastric tube passes to the level of the medial left
hemidiaphragm, below the included field of view, likely in the
stomach.

Left internal jugular central venous line has its tip extending
towards the right lung apex, consistent with it extending upward
into the right brachiocephalic vein.

No pneumothorax.  Lungs are clear.
IMPRESSION: 1. Left internal jugular central venous line catheter is
malpositioned positioned, with its tip projecting within the right
brachiocephalic vein are central right internal jugular vein.
2. Well-positioned endotracheal tube.
3. Nasal/orogastric tube passes below the included field of view
likely within the stomach.
4. Lungs remain clear.
5. No pneumothorax.

## 2017-11-18 IMAGING — DX DG CHEST 1V PORT
1 series · 1 of 1 positions shown · non-contrast
Comparison: 06/08/2016

CLINICAL DATA: Central line placement

EXAM:
PORTABLE CHEST 1 VIEW

[chest ap]
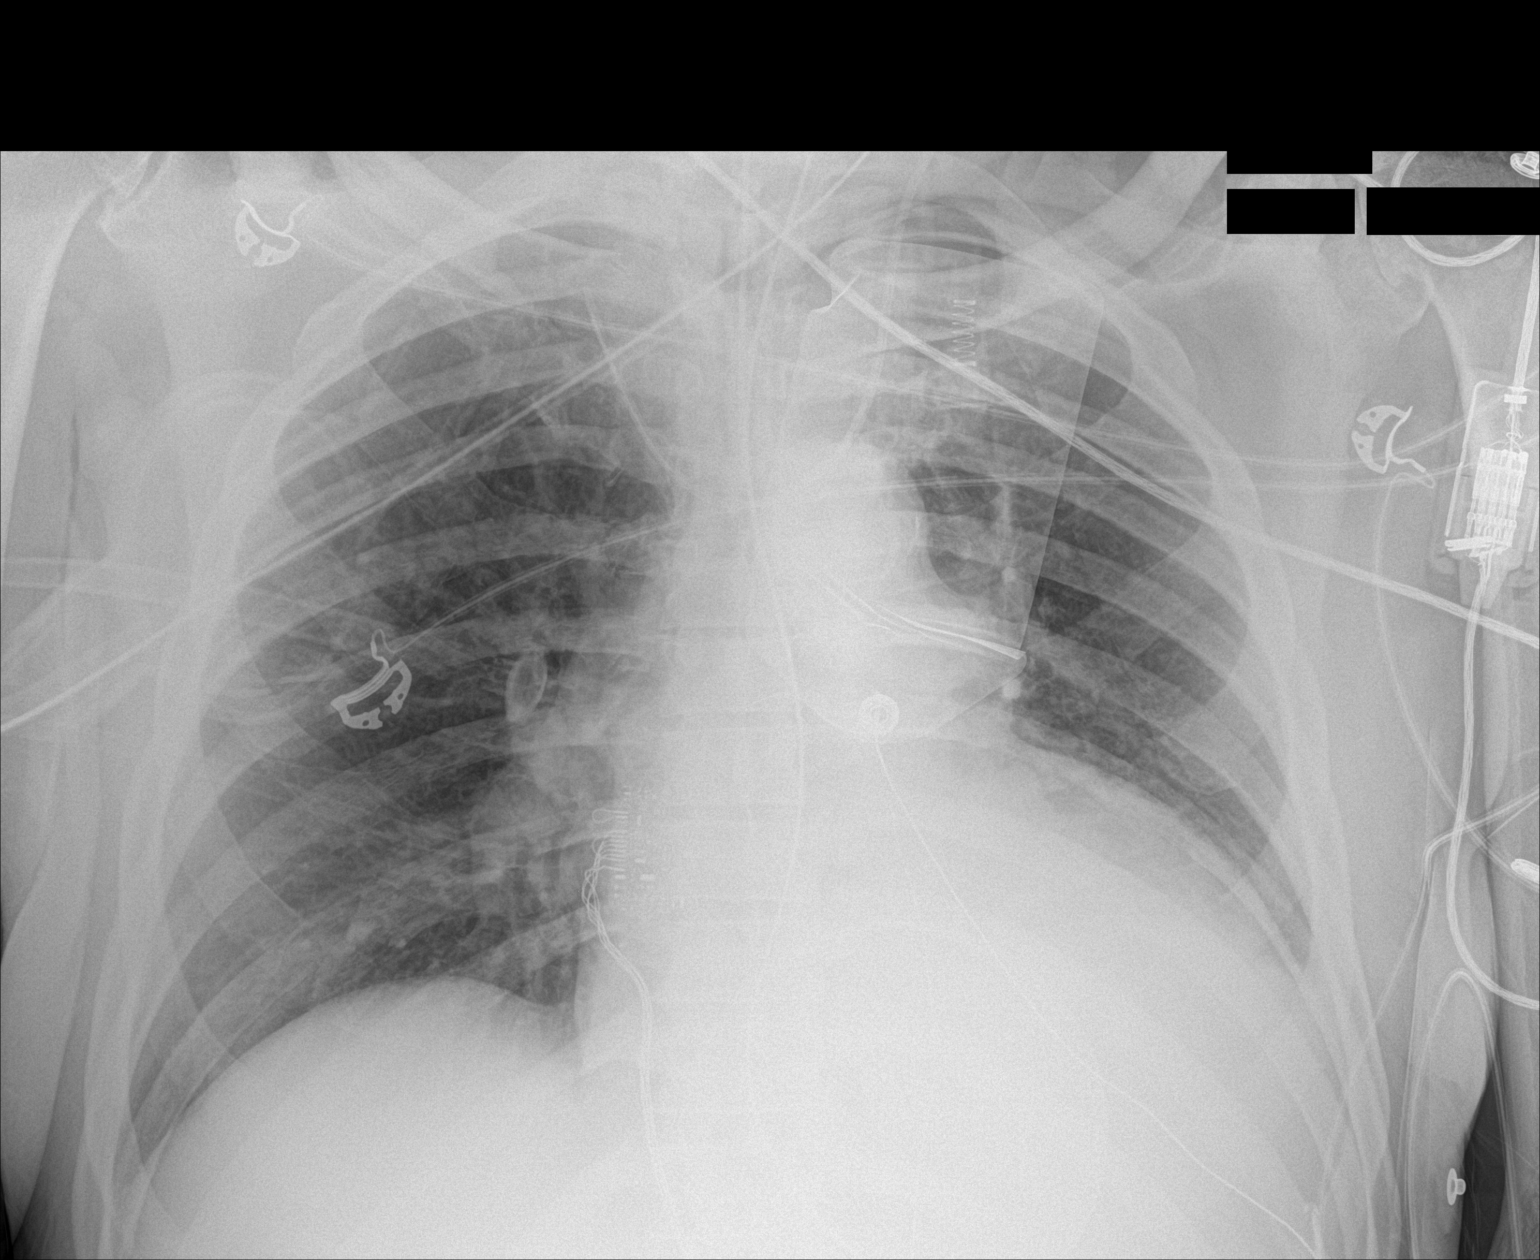

[1 of 1 positions shown; findings below may reference images not displayed]

FINDINGS: Stable endotracheal and NG tube position. Again noted left IJ
central line going cephalad in right brachiocephalic vein. No
pneumothorax.
IMPRESSION: Again noted left IJ central line with tip going cephalad in right
brachiocephalic vein. No pneumothorax.

These results were called by telephone at the time of interpretation
on 06/08/2016 at [DATE] to patient's ICU nurse Clavijo, who verbally
acknowledged these results.

## 2017-11-19 IMAGING — CR DG CHEST 1V PORT
1 series · 1 of 1 positions shown · non-contrast
Comparison: 1 a prior

CLINICAL DATA: History of endotracheal tube. Hypertension.
Arthritis. COPD.

EXAM:
PORTABLE CHEST 1 VIEW

[AP]
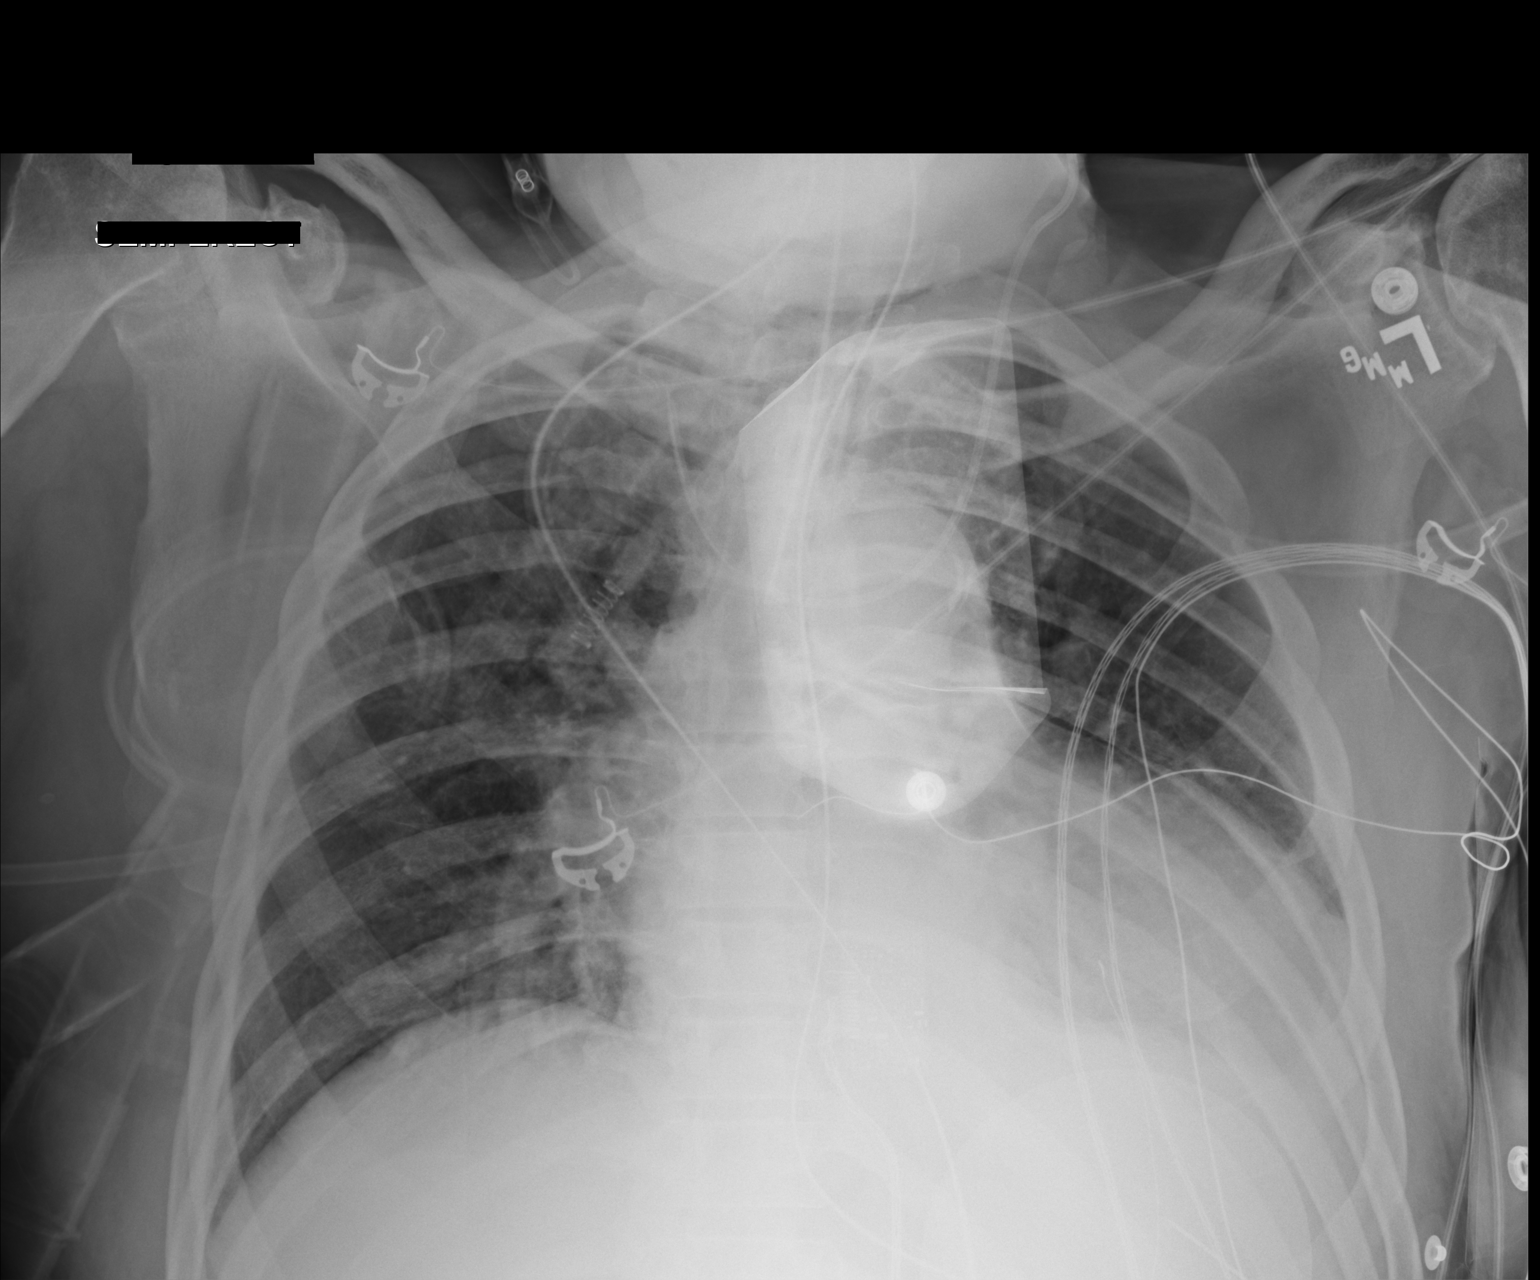

[1 of 1 positions shown; findings below may reference images not displayed]

FINDINGS: Left internal jugular line is again malpositioned, with tip directed
cephalad, with tip in the right brachiocephalic vein. This is
unchanged. Endotracheal tube terminates 4.1 cm above
carina.Nasogastric tube extends beyond the inferior aspect of the
film.

Numerous leads and wires project over the chest. Cardiomegaly
accentuated by AP portable technique. Probable small left pleural
effusion. No pneumothorax. Mild pulmonary venous congestion. Left
base airspace disease persists, improved.
IMPRESSION: Cardiomegaly with mild pulmonary venous congestion.

Probable small left pleural effusion with improved adjacent
atelectasis.

Malpositioned left internal jugular line, as detailed on prior
exams.

## 2017-11-20 IMAGING — DX DG CHEST 1V PORT
1 series · 1 of 1 positions shown · non-contrast
Comparison: Yesterday

CLINICAL DATA: Endotracheal tube

EXAM:
PORTABLE CHEST 1 VIEW

[chest ap]
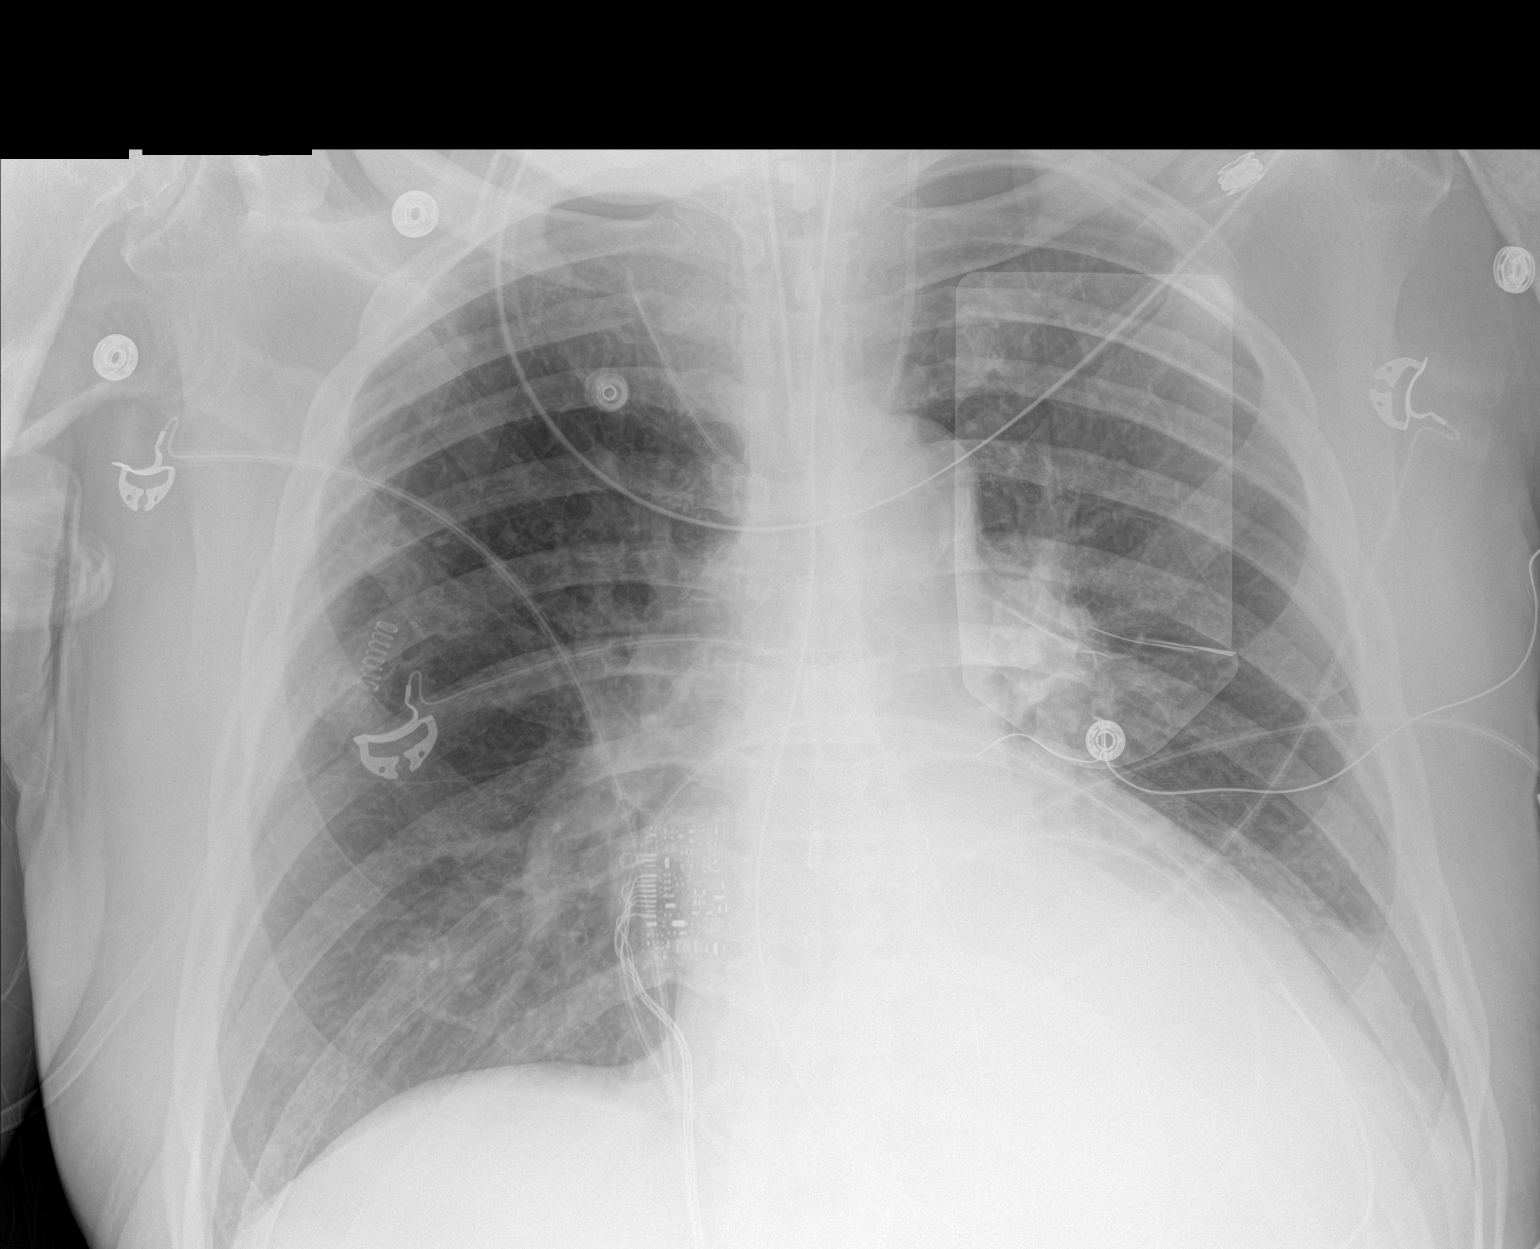

[1 of 1 positions shown; findings below may reference images not displayed]

FINDINGS: Endotracheal tube tip between the clavicular heads and carina. Left
IJ central line crosses into the right brachiocephalic vein,
unchanged. Nasogastric tube reaches the stomach.

Worsening left basilar aeration with obscured diaphragm. No
pneumothorax or edema.
IMPRESSION: 1. Unchanged positioning of tubes and left IJ line with tip at the
right brachiocephalic vein.
2. Interval worsening retrocardiac aeration, likely atelectasis.

## 2017-11-21 ENCOUNTER — Other Ambulatory Visit: Payer: Self-pay

## 2017-11-21 ENCOUNTER — Encounter (HOSPITAL_COMMUNITY): Payer: Self-pay | Admitting: Emergency Medicine

## 2017-11-21 ENCOUNTER — Emergency Department (HOSPITAL_COMMUNITY): Payer: Medicare Other

## 2017-11-21 ENCOUNTER — Inpatient Hospital Stay (HOSPITAL_COMMUNITY)
Admission: EM | Admit: 2017-11-21 | Discharge: 2017-11-24 | DRG: 683 | Disposition: A | Discharge status: Home / Self Care | Payer: Medicare Other | Attending: Internal Medicine | Admitting: Internal Medicine

## 2017-11-21 DIAGNOSIS — W19XXXA Unspecified fall, initial encounter: Secondary | ICD-10-CM | POA: Diagnosis present

## 2017-11-21 DIAGNOSIS — E875 Hyperkalemia: Secondary | ICD-10-CM | POA: Diagnosis present

## 2017-11-21 DIAGNOSIS — N5089 Other specified disorders of the male genital organs: Secondary | ICD-10-CM | POA: Diagnosis present

## 2017-11-21 DIAGNOSIS — R109 Unspecified abdominal pain: Secondary | ICD-10-CM | POA: Diagnosis not present

## 2017-11-21 DIAGNOSIS — S301XXA Contusion of abdominal wall, initial encounter: Secondary | ICD-10-CM | POA: Diagnosis present

## 2017-11-21 DIAGNOSIS — E872 Acidosis: Secondary | ICD-10-CM | POA: Diagnosis present

## 2017-11-21 DIAGNOSIS — Z7982 Long term (current) use of aspirin: Secondary | ICD-10-CM

## 2017-11-21 DIAGNOSIS — M109 Gout, unspecified: Secondary | ICD-10-CM | POA: Diagnosis present

## 2017-11-21 DIAGNOSIS — N179 Acute kidney failure, unspecified: Secondary | ICD-10-CM | POA: Diagnosis not present

## 2017-11-21 DIAGNOSIS — D696 Thrombocytopenia, unspecified: Secondary | ICD-10-CM | POA: Diagnosis present

## 2017-11-21 DIAGNOSIS — I472 Ventricular tachycardia: Secondary | ICD-10-CM | POA: Diagnosis present

## 2017-11-21 DIAGNOSIS — N281 Cyst of kidney, acquired: Secondary | ICD-10-CM | POA: Diagnosis present

## 2017-11-21 DIAGNOSIS — I5022 Chronic systolic (congestive) heart failure: Secondary | ICD-10-CM | POA: Diagnosis present

## 2017-11-21 DIAGNOSIS — J449 Chronic obstructive pulmonary disease, unspecified: Secondary | ICD-10-CM | POA: Diagnosis present

## 2017-11-21 DIAGNOSIS — Z79899 Other long term (current) drug therapy: Secondary | ICD-10-CM

## 2017-11-21 DIAGNOSIS — I420 Dilated cardiomyopathy: Secondary | ICD-10-CM | POA: Diagnosis present

## 2017-11-21 DIAGNOSIS — R0602 Shortness of breath: Secondary | ICD-10-CM

## 2017-11-21 DIAGNOSIS — I130 Hypertensive heart and chronic kidney disease with heart failure and stage 1 through stage 4 chronic kidney disease, or unspecified chronic kidney disease: Secondary | ICD-10-CM | POA: Diagnosis present

## 2017-11-21 DIAGNOSIS — R001 Bradycardia, unspecified: Secondary | ICD-10-CM | POA: Diagnosis present

## 2017-11-21 DIAGNOSIS — R630 Anorexia: Secondary | ICD-10-CM | POA: Diagnosis present

## 2017-11-21 DIAGNOSIS — R188 Other ascites: Secondary | ICD-10-CM | POA: Diagnosis present

## 2017-11-21 DIAGNOSIS — N184 Chronic kidney disease, stage 4 (severe): Secondary | ICD-10-CM | POA: Diagnosis present

## 2017-11-21 DIAGNOSIS — Z933 Colostomy status: Secondary | ICD-10-CM

## 2017-11-21 LAB — CBC WITH DIFFERENTIAL/PLATELET
BASOS ABS: 0 10*3/uL (ref 0.0–0.1)
Basophils Relative: 0 %
EOS PCT: 0 %
Eosinophils Absolute: 0 10*3/uL (ref 0.0–0.7)
HEMATOCRIT: 52.9 % — AB (ref 39.0–52.0)
Hemoglobin: 17.1 g/dL — ABNORMAL HIGH (ref 13.0–17.0)
LYMPHS ABS: 1.4 10*3/uL (ref 0.7–4.0)
Lymphocytes Relative: 19 %
MCH: 26.3 pg (ref 26.0–34.0)
MCHC: 32.3 g/dL (ref 30.0–36.0)
MCV: 81.4 fL (ref 78.0–100.0)
Monocytes Absolute: 0.2 10*3/uL (ref 0.1–1.0)
Monocytes Relative: 3 %
NEUTROS ABS: 5.8 10*3/uL (ref 1.7–7.7)
Neutrophils Relative %: 78 %
Platelets: 111 10*3/uL — ABNORMAL LOW (ref 150–400)
RBC: 6.5 MIL/uL — AB (ref 4.22–5.81)
RDW: 21.4 % — AB (ref 11.5–15.5)
WBC: 7.5 10*3/uL (ref 4.0–10.5)

## 2017-11-21 LAB — BASIC METABOLIC PANEL
Anion gap: 11 (ref 5–15)
BUN: 130 mg/dL — ABNORMAL HIGH (ref 6–20)
CO2: 8 mmol/L — ABNORMAL LOW (ref 22–32)
CREATININE: 6.17 mg/dL — AB (ref 0.61–1.24)
Calcium: 8.9 mg/dL (ref 8.9–10.3)
Chloride: 118 mmol/L — ABNORMAL HIGH (ref 101–111)
GFR calc Af Amer: 9 mL/min — ABNORMAL LOW (ref >60)
GFR, EST NON AFRICAN AMERICAN: 8 mL/min — AB (ref >60)
Glucose, Bld: 116 mg/dL — ABNORMAL HIGH (ref 65–99)
Potassium: 5.8 mmol/L — ABNORMAL HIGH (ref 3.5–5.1)
SODIUM: 137 mmol/L (ref 135–145)

## 2017-11-21 IMAGING — CR DG CHEST 1V PORT
1 series · 1 of 1 positions shown · non-contrast
Comparison: 06/10/2016

CLINICAL DATA: Endotracheal tube placement

EXAM:
PORTABLE CHEST 1 VIEW

[AP]
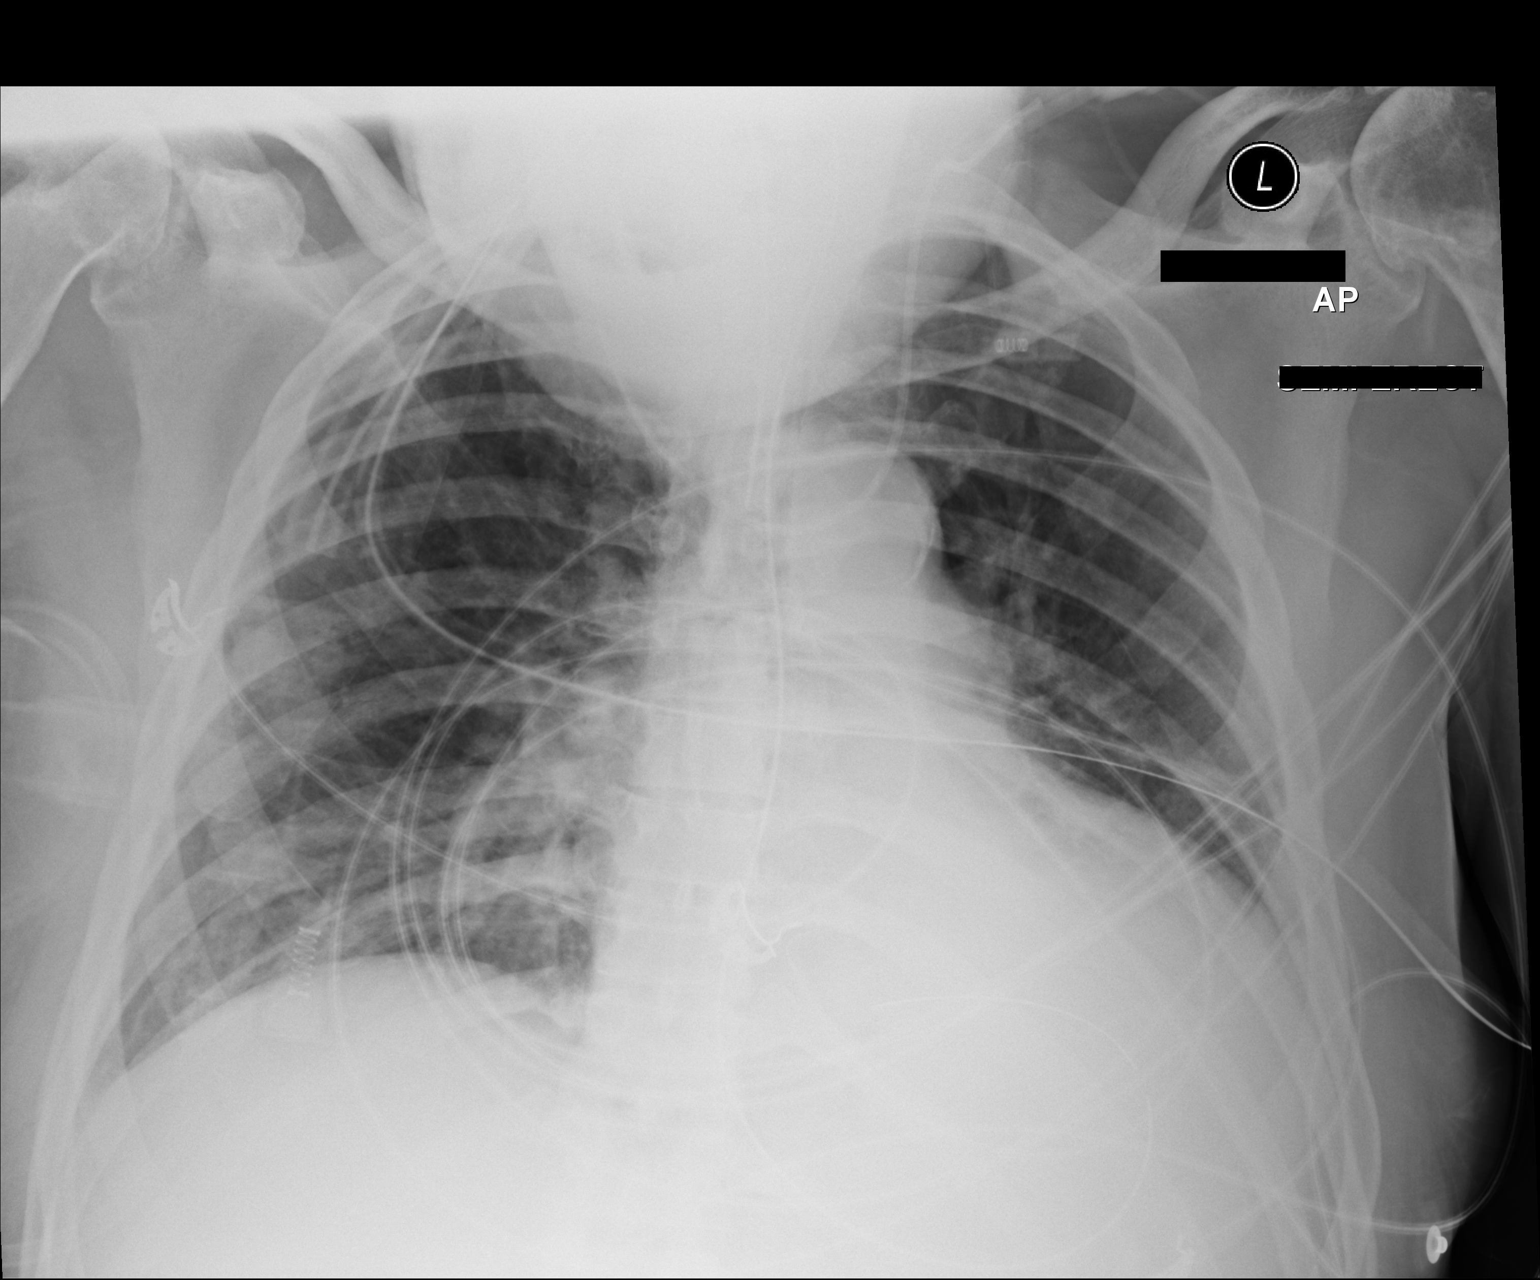

[1 of 1 positions shown; findings below may reference images not displayed]

FINDINGS: Endotracheal tube with the tip 2.7 cm above the carina. Nasogastric
tube coursing below the diaphragm projecting over the stomach. Left
IJ central venous catheter with the tip located at the confluence of
the right IJ - brachycephalic pain and directed cranially ;
recommend repositioning prior to use.

Mild bilateral interstitial prominence. No focal consolidation,
pleural effusion or pneumothorax. Stable cardiomegaly.

Moderate osteoarthritis of bilateral glenohumeral joints.
IMPRESSION: 1. Endotracheal tube with the tip 2.7 cm above the carina.
2. Left IJ central venous catheter with the tip located at the
confluence of the right IJ - brachycephalic pain and directed
cranially ; recommend repositioning prior to use.

## 2017-11-21 MED ORDER — ONDANSETRON HCL 4 MG/2ML IJ SOLN
4.0000 mg | Freq: Once | INTRAMUSCULAR | Status: AC
  Administered 2017-11-21: 4 mg via INTRAVENOUS
  Filled 2017-11-21: qty 2

## 2017-11-21 MED ORDER — SODIUM CHLORIDE 0.9 % IV BOLUS
1000.0000 mL | Freq: Once | INTRAVENOUS | Status: AC
  Administered 2017-11-21: 1000 mL via INTRAVENOUS

## 2017-11-21 MED ORDER — FENTANYL CITRATE (PF) 100 MCG/2ML IJ SOLN
50.0000 ug | Freq: Once | INTRAMUSCULAR | Status: AC
  Administered 2017-11-21: 50 ug via INTRAVENOUS
  Filled 2017-11-21: qty 2

## 2017-11-21 NOTE — ED Notes (Signed)
Bed: PJ09 Expected date:  Expected time:  Means of arrival:  Comments: EMS 73 yo male SOB x 2 days-fall 1 week ago with LUQ pain

## 2017-11-21 NOTE — ED Triage Notes (Signed)
Pt brought in by EMS from home with c/o "shortness of breath that comes and goes", onset a week ago.  Pt has no hx of any lung problem.  Pt also c/o LUQ abdominal pain without nausea and vomiting--- pt reported that he fell last week where he landed on his abdomen.  Pt has colostomy.  Pt's O2 sat was 97% on room air on EMS' arrival on scene.

## 2017-11-21 NOTE — ED Provider Notes (Signed)
[facility] DEPT Provider Note   CSN: [id number] Arrival date & time: 11/21/17  1943     History   Chief Complaint Chief Complaint  Patient presents with  . Shortness of Breath    HPI [name] is a 72 y.o. male who presents for abdominal pain.  He has a he has an extensive past medical history including bowel perforation with open laparotomy and currently has a colostomy.  Patient's daughter called EMS for him to come in today because she states he has been complaining of severe pain in his abdomen and near his rib cage, she seemed short of breath the patient states that that is because it hurts so much in his belly.  He fell last week and she thought maybe he broke some of his ribs.  He was seen by his primary care physician for this.  Patient states "the only thing that is bothering me is this knot."  Been present for over a week and has been worsening in pain.  It is now red and exquisitely tender to palpation.  He has had anorexia, nausea without vomiting.  No fevers or chills.  He has not had a cough.  HPI  Past Medical History:  Diagnosis Date  . Arthritis   . Chronic kidney disease, stage 3 ([place])   . Chronic systolic CHF (congestive heart failure) ([place]) 07/04/2016   a. Echo 10/17: Moderate concentric LVH, EF 20-25, diffuse HK, anteroseptal, inferior and inferoseptal akinesis, grade 2 diastolic dysfunction, severe LAE  //  b. Echo 08/16/16: EF 20-25, severe diff HK, inf-lat, inf and inf-septal AK and scarring c/w RCA distribution infarct, Gr 2 DD, mild MR, mod to severe LAE, mild RVE, mild RAE, PASP 31 (Nuc study neg for scar)  . Colon obstruction (HCC)    Sigmoid  . COPD (chronic obstructive pulmonary disease) ([place])   . Dilated cardiomyopathy ([place])    A. LHC 7/06: normal coronary arteries  //  b. Myoview 12/17: severely dilated LV, frequent multifocal PVCs, EF < 30, no infarct or ischemia (c/w dilated NICM), high risk  . Diverticulitis   .  Erectile dysfunction   . Fistula    chronic enterocutaneous  . Gout   . Hypertension   . Inguinal hernia   . NSVT (nonsustained ventricular tachycardia) (HCC)    A. admx in 11/94 with metabolic derangement in setting of septic shock - NSVT tx with Amio/Lidocaine; EF 20-25 on echo; Dc'd on beta-blocker (Coreg) only (no Amiodarone)  . Osteoarthritis   . Perforated bowel ([place])    perforation of the cecum  . Peritonitis ([place])   . Scrotal mass     Patient Active Problem List   Diagnosis Date Noted  . CKD (chronic kidney disease), stage IV ([place]) 11/16/2016  . COPD with chronic bronchitis ([place]) 11/16/2016  . Symptomatic anemia 11/16/2016  . Chronic systolic CHF (congestive heart failure) ([place]) 07/04/2016  . CKD (chronic kidney disease) stage 3, GFR 30-59 ml/min (HCC) 07/04/2016  . Abdominal fluid collection   . Abdominal pain   . Epigastric pain   . AKI (acute kidney injury) ([place])   . NSVT (nonsustained ventricular tachycardia) ([place])   . Dilated cardiomyopathy ([place])   . Septic shock ([place])   . Pressure injury of skin 06/08/2016  . Acute respiratory failure with hypoxia ([place])   . Endotracheally intubated   . Scrotal mass   . Sepsis ([place])   . Acute hyperkalemia 06/07/2016    Past Surgical History:  Procedure Laterality Date  . COLON SURGERY    . COLOSTOMY    . CONDYLOMA EXCISION/FULGURATION Right 11/19/2016   Procedure: CONDYLOMA REMOVAL, SCROTAL EXPLORATION, HYDROCELE ASPIRATION;  Surgeon: Irine Seal, MD;  Location: [facility];  Service: Urology;  Laterality: Right;  Nonischemic cardiomyopathy history with EF 20-25%        Home Medications    Prior to Admission medications   Medication Sig Start Date End Date Taking? Authorizing Provider  albuterol (PROVENTIL) (2.5 MG/3ML) 0.083% nebulizer solution Take 2.5 mg by nebulization every 6 (six) hours as needed for wheezing or shortness of breath.    [provider]  allopurinol (ZYLOPRIM) 100 MG tablet Take 100 mg by mouth  daily.    [provider]  amiodarone (PACERONE) 200 MG tablet Take 1 tablet (200 mg total) by mouth daily. 01/26/17   [place]ique, Peter M, MD  aspirin EC 81 MG tablet Take 81 mg by mouth daily.    [provider]  carvedilol (COREG) 6.25 MG tablet Take 1.5 tablets (9.375 mg total) by mouth 2 (two) times daily. 01/26/17   [place], Peter M, MD  diclofenac sodium (VOLTAREN) 1 % GEL Apply 2 g topically 4 (four) times daily as needed (for pain).     [provider]  ferrous sulfate 325 (65 FE) MG tablet Take 325 mg by mouth 3 (three) times daily with meals.    [provider]  furosemide (LASIX) 40 MG tablet Take 40 mg by mouth daily.    [provider]  imiquimod (ALDARA) 5 % cream Apply 1 application topically at bedtime.    [provider]  Ipratropium-Albuterol (COMBIVENT RESPIMAT) 20-100 MCG/ACT AERS respimat Inhale 1 puff into the lungs every 6 (six) hours as needed for wheezing or shortness of breath.    [provider]  nystatin (MYCOSTATIN/NYSTOP) powder Apply 1 g topically 3 (three) times daily as needed (for itching/irritation).     [provider]  sildenafil (REVATIO) 20 MG tablet take 3 to 5 tablets by mouth DAILY AS NEEDED 30 MINUTES BEFORE INTERCOURSE 10/16/17   [provider]  [facility] 160-4.5 MCG/ACT inhaler  10/17/17   [provider]  tiZANidine (ZANAFLEX) 4 MG tablet Take 4 mg by mouth 2 (two) times daily as needed for muscle spasms.     [provider]  traMADol (ULTRAM) 50 MG tablet Take 50 mg by mouth 2 (two) times daily.     [provider]    Family History Family History  Family history unknown: Yes    Social History Social History   Tobacco Use  . Smoking status: Current Some Day Smoker    Packs/day: 0.25  . Smokeless tobacco: Never Used  Substance Use Topics  . Alcohol use: No  . Drug use: Not on file     Allergies   Patient has no known allergies.   Review  of Systems Review of Systems  Ten systems reviewed and are negative for acute change, except as noted in the HPI.   Physical Exam Updated Vital Signs BP 127/64   Pulse (!) 50   Temp 97.8 F (36.6 C) (Oral)   Resp 18   Ht 5\' 11"  (1.803 m)   Wt 85.7 kg (189 lb)   SpO2 99%   BMI 26.36 kg/m   Physical Exam  Constitutional: No distress.  Patient appears chronically ill  HENT:  Head: Normocephalic and atraumatic.  Eyes: Conjunctivae are normal. No scleral icterus.  Neck: Normal range of motion.  Neck supple.  Cardiovascular: Normal rate, regular rhythm and normal heart sounds.  Pulmonary/Chest: Effort normal and breath sounds normal. No respiratory distress.  Abdominal: Soft. He exhibits mass. There is tenderness.  Patient with exquisitely tender well some circumscribed and firm abdominal mass measuring about 6 cm on the left side of the superior aspect of the patient's midline abdominal surgical scar from previous open laparotomy.    Musculoskeletal: He exhibits no edema.  Neurological: He is alert.  Skin: Skin is warm and dry. He is not diaphoretic.  Psychiatric: His behavior is normal.  Nursing note and vitals reviewed.    ED Treatments / Results  Labs (all labs ordered are listed, but only abnormal results are displayed) Labs Reviewed - No data to display  EKG None  Radiology No results found.  Procedures Procedures (including critical care time)  Medications Ordered in ED Medications - No data to display   Initial Impression / Assessment and Plan / ED Course  I have reviewed the triage vital signs and the nursing notes.  Pertinent labs & imaging results that were available during my care of the patient were reviewed by me and considered in my medical decision making (see chart for details).  Clinical Course as of Nov 22 8  Tue Nov 21, 2017  2328 Patient with acute renal failure and elevated potassium.  Basic metabolic panel(!) [AH]    Clinical Course  User Index [AH] Margarita Mail, PA-C    She with acute renal failure.  I spoke with Dr. Myna Hidalgo will admit the patient.  He will likely need to go to [facility].  I spoke with Dr. Jens Som about the fluid collection.  She states that the surgeons will consult on the patient.  He is currently unstable for any kind of surgical procedure if need be and will need medical admission.  Final Clinical Impressions(s) / ED Diagnoses   Final diagnoses:  None    ED Discharge Orders    None       Margarita Mail, PA-C 11/22/17 0041    Isla Pence, MD 11/24/17 [phone]

## 2017-11-22 ENCOUNTER — Other Ambulatory Visit: Payer: Self-pay

## 2017-11-22 ENCOUNTER — Encounter (HOSPITAL_COMMUNITY): Payer: Self-pay | Admitting: Family Medicine

## 2017-11-22 ENCOUNTER — Emergency Department (HOSPITAL_COMMUNITY): Payer: Medicare Other

## 2017-11-22 DIAGNOSIS — R0602 Shortness of breath: Secondary | ICD-10-CM | POA: Diagnosis not present

## 2017-11-22 DIAGNOSIS — Z7982 Long term (current) use of aspirin: Secondary | ICD-10-CM | POA: Diagnosis not present

## 2017-11-22 DIAGNOSIS — N281 Cyst of kidney, acquired: Secondary | ICD-10-CM | POA: Diagnosis present

## 2017-11-22 DIAGNOSIS — N509 Disorder of male genital organs, unspecified: Secondary | ICD-10-CM

## 2017-11-22 DIAGNOSIS — M109 Gout, unspecified: Secondary | ICD-10-CM | POA: Diagnosis present

## 2017-11-22 DIAGNOSIS — D696 Thrombocytopenia, unspecified: Secondary | ICD-10-CM | POA: Diagnosis present

## 2017-11-22 DIAGNOSIS — I498 Other specified cardiac arrhythmias: Secondary | ICD-10-CM | POA: Insufficient documentation

## 2017-11-22 DIAGNOSIS — I420 Dilated cardiomyopathy: Secondary | ICD-10-CM | POA: Diagnosis present

## 2017-11-22 DIAGNOSIS — E875 Hyperkalemia: Secondary | ICD-10-CM

## 2017-11-22 DIAGNOSIS — N179 Acute kidney failure, unspecified: Secondary | ICD-10-CM | POA: Diagnosis not present

## 2017-11-22 DIAGNOSIS — J449 Chronic obstructive pulmonary disease, unspecified: Secondary | ICD-10-CM

## 2017-11-22 DIAGNOSIS — I130 Hypertensive heart and chronic kidney disease with heart failure and stage 1 through stage 4 chronic kidney disease, or unspecified chronic kidney disease: Secondary | ICD-10-CM | POA: Diagnosis present

## 2017-11-22 DIAGNOSIS — R001 Bradycardia, unspecified: Secondary | ICD-10-CM | POA: Diagnosis present

## 2017-11-22 DIAGNOSIS — R188 Other ascites: Secondary | ICD-10-CM | POA: Diagnosis not present

## 2017-11-22 DIAGNOSIS — N5089 Other specified disorders of the male genital organs: Secondary | ICD-10-CM | POA: Diagnosis present

## 2017-11-22 DIAGNOSIS — I5022 Chronic systolic (congestive) heart failure: Secondary | ICD-10-CM

## 2017-11-22 DIAGNOSIS — N184 Chronic kidney disease, stage 4 (severe): Secondary | ICD-10-CM

## 2017-11-22 DIAGNOSIS — E872 Acidosis: Secondary | ICD-10-CM | POA: Diagnosis present

## 2017-11-22 DIAGNOSIS — R630 Anorexia: Secondary | ICD-10-CM | POA: Diagnosis present

## 2017-11-22 DIAGNOSIS — S301XXA Contusion of abdominal wall, initial encounter: Secondary | ICD-10-CM | POA: Diagnosis present

## 2017-11-22 DIAGNOSIS — I472 Ventricular tachycardia: Secondary | ICD-10-CM | POA: Diagnosis present

## 2017-11-22 DIAGNOSIS — Z933 Colostomy status: Secondary | ICD-10-CM | POA: Diagnosis not present

## 2017-11-22 DIAGNOSIS — W19XXXA Unspecified fall, initial encounter: Secondary | ICD-10-CM | POA: Diagnosis present

## 2017-11-22 DIAGNOSIS — Z79899 Other long term (current) drug therapy: Secondary | ICD-10-CM | POA: Diagnosis not present

## 2017-11-22 DIAGNOSIS — R109 Unspecified abdominal pain: Secondary | ICD-10-CM | POA: Diagnosis present

## 2017-11-22 LAB — CBC
HCT: 41.9 % (ref 39.0–52.0)
Hemoglobin: 13.6 g/dL (ref 13.0–17.0)
MCH: 25.9 pg — AB (ref 26.0–34.0)
MCHC: 32.5 g/dL (ref 30.0–36.0)
MCV: 79.8 fL (ref 78.0–100.0)
Platelets: 86 10*3/uL — ABNORMAL LOW (ref 150–400)
RBC: 5.25 MIL/uL (ref 4.22–5.81)
RDW: 21.1 % — AB (ref 11.5–15.5)
WBC: 6.2 10*3/uL (ref 4.0–10.5)

## 2017-11-22 LAB — POTASSIUM
POTASSIUM: 4.2 mmol/L (ref 3.5–5.1)
POTASSIUM: 5.4 mmol/L — AB (ref 3.5–5.1)
Potassium: 5.8 mmol/L — ABNORMAL HIGH (ref 3.5–5.1)
Potassium: 5 mmol/L (ref 3.5–5.1)

## 2017-11-22 LAB — CREATININE, URINE, RANDOM: Creatinine, Urine: 166 mg/dL

## 2017-11-22 LAB — BASIC METABOLIC PANEL
Anion gap: 8 (ref 5–15)
BUN: 113 mg/dL — AB (ref 6–20)
CALCIUM: 7.4 mg/dL — AB (ref 8.9–10.3)
CHLORIDE: 107 mmol/L (ref 101–111)
CO2: 26 mmol/L (ref 22–32)
CREATININE: 5.1 mg/dL — AB (ref 0.61–1.24)
GFR calc Af Amer: 12 mL/min — ABNORMAL LOW (ref >60)
GFR calc non Af Amer: 10 mL/min — ABNORMAL LOW (ref >60)
GLUCOSE: 95 mg/dL (ref 65–99)
Potassium: 4.5 mmol/L (ref 3.5–5.1)
Sodium: 141 mmol/L (ref 135–145)

## 2017-11-22 LAB — CBG MONITORING, ED: GLUCOSE-CAPILLARY: 110 mg/dL — AB (ref 65–99)

## 2017-11-22 LAB — SODIUM, URINE, RANDOM

## 2017-11-22 IMAGING — CR DG CHEST 1V PORT
1 series · 1 of 1 positions shown · non-contrast
Comparison: Yesterday.

CLINICAL DATA: Intubated.

EXAM:
PORTABLE CHEST 1 VIEW

[AP]
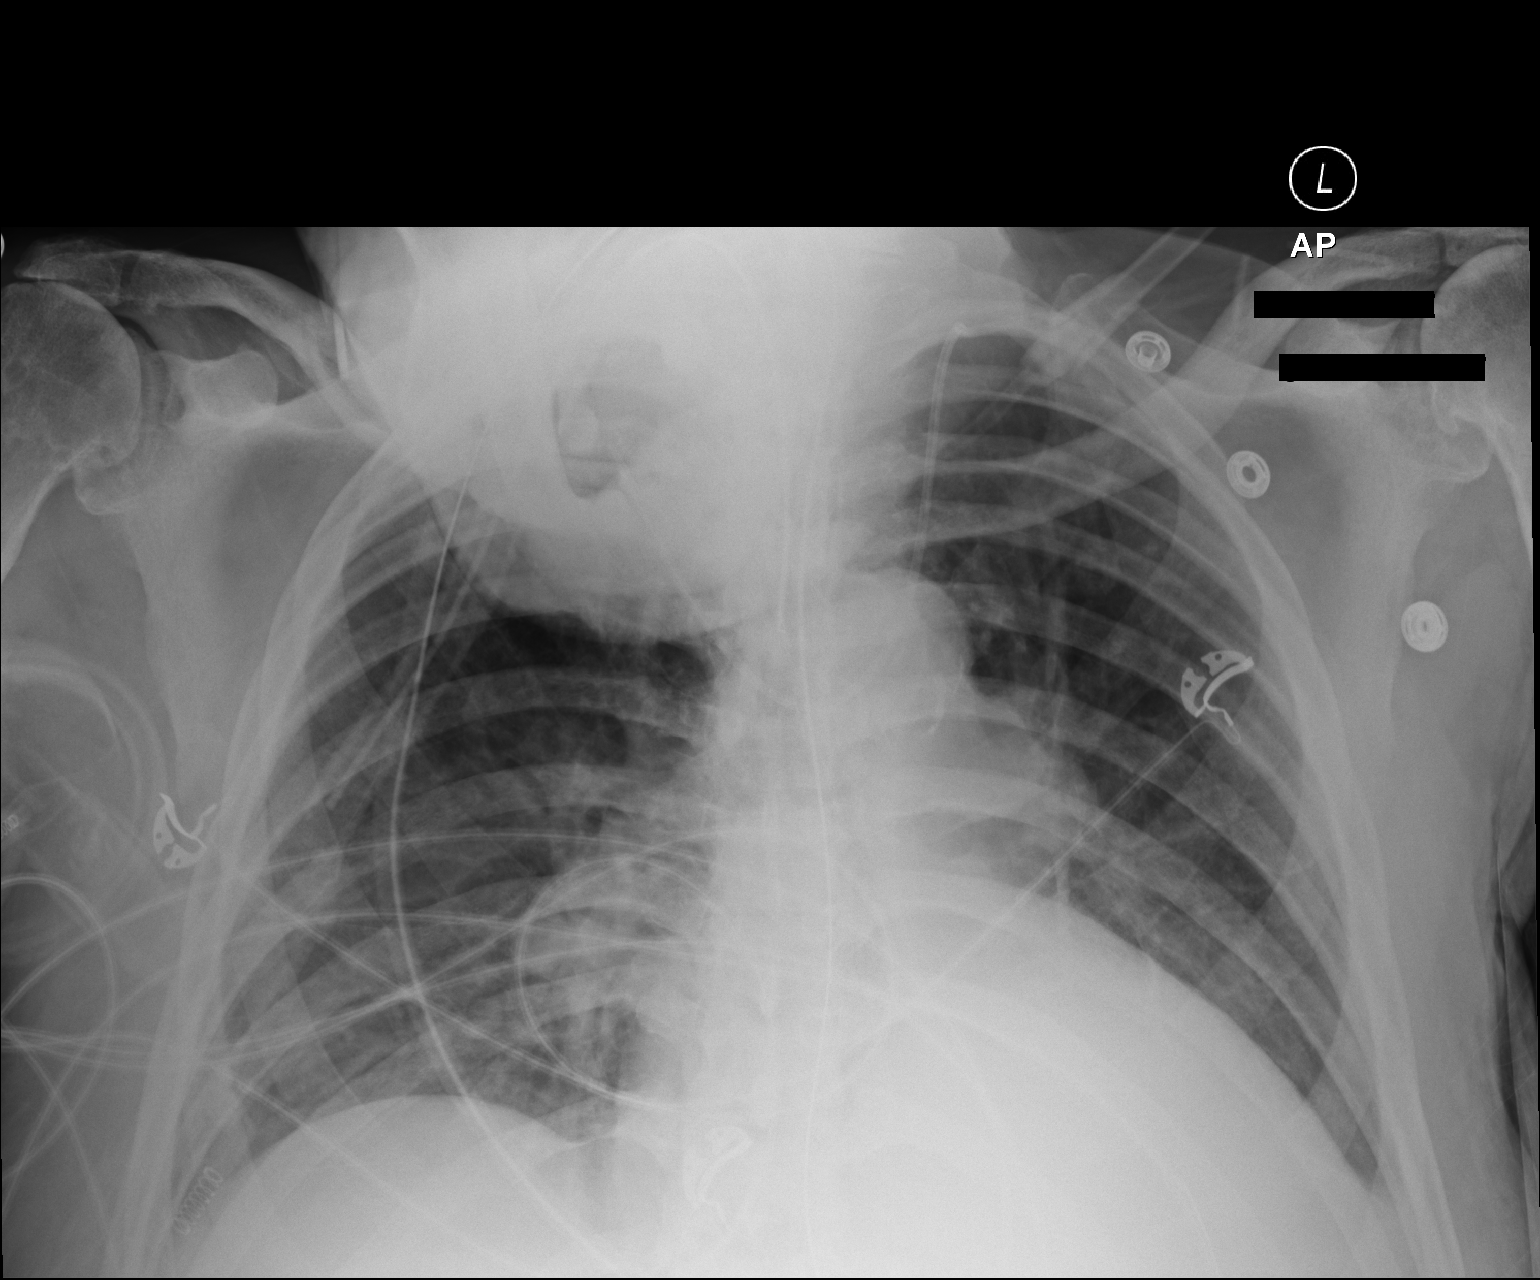

[1 of 1 positions shown; findings below may reference images not displayed]

FINDINGS: The patient's chin is obscuring the superior aspect of the right
lung. Endotracheal tube in satisfactory position. Nasogastric tube
extending into the stomach with its tip in the proximal stomach. The
left jugular catheter tip remains in the region of the origin of the
right innominate vein.

Stable enlarged cardiac silhouette. Dense left lower lobe airspace
opacity. Aortic arch calcifications. Bilateral shoulder degenerative
changes.
IMPRESSION: 1. Dense left lower lobe atelectasis or pneumonia.
2. The left jugular catheter tip remains in the region of the origin
of the right innominate vein.
3. Stable cardiomegaly.

## 2017-11-22 MED ORDER — ONDANSETRON HCL 4 MG/2ML IJ SOLN: 4.0000 mg | Freq: Four times a day (QID) | INTRAMUSCULAR | Status: DC | PRN

## 2017-11-22 MED ORDER — IPRATROPIUM-ALBUTEROL 0.5-2.5 (3) MG/3ML IN SOLN: 3.0000 mL | Freq: Four times a day (QID) | RESPIRATORY_TRACT | Status: DC | PRN

## 2017-11-22 MED ORDER — SODIUM CHLORIDE 0.9 % IV SOLN: INTRAVENOUS | Status: DC

## 2017-11-22 MED ORDER — SENNOSIDES-DOCUSATE SODIUM 8.6-50 MG PO TABS: 1.0000 | ORAL_TABLET | Freq: Every evening | ORAL | Status: DC | PRN

## 2017-11-22 MED ORDER — ACETAMINOPHEN 650 MG RE SUPP: 650.0000 mg | Freq: Four times a day (QID) | RECTAL | Status: DC | PRN

## 2017-11-22 MED ORDER — IPRATROPIUM-ALBUTEROL 20-100 MCG/ACT IN AERS: 1.0000 | INHALATION_SPRAY | Freq: Four times a day (QID) | RESPIRATORY_TRACT | Status: DC | PRN

## 2017-11-22 MED ORDER — SODIUM CHLORIDE 0.9 % IV SOLN
INTRAVENOUS | Status: DC
  Administered 2017-11-22: 50 mL/h via INTRAVENOUS
  Administered 2017-11-23: 04:00:00 via INTRAVENOUS

## 2017-11-22 MED ORDER — MOMETASONE FURO-FORMOTEROL FUM 200-5 MCG/ACT IN AERO
2.0000 | INHALATION_SPRAY | Freq: Two times a day (BID) | RESPIRATORY_TRACT | Status: DC
  Administered 2017-11-23 – 2017-11-24 (×3): 2 via RESPIRATORY_TRACT
  Filled 2017-11-22 (×3): qty 8.8

## 2017-11-22 MED ORDER — ONDANSETRON HCL 4 MG PO TABS
4.0000 mg | ORAL_TABLET | Freq: Four times a day (QID) | ORAL | Status: DC | PRN
Start: 2017-11-22 — End: 2017-11-24

## 2017-11-22 MED ORDER — HYDROCODONE-ACETAMINOPHEN 5-325 MG PO TABS: 1.0000 | ORAL_TABLET | ORAL | Status: DC | PRN

## 2017-11-22 MED ORDER — SODIUM CHLORIDE 0.9% FLUSH
3.0000 mL | Freq: Two times a day (BID) | INTRAVENOUS | Status: DC
  Administered 2017-11-22 – 2017-11-24 (×5): 3 mL via INTRAVENOUS

## 2017-11-22 MED ORDER — STERILE WATER FOR INJECTION IV SOLN
INTRAVENOUS | Status: DC
  Administered 2017-11-22: 02:00:00 via INTRAVENOUS
  Filled 2017-11-22: qty 850

## 2017-11-22 MED ORDER — SODIUM CHLORIDE 0.9 % IV BOLUS
500.0000 mL | Freq: Once | INTRAVENOUS | Status: AC
  Administered 2017-11-22: 500 mL via INTRAVENOUS

## 2017-11-22 MED ORDER — ACETAMINOPHEN 325 MG PO TABS: 650.0000 mg | ORAL_TABLET | Freq: Four times a day (QID) | ORAL | Status: DC | PRN

## 2017-11-22 NOTE — ED Notes (Signed)
Bed: WA12 Expected date:  Expected time:  Means of arrival:  Comments: Room 4 

## 2017-11-22 NOTE — ED Notes (Signed)
ED TO INPATIENT HANDOFF REPORT  Name/Age/Gender [name] 72 y.o. male  Code Status    Code Status Orders  (From admission, onward)        Start     Ordered   11/22/17 0130  Full code  Continuous     11/22/17 0132    Code Status History    Date Active Date Inactive Code Status Order ID Comments User Context   11/16/2016 1758 11/20/2016 1933 Full Code [id number]  Caren Griffins, MD Inpatient   06/07/2016 1845 06/15/2016 1621 Full Code [id number]  Corey Harold, NP ED      Home/SNF/Other Home  Chief Complaint Shortness of breath; Abdominal Pain  Level of Care/Admitting Diagnosis ED Disposition    ED Disposition Condition Comment   Admit  Hospital Area: [facility] [[id number]]  Level of Care: Med-Surg [16]  Diagnosis: AKI (acute kidney injury) [facility]) [[id number]]  Admitting Physician: Charlynne Cousins [[id number]]  Attending Physician: Charlynne Cousins [[id number]]  Estimated length of stay: past midnight tomorrow  Certification:: I certify this patient will need inpatient services for at least 2 midnights  PT Class (Do Not Modify): Inpatient [101]  PT Acc Code (Do Not Modify): Private [1]       Medical History Past Medical History:  Diagnosis Date  . Arthritis   . Chronic kidney disease, stage 3 ([place])   . Chronic systolic CHF (congestive heart failure) ([place]) 07/04/2016   a. Echo 10/17: Moderate concentric LVH, EF 20-25, diffuse HK, anteroseptal, inferior and inferoseptal akinesis, grade 2 diastolic dysfunction, severe LAE  //  b. Echo 08/16/16: EF 20-25, severe diff HK, inf-lat, inf and inf-septal AK and scarring c/w RCA distribution infarct, Gr 2 DD, mild MR, mod to severe LAE, mild RVE, mild RAE, PASP 31 (Nuc study neg for scar)  . Colon obstruction (HCC)    Sigmoid  . COPD (chronic obstructive pulmonary disease) ([place])   . Dilated cardiomyopathy ([place])    A. LHC 7/06: normal coronary arteries  //  b. Myoview 12/17: severely dilated LV, frequent  multifocal PVCs, EF < 30, no infarct or ischemia (c/w dilated NICM), high risk  . Diverticulitis   . Erectile dysfunction   . Fistula    chronic enterocutaneous  . Gout   . Hypertension   . Inguinal hernia   . NSVT (nonsustained ventricular tachycardia) (HCC)    A. admx in 62/94 with metabolic derangement in setting of septic shock - NSVT tx with Amio/Lidocaine; EF 20-25 on echo; Dc'd on beta-blocker (Coreg) only (no Amiodarone)  . Osteoarthritis   . Perforated bowel ([place])    perforation of the cecum  . Peritonitis ([place])   . Scrotal mass     Allergies No Known Allergies  IV Location/Drains/Wounds Patient Lines/Drains/Airways Status   Active Line/Drains/Airways    Name:   Placement date:   Placement time:   Site:   Days:   Peripheral IV 11/22/17 Left Antecubital   11/22/17    0158    Antecubital   less than 1   Peripheral IV 11/21/17 Left Hand   11/21/17    1950    Hand   1   Closed System Drain 1 Right Other (Comment) Bulb (JP) 19 Fr.   11/19/16    1738    Other (Comment)   368   Colostomy RUQ   06/07/16    2010    RUQ   533   Airway   11/19/16    1758  368   Incision (Closed) 11/19/16 Perineum Other (Comment)   11/19/16    1722     368   Pressure Injury 06/07/16 Stage II -  Partial thickness loss of dermis presenting as a shallow open ulcer with a red, pink wound bed without slough.    06/07/16    2010     533   Wound / Incision (Open or Dehisced) 06/07/16 Other (Comment) Scrotum Right;Left   06/07/16    2010    Scrotum   533   Wound / Incision (Open or Dehisced) 06/07/16 Diabetic ulcer Toe (Comment  which one) Left    06/07/16    2010    Toe (Comment  which one)   533          Labs/Imaging Results for orders placed or performed during the hospital encounter of 11/21/17 (from the past 48 hour(s))  Basic metabolic panel     Status: Abnormal   Collection Time: 11/21/17  9:43 PM  Result Value Ref Range   Sodium 137 135 - 145 mmol/L   Potassium 5.8 (H) 3.5 - 5.1 mmol/L    Chloride 118 (H) 101 - 111 mmol/L   CO2 8 (L) 22 - 32 mmol/L   Glucose, Bld 116 (H) 65 - 99 mg/dL   BUN 130 (H) 6 - 20 mg/dL    Comment: RESULTS CONFIRMED BY MANUAL DILUTION   Creatinine, Ser 6.17 (H) 0.61 - 1.24 mg/dL   Calcium 8.9 8.9 - 10.3 mg/dL   GFR calc non Af Amer 8 (L) >60 mL/min   GFR calc Af Amer 9 (L) >60 mL/min    Comment: (NOTE) The eGFR has been calculated using the CKD EPI equation. This calculation has not been validated in all clinical situations. eGFR's persistently <60 mL/min signify possible Chronic Kidney Disease.    Anion gap 11 5 - 15    Comment: Performed at [facility], [place] [address], [place], [place] [address]  CBC with Differential/Platelet     Status: Abnormal   Collection Time: 11/21/17 10:24 PM  Result Value Ref Range   WBC 7.5 4.0 - 10.5 K/uL    Comment: WHITE COUNT CONFIRMED ON SMEAR   RBC 6.50 (H) 4.22 - 5.81 MIL/uL   Hemoglobin 17.1 (H) 13.0 - 17.0 g/dL   HCT 52.9 (H) 39.0 - 52.0 %   MCV 81.4 78.0 - 100.0 fL   MCH 26.3 26.0 - 34.0 pg   MCHC 32.3 30.0 - 36.0 g/dL   RDW 21.4 (H) 11.5 - 15.5 %   Platelets 111 (L) 150 - 400 K/uL    Comment: REPEATED TO VERIFY SPECIMEN CHECKED FOR CLOTS PLATELET COUNT CONFIRMED BY SMEAR    Neutrophils Relative % 78 %   Neutro Abs 5.8 1.7 - 7.7 K/uL   Lymphocytes Relative 19 %   Lymphs Abs 1.4 0.7 - 4.0 K/uL   Monocytes Relative 3 %   Monocytes Absolute 0.2 0.1 - 1.0 K/uL   Eosinophils Relative 0 %   Eosinophils Absolute 0.0 0.0 - 0.7 K/uL   Basophils Relative 0 %   Basophils Absolute 0.0 0.0 - 0.1 K/uL   RBC Morphology MORPHOLOGY UNREMARKABLE     Comment: Performed at [facility], [place] [address], [place], [place] [address]  Potassium     Status: Abnormal   Collection Time: 11/22/17  1:29 AM  Result Value Ref Range   Potassium 5.8 (H) 3.5 - 5.1 mmol/L    Comment: Performed at [facility]  [place] [address] [place], [place] [address]  Sodium,  urine, random     Status: None   Collection Time: 11/22/17  1:38 AM  Result Value Ref Range   Sodium, Ur <10 mmol/L    Comment: Performed at [facility], [place] [address], [place], [place] [address]  Creatinine, urine, random     Status: None   Collection Time: 11/22/17  1:38 AM  Result Value Ref Range   Creatinine, Urine 166.00 mg/dL    Comment: Performed at [facility], [place] [address] [place], [place] [address]  Basic metabolic panel     Status: Abnormal   Collection Time: 11/22/17  5:29 AM  Result Value Ref Range   Sodium 141 135 - 145 mmol/L   Potassium 4.5 3.5 - 5.1 mmol/L    Comment: DELTA CHECK NOTED REPEATED TO VERIFY    Chloride 107 101 - 111 mmol/L   CO2 26 22 - 32 mmol/L   Glucose, Bld 95 65 - 99 mg/dL   BUN 113 (H) 6 - 20 mg/dL    Comment: RESULTS CONFIRMED BY MANUAL DILUTION   Creatinine, Ser 5.10 (H) 0.61 - 1.24 mg/dL   Calcium 7.4 (L) 8.9 - 10.3 mg/dL   GFR calc non Af Amer 10 (L) >60 mL/min   GFR calc Af Amer 12 (L) >60 mL/min    Comment: (NOTE) The eGFR has been calculated using the CKD EPI equation. This calculation has not been validated in all clinical situations. eGFR's persistently <60 mL/min signify possible Chronic Kidney Disease.    Anion gap 8 5 - 15    Comment: Performed at [facility], [place] [address] [place], [place] [address]  CBC     Status: Abnormal   Collection Time: 11/22/17  5:29 AM  Result Value Ref Range   WBC 6.2 4.0 - 10.5 K/uL   RBC 5.25 4.22 - 5.81 MIL/uL   Hemoglobin 13.6 13.0 - 17.0 g/dL    Comment: REPEATED TO VERIFY DELTA CHECK NOTED    HCT 41.9 39.0 - 52.0 %   MCV 79.8 78.0 - 100.0 fL   MCH 25.9 (L) 26.0 - 34.0 pg   MCHC 32.5 30.0 - 36.0 g/dL   RDW 21.1 (H) 11.5 - 15.5 %   Platelets 86 (L) 150 - 400 K/uL    Comment: CONSISTENT WITH PREVIOUS RESULT Performed at [place] [address] [place], [place] [address]   CBG monitoring, ED      Status: Abnormal   Collection Time: 11/22/17 12:34 PM  Result Value Ref Range   Glucose-Capillary 110 (H) 65 - 99 mg/dL  Potassium     Status: None   Collection Time: 11/22/17 12:37 PM  Result Value Ref Range   Potassium 4.2 3.5 - 5.1 mmol/L    Comment: Performed at [facility], [place] [address] [place], [place] [address]   Ct Abdomen Pelvis Wo Contrast  Addendum Date: 11/21/2017   ADDENDUM REPORT: 11/21/2017 22:51 ADDENDUM: Dense gallbladder most compatible with gallbladder sludge though, could reflect vicarious excretion contrast. No CT findings of acute cholecystitis. Electronically Signed   By: Elon Alas M.D.   On: 11/21/2017 22:51   Result Date: 11/21/2017 CLINICAL DATA:  Intermittent shortness of breath for week. LEFT upper quadrant pain. Fell 1 week ago. History of colostomy, peritonitis, diverticulitis, chronic kidney disease. EXAM: CT ABDOMEN AND PELVIS WITHOUT CONTRAST TECHNIQUE: Multidetector CT imaging of the abdomen and pelvis was  performed following the standard protocol without IV contrast. COMPARISON:  MRI of the abdomen and pelvis August 16, 2017 and CT abdomen and pelvis June 07, 2016 FINDINGS: LOWER CHEST: Dependent atelectasis. Punctate calcified granuloma. The heart is moderately enlarged. Mild coronary artery calcification. No pericardial effusion. HEPATOBILIARY: Dense gallbladder.  Negative noncontrast CT liver. PANCREAS: Normal. SPLEEN: Normal. ADRENALS/URINARY TRACT: Kidneys are orthotopic, demonstrating normal size and morphology. No nephrolithiasis, hydronephrosis; limited assessment for renal masses on this nonenhanced examination. Multiple complex renal masses. 5.1 cm LEFT interpolar cyst. The unopacified ureters are normal in course and caliber. Urinary bladder is partially distended and unremarkable. 2.3 cm LEFT adrenal mass previously characterized as benign adenoma. STOMACH/BOWEL: Status post colectomy. RIGHT lower quadrant ostomy with  multiple loops of small bowel. Mild wide necked parastomal hernia containing small bowel. Similar patulous surgical bowel anastomosis. Bowel is overall normal in course and caliber. VASCULAR/LYMPHATIC: Aortoiliac vessels are normal in course and caliber. Severe calcific atherosclerosis. No lymphadenopathy by CT size criteria. REPRODUCTIVE: Partially imaged RIGHT scrotal mass, possible testicle within the inguinal canal. OTHER: No intraperitoneal free fluid or free air. MUSCULOSKELETAL: New LEFT paraumbilical 2.5 x 2.7 cm fluid collection. Anterior abdominal wall scarring. Severe degenerative change of bilateral hips. Multilevel advanced facet arthropathy. IMPRESSION: 1. New 2.5 x 2.7 cm paraumbilical anterior abdominal wall fluid collection, given history of trauma, this could reflect hematoma or seroma. 2. Partially imaged complex RIGHT scrotal mass, incompletely evaluated. Recommend correlation with clinical examination. 3. Bilateral complex renal cysts as characterized on prior MRI. Aortic Atherosclerosis (ICD10-I70.0). Electronically Signed: By: Elon Alas M.D. On: 11/21/2017 21:41   Dg Chest Port 1 View  Result Date: 11/22/2017 CLINICAL DATA:  Dyspnea times 48 hours EXAM: PORTABLE CHEST 1 VIEW COMPARISON:  11/16/2016 FINDINGS: Stable cardiomegaly with aortic atherosclerosis. Minimal interstitial edema and left basilar atelectasis. No effusion or pneumothorax. Osteoarthritis of the [facility] and glenohumeral joints with high-riding humeral heads compatible with chronic rotator cuff tears. IMPRESSION: Cardiomegaly with mild vascular congestion and interstitial edema. Electronically Signed   By: Ashley Royalty M.D.   On: 11/22/2017 01:09    Pending Labs Unresulted Labs (From admission, onward)   Start     Ordered   11/23/17 1950  Basic metabolic panel  Daily,   R     11/22/17 1216   11/23/17 0500  CBC  Tomorrow morning,   STAT     11/22/17 1220   11/22/17 0133  Urea nitrogen, urine  Once,   R      11/22/17 0132   11/22/17 0129  Potassium  Now then every 4 hours,   STAT    Comments:  Until normal twice.    11/22/17 0132   11/21/17 2019  CBC with Differential  Once,   STAT     11/21/17 2019      Vitals/Pain Today's Vitals   11/22/17 1330 11/22/17 1400 11/22/17 1500 11/22/17 1530  BP: 102/63 101/66 (!) 112/57 118/72  Pulse: 63 63 64 64  Resp: 20 (!) 22 (!) 21 (!) 25  Temp:      TempSrc:      SpO2: 95% 96% 95% 94%  Weight:      Height:      PainSc:        Isolation Precautions No active isolations  Medications Medications  mometasone-formoterol (DULERA) 200-5 MCG/ACT inhaler 2 puff (2 puffs Inhalation Not Given 11/22/17 1232)  sodium chloride flush (NS) 0.9 % injection 3 mL (3 mLs Intravenous Given 11/22/17 1056)  acetaminophen (TYLENOL)  tablet 650 mg (has no administration in time range)    Or  acetaminophen (TYLENOL) suppository 650 mg (has no administration in time range)  HYDROcodone-acetaminophen (NORCO/VICODIN) 5-325 MG per tablet 1-2 tablet (has no administration in time range)  senna-docusate (Senokot-S) tablet 1 tablet (has no administration in time range)  ondansetron (ZOFRAN) tablet 4 mg (has no administration in time range)    Or  ondansetron (ZOFRAN) injection 4 mg (has no administration in time range)  ipratropium-albuterol (DUONEB) 0.5-2.5 (3) MG/3ML nebulizer solution 3 mL (has no administration in time range)  0.9 %  sodium chloride infusion (50 mL/hr Intravenous New Bag/Given 11/22/17 1320)  fentaNYL (SUBLIMAZE) injection 50 mcg (50 mcg Intravenous Given 11/21/17 2039)  ondansetron (ZOFRAN) injection 4 mg (4 mg Intravenous Given 11/21/17 2038)  sodium chloride 0.9 % bolus 1,000 mL (0 mLs Intravenous Stopped 11/22/17 0051)  sodium chloride 0.9 % bolus 500 mL (0 mLs Intravenous Stopped 11/22/17 1256)    Mobility non-ambulatory

## 2017-11-22 NOTE — Consult Note (Addendum)
Surgical Consultation Requesting provider: Opyd  CC: "I'd be a lot better if I was home in my own bed"  HPI: History augmented by chart review as the patient is not a great historian.  This is a very pleasant 72 year old man with significant medical and surgical history as listed below who was brought to the [facility] by EMS with presenting complaint of shortness of breath and a painful nodule in his mid abdomen.  Nodule has been present for over a week and arose immediately after a fall, where the patient states he may have landed on his abdomen on a fence.  Per report, the patient has had a poor appetite for weeks to months.  Currently he denies nausea.  Apparently he has not been taking his medications.  His abdominal surgical history is notable for: March 2006 Dalbert Batman): Exploratory laparotomy with extended right colectomy and end ileostomy/transverse colon mucous fistula for cecal perforation secondary to obstructing sigmoid mass (ultimately was diverticulitis).  This was complicated by dehiscence and enterocutaneous fistula requiring take back a few weeks later.  His hospital course was complicated by pneumonia, prolonged ventilator dependence requiring tracheostomy, the onset of nonischemic cardiomyopathy and heart failure... July 2006 Dalbert Batman): Resection of enterocutaneous fistula with small bowel resection, extensive adhesiolysis, completion colectomy and closure of rectal stump, repair of abdominal wall defect with 7 x 9 inch Bard Collamend implant mesh which the op note describes as an anterior onlay repair.  No Known Allergies  Past Medical History:  Diagnosis Date  . Arthritis   . Chronic kidney disease, stage 3 ([place])   . Chronic systolic CHF (congestive heart failure) ([place]) 07/04/2016   a. Echo 10/17: Moderate concentric LVH, EF 20-25, diffuse HK, anteroseptal, inferior and inferoseptal akinesis, grade 2 diastolic dysfunction, severe LAE  //  b. Echo 08/16/16: EF 20-25, severe diff  HK, inf-lat, inf and inf-septal AK and scarring c/w RCA distribution infarct, Gr 2 DD, mild MR, mod to severe LAE, mild RVE, mild RAE, PASP 31 (Nuc study neg for scar)  . Colon obstruction (HCC)    Sigmoid  . COPD (chronic obstructive pulmonary disease) ([place])   . Dilated cardiomyopathy ([place])    A. LHC 7/06: normal coronary arteries  //  b. Myoview 12/17: severely dilated LV, frequent multifocal PVCs, EF < 30, no infarct or ischemia (c/w dilated NICM), high risk  . Diverticulitis   . Erectile dysfunction   . Fistula    chronic enterocutaneous  . Gout   . Hypertension   . Inguinal hernia   . NSVT (nonsustained ventricular tachycardia) (HCC)    A. admx in 67/89 with metabolic derangement in setting of septic shock - NSVT tx with Amio/Lidocaine; EF 20-25 on echo; Dc'd on beta-blocker (Coreg) only (no Amiodarone)  . Osteoarthritis   . Perforated bowel ([place])    perforation of the cecum  . Peritonitis ([place])   . Scrotal mass     Past Surgical History:  Procedure Laterality Date  . COLON SURGERY    . COLOSTOMY    . CONDYLOMA EXCISION/FULGURATION Right 11/19/2016   Procedure: CONDYLOMA REMOVAL, SCROTAL EXPLORATION, HYDROCELE ASPIRATION;  Surgeon: Irine Seal, MD;  Location: [facility];  Service: Urology;  Laterality: Right;  Nonischemic cardiomyopathy history with EF 20-25%    Family History  Problem Relation Age of Onset  . Obesity Daughter     Social History   Socioeconomic History  . Marital status: Divorced    Spouse name: Not on file  . Number of children:  Not on file  . Years of education: Not on file  . Highest education level: Not on file  Occupational History  . Not on file  Social Needs  . Financial resource strain: Not on file  . Food insecurity:    Worry: Not on file    Inability: Not on file  . Transportation needs:    Medical: Not on file    Non-medical: Not on file  Tobacco Use  . Smoking status: Current Some Day Smoker    Packs/day: 0.25  . Smokeless tobacco:  Never Used  Substance and Sexual Activity  . Alcohol use: No  . Drug use: Not on file  . Sexual activity: Not on file  Lifestyle  . Physical activity:    Days per week: Not on file    Minutes per session: Not on file  . Stress: Not on file  Relationships  . Social connections:    Talks on phone: Not on file    Gets together: Not on file    Attends religious service: Not on file    Active member of club or organization: Not on file    Attends meetings of clubs or organizations: Not on file    Relationship status: Not on file  Other Topics Concern  . Not on file  Social History Narrative  . Not on file    No current facility-administered medications on file prior to encounter.    Current Outpatient Medications on File Prior to Encounter  Medication Sig Dispense Refill  . albuterol (PROVENTIL) (2.5 MG/3ML) 0.083% nebulizer solution Take 2.5 mg by nebulization every 6 (six) hours as needed for wheezing or shortness of breath.    . allopurinol (ZYLOPRIM) 100 MG tablet Take 100 mg by mouth daily.    Marland Kitchen amiodarone (PACERONE) 200 MG tablet Take 1 tablet (200 mg total) by mouth daily. 90 tablet 3  . aspirin EC 81 MG tablet Take 81 mg by mouth daily.    . carvedilol (COREG) 6.25 MG tablet Take 1.5 tablets (9.375 mg total) by mouth 2 (two) times daily. 270 tablet 3  . diclofenac sodium (VOLTAREN) 1 % GEL Apply 2 g topically 4 (four) times daily as needed (for pain).     . ferrous sulfate 325 (65 FE) MG tablet Take 325 mg by mouth 3 (three) times daily with meals.    . furosemide (LASIX) 40 MG tablet Take 40 mg by mouth daily.    . imiquimod (ALDARA) 5 % cream Apply 1 application topically at bedtime.    . Ipratropium-Albuterol (COMBIVENT RESPIMAT) 20-100 MCG/ACT AERS respimat Inhale 1 puff into the lungs every 6 (six) hours as needed for wheezing or shortness of breath.    . nystatin (MYCOSTATIN/NYSTOP) powder Apply 1 g topically 3 (three) times daily as needed (for itching/irritation).      . SYMBICORT 160-4.5 MCG/ACT inhaler Inhale 2 puffs into the lungs 2 (two) times daily.   0  . tiZANidine (ZANAFLEX) 4 MG tablet Take 4 mg by mouth 2 (two) times daily as needed for muscle spasms.     . traMADol (ULTRAM) 50 MG tablet Take 50 mg by mouth 2 (two) times daily.     . sildenafil (REVATIO) 20 MG tablet take 3 to 5 tablets by mouth DAILY AS NEEDED 30 MINUTES BEFORE INTERCOURSE  0    Review of Systems: a complete, 10pt review of systems was completed with pertinent positives and negatives as documented in the HPI  Physical Exam: Vitals:  11/22/17 0207 11/22/17 0230  BP: 105/70 109/60  Pulse: (!) 57 (!) 49  Resp: 12 (!) 21  Temp:    SpO2: 95% 96%   Gen: Alert and cooperative, chronically ill-appearing Head: normocephalic, atraumatic Eyes: extraocular motions intact, anicteric.  Neck: supple without mass or thyromegaly Chest: unlabored respirations, symmetrical air entry, clear bilaterally   Cardiovascular: Bradycardic with palpable radial pulses, no pedal edema Abdomen: soft, nondistended, well-healed midline scar and right lower quadrant ileostomy which is productive with gas and stool in the bag.  Just left of midline a few centimeters superior to the umbilicus there is a firm tender subcutaneous mass approximately 5 cm in diameter with faint overlying erythema and mild purple discoloration. Extremities: warm, without edema, no deformities  Neuro: grossly intact Psych: appropriate mood and affect, normal insight  Skin: warm and dry   CBC Latest Ref Rng & Units 11/21/2017 11/20/2016 11/19/2016  WBC 4.0 - 10.5 K/uL 7.5 11.2(H) -  Hemoglobin 13.0 - 17.0 g/dL 17.1(H) 7.0(L) 7.3(L)  Hematocrit 39.0 - 52.0 % 52.9(H) 24.8(L) 25.7(L)  Platelets 150 - 400 K/uL 111(L) 244 -    CMP Latest Ref Rng & Units 11/22/2017 11/21/2017 08/16/2017  Glucose 65 - 99 mg/dL - 116(H) -  BUN 6 - 20 mg/dL - 130(H) -  Creatinine 0.61 - 1.24 mg/dL - 6.17(H) 1.90(H)  Sodium 135 - 145 mmol/L - 137 -   Potassium 3.5 - 5.1 mmol/L 5.8(H) 5.8(H) -  Chloride 101 - 111 mmol/L - 118(H) -  CO2 22 - 32 mmol/L - 8(L) -  Calcium 8.9 - 10.3 mg/dL - 8.9 -  Total Protein 6.0 - 8.5 g/dL - - -  Total Bilirubin 0.0 - 1.2 mg/dL - - -  Alkaline Phos 39 - 117 IU/L - - -  AST 0 - 40 IU/L - - -  ALT 0 - 44 IU/L - - -    Lab Results  Component Value Date   INR 1.15 11/16/2016    Imaging: Ct Abdomen Pelvis Wo Contrast  Addendum Date: 11/21/2017   ADDENDUM REPORT: 11/21/2017 22:51 ADDENDUM: Dense gallbladder most compatible with gallbladder sludge though, could reflect vicarious excretion contrast. No CT findings of acute cholecystitis. Electronically Signed   By: Elon Alas M.D.   On: 11/21/2017 22:51   Result Date: 11/21/2017 CLINICAL DATA:  Intermittent shortness of breath for week. LEFT upper quadrant pain. Fell 1 week ago. History of colostomy, peritonitis, diverticulitis, chronic kidney disease. EXAM: CT ABDOMEN AND PELVIS WITHOUT CONTRAST TECHNIQUE: Multidetector CT imaging of the abdomen and pelvis was performed following the standard protocol without IV contrast. COMPARISON:  MRI of the abdomen and pelvis August 16, 2017 and CT abdomen and pelvis June 07, 2016 FINDINGS: LOWER CHEST: Dependent atelectasis. Punctate calcified granuloma. The heart is moderately enlarged. Mild coronary artery calcification. No pericardial effusion. HEPATOBILIARY: Dense gallbladder.  Negative noncontrast CT liver. PANCREAS: Normal. SPLEEN: Normal. ADRENALS/URINARY TRACT: Kidneys are orthotopic, demonstrating normal size and morphology. No nephrolithiasis, hydronephrosis; limited assessment for renal masses on this nonenhanced examination. Multiple complex renal masses. 5.1 cm LEFT interpolar cyst. The unopacified ureters are normal in course and caliber. Urinary bladder is partially distended and unremarkable. 2.3 cm LEFT adrenal mass previously characterized as benign adenoma. STOMACH/BOWEL: Status post colectomy.  RIGHT lower quadrant ostomy with multiple loops of small bowel. Mild wide necked parastomal hernia containing small bowel. Similar patulous surgical bowel anastomosis. Bowel is overall normal in course and caliber. VASCULAR/LYMPHATIC: Aortoiliac vessels are normal in course and caliber. Severe  calcific atherosclerosis. No lymphadenopathy by CT size criteria. REPRODUCTIVE: Partially imaged RIGHT scrotal mass, possible testicle within the inguinal canal. OTHER: No intraperitoneal free fluid or free air. MUSCULOSKELETAL: New LEFT paraumbilical 2.5 x 2.7 cm fluid collection. Anterior abdominal wall scarring. Severe degenerative change of bilateral hips. Multilevel advanced facet arthropathy. IMPRESSION: 1. New 2.5 x 2.7 cm paraumbilical anterior abdominal wall fluid collection, given history of trauma, this could reflect hematoma or seroma. 2. Partially imaged complex RIGHT scrotal mass, incompletely evaluated. Recommend correlation with clinical examination. 3. Bilateral complex renal cysts as characterized on prior MRI. Aortic Atherosclerosis (ICD10-I70.0). Electronically Signed: By: Elon Alas M.D. On: 11/21/2017 21:41   Dg Chest Port 1 View  Result Date: 11/22/2017 CLINICAL DATA:  Dyspnea times 48 hours EXAM: PORTABLE CHEST 1 VIEW COMPARISON:  11/16/2016 FINDINGS: Stable cardiomegaly with aortic atherosclerosis. Minimal interstitial edema and left basilar atelectasis. No effusion or pneumothorax. Osteoarthritis of the [facility] and glenohumeral joints with high-riding humeral heads compatible with chronic rotator cuff tears. IMPRESSION: Cardiomegaly with mild vascular congestion and interstitial edema. Electronically Signed   By: Ashley Royalty M.D.   On: 11/22/2017 01:11    A/P: 72 year old gentleman with subcutaneous abdominal wall mass, likely hematoma related to his recent fall.  He is in acute renal failure with hyperkalemia, acidosis, uremia and is going to be admitted to the hospitalist service at [facility]  for further evaluation and treatment. Some improvement on this AM's labs with fluid/bicarb tx. At this point the hematoma feels somewhat firm and is likely organized, would recommend local application of ice and/or heat in addition to pain control and continued observation.  Hold off on intervention.  Once his renal function is stabilized could consider ultrasound-guided aspiration versus I&D if symptomatically worsening.   Romana Juniper, MD [facility], [place] Pager [phone]

## 2017-11-22 NOTE — H&P (Signed)
History and Physical    [name] IEP:[id number] DOB: March 27, 1946 DOA: 11/21/2017  PCP: [name], MD   Patient coming from: Home  Chief Complaint: Abdominal wall pain with red lump  HPI: [name] is a 72 y.o. male with medical history significant for COPD, chronic systolic CHF, chronic kidney disease stage IV, and scrotal mass followed by urology, now presenting to the emergency department for evaluation of severe abdominal pain at the site of a red lump in the mid abdominal wall.  Patient reports that he has had a poor appetite for weeks to months, family notes that he barely eats or drinks anything anymore, and he reports falling approximately 2 weeks ago, hitting his abdomen, and now presents with severe pain in the mid abdomen, just left of center where there is an erythematous and swollen area.  Patient denies chest pain, reports mild chronic dyspnea, denies leg swelling, and denies fevers or chills.  He reports that he has not been taking his medications recently.  Family does not remember the last time he took his medicines.  ED Course: Upon arrival to the ED, patient is found to be afebrile, saturating well on room air, bradycardic in the mid 40s, and vitals otherwise normal.  EKG features a sinus or ectopic atrial bradycardia with nonspecific IVCD and LVH.  CT of the abdomen and pelvis reveals a 2.5 x 2.7 cm fluid collection adjacent to the umbilicus in the anterior abdominal wall suggestive of seroma or hematoma.  Chemistry panel is concerning for potassium 5.8, bicarbonate 8, BUN 130, and creatinine of 6.17.  CBC is notable for a hemoglobin of 17.1, up from 7.0 a year ago.  ED physician consulted with surgery regarding the abdominal wall fluid collection.  Patient was treated with a liter of normal saline, 50 mcg fentanyl, and Zofran in the ED.  He remains bradycardic with stable blood pressure and will be admitted to the telemetry unit at [facility] for  ongoing evaluation and management of acute renal failure superimposed on chronic kidney disease stage IV.  Review of Systems:  All other systems reviewed and apart from HPI, are negative.  Past Medical History:  Diagnosis Date  . Arthritis   . Chronic kidney disease, stage 3 ([place])   . Chronic systolic CHF (congestive heart failure) ([place]) 07/04/2016   a. Echo 10/17: Moderate concentric LVH, EF 20-25, diffuse HK, anteroseptal, inferior and inferoseptal akinesis, grade 2 diastolic dysfunction, severe LAE  //  b. Echo 08/16/16: EF 20-25, severe diff HK, inf-lat, inf and inf-septal AK and scarring c/w RCA distribution infarct, Gr 2 DD, mild MR, mod to severe LAE, mild RVE, mild RAE, PASP 31 (Nuc study neg for scar)  . Colon obstruction (HCC)    Sigmoid  . COPD (chronic obstructive pulmonary disease) ([place])   . Dilated cardiomyopathy ([place])    A. LHC 7/06: normal coronary arteries  //  b. Myoview 12/17: severely dilated LV, frequent multifocal PVCs, EF < 30, no infarct or ischemia (c/w dilated NICM), high risk  . Diverticulitis   . Erectile dysfunction   . Fistula    chronic enterocutaneous  . Gout   . Hypertension   . Inguinal hernia   . NSVT (nonsustained ventricular tachycardia) (HCC)    A. admx in 66/06 with metabolic derangement in setting of septic shock - NSVT tx with Amio/Lidocaine; EF 20-25 on echo; Dc'd on beta-blocker (Coreg) only (no Amiodarone)  . Osteoarthritis   . Perforated bowel ([place])  perforation of the cecum  . Peritonitis ([place])   . Scrotal mass     Past Surgical History:  Procedure Laterality Date  . COLON SURGERY    . COLOSTOMY    . CONDYLOMA EXCISION/FULGURATION Right 11/19/2016   Procedure: CONDYLOMA REMOVAL, SCROTAL EXPLORATION, HYDROCELE ASPIRATION;  Surgeon: Irine Seal, MD;  Location: [facility] ORS;  Service: Urology;  Laterality: Right;  Nonischemic cardiomyopathy history with EF 20-25%     reports that he has been smoking.  He has been smoking about 0.25 packs per  day. He has never used smokeless tobacco. He reports that he does not drink alcohol. His drug history is not on file.  No Known Allergies  Family History  Problem Relation Age of Onset  . Obesity Daughter      Prior to Admission medications   Medication Sig Start Date End Date Taking? Authorizing Provider  albuterol (PROVENTIL) (2.5 MG/3ML) 0.083% nebulizer solution Take 2.5 mg by nebulization every 6 (six) hours as needed for wheezing or shortness of breath.   Yes [provider]  allopurinol (ZYLOPRIM) 100 MG tablet Take 100 mg by mouth daily.   Yes [provider]  amiodarone (PACERONE) 200 MG tablet Take 1 tablet (200 mg total) by mouth daily. 01/26/17  Yes [place], Peter M, MD  aspirin EC 81 MG tablet Take 81 mg by mouth daily.   Yes [provider]  carvedilol (COREG) 6.25 MG tablet Take 1.5 tablets (9.375 mg total) by mouth 2 (two) times daily. 01/26/17  Yes [place], Peter M, MD  diclofenac sodium (VOLTAREN) 1 % GEL Apply 2 g topically 4 (four) times daily as needed (for pain).    Yes [provider]  ferrous sulfate 325 (65 FE) MG tablet Take 325 mg by mouth 3 (three) times daily with meals.   Yes [provider]  furosemide (LASIX) 40 MG tablet Take 40 mg by mouth daily.   Yes [provider]  imiquimod (ALDARA) 5 % cream Apply 1 application topically at bedtime.   Yes [provider]  Ipratropium-Albuterol (COMBIVENT RESPIMAT) 20-100 MCG/ACT AERS respimat Inhale 1 puff into the lungs every 6 (six) hours as needed for wheezing or shortness of breath.   Yes [provider]  nystatin (MYCOSTATIN/NYSTOP) powder Apply 1 g topically 3 (three) times daily as needed (for itching/irritation).    Yes [provider]  SYMBICORT 160-4.5 MCG/ACT inhaler Inhale 2 puffs into the lungs 2 (two) times daily.  10/17/17  Yes [provider]  tiZANidine (ZANAFLEX) 4 MG tablet Take 4 mg by mouth 2 (two) times daily as  needed for muscle spasms.    Yes [provider]  traMADol (ULTRAM) 50 MG tablet Take 50 mg by mouth 2 (two) times daily.    Yes [provider]  sildenafil (REVATIO) 20 MG tablet take 3 to 5 tablets by mouth DAILY AS NEEDED 30 MINUTES BEFORE INTERCOURSE 10/16/17   [provider]    Physical Exam: Vitals:   11/21/17 1959 11/21/17 2001 11/21/17 2228  BP: 127/64  (!) 125/56  Pulse: (!) 50  (!) 42  Resp: 18  16  Temp: 97.8 F (36.6 C)    TempSrc: Oral    SpO2: 99%  99%  Weight:  85.7 kg (189 lb)   Height:  5\' 11"  (1.803 m)       Constitutional: NAD, calm, chronically-ill appearing  Eyes: PERTLA, lids and conjunctivae normal ENMT: Mucous membranes are dry. Posterior pharynx clear of any exudate or  lesions.   Neck: normal, supple, no masses, no thyromegaly Respiratory: Fine rales in bilateral bases, no wheezing. Normal respiratory effort.   Cardiovascular: Rate ~45 and regular. No extremity edema.   Abdomen: No distension, tender erythematous and fluctuant nodule just left of midline, otherwise soft and non-tender. Bowel sounds active. Colostomy bag with scant brown stool.  Musculoskeletal: no clubbing / cyanosis. No joint deformity upper and lower extremities.   Skin: no significant rashes, lesions, ulcers. Poor turgor. Neurologic: CN 2-12 grossly intact. Sensation intact. Strength 5/5 in all 4 limbs.  Psychiatric:  Alert and oriented x 3. Calm, cooperative.     Labs on Admission: I have personally reviewed following labs and imaging studies  CBC: Recent Labs  Lab 11/21/17 2224  WBC 7.5  NEUTROABS 5.8  HGB 17.1*  HCT 52.9*  MCV 81.4  PLT 024*   Basic Metabolic Panel: Recent Labs  Lab 11/21/17 2143  NA 137  K 5.8*  CL 118*  CO2 8*  GLUCOSE 116*  BUN 130*  CREATININE 6.17*  CALCIUM 8.9   GFR: Estimated Creatinine Clearance: 11.4 mL/min (A) (by C-G formula based on SCr of 6.17 mg/dL (H)). Liver Function Tests: No results for input(s):  AST, ALT, ALKPHOS, BILITOT, PROT, ALBUMIN in the last 168 hours. No results for input(s): LIPASE, AMYLASE in the last 168 hours. No results for input(s): AMMONIA in the last 168 hours. Coagulation Profile: No results for input(s): INR, PROTIME in the last 168 hours. Cardiac Enzymes: No results for input(s): CKTOTAL, CKMB, CKMBINDEX, TROPONINI in the last 168 hours. BNP (last 3 results) No results for input(s): PROBNP in the last 8760 hours. HbA1C: No results for input(s): HGBA1C in the last 72 hours. CBG: No results for input(s): GLUCAP in the last 168 hours. Lipid Profile: No results for input(s): CHOL, HDL, LDLCALC, TRIG, CHOLHDL, LDLDIRECT in the last 72 hours. Thyroid Function Tests: No results for input(s): TSH, T4TOTAL, FREET4, T3FREE, THYROIDAB in the last 72 hours. Anemia Panel: No results for input(s): VITAMINB12, FOLATE, FERRITIN, TIBC, IRON, RETICCTPCT in the last 72 hours. Urine analysis:    Component Value Date/Time   COLORURINE YELLOW 11/16/2016 1436   APPEARANCEUR CLEAR 11/16/2016 1436   LABSPEC 1.010 11/16/2016 1436   PHURINE 5.0 11/16/2016 1436   GLUCOSEU NEGATIVE 11/16/2016 1436   HGBUR NEGATIVE 11/16/2016 1436   BILIRUBINUR NEGATIVE 11/16/2016 1436   KETONESUR NEGATIVE 11/16/2016 1436   PROTEINUR NEGATIVE 11/16/2016 1436   NITRITE NEGATIVE 11/16/2016 1436   LEUKOCYTESUR NEGATIVE 11/16/2016 1436   Sepsis Labs: @LABRCNTIP (procalcitonin:4,lacticidven:4) )No results found for this or any previous visit (from the past 240 hour(s)).   Radiological Exams on Admission: Ct Abdomen Pelvis Wo Contrast  Addendum Date: 11/21/2017   ADDENDUM REPORT: 11/21/2017 22:51 ADDENDUM: Dense gallbladder most compatible with gallbladder sludge though, could reflect vicarious excretion contrast. No CT findings of acute cholecystitis. Electronically Signed   By: Elon Alas M.D.   On: 11/21/2017 22:51   Result Date: 11/21/2017 CLINICAL DATA:  Intermittent shortness of breath  for week. LEFT upper quadrant pain. Fell 1 week ago. History of colostomy, peritonitis, diverticulitis, chronic kidney disease. EXAM: CT ABDOMEN AND PELVIS WITHOUT CONTRAST TECHNIQUE: Multidetector CT imaging of the abdomen and pelvis was performed following the standard protocol without IV contrast. COMPARISON:  MRI of the abdomen and pelvis August 16, 2017 and CT abdomen and pelvis June 07, 2016 FINDINGS: LOWER CHEST: Dependent atelectasis. Punctate calcified granuloma. The heart is moderately enlarged. Mild coronary artery calcification. No pericardial effusion. HEPATOBILIARY: Dense  gallbladder.  Negative noncontrast CT liver. PANCREAS: Normal. SPLEEN: Normal. ADRENALS/URINARY TRACT: Kidneys are orthotopic, demonstrating normal size and morphology. No nephrolithiasis, hydronephrosis; limited assessment for renal masses on this nonenhanced examination. Multiple complex renal masses. 5.1 cm LEFT interpolar cyst. The unopacified ureters are normal in course and caliber. Urinary bladder is partially distended and unremarkable. 2.3 cm LEFT adrenal mass previously characterized as benign adenoma. STOMACH/BOWEL: Status post colectomy. RIGHT lower quadrant ostomy with multiple loops of small bowel. Mild wide necked parastomal hernia containing small bowel. Similar patulous surgical bowel anastomosis. Bowel is overall normal in course and caliber. VASCULAR/LYMPHATIC: Aortoiliac vessels are normal in course and caliber. Severe calcific atherosclerosis. No lymphadenopathy by CT size criteria. REPRODUCTIVE: Partially imaged RIGHT scrotal mass, possible testicle within the inguinal canal. OTHER: No intraperitoneal free fluid or free air. MUSCULOSKELETAL: New LEFT paraumbilical 2.5 x 2.7 cm fluid collection. Anterior abdominal wall scarring. Severe degenerative change of bilateral hips. Multilevel advanced facet arthropathy. IMPRESSION: 1. New 2.5 x 2.7 cm paraumbilical anterior abdominal wall fluid collection, given  history of trauma, this could reflect hematoma or seroma. 2. Partially imaged complex RIGHT scrotal mass, incompletely evaluated. Recommend correlation with clinical examination. 3. Bilateral complex renal cysts as characterized on prior MRI. Aortic Atherosclerosis (ICD10-I70.0). Electronically Signed: By: Elon Alas M.D. On: 11/21/2017 21:41   Dg Chest Port 1 View  Result Date: 11/22/2017 CLINICAL DATA:  Dyspnea times 48 hours EXAM: PORTABLE CHEST 1 VIEW COMPARISON:  11/16/2016 FINDINGS: Stable cardiomegaly with aortic atherosclerosis. Minimal interstitial edema and left basilar atelectasis. No effusion or pneumothorax. Osteoarthritis of the [facility] and glenohumeral joints with high-riding humeral heads compatible with chronic rotator cuff tears. IMPRESSION: Cardiomegaly with mild vascular congestion and interstitial edema. Electronically Signed   By: Ashley Royalty M.D.   On: 11/22/2017 01:09    EKG: Independently reviewed. Sinus or ectopic atrial bradycardia, non-specific IVCD, LVH.   Assessment/Plan  1. Acute kidney injury superimposed on CKD stage IV; hyperkalemia; metabolic acidosis   - Presents for evaluation of severe abdominal wall pain at site of fluid-collection addressed below, and he is found to have acute on chronic renal failure  - BUN 130 and Cr 6.17 on admission, up from 44 & 2.42 one year ago  - There is no urinary obstruction on CT abd/pelvis  - Serum potassium is elevated to 5.8 with a bradyarrhythmia o EKG, and serum bicarb is 8  - He reports very poor appetite and family notes he barely eats or drinks; this may be a prerenal azotemia  - Check urine chemistries, start isotonic bicarbonate infusion, avoid nephrotoxins, renally-dose medications, repeat chem panel in am    2. Abdominal fluid collection  - Presents for evaluation of severe abdominal wall pain where there is an erythematous and fluctuant nodule  - CT reveals 2.5 x 2.7 cm fluid collection suggestive of hematoma vs  seroma  - Surgery is consulting and much appreciated, will follow-up on recommendations  - Continue pain-control   3. Chronic systolic CHF   - He appears hypovolemic on admission and was given 1 L NS  - Echo from June 2018 with EF 25%, diffuse HK, mild MR, mod-severe LAE  - There is no apparent dyspnea on admission, but rales appreciated in bilateral bases, will check CXR  - Hold diuretics, continue IVF hydration, follow daily wt and I/O's    4. COPD  - No wheezing or dyspnea on admission  - Continue ICS/LABA and prn Combivent    5. Right scrotal mass; complex  kidney cysts bilaterally  - He will continue urology follow-up     DVT prophylaxis: SCD's  Code Status: Full  Family Communication: Family updated at bedside Consults called: Surgery  Admission status: Inpatient    Vianne Bulls, MD [facility] Pager [phone]  If 7PM-7AM, please contact night-coverage [email or web address] Password [email or web address]  11/22/2017, 1:54 AM

## 2017-11-22 NOTE — Progress Notes (Signed)
[facility] PROGRESS NOTE    Progress Note  [name]  BJS:[id number] DOB: [birth year] DOA: 11/21/2017 PCP: [name], MD     Brief Narrative:   [name] is an 72 y.o. male past medical history of COPD, chronic systolic heart failure with an EF 85% with diffuse hypokinesia on 02/08/2017, tonic kidney disease stage IV with a baseline creatinine of 2.3-2.9, and a history of scrotal mass followed by urology comes into the emergency room with severe abdominal pain and a red lump in the mid abdominal wall, CT scan of the abdomen and pelvis revealed 2.5 x 2.7 fluid collection adjacent to the umbilicus compatible with a seroma or hematoma  Assessment/Plan:   Acute renal failure superimposed on stage 4 chronic kidney disease (HCC) Baseline creatinine 2.3-2.9, he is making urine. He relates a decrease oral intake, urinary sodium is less than 10 creatinine is improving with IV fluid hydration will continue gentle hydration recheck a basic metabolic panel in the morning.  Abdominal wall fluid collection: CT scan as above, surgery was consulted who feels this is a organized hematoma would recommend conservative management hold anticoagulation. And that once his renal function has stabilized could consider ultrasound-guided aspiration versus I&D if symptomatically worsens.  Chronic systolic heart failure: On admission.  The admitting MD relates he was  Hypovolemic, he was fluid resuscitated with a liter of normal saline. Continue to hold diuretics and continue IV fluid hydration. Continue strict I's and O's.  COPD non-oxygen dependent: Continue inhalers seems to be stable.  Right scrotal max/complex bilateral kidney cyst: Follow up with urology as an outpatient.  Non-Anion gap metabolic acidosis: Likely due to renal disease he was placed on sterile with bicarbonate and his bicarbonate this morning is 26 we will discontinue bicarbonate infusion and start him on normal  saline.  Hyperkalemia Resolved with bicarbonate administration, will DC bicarbonate infusion. Check a basic metabolic panel in the morning.  Chronic Thrombocytopenia:    DVT prophylaxis: SCD Family Communication:none Disposition Plan/Barrier to D/C: home in 2 days, once Cr improved Code Status:     Code Status Orders  (From admission, onward)        Start     Ordered   11/22/17 0130  Full code  Continuous     11/22/17 0132    Code Status History    Date Active Date Inactive Code Status Order ID Comments User Context   11/16/2016 1758 11/20/2016 1933 Full Code [id number]  Caren Griffins, MD Inpatient   06/07/2016 1845 06/15/2016 1621 Full Code [id number]  Corey Harold, NP ED        IV Access:    Peripheral IV   Procedures and diagnostic studies:   Ct Abdomen Pelvis Wo Contrast  Addendum Date: 11/21/2017   ADDENDUM REPORT: 11/21/2017 22:51 ADDENDUM: Dense gallbladder most compatible with gallbladder sludge though, could reflect vicarious excretion contrast. No CT findings of acute cholecystitis. Electronically Signed   By: Elon Alas M.D.   On: 11/21/2017 22:51   Result Date: 11/21/2017 CLINICAL DATA:  Intermittent shortness of breath for week. LEFT upper quadrant pain. Fell 1 week ago. History of colostomy, peritonitis, diverticulitis, chronic kidney disease. EXAM: CT ABDOMEN AND PELVIS WITHOUT CONTRAST TECHNIQUE: Multidetector CT imaging of the abdomen and pelvis was performed following the standard protocol without IV contrast. COMPARISON:  MRI of the abdomen and pelvis August 16, 2017 and CT abdomen and pelvis June 07, 2016 FINDINGS: LOWER CHEST: Dependent atelectasis. Punctate calcified granuloma. The heart is  moderately enlarged. Mild coronary artery calcification. No pericardial effusion. HEPATOBILIARY: Dense gallbladder.  Negative noncontrast CT liver. PANCREAS: Normal. SPLEEN: Normal. ADRENALS/URINARY TRACT: Kidneys are orthotopic, demonstrating normal size  and morphology. No nephrolithiasis, hydronephrosis; limited assessment for renal masses on this nonenhanced examination. Multiple complex renal masses. 5.1 cm LEFT interpolar cyst. The unopacified ureters are normal in course and caliber. Urinary bladder is partially distended and unremarkable. 2.3 cm LEFT adrenal mass previously characterized as benign adenoma. STOMACH/BOWEL: Status post colectomy. RIGHT lower quadrant ostomy with multiple loops of small bowel. Mild wide necked parastomal hernia containing small bowel. Similar patulous surgical bowel anastomosis. Bowel is overall normal in course and caliber. VASCULAR/LYMPHATIC: Aortoiliac vessels are normal in course and caliber. Severe calcific atherosclerosis. No lymphadenopathy by CT size criteria. REPRODUCTIVE: Partially imaged RIGHT scrotal mass, possible testicle within the inguinal canal. OTHER: No intraperitoneal free fluid or free air. MUSCULOSKELETAL: New LEFT paraumbilical 2.5 x 2.7 cm fluid collection. Anterior abdominal wall scarring. Severe degenerative change of bilateral hips. Multilevel advanced facet arthropathy. IMPRESSION: 1. New 2.5 x 2.7 cm paraumbilical anterior abdominal wall fluid collection, given history of trauma, this could reflect hematoma or seroma. 2. Partially imaged complex RIGHT scrotal mass, incompletely evaluated. Recommend correlation with clinical examination. 3. Bilateral complex renal cysts as characterized on prior MRI. Aortic Atherosclerosis (ICD10-I70.0). Electronically Signed: By: Elon Alas M.D. On: 11/21/2017 21:41   Dg Chest Port 1 View  Result Date: 11/22/2017 CLINICAL DATA:  Dyspnea times 48 hours EXAM: PORTABLE CHEST 1 VIEW COMPARISON:  11/16/2016 FINDINGS: Stable cardiomegaly with aortic atherosclerosis. Minimal interstitial edema and left basilar atelectasis. No effusion or pneumothorax. Osteoarthritis of the [facility] and glenohumeral joints with high-riding humeral heads compatible with chronic rotator  cuff tears. IMPRESSION: Cardiomegaly with mild vascular congestion and interstitial edema. Electronically Signed   By: Ashley Royalty M.D.   On: 11/22/2017 01:09     Medical Consultants:    None.  Anti-Infectives:   None  Subjective:    Bobbye Reinitz Bloom relates he continues to have abdominal pain.  Objective:    Vitals:   11/21/17 2001 11/21/17 2228 11/22/17 0207 11/22/17 0230  BP:  (!) 125/56 105/70 109/60  Pulse:  (!) 42 (!) 57 (!) 49  Resp:  16 12 (!) 21  Temp:      TempSrc:      SpO2:  99% 95% 96%  Weight: 85.7 kg (189 lb)     Height: 5\' 11"  (1.803 m)       Intake/Output Summary (Last 24 hours) at 11/22/2017 1141 Last data filed at 11/22/2017 1056 Gross per 24 hour  Intake 1003 ml  Output -  Net 1003 ml   Filed Weights   11/21/17 2001  Weight: 85.7 kg (189 lb)    Exam: General exam: In no acute distress. Respiratory system: Good air movement and clear to auscultation. Cardiovascular system: S1 & S2 heard, RRR. Gastrointestinal system: Abdomen is nondistended, soft and nontender, with a colostomy bag in place Central nervous system: Alert and oriented. No focal neurological deficits. Extremities: No pedal edema. Skin: No rashes, lesions or ulcers Psychiatry: Judgement and insight appear normal. Mood & affect appropriate.    Data Reviewed:    Labs: Basic Metabolic Panel: Recent Labs  Lab 11/21/17 2143 11/22/17 0129 11/22/17 0529  NA 137  --  141  K 5.8* 5.8* 4.5  CL 118*  --  107  CO2 8*  --  26  GLUCOSE 116*  --  95  BUN 130*  --  113*  CREATININE 6.17*  --  5.10*  CALCIUM 8.9  --  7.4*   GFR Estimated Creatinine Clearance: 13.7 mL/min (A) (by C-G formula based on SCr of 5.1 mg/dL (H)). Liver Function Tests: No results for input(s): AST, ALT, ALKPHOS, BILITOT, PROT, ALBUMIN in the last 168 hours. No results for input(s): LIPASE, AMYLASE in the last 168 hours. No results for input(s): AMMONIA in the last 168 hours. Coagulation  profile No results for input(s): INR, PROTIME in the last 168 hours.  CBC: Recent Labs  Lab 11/21/17 2224 11/22/17 0529  WBC 7.5 6.2  NEUTROABS 5.8  --   HGB 17.1* 13.6  HCT 52.9* 41.9  MCV 81.4 79.8  PLT 111* 86*   Cardiac Enzymes: No results for input(s): CKTOTAL, CKMB, CKMBINDEX, TROPONINI in the last 168 hours. BNP (last 3 results) No results for input(s): PROBNP in the last 8760 hours. CBG: No results for input(s): GLUCAP in the last 168 hours. D-Dimer: No results for input(s): DDIMER in the last 72 hours. Hgb A1c: No results for input(s): HGBA1C in the last 72 hours. Lipid Profile: No results for input(s): CHOL, HDL, LDLCALC, TRIG, CHOLHDL, LDLDIRECT in the last 72 hours. Thyroid function studies: No results for input(s): TSH, T4TOTAL, T3FREE, THYROIDAB in the last 72 hours.  Invalid input(s): FREET3 Anemia work up: No results for input(s): VITAMINB12, FOLATE, FERRITIN, TIBC, IRON, RETICCTPCT in the last 72 hours. Sepsis Labs: Recent Labs  Lab 11/21/17 2224 11/22/17 0529  WBC 7.5 6.2   Microbiology No results found for this or any previous visit (from the past 240 hour(s)).   Medications:   . mometasone-formoterol  2 puff Inhalation BID  . sodium chloride flush  3 mL Intravenous Q12H   Continuous Infusions: .  sodium bicarbonate (isotonic) infusion in sterile water 75 mL/hr at 11/22/17 0211      LOS: 0 days   Charlynne Cousins  [facility] Pager [phone]  *Please refer to [email or web address], password [email or web address] to get updated schedule on who will round on this patient, as hospitalists switch teams weekly. If 7PM-7AM, please contact night-coverage at [email or web address], password [email or web address] for any overnight needs.  11/22/2017, 11:41 AM

## 2017-11-22 NOTE — ED Notes (Signed)
Colostomy bag changed

## 2017-11-23 LAB — BASIC METABOLIC PANEL
Anion gap: 8 (ref 5–15)
BUN: 92 mg/dL — ABNORMAL HIGH (ref 6–20)
CO2: 14 mmol/L — ABNORMAL LOW (ref 22–32)
CREATININE: 3.85 mg/dL — AB (ref 0.61–1.24)
Calcium: 8.3 mg/dL — ABNORMAL LOW (ref 8.9–10.3)
Chloride: 119 mmol/L — ABNORMAL HIGH (ref 101–111)
GFR, EST AFRICAN AMERICAN: 16 mL/min — AB (ref >60)
GFR, EST NON AFRICAN AMERICAN: 14 mL/min — AB (ref >60)
Glucose, Bld: 112 mg/dL — ABNORMAL HIGH (ref 65–99)
Potassium: 4.5 mmol/L (ref 3.5–5.1)
SODIUM: 141 mmol/L (ref 135–145)

## 2017-11-23 LAB — CBC
HCT: 43.6 % (ref 39.0–52.0)
Hemoglobin: 14 g/dL (ref 13.0–17.0)
MCH: 25.8 pg — ABNORMAL LOW (ref 26.0–34.0)
MCHC: 32.1 g/dL (ref 30.0–36.0)
MCV: 80.3 fL (ref 78.0–100.0)
PLATELETS: 93 10*3/uL — AB (ref 150–400)
RBC: 5.43 MIL/uL (ref 4.22–5.81)
RDW: 21.1 % — ABNORMAL HIGH (ref 11.5–15.5)
WBC: 6.3 10*3/uL (ref 4.0–10.5)

## 2017-11-23 LAB — UREA NITROGEN, URINE: Urea Nitrogen, Ur: 1136 mg/dL

## 2017-11-23 LAB — POTASSIUM
Potassium: 4.8 mmol/L (ref 3.5–5.1)
Potassium: 4.8 mmol/L (ref 3.5–5.1)

## 2017-11-23 LAB — GLUCOSE, CAPILLARY: Glucose-Capillary: 95 mg/dL (ref 65–99)

## 2017-11-23 IMAGING — CR DG CHEST 1V PORT
1 series · 1 of 1 positions shown · non-contrast
Comparison: 06/12/2016

CLINICAL DATA: 69-year-old male with shortness of breath.
Subsequent encounter.

EXAM:
PORTABLE CHEST 1 VIEW

[AP]
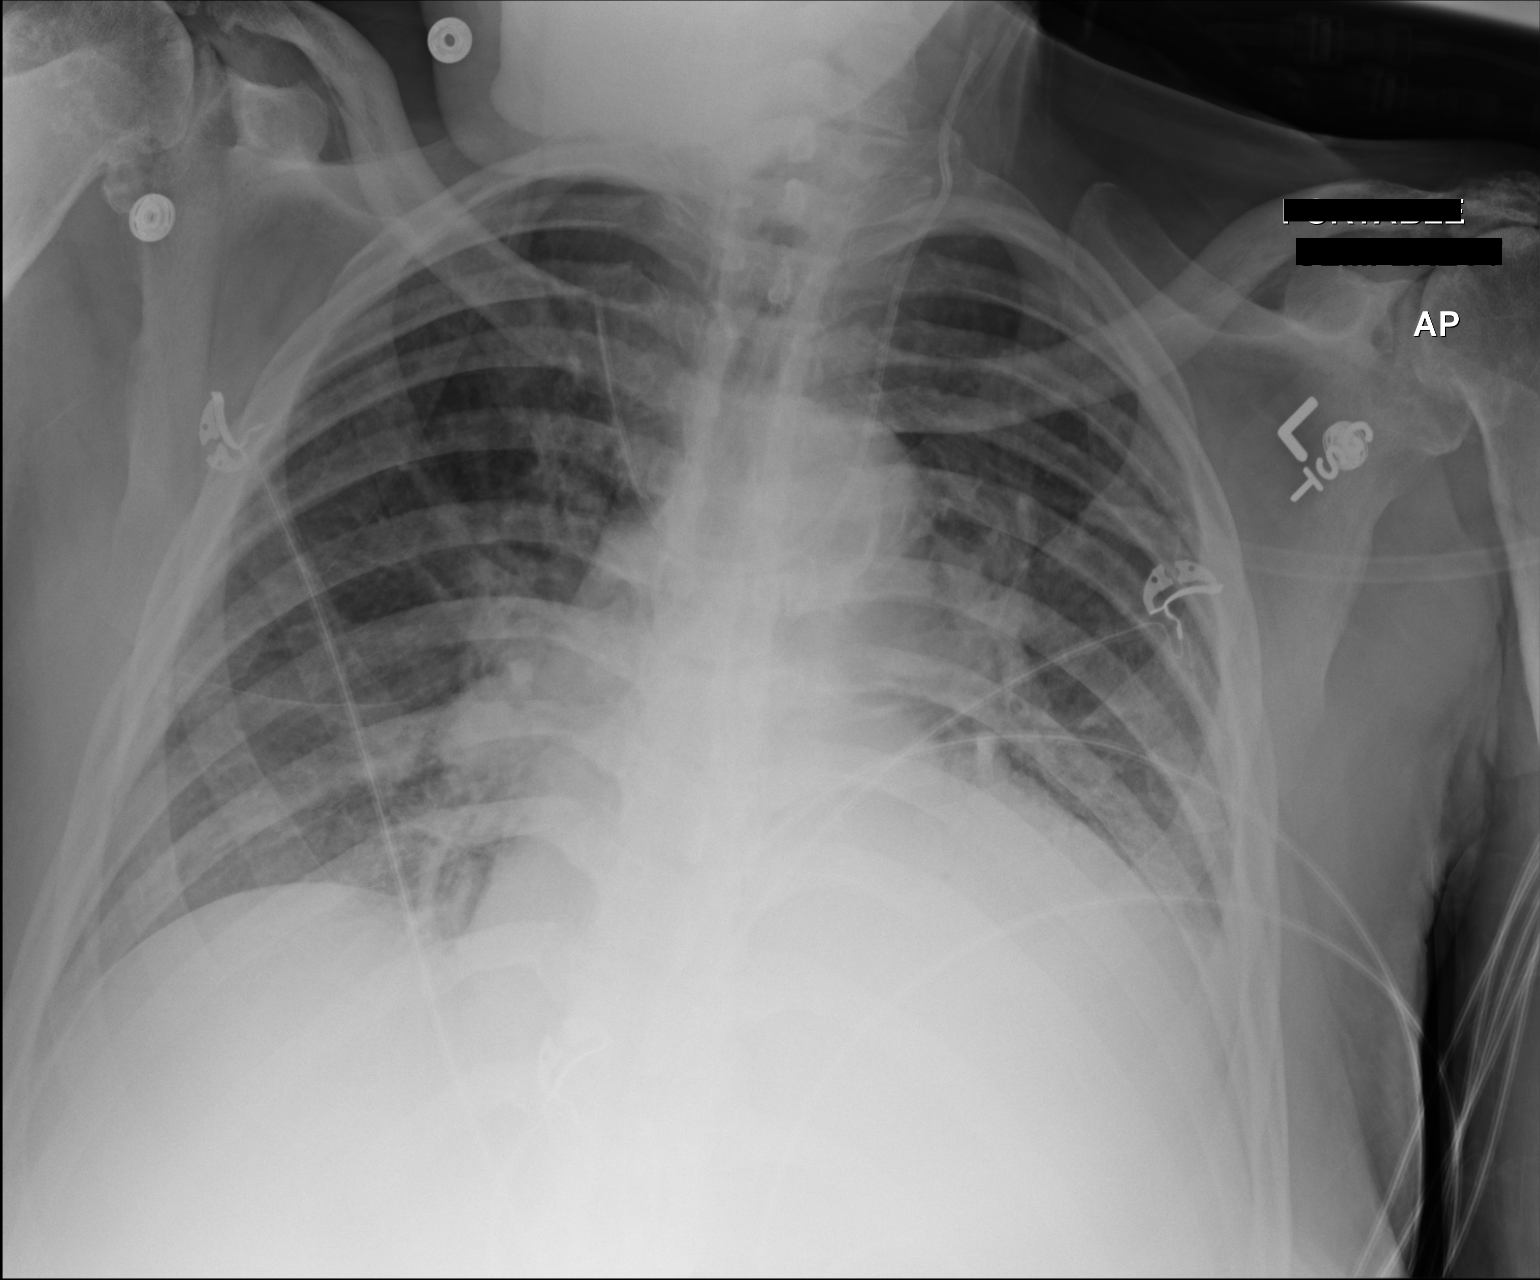

[1 of 1 positions shown; findings below may reference images not displayed]

FINDINGS: Left central line crosses midline with tip directed superiorly. Tip
may be within the right internal jugular vein/innominate vein.

Pulmonary vascular congestion.  Findings most notable centrally.

Left base consolidation may represent atelectasis, infiltrate and/
or pleural effusion. Follow-up until clearance recommended.

Cardiomegaly.

Calcified mildly tortuous aorta.

Bilateral shoulder joint degenerative changes.
IMPRESSION: Left central line crosses midline with tip directed superiorly. Tip
may be within the right internal jugular vein/innominate vein.

Pulmonary vascular congestion most notable centrally minimally
changed from prior exam.

Persistent consolidation left base.

Cardiomegaly.

Calcified mildly tortuous aorta.

## 2017-11-23 MED ORDER — SODIUM BICARBONATE 650 MG PO TABS
650.0000 mg | ORAL_TABLET | Freq: Three times a day (TID) | ORAL | Status: DC
  Administered 2017-11-23 – 2017-11-24 (×4): 650 mg via ORAL
  Filled 2017-11-23 (×4): qty 1

## 2017-11-23 NOTE — Progress Notes (Signed)
[facility] PROGRESS NOTE    Progress Note  [name]  WCH:[id number] DOB: April 06, 1946 DOA: 11/21/2017 PCP: [name], MD     Brief Narrative:   [name] is an 72 y.o. male past medical history of COPD, chronic systolic heart failure with an EF 85% with diffuse hypokinesia on 02/08/2017, tonic kidney disease stage IV with a baseline creatinine of 2.3-2.9, and a history of scrotal mass followed by urology comes into the emergency room with severe abdominal pain and a red lump in the mid abdominal wall, CT scan of the abdomen and pelvis revealed 2.5 x 2.7 fluid collection adjacent to the umbilicus compatible with a seroma or hematoma  Assessment/Plan:   Acute renal failure superimposed on stage 4 chronic kidney disease (HCC) Baseline creatinine 2.3-2.9, he is making urine. Creatinine is improving significantly with IV fluid hydration will continue IV fluids for 12 additional hours check a basic metabolic panel in the morning.  Abdominal wall fluid collection: CT scan as above, surgery was consulted who feels this is a organized hematoma would recommend conservative management hold anticoagulation. Surgery was consulted recommended no further surgical intervention.  Chronic systolic heart failure: On admission.  Continue to hold diuretics. Continue strict I's and O's.  COPD non-oxygen dependent: Continue inhalers seems to be stable.  Right scrotal max/complex bilateral kidney cyst: Follow up with urology as an outpatient.  Non-Anion gap metabolic acidosis: Likely due to renal disease we will start him on oral bicarbonate check a basic metabolic panel in the morning.  Hyperkalemia Likely due to acidosis now resolved check a basic metabolic panel in the morning.  Chronic Thrombocytopenia: Stable.   DVT prophylaxis: SCD Family Communication:none Disposition Plan/Barrier to D/C: home in 1 days, once Cr improved Code Status:     Code Status Orders    (From admission, onward)        Start     Ordered   11/22/17 0130  Full code  Continuous     11/22/17 0132    Code Status History    Date Active Date Inactive Code Status Order ID Comments User Context   11/16/2016 1758 11/20/2016 1933 Full Code [id number]  Caren Griffins, MD Inpatient   06/07/2016 1845 06/15/2016 1621 Full Code [id number]  Corey Harold, NP ED        IV Access:    Peripheral IV   Procedures and diagnostic studies:   Ct Abdomen Pelvis Wo Contrast  Addendum Date: 11/21/2017   ADDENDUM REPORT: 11/21/2017 22:51 ADDENDUM: Dense gallbladder most compatible with gallbladder sludge though, could reflect vicarious excretion contrast. No CT findings of acute cholecystitis. Electronically Signed   By: Elon Alas M.D.   On: 11/21/2017 22:51   Result Date: 11/21/2017 CLINICAL DATA:  Intermittent shortness of breath for week. LEFT upper quadrant pain. Fell 1 week ago. History of colostomy, peritonitis, diverticulitis, chronic kidney disease. EXAM: CT ABDOMEN AND PELVIS WITHOUT CONTRAST TECHNIQUE: Multidetector CT imaging of the abdomen and pelvis was performed following the standard protocol without IV contrast. COMPARISON:  MRI of the abdomen and pelvis August 16, 2017 and CT abdomen and pelvis June 07, 2016 FINDINGS: LOWER CHEST: Dependent atelectasis. Punctate calcified granuloma. The heart is moderately enlarged. Mild coronary artery calcification. No pericardial effusion. HEPATOBILIARY: Dense gallbladder.  Negative noncontrast CT liver. PANCREAS: Normal. SPLEEN: Normal. ADRENALS/URINARY TRACT: Kidneys are orthotopic, demonstrating normal size and morphology. No nephrolithiasis, hydronephrosis; limited assessment for renal masses on this nonenhanced examination. Multiple complex renal masses. 5.1 cm LEFT  interpolar cyst. The unopacified ureters are normal in course and caliber. Urinary bladder is partially distended and unremarkable. 2.3 cm LEFT adrenal mass previously  characterized as benign adenoma. STOMACH/BOWEL: Status post colectomy. RIGHT lower quadrant ostomy with multiple loops of small bowel. Mild wide necked parastomal hernia containing small bowel. Similar patulous surgical bowel anastomosis. Bowel is overall normal in course and caliber. VASCULAR/LYMPHATIC: Aortoiliac vessels are normal in course and caliber. Severe calcific atherosclerosis. No lymphadenopathy by CT size criteria. REPRODUCTIVE: Partially imaged RIGHT scrotal mass, possible testicle within the inguinal canal. OTHER: No intraperitoneal free fluid or free air. MUSCULOSKELETAL: New LEFT paraumbilical 2.5 x 2.7 cm fluid collection. Anterior abdominal wall scarring. Severe degenerative change of bilateral hips. Multilevel advanced facet arthropathy. IMPRESSION: 1. New 2.5 x 2.7 cm paraumbilical anterior abdominal wall fluid collection, given history of trauma, this could reflect hematoma or seroma. 2. Partially imaged complex RIGHT scrotal mass, incompletely evaluated. Recommend correlation with clinical examination. 3. Bilateral complex renal cysts as characterized on prior MRI. Aortic Atherosclerosis (ICD10-I70.0). Electronically Signed: By: Elon Alas M.D. On: 11/21/2017 21:41   Dg Chest Port 1 View  Result Date: 11/22/2017 CLINICAL DATA:  Dyspnea times 48 hours EXAM: PORTABLE CHEST 1 VIEW COMPARISON:  11/16/2016 FINDINGS: Stable cardiomegaly with aortic atherosclerosis. Minimal interstitial edema and left basilar atelectasis. No effusion or pneumothorax. Osteoarthritis of the [facility] and glenohumeral joints with high-riding humeral heads compatible with chronic rotator cuff tears. IMPRESSION: Cardiomegaly with mild vascular congestion and interstitial edema. Electronically Signed   By: Ashley Royalty M.D.   On: 11/22/2017 01:09     Medical Consultants:    None.  Anti-Infectives:   None  Subjective:    [name] no further abdominal pain laying comfortably in bed, when I went  in the room he was sleeping.  Objective:    Vitals:   11/22/17 1500 11/22/17 1530 11/22/17 2040 11/23/17 0524  BP: (!) 112/57 118/72 104/60 103/66  Pulse: 64 64 66 64  Resp: (!) 21 (!) 25 18 20   Temp:   98 F (36.7 C) 98.4 F (36.9 C)  TempSrc:   Oral Oral  SpO2: 95% 94% 97% 96%  Weight:    73.3 kg (161 lb 9.6 oz)  Height:        Intake/Output Summary (Last 24 hours) at 11/23/2017 1240 Last data filed at 11/23/2017 0903 Gross per 24 hour  Intake 1610 ml  Output 475 ml  Net 1135 ml   Filed Weights   11/21/17 2001 11/23/17 0524  Weight: 85.7 kg (189 lb) 73.3 kg (161 lb 9.6 oz)    Exam: General exam: In no acute distress. Respiratory system: Good air movement and clear to auscultation. Cardiovascular system: S1 & S2 heard, RRR. Gastrointestinal system: Abdomen is nondistended, soft and nontender, with a colostomy bag in place Central nervous system: Alert and oriented. No focal neurological deficits. Extremities: No pedal edema. Skin: No rashes, lesions or ulcers Psychiatry: Judgement and insight appear normal. Mood & affect appropriate.    Data Reviewed:    Labs: Basic Metabolic Panel: Recent Labs  Lab 11/21/17 2143  11/22/17 0529  11/23/17 0558 11/23/17 0943  NA 137  --  141  --  141  --   K 5.8*   < > 4.5   < > 4.5 4.8  CL 118*  --  107  --  119*  --   CO2 8*  --  26  --  14*  --   GLUCOSE 116*  --  95  --  112*  --   BUN 130*  --  113*  --  92*  --   CREATININE 6.17*  --  5.10*  --  3.85*  --   CALCIUM 8.9  --  7.4*  --  8.3*  --    < > = values in this interval not displayed.   GFR Estimated Creatinine Clearance: 17.7 mL/min (A) (by C-G formula based on SCr of 3.85 mg/dL (H)). Liver Function Tests: No results for input(s): AST, ALT, ALKPHOS, BILITOT, PROT, ALBUMIN in the last 168 hours. No results for input(s): LIPASE, AMYLASE in the last 168 hours. No results for input(s): AMMONIA in the last 168 hours. Coagulation profile No results for  input(s): INR, PROTIME in the last 168 hours.  CBC: Recent Labs  Lab 11/21/17 2224 11/22/17 0529 11/23/17 0558  WBC 7.5 6.2 6.3  NEUTROABS 5.8  --   --   HGB 17.1* 13.6 14.0  HCT 52.9* 41.9 43.6  MCV 81.4 79.8 80.3  PLT 111* 86* 93*   Cardiac Enzymes: No results for input(s): CKTOTAL, CKMB, CKMBINDEX, TROPONINI in the last 168 hours. BNP (last 3 results) No results for input(s): PROBNP in the last 8760 hours. CBG: Recent Labs  Lab 11/22/17 1234 11/23/17 0905  GLUCAP 110* 95   D-Dimer: No results for input(s): DDIMER in the last 72 hours. Hgb A1c: No results for input(s): HGBA1C in the last 72 hours. Lipid Profile: No results for input(s): CHOL, HDL, LDLCALC, TRIG, CHOLHDL, LDLDIRECT in the last 72 hours. Thyroid function studies: No results for input(s): TSH, T4TOTAL, T3FREE, THYROIDAB in the last 72 hours.  Invalid input(s): FREET3 Anemia work up: No results for input(s): VITAMINB12, FOLATE, FERRITIN, TIBC, IRON, RETICCTPCT in the last 72 hours. Sepsis Labs: Recent Labs  Lab 11/21/17 2224 11/22/17 0529 11/23/17 0558  WBC 7.5 6.2 6.3   Microbiology No results found for this or any previous visit (from the past 240 hour(s)).   Medications:   . mometasone-formoterol  2 puff Inhalation BID  . sodium bicarbonate  650 mg Oral TID  . sodium chloride flush  3 mL Intravenous Q12H   Continuous Infusions:     LOS: 1 day   [facility] Pager [phone]  *Please refer to [email or web address], password [email or web address] to get updated schedule on who will round on this patient, as hospitalists switch teams weekly. If 7PM-7AM, please contact night-coverage at [email or web address], password [email or web address] for any overnight needs.  11/23/2017, 12:40 PM

## 2017-11-24 LAB — BASIC METABOLIC PANEL
Anion gap: 8 (ref 5–15)
BUN: 74 mg/dL — AB (ref 6–20)
CHLORIDE: 117 mmol/L — AB (ref 101–111)
CO2: 14 mmol/L — AB (ref 22–32)
CREATININE: 3.06 mg/dL — AB (ref 0.61–1.24)
Calcium: 8.4 mg/dL — ABNORMAL LOW (ref 8.9–10.3)
GFR calc non Af Amer: 19 mL/min — ABNORMAL LOW (ref >60)
GFR, EST AFRICAN AMERICAN: 22 mL/min — AB (ref >60)
Glucose, Bld: 106 mg/dL — ABNORMAL HIGH (ref 65–99)
Potassium: 4.8 mmol/L (ref 3.5–5.1)
SODIUM: 139 mmol/L (ref 135–145)

## 2017-11-24 LAB — GLUCOSE, CAPILLARY: Glucose-Capillary: 103 mg/dL — ABNORMAL HIGH (ref 65–99)

## 2017-11-24 MED ORDER — SODIUM BICARBONATE 650 MG PO TABS: 650.0000 mg | ORAL_TABLET | Freq: Two times a day (BID) | ORAL | 1 refills | Status: AC

## 2017-11-24 NOTE — Discharge Summary (Signed)
Physician Discharge Summary  [name]:[id number] DOB: Apr 12, 1946 DOA: 11/21/2017  PCP: [name], MD  Admit date: 11/21/2017 Discharge date: 11/24/2017  Admitted From: home Disposition:  Home  Recommendations for Outpatient Follow-up:  1. Follow up with PCP in 1-2 weeks 2. Please obtain BMP/CBC in one week 3. Follow-up with nephrology in 4 weeks check a basic metabolic panel to follow-up on his bicarbonate and creatinine.   Home Health:No Equipment/Devices:none  Discharge Condition:stable CODE STATUS:full Diet recommendation: Heart Healthy   Brief/Interim Summary: 72 y.o. male past medical history of COPD, chronic systolic heart failure with an EF 85% with diffuse hypokinesia on 02/08/2017, tonic kidney disease stage IV with a baseline creatinine of 2.3-2.9, and a history of scrotal mass followed by urology comes into the emergency room with severe abdominal pain and a red lump in the mid abdominal wall  Discharge Diagnoses:  Principal Problem:   Acute renal failure superimposed on stage 4 chronic kidney disease ([place]) Active Problems:   Hyperkalemia   Scrotal mass   Abdominal fluid collection   Chronic systolic CHF (congestive heart failure) (HCC)   COPD with chronic bronchitis (HCC)   AKI (acute kidney injury) ([place])  Acute kidney injury superimposed on chronic kidney disease stage IV: His baseline creatinine is 2.3-2.9. In the ED he was complaining of dizziness, urine lites were not done on admission, he was started on IV fluid hydration and his creatinine improved to baseline.  The etiology is probably prerenal in the setting of decreased oral intakes and diuretic use.  Abdominal wall fluid collection: A CT scan was done with results as below surgery was consulted who recommended conservative management and to hold anticoagulation.  Chronic systolic heart failure: Diuretics will held on admission due to his acute renal failure. He will resume his current  dose of diuretics and heart failure medication as an outpatient no changes were made.  COPD non-oxygen dependent: He remained stable.  Right scrotal mass complex bilateral kidney cyst: He will follow-up with urology as an outpatient.  Non-anion gap metabolic acidosis: Likely due to renal disease he was started on oral bicarbonate and his bicarbonate improved, he will go home on oral bicarbonate tablets.  He was follow-up with PCP as an outpatient and nephrology and check a be made at this time.  Hyperkalemia: Resolved likely due to acidosis.  Chronic thrombocytopenia remained stable. Discharge Instructions  Discharge Instructions    Diet - low sodium heart healthy   Complete by:  As directed    Increase activity slowly   Complete by:  As directed      Allergies as of 11/24/2017   No Known Allergies     Medication List    TAKE these medications   albuterol (2.5 MG/3ML) 0.083% nebulizer solution Commonly known as:  PROVENTIL Take 2.5 mg by nebulization every 6 (six) hours as needed for wheezing or shortness of breath.   allopurinol 100 MG tablet Commonly known as:  ZYLOPRIM Take 100 mg by mouth daily.   amiodarone 200 MG tablet Commonly known as:  PACERONE Take 1 tablet (200 mg total) by mouth daily.   aspirin EC 81 MG tablet Take 81 mg by mouth daily.   carvedilol 6.25 MG tablet Commonly known as:  COREG Take 1.5 tablets (9.375 mg total) by mouth 2 (two) times daily.   COMBIVENT RESPIMAT 20-100 MCG/ACT Aers respimat Generic drug:  Ipratropium-Albuterol Inhale 1 puff into the lungs every 6 (six) hours as needed for wheezing or shortness of breath.  diclofenac sodium 1 % Gel Commonly known as:  VOLTAREN Apply 2 g topically 4 (four) times daily as needed (for pain).   ferrous sulfate 325 (65 FE) MG tablet Take 325 mg by mouth 3 (three) times daily with meals.   furosemide 40 MG tablet Commonly known as:  LASIX Take 40 mg by mouth daily.   imiquimod 5 %  cream Commonly known as:  ALDARA Apply 1 application topically at bedtime.   nystatin powder Commonly known as:  MYCOSTATIN/NYSTOP Apply 1 g topically 3 (three) times daily as needed (for itching/irritation).   sildenafil 20 MG tablet Commonly known as:  REVATIO take 3 to 5 tablets by mouth DAILY AS NEEDED 30 MINUTES BEFORE INTERCOURSE   sodium bicarbonate 650 MG tablet Take 1 tablet (650 mg total) by mouth 2 (two) times daily.   SYMBICORT 160-4.5 MCG/ACT inhaler Generic drug:  budesonide-formoterol Inhale 2 puffs into the lungs 2 (two) times daily.   tiZANidine 4 MG tablet Commonly known as:  ZANAFLEX Take 4 mg by mouth 2 (two) times daily as needed for muscle spasms.   traMADol 50 MG tablet Commonly known as:  ULTRAM Take 50 mg by mouth 2 (two) times daily.       No Known Allergies  Consultations:  None   Procedures/Studies: Ct Abdomen Pelvis Wo Contrast  Addendum Date: 11/21/2017   ADDENDUM REPORT: 11/21/2017 22:51 ADDENDUM: Dense gallbladder most compatible with gallbladder sludge though, could reflect vicarious excretion contrast. No CT findings of acute cholecystitis. Electronically Signed   By: Elon Alas M.D.   On: 11/21/2017 22:51   Result Date: 11/21/2017 CLINICAL DATA:  Intermittent shortness of breath for week. LEFT upper quadrant pain. Fell 1 week ago. History of colostomy, peritonitis, diverticulitis, chronic kidney disease. EXAM: CT ABDOMEN AND PELVIS WITHOUT CONTRAST TECHNIQUE: Multidetector CT imaging of the abdomen and pelvis was performed following the standard protocol without IV contrast. COMPARISON:  MRI of the abdomen and pelvis August 16, 2017 and CT abdomen and pelvis June 07, 2016 FINDINGS: LOWER CHEST: Dependent atelectasis. Punctate calcified granuloma. The heart is moderately enlarged. Mild coronary artery calcification. No pericardial effusion. HEPATOBILIARY: Dense gallbladder.  Negative noncontrast CT liver. PANCREAS: Normal. SPLEEN:  Normal. ADRENALS/URINARY TRACT: Kidneys are orthotopic, demonstrating normal size and morphology. No nephrolithiasis, hydronephrosis; limited assessment for renal masses on this nonenhanced examination. Multiple complex renal masses. 5.1 cm LEFT interpolar cyst. The unopacified ureters are normal in course and caliber. Urinary bladder is partially distended and unremarkable. 2.3 cm LEFT adrenal mass previously characterized as benign adenoma. STOMACH/BOWEL: Status post colectomy. RIGHT lower quadrant ostomy with multiple loops of small bowel. Mild wide necked parastomal hernia containing small bowel. Similar patulous surgical bowel anastomosis. Bowel is overall normal in course and caliber. VASCULAR/LYMPHATIC: Aortoiliac vessels are normal in course and caliber. Severe calcific atherosclerosis. No lymphadenopathy by CT size criteria. REPRODUCTIVE: Partially imaged RIGHT scrotal mass, possible testicle within the inguinal canal. OTHER: No intraperitoneal free fluid or free air. MUSCULOSKELETAL: New LEFT paraumbilical 2.5 x 2.7 cm fluid collection. Anterior abdominal wall scarring. Severe degenerative change of bilateral hips. Multilevel advanced facet arthropathy. IMPRESSION: 1. New 2.5 x 2.7 cm paraumbilical anterior abdominal wall fluid collection, given history of trauma, this could reflect hematoma or seroma. 2. Partially imaged complex RIGHT scrotal mass, incompletely evaluated. Recommend correlation with clinical examination. 3. Bilateral complex renal cysts as characterized on prior MRI. Aortic Atherosclerosis (ICD10-I70.0). Electronically Signed: By: Elon Alas M.D. On: 11/21/2017 21:41   Dg Chest [facility] 1 [address]  Result Date: 11/22/2017 CLINICAL DATA:  Dyspnea times 48 hours EXAM: PORTABLE CHEST 1 VIEW COMPARISON:  11/16/2016 FINDINGS: Stable cardiomegaly with aortic atherosclerosis. Minimal interstitial edema and left basilar atelectasis. No effusion or pneumothorax. Osteoarthritis of the [facility] and  glenohumeral joints with high-riding humeral heads compatible with chronic rotator cuff tears. IMPRESSION: Cardiomegaly with mild vascular congestion and interstitial edema. Electronically Signed   By: Ashley Royalty M.D.   On: 11/22/2017 01:09    Subjective:  Feels great wants to go home no new complaints. Discharge Exam: Vitals:   11/24/17 0439 11/24/17 0858  BP: 108/62   Pulse: 69   Resp: 18   Temp: 98.3 F (36.8 C)   SpO2: 95% 97%   Vitals:   11/23/17 1958 11/23/17 2059 11/24/17 0439 11/24/17 0858  BP:  (!) 102/56 108/62   Pulse:  67 69   Resp:  20 18   Temp:  97.6 F (36.4 C) 98.3 F (36.8 C)   TempSrc:  Oral Oral   SpO2: 97% 97% 95% 97%  Weight:   75.3 kg (166 lb 0.1 oz)   Height:        General: Pt is alert, awake, not in acute distress Cardiovascular: RRR, S1/S2 +, no rubs, no gallops Respiratory: CTA bilaterally, no wheezing, no rhonchi Abdominal: Soft, NT, ND, bowel sounds + Extremities: no edema, no cyanosis    The results of significant diagnostics from this hospitalization (including imaging, microbiology, ancillary and laboratory) are listed below for reference.     Microbiology: No results found for this or any previous visit (from the past 240 hour(s)).   Labs: BNP (last 3 results) No results for input(s): BNP in the last 8760 hours. Basic Metabolic Panel: Recent Labs  Lab 11/21/17 2143  11/22/17 0529  11/22/17 2112 11/23/17 0130 11/23/17 0558 11/23/17 0943 11/24/17 0535  NA 137  --  141  --   --   --  141  --  139  K 5.8*   < > 4.5   < > 5.0 4.8 4.5 4.8 4.8  CL 118*  --  107  --   --   --  119*  --  117*  CO2 8*  --  26  --   --   --  14*  --  14*  GLUCOSE 116*  --  95  --   --   --  112*  --  106*  BUN 130*  --  113*  --   --   --  92*  --  74*  CREATININE 6.17*  --  5.10*  --   --   --  3.85*  --  3.06*  CALCIUM 8.9  --  7.4*  --   --   --  8.3*  --  8.4*   < > = values in this interval not displayed.   Liver Function Tests: No  results for input(s): AST, ALT, ALKPHOS, BILITOT, PROT, ALBUMIN in the last 168 hours. No results for input(s): LIPASE, AMYLASE in the last 168 hours. No results for input(s): AMMONIA in the last 168 hours. CBC: Recent Labs  Lab 11/21/17 2224 11/22/17 0529 11/23/17 0558  WBC 7.5 6.2 6.3  NEUTROABS 5.8  --   --   HGB 17.1* 13.6 14.0  HCT 52.9* 41.9 43.6  MCV 81.4 79.8 80.3  PLT 111* 86* 93*   Cardiac Enzymes: No results for input(s): CKTOTAL, CKMB, CKMBINDEX, TROPONINI in the last 168 hours. BNP: Invalid input(s): POCBNP CBG: Recent  Labs  Lab 11/22/17 1234 11/23/17 0905 11/24/17 0732  GLUCAP 110* 95 103*   D-Dimer No results for input(s): DDIMER in the last 72 hours. Hgb A1c No results for input(s): HGBA1C in the last 72 hours. Lipid Profile No results for input(s): CHOL, HDL, LDLCALC, TRIG, CHOLHDL, LDLDIRECT in the last 72 hours. Thyroid function studies No results for input(s): TSH, T4TOTAL, T3FREE, THYROIDAB in the last 72 hours.  Invalid input(s): FREET3 Anemia work up No results for input(s): VITAMINB12, FOLATE, FERRITIN, TIBC, IRON, RETICCTPCT in the last 72 hours. Urinalysis    Component Value Date/Time   COLORURINE YELLOW 11/16/2016 1436   APPEARANCEUR CLEAR 11/16/2016 1436   LABSPEC 1.010 11/16/2016 1436   PHURINE 5.0 11/16/2016 1436   GLUCOSEU NEGATIVE 11/16/2016 1436   HGBUR NEGATIVE 11/16/2016 1436   BILIRUBINUR NEGATIVE 11/16/2016 1436   KETONESUR NEGATIVE 11/16/2016 1436   PROTEINUR NEGATIVE 11/16/2016 1436   NITRITE NEGATIVE 11/16/2016 1436   LEUKOCYTESUR NEGATIVE 11/16/2016 1436   Sepsis Labs Invalid input(s): PROCALCITONIN,  WBC,  LACTICIDVEN Microbiology No results found for this or any previous visit (from the past 240 hour(s)).   Time coordinating discharge: 33 minutes  SIGNED:   Charlynne Cousins, MD  [facility] 11/24/2017, 10:37 AM Pager   If 7PM-7AM, please contact night-coverage [email or web address] Password [email or web address]

## 2017-12-10 IMAGING — MR MR ABDOMEN W/O CM
4 of 8 series · 21 of 48 positions shown · non-contrast
Comparison: CT 06/07/2016.  CT 02/11/2008.

CLINICAL DATA: Multiple renal lesions of varying attenuation on
previous CT.

EXAM:
MRI ABDOMEN WITHOUT CONTRAST
TECHNIQUE: Multiplanar multisequence MR imaging was performed without the
administration of intravenous contrast.

[Series 3: DWI b500 · axial · 6.0mm · 1.48mm/px · z∈[-86,+133]mm · 6 of 58 slices shown]
[im 1/58]
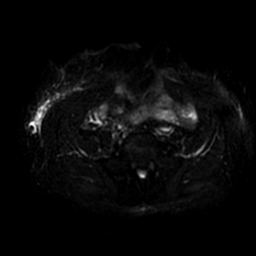
[im 12/58]
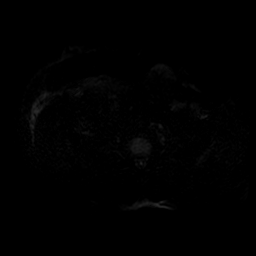
[im 23/58]
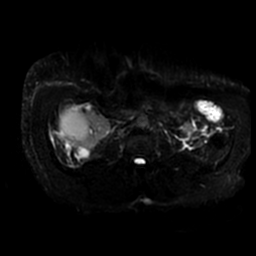
[im 35/58]
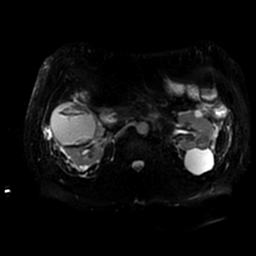
[im 46/58]
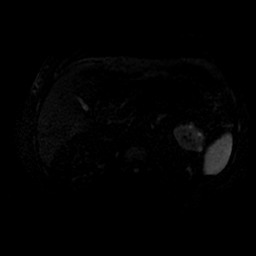
[im 58/58]
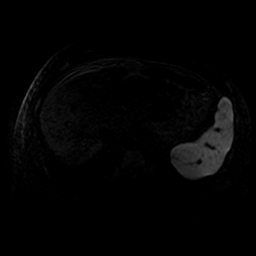

[Series 4: T2 fat-sat · axial · 5.0mm · 0.78mm/px · z∈[-78,+137]mm · 5 of 44 slices shown]
[im 1/44]
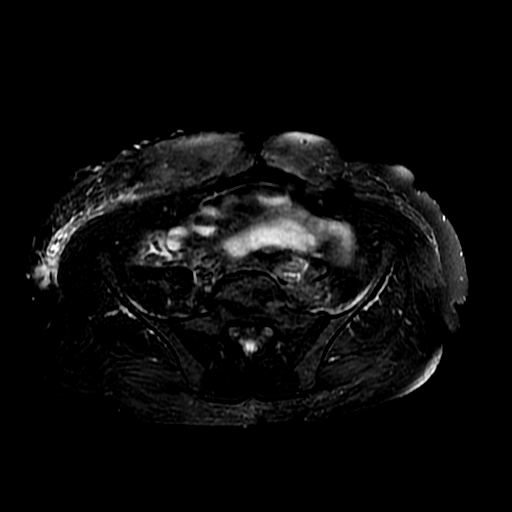
[im 11/44]
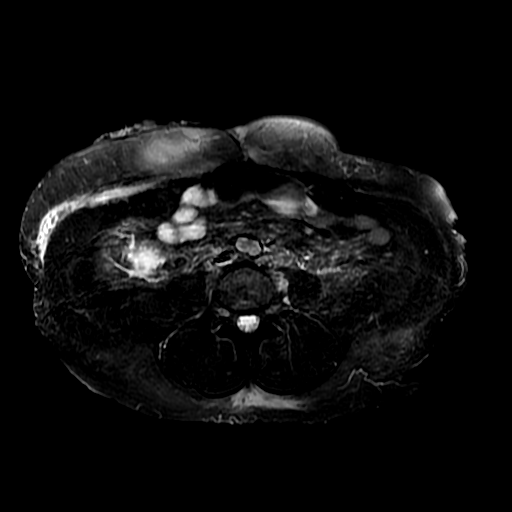
[im 22/44]
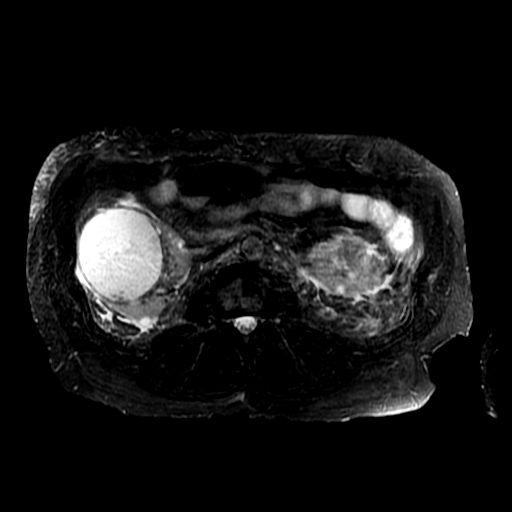
[im 33/44]
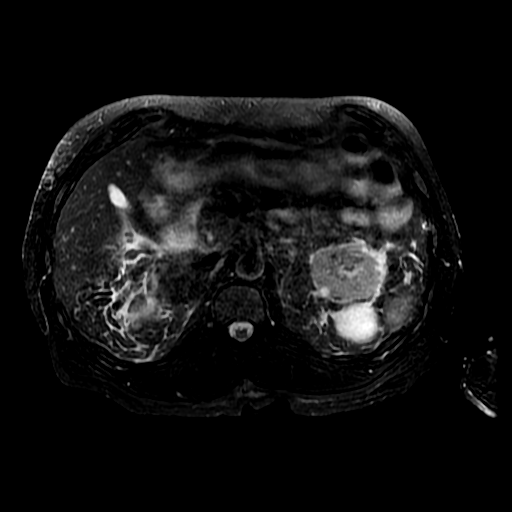
[im 44/44]
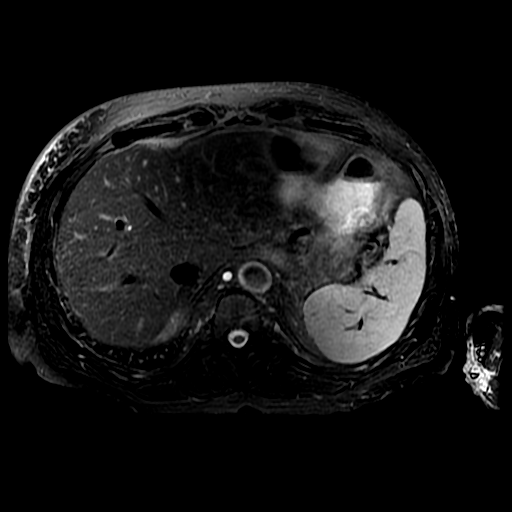

[Series 7: T2 · axial · 5.0mm · 0.78mm/px · z∈[-75,+134]mm · 5 of 43 slices shown (1 of 2)]
[im 1/43]
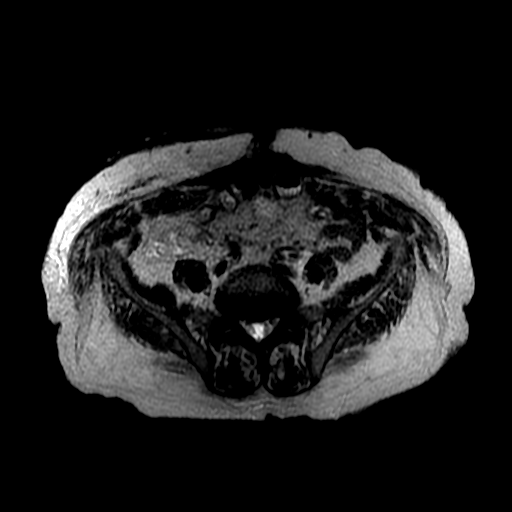
[im 11/43]
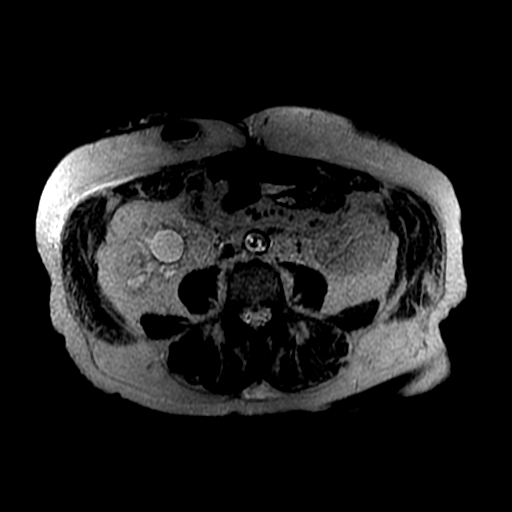
[im 22/43]
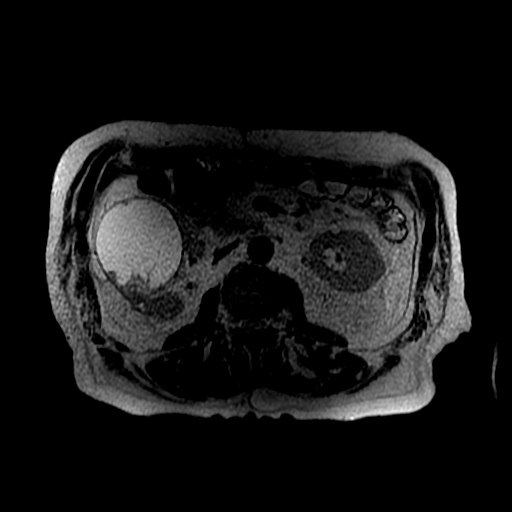
[im 32/43]
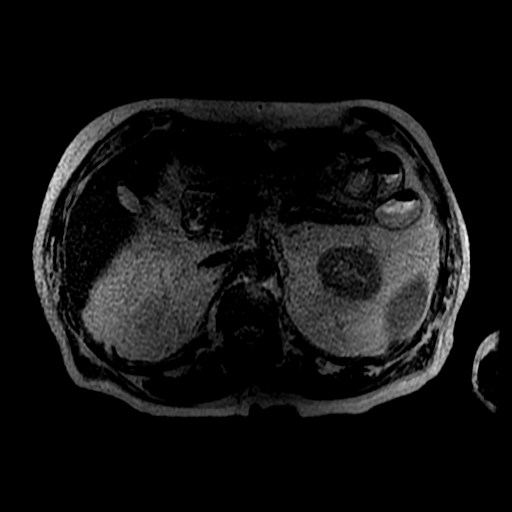
[im 43/43]
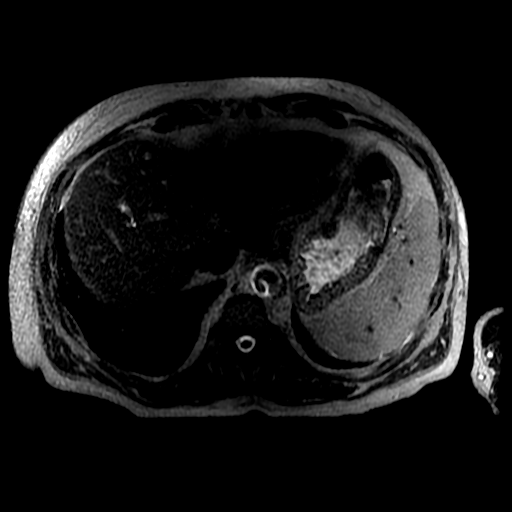

[Series 8: T2 · coronal · 5.0mm · 0.78mm/px · 5 of 45 slices shown (2 of 2)]
[im 1/45]
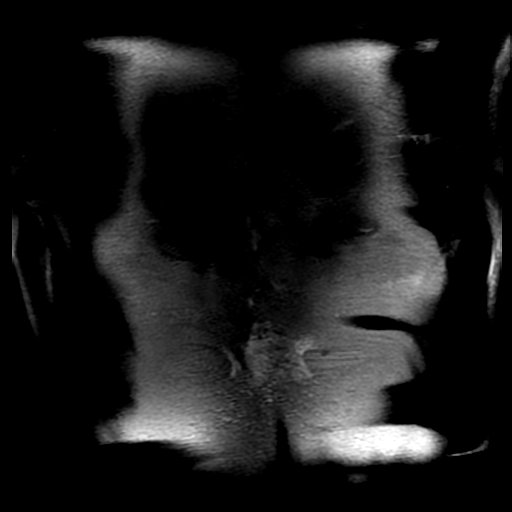
[im 12/45]
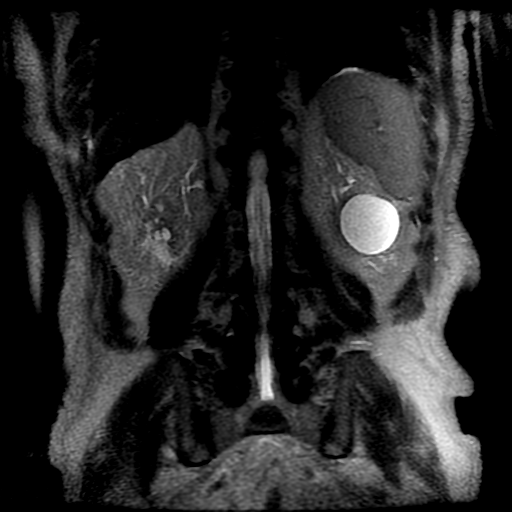
[im 23/45]
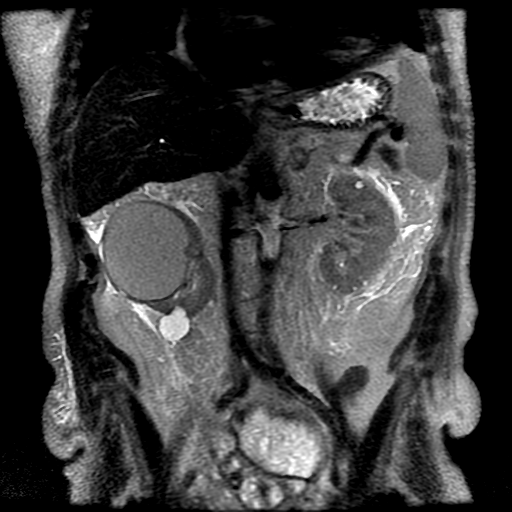
[im 34/45]
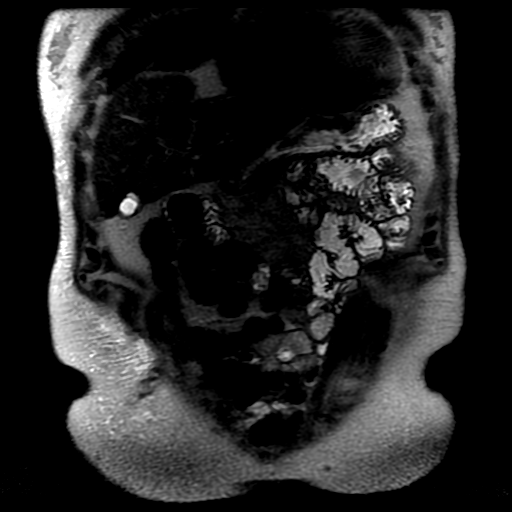
[im 45/45]
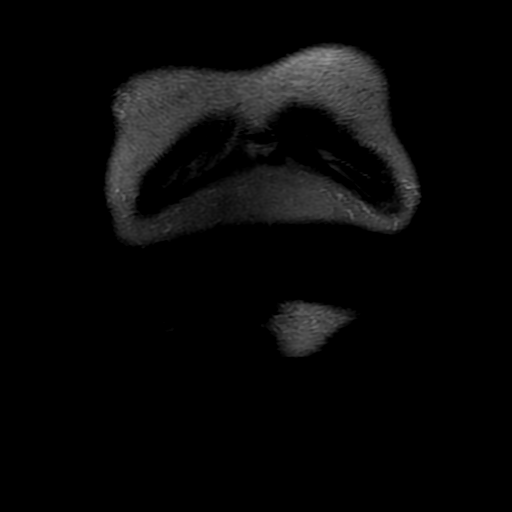

[21 of 48 positions shown; findings below may reference images not displayed]

FINDINGS: Lower chest:  Unremarkable.

Hepatobiliary: No discrete focal abnormality identified in the liver
on this study performed without intravenous contrast material. There
is no evidence for gallstones, gallbladder wall thickening, or
pericholecystic fluid. No intrahepatic or extrahepatic biliary
dilation.

Pancreas: No focal mass lesion. No dilatation of the main duct. No
intraparenchymal cyst. No peripancreatic edema.

Spleen: No splenomegaly. No focal mass lesion.

Adrenals/Urinary Tract: 2 cm left adrenal nodule shows mild loss of
signal intensity on out of phase T1 weighted imaging, suggesting
adrenal adenoma. No right adrenal nodule or mass.

Dominant lesion in the right kidney is a 6.7 x 7.5 cm thin-walled
cyst with areas of mural nodularity. Comparing back to 02/11/2008,
this lesion measured 3.3 cm and appear to have enhancing mural
nodules at that time. Lesion cannot be definitively characterize
given the lack of intravenous contrast. Multiple other scattered
much smaller homogeneous T1 hypo intense, T2 hyperintense lesions
are seen in the right kidney. The next largest simple appearing
cystic lesion is in the extreme lower pole and measures 2.7 cm.

Multiple lesions of varying signal intensity are noted in the left
kidney. The largest lesion is a homogeneous well-defined T1 hypo
intense, T2 hyperintense posterior interpolar lesion measuring
cm. Immediately anterior to this lesion is a 2.3 cm lesion
demonstrating decreased signal intensity on T2 weighted imaging and
T1 shortening on noncontrast gradient echo imaging. As such, this
lesion probably contains internal proteinaceous debris or
hemorrhage. Other smaller left renal cyst appears simple. A 7 mm
lesion extreme lower pole left kidney is probably also proteinaceous
or hemorrhagic.

Stomach/Bowel: Stomach is nondistended. No gastric wall thickening.
No evidence of outlet obstruction. Duodenum is normally positioned
as is the ligament of Treitz. No small bowel or colonic dilatation
within the visualized abdomen. A right abdominal stoma is evident.

Vascular/Lymphatic: No abdominal aortic aneurysm. There is no
gastrohepatic or hepatoduodenal ligament lymphadenopathy. No
intraperitoneal or retroperitoneal lymphadenopathy.

Other: No intraperitoneal free fluid.

Musculoskeletal: No abnormal marrow signal within the visualized
bony anatomy.
IMPRESSION: 1. Multiple renal lesions of varying size and signal intensity. The
most concerning of these lesions is a 7.5 cm interpolar right renal
lesion that appears to have some mural nodularity, also seen on a CT
scan from 02/11/2008 when the lesion was much smaller. Given the
increase in size over the relatively long imaging history in the
presence of the mural nodules, cystic renal cell carcinoma must be
considered.
2. Other simple appearing cysts identified in the right kidney.
3. Multiple cysts in the left kidney, measuring up to 4.8 cm. Some
of these have increased signal intensity on T1 weighted imaging, but
are homogeneous and likely represent hemorrhagic or proteinaceous
lesions and smaller lesions are likely Bosniak II cysts. The 4.8 cm
lesion is above the size threshold for characterization as Bosniak
II, especially in the absence of a postcontrast assessment. As such,
this lesion is characterize as Bosniak IIF.

## 2018-01-18 ENCOUNTER — Other Ambulatory Visit: Payer: Self-pay

## 2018-01-18 MED ORDER — AMIODARONE HCL 200 MG PO TABS: 200.0000 mg | ORAL_TABLET | Freq: Every day | ORAL | 3 refills | Status: AC

## 2018-03-15 DEATH — deceased

## 2018-04-27 IMAGING — NM NM CHEST EXAM
4 series · 24 of 24 positions shown · non-contrast
Comparison: none

[Series 1: wbr_r-proj_st rest · 6.40mm/px · 6 of 64 frames shown]
[frame 6/64]
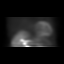
[frame 16/64]
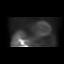
[frame 27/64]
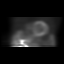
[frame 38/64]
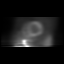
[frame 48/64]
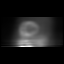
[frame 59/64]
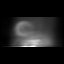

[Series 1: wbr_s-proj_st stress · 6.40mm/px · 6 of 64 frames shown]
[frame 6/64]
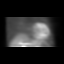
[frame 16/64]
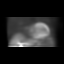
[frame 27/64]
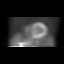
[frame 38/64]
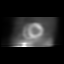
[frame 48/64]
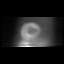
[frame 59/64]
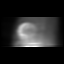

[Series 1: stress · 6.40mm/px · 6 of 64 frames shown]
[frame 6/64]
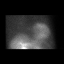
[frame 16/64]
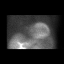
[frame 27/64]
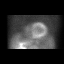
[frame 38/64]
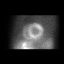
[frame 48/64]
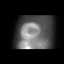
[frame 59/64]
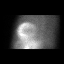

[Series 1: rest · 6.40mm/px · 6 of 64 frames shown]
[frame 6/64]
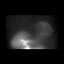
[frame 16/64]
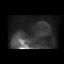
[frame 27/64]
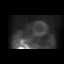
[frame 38/64]
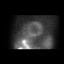
[frame 48/64]
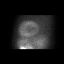
[frame 59/64]
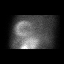

[24 of 24 positions shown; findings below may reference images not displayed]

Canned report from images found in remote index.

Refer to host system for actual result text.

## 2018-04-28 IMAGING — DX DG CHEST 1V PORT
1 series · 1 of 1 positions shown · non-contrast
Comparison: Portable chest x-ray of 06/13/2016

CLINICAL DATA: Hypotension, history of scrotal lesion

EXAM:
PORTABLE CHEST 1 VIEW

[chest ap]
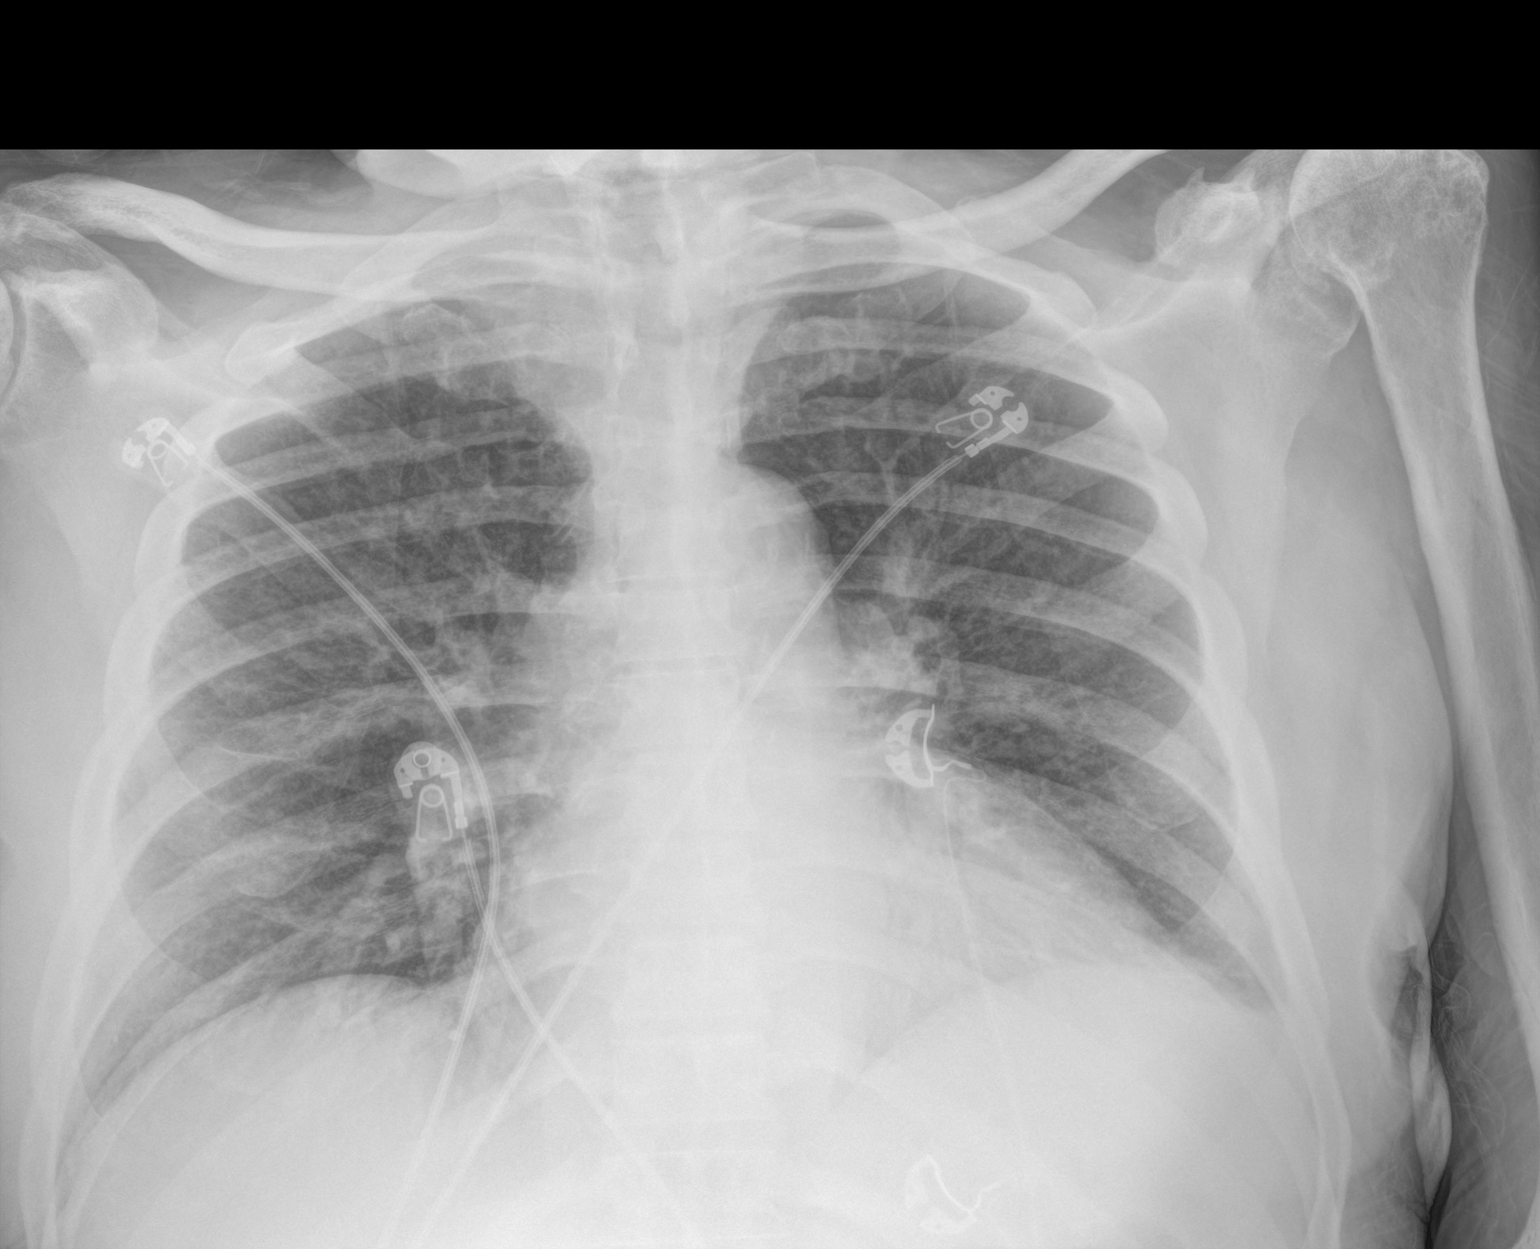

[1 of 1 positions shown; findings below may reference images not displayed]

FINDINGS: No pneumonia or effusion is seen. Moderate cardiomegaly remains.
There may be very minimal pulmonary vascular congestion present. A
tiny left pleural effusion cannot be excluded. There are
degenerative changes in both shoulders.
IMPRESSION: Stable cardiomegaly. Questionable minimal pulmonary vascular
congestion. Also, cannot exclude a tiny left pleural effusion.

## 2018-05-18 IMAGING — US US ART/VEN ABD/PELV/SCROTUM DOPPLER LTD
1 series · 13 of 25 positions shown · non-contrast
Comparison: None.

CT 06/07/2016

CLINICAL DATA: Encysted hydrocele.

EXAM:
SCROTAL ULTRASOUND
DOPPLER ULTRASOUND OF THE TESTICLES
TECHNIQUE: Complete ultrasound examination of the testicles, epididymis, and
other scrotal structures was performed. Color and spectral Doppler
ultrasound were also utilized to evaluate blood flow to the
testicles.

[Series 1: us art/ven abd/pelv/scrotum doppler ltd · 0.11mm/px · 13 of 80 slices shown]
[im 1/80]
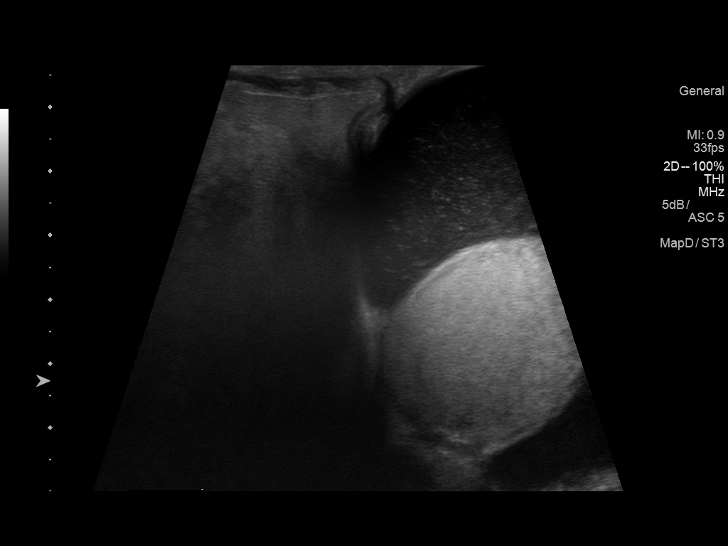
[im 7/80]
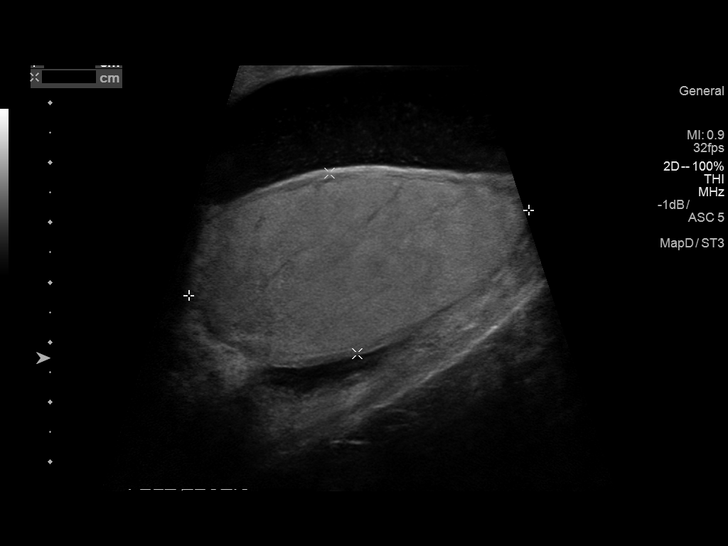
[im 14/80]
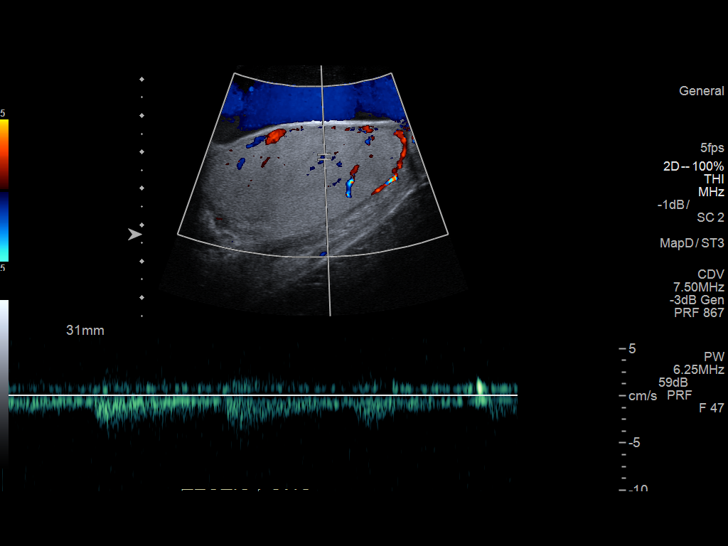
[im 20/80]
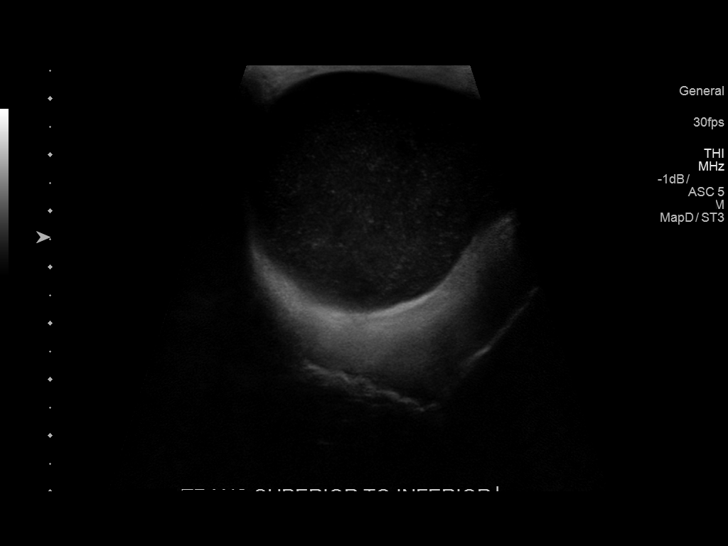
[im 27/80]
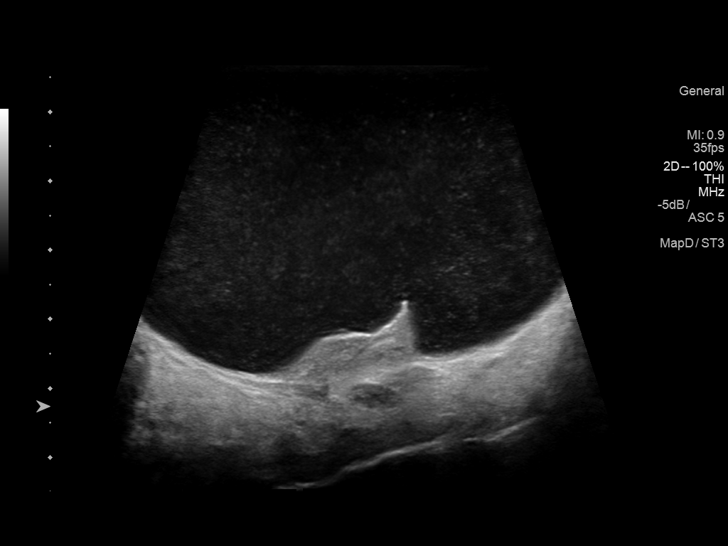
[im 33/80]
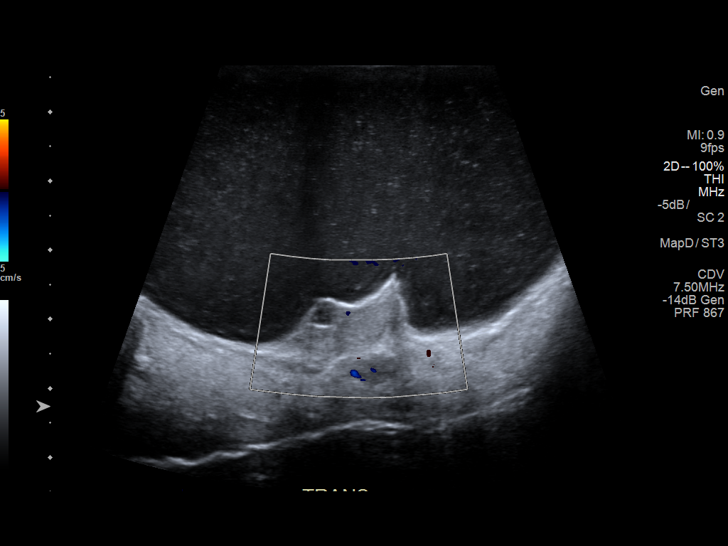
[im 40/80]
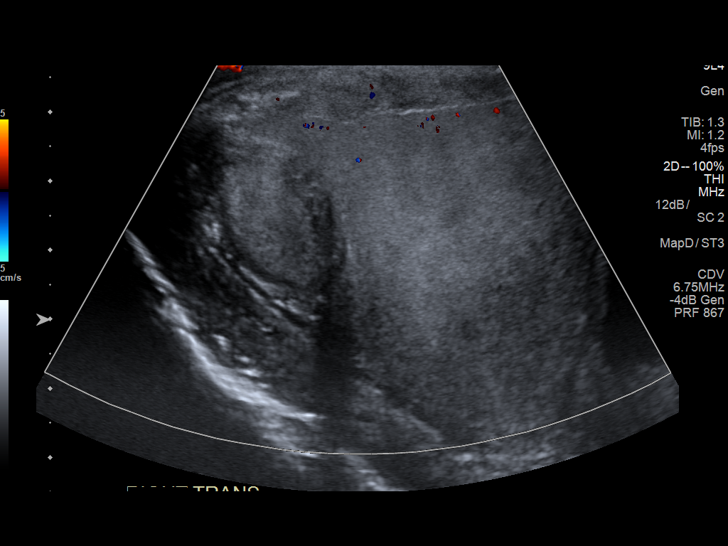
[im 47/80]
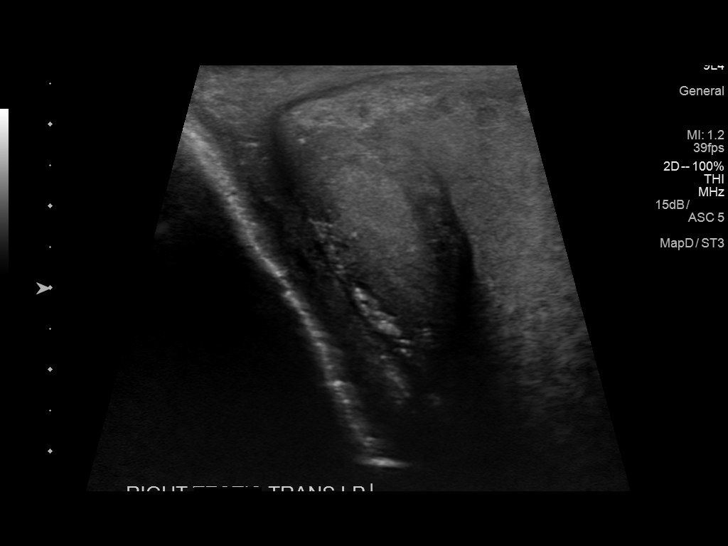
[im 53/80]
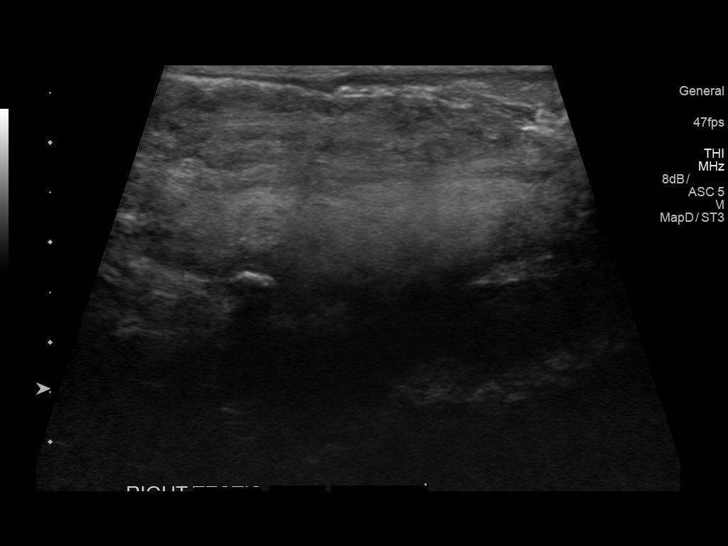
[im 60/80]
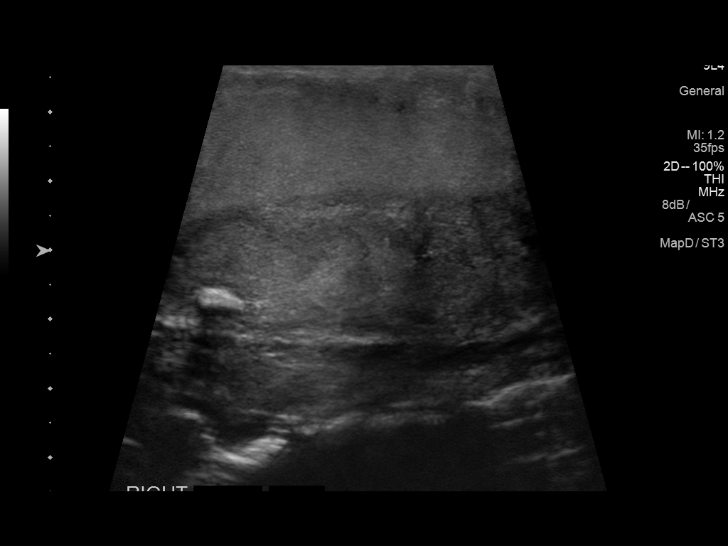
[im 66/80]
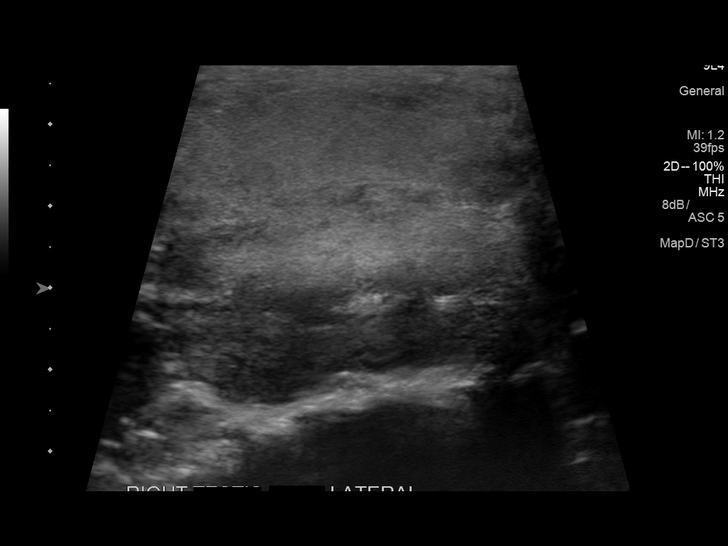
[im 73/80]
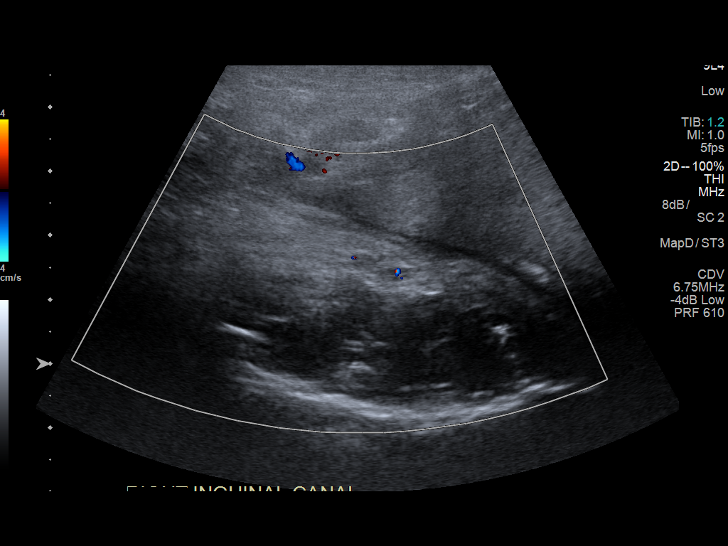
[im 80/80]
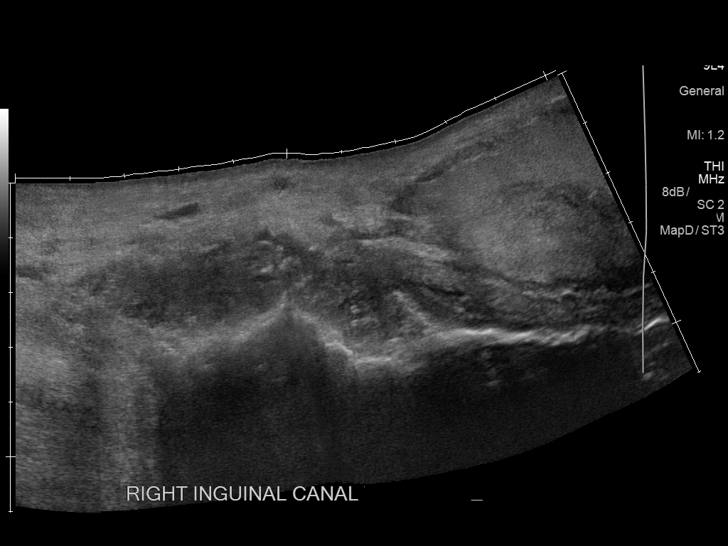

[13 of 25 positions shown; findings below may reference images not displayed]

FINDINGS: Right testicle

Measurements: 4.6 x 1.5 x 1.6 cm.. The testis is small and irregular
in shape. There is no blood flow on color or Doppler evaluation. 7
mm calcification is noted superiorly. Echotexture is heterogeneous
and increased.

Left testicle

Measurements: 5.8 x 3.1 x 3.2 cm. No mass or microlithiasis
visualized. There is normal blood flow.

Right epididymis:  Not well visualized.

Left epididymis: 4 mm epididymal head cyst. Otherwise normal in
size.

Hydrocele: Heterogeneous echogenic fluid within the right scrotum,
moderate to large in volume. Large volume complex fluid in the left
scrotum.

Varicocele:  None visualized.

Within the right inguinal canal there is herniation of fat with
Valsalva.

Pulsed Doppler interrogation of both testes demonstrates normal low
resistance arterial and venous waveforms to the left testis. No flow
is seen to the right testis.
IMPRESSION: 1. Small irregular right testis with absent blood flow and
parenchymal calcification. There is heterogeneous increased
testicular echotexture. This is consistent with sequela of prior
testicular torsion. A discrete testicular mass is not seen, however
background heterogeneity limits evaluation. The testis appeared
small in size on CT 3 weeks prior.
2. Heterogeneous large bilateral hydroceles. This appears larger on
the left, however echogenic and more complex on the right.
3. Possible right inguinal hernia with fat.
These results will be called to the ordering clinician or
representative by the Radiologist Assistant, and communication
documented in the PACS or zVision Dashboard.

## 2018-10-28 IMAGING — MR MR PELVIS WO/W CM
10 of 26 series · 19 of 48 positions shown · IV contrast (multihance)
Comparison: MRI abdomen dated 06/30/2016. CT abdomen/pelvis dated
06/07/2016.

CLINICAL DATA: Follow-up renal lesions. Evaluate inguinal lymph
nodes, genital warts, history of squamous cell skin cancer.

EXAM:
MRI ABDOMEN AND PELVIS WITHOUT AND WITH CONTRAST
TECHNIQUE: Multiplanar multisequence MR imaging of the abdomen and pelvis was
performed both before and after the administration of intravenous
contrast.
CONTRAST:  10mL MULTIHANCE GADOBENATE DIMEGLUMINE 529 MG/ML IV SOLN

[Series 3: T2 · axial · 5.0mm · 0.47mm/px · z∈[-219,+171]mm · 2 of 66 slices shown (1 of 3)]
[im 1/66]
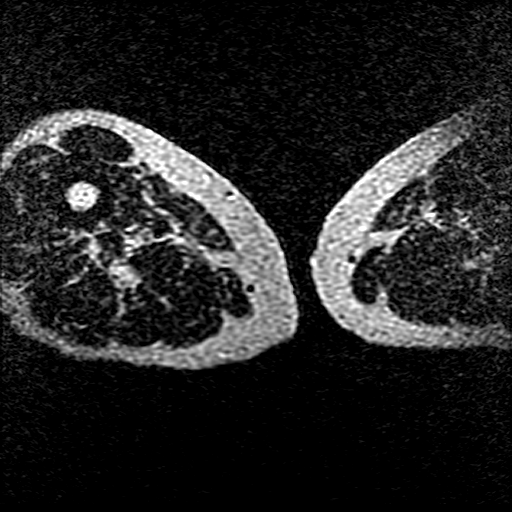
[im 66/66]
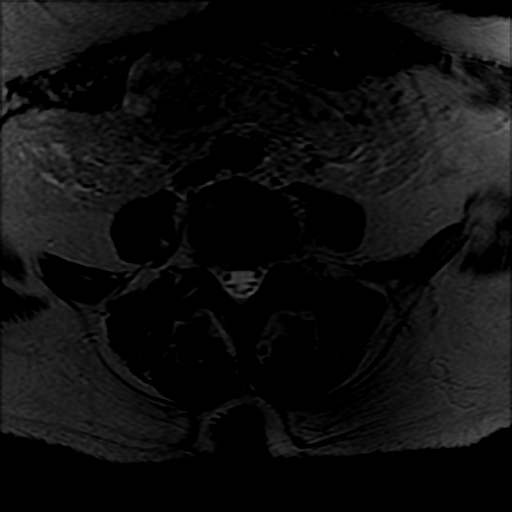

[Series 4: T2 fat-sat · axial · 5.0mm · 0.47mm/px · z∈[-219,+171]mm · 2 of 66 slices shown (1 of 2)]
[im 1/66]
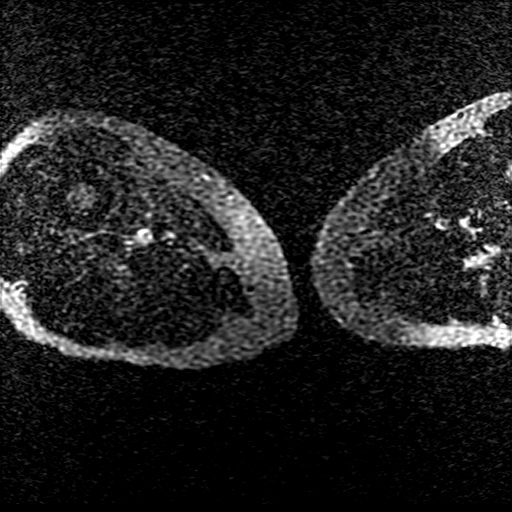
[im 66/66]
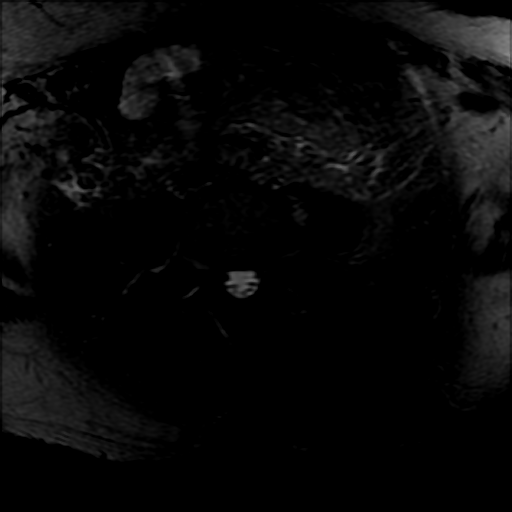

[Series 5: T2 · sagittal · 5.0mm · 0.57mm/px · 1 of 30 slices shown (2 of 3)]
[im 1/30]
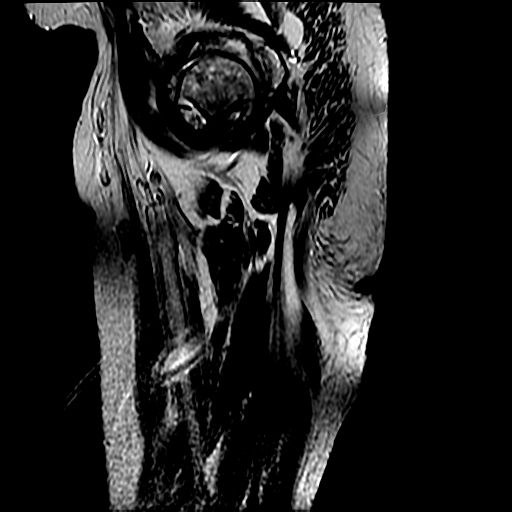

[Series 6: T1 · axial · 5.0mm · 0.47mm/px · z∈[-219,+171]mm · 2 of 66 slices shown]
[im 1/66]
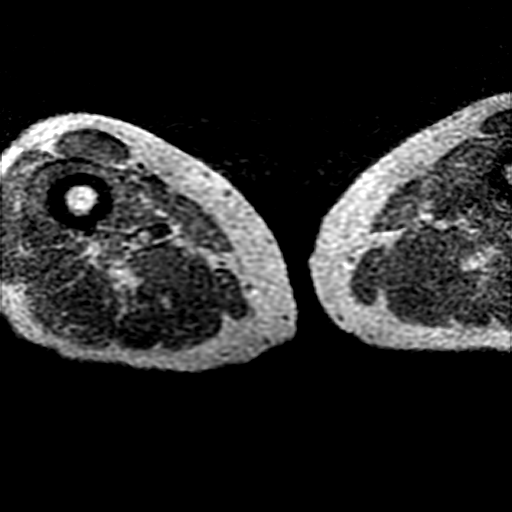
[im 66/66]
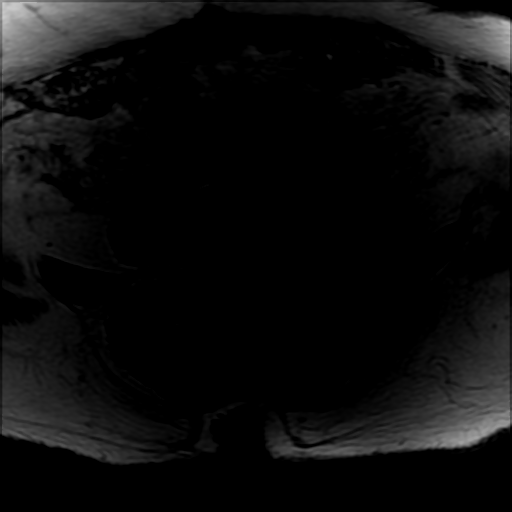

[Series 7: T1 fat-sat · axial · 5.0mm · 0.47mm/px · z∈[-219,+171]mm · 2 of 66 slices shown]
[im 1/66]
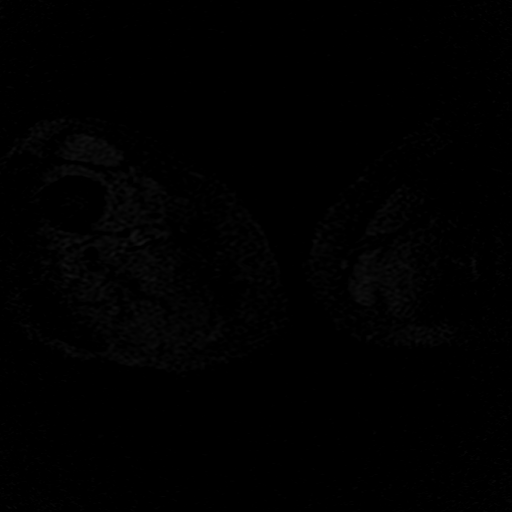
[im 66/66]
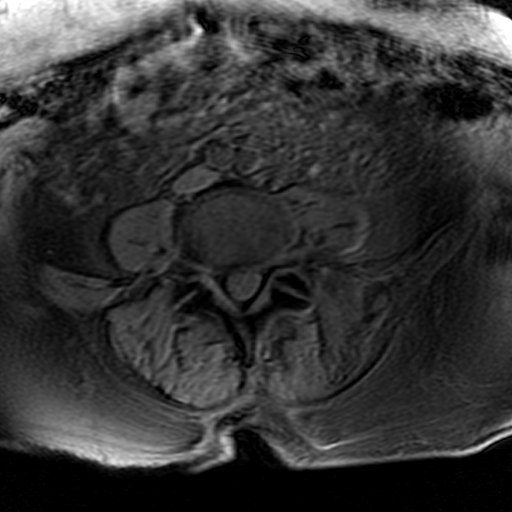

[Series 10: DWI b500 · axial · 6.0mm · 1.48mm/px · z∈[+107,+403]mm · 2 of 78 slices shown]
[im 1/78]
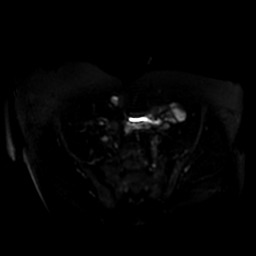
[im 78/78]
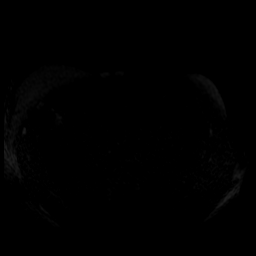

[Series 11: T2 fat-sat · axial · 5.0mm · 0.78mm/px · z∈[+120,+420]mm · 2 of 61 slices shown (2 of 2)]
[im 1/61]
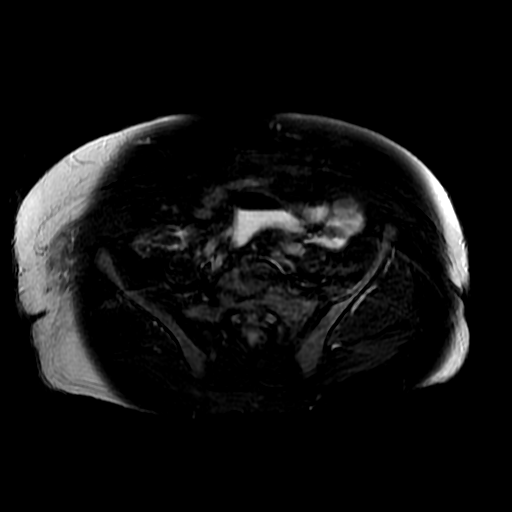
[im 61/61]
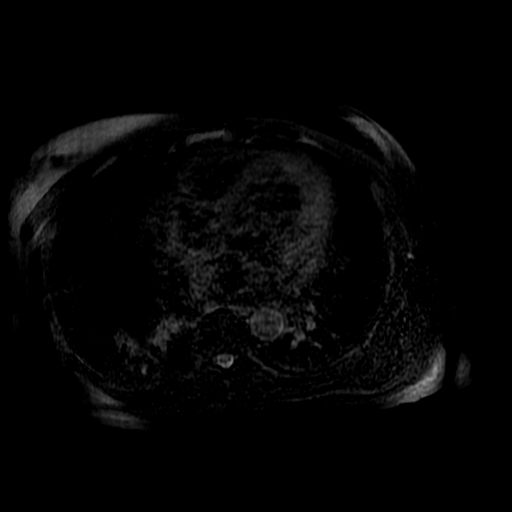

[Series 12: ax dualecho · axial · 5.0mm · 0.78mm/px · 1 of 32 slices shown (1 of 2)]
[im 1/32]
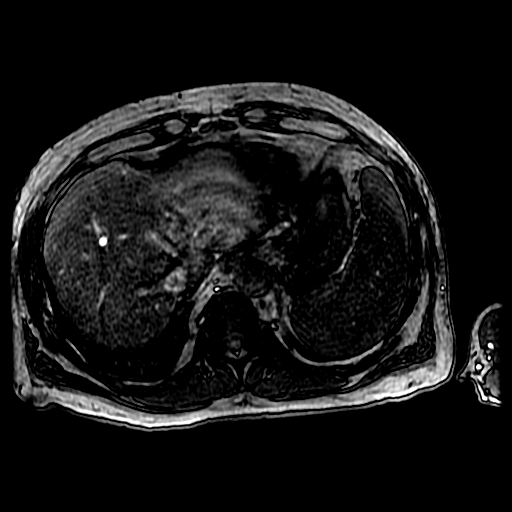

[Series 13: ax dualecho · axial · 5.0mm · 0.78mm/px · z∈[+120,+420]mm · 3 of 122 slices shown (2 of 2)]
[im 1/122]
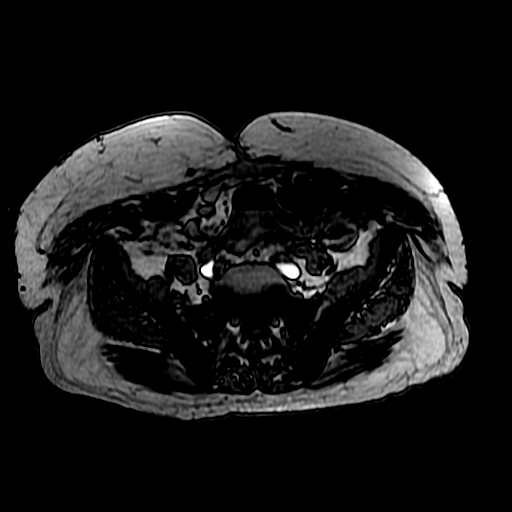
[im 61/122]
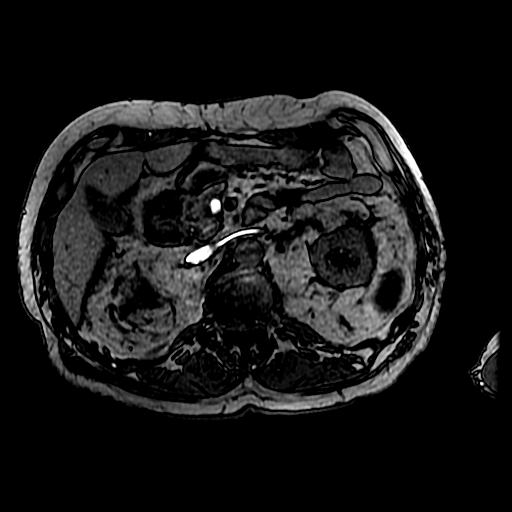
[im 122/122]
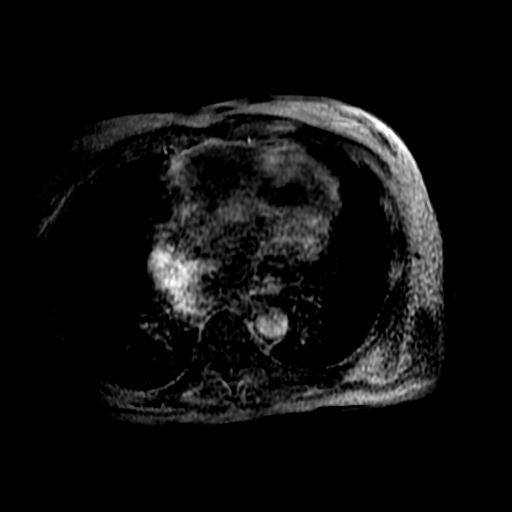

[Series 14: T2 · axial · 5.0mm · 0.78mm/px · z∈[+120,+420]mm · 2 of 61 slices shown (3 of 3)]
[im 1/61]
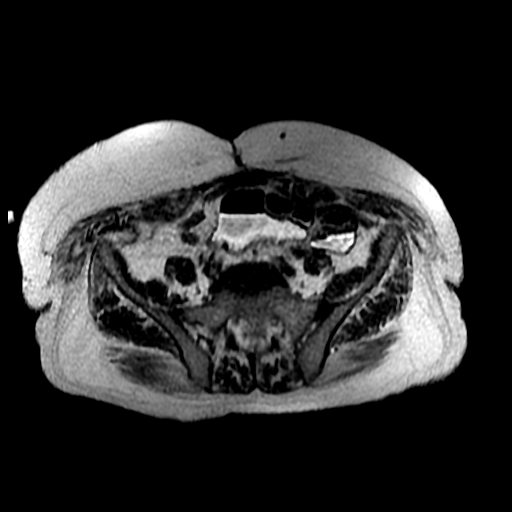
[im 61/61]
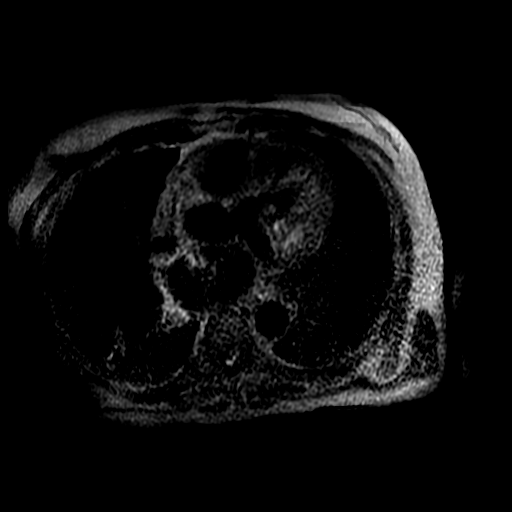

[19 of 48 positions shown; findings below may reference images not displayed]

FINDINGS: COMBINED FINDINGS FOR BOTH MR ABDOMEN AND PELVIS

Lower chest: Lung bases are clear.

Hepatobiliary: Liver is within normal limits.

Gallbladder is unremarkable. No intrahepatic extrahepatic ductal
dilatation.

Pancreas:  Within normal limits.

Spleen:  Within normal limits.

Adrenals/Urinary Tract: 1.8 cm left renal nodule the (series
6808/image 20), with mild intracellular lipid, compatible with a
benign adrenal adenoma.

Right adrenal gland is within normal limits.

Bilateral renal cysts of varying sizes and complexities. Multiple
simple cysts, including a dominant 4.9 x 4.7 cm simple cyst in the
posterior left lower kidney (series 14/ image 38), benign (Bosniak
I).

2.4 x 2.2 cm hemorrhagic lesion along the posterior left lower
kidney (series 2227/ image 49), with possible subtle enhancement on
postcontrast subtraction imaging (series 66849/image 48), equivocal.
This is considered a Bosniak IIF lesion.

3.9 x 4.3 cm thick-walled cystic lesion with anterior mural
nodularity in the anterior right upper kidney (series 2227/ image
55). Thickened enhancing rim but no definite enhancement of the
mural nodule, which is hyperdense/ hemorrhagic (series setting 100/
image 35). This has decreased from recent MR but is also considered
a Bosniak IIF lesion.

No hydronephrosis.

Stomach/Bowel: Stomach is within normal limits.

Right mid abdominal ostomy. Visualized bowel is otherwise
unremarkable.

Vascular/Lymphatic:  No evidence of abdominal aortic aneurysm.

No suspicious abdominopelvic lymphadenopathy.

Small bilateral inguinal lymph nodes measuring up to 9 mm short
axis, within normal limits.

Reproductive: Mildly heterogeneous appearance of the prostate.

Large bilateral hydroceles.

Other:  No abdominopelvic ascites.

Musculoskeletal: No focal osseous lesions.

Degenerative changes of the bilateral hips. Small right bursal fluid
collection (series 3/ image 33).
IMPRESSION: 4.3 cm thick-walled cyst with anterior mural nodule in the anterior
right upper kidney, decreased, Bosniak IIF.

2.4 cm hemorrhagic lesion along the posterior left lower kidney,
with possible subtle enhancement on postcontrast subtraction
imaging, equivocal. This is also consider a Bosniak IIF.

Follow-up of both lesions is suggested in 6-12 months.

No suspicious abdominopelvic lymphadenopathy. Small bilateral pelvic
lymph nodes measuring up to 9 mm short axis, within normal limits.

Large bilateral scrotal hydroceles.

Additional ancillary findings as above.

## 2019-05-03 IMAGING — CT CT ABD-PELV W/O CM
2 of 4 series · 15 of 46 positions shown, 17 images · non-contrast
Comparison: MRI of the abdomen and pelvis August 16, 2017 and CT
abdomen and pelvis June 07, 2016

ADDENDUM:
Dense gallbladder most compatible with gallbladder sludge though,
could reflect vicarious excretion contrast. No CT findings of acute
cholecystitis.
CLINICAL DATA: Intermittent shortness of breath for week. LEFT
upper quadrant pain. Fell 1 week ago. History of colostomy,
peritonitis, diverticulitis, chronic kidney disease.

EXAM:
CT ABDOMEN AND PELVIS WITHOUT CONTRAST
TECHNIQUE: Multidetector CT imaging of the abdomen and pelvis was performed
following the standard protocol without IV contrast.

[Series 2: axial st · axial · 0.71mm/px · z∈[-316,+99]mm · 12 of 95 slices shown, 14 images]
[im 6/95  soft-tissue]
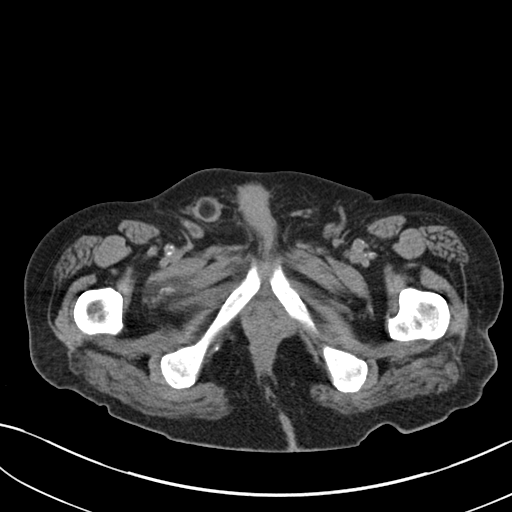
[im 6/95  bone]
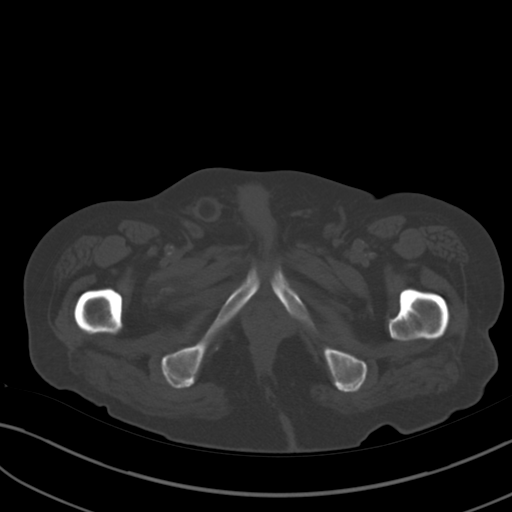
[im 16/95  soft-tissue]
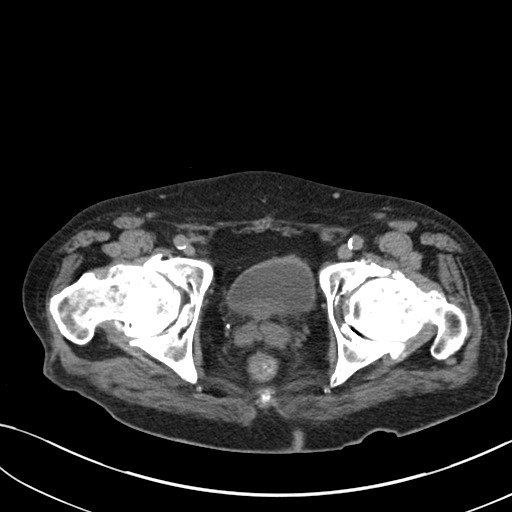
[im 21/95  soft-tissue]
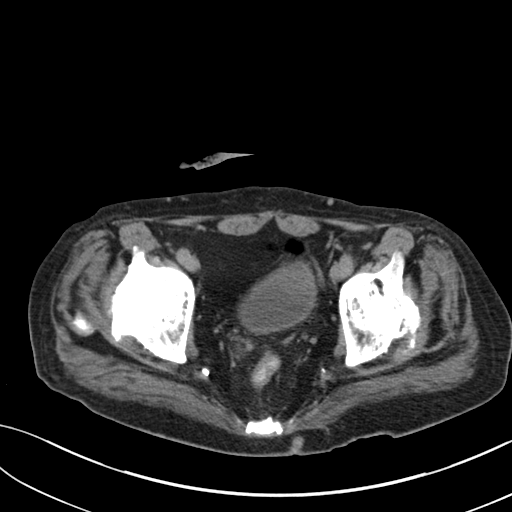
[im 27/95  soft-tissue]
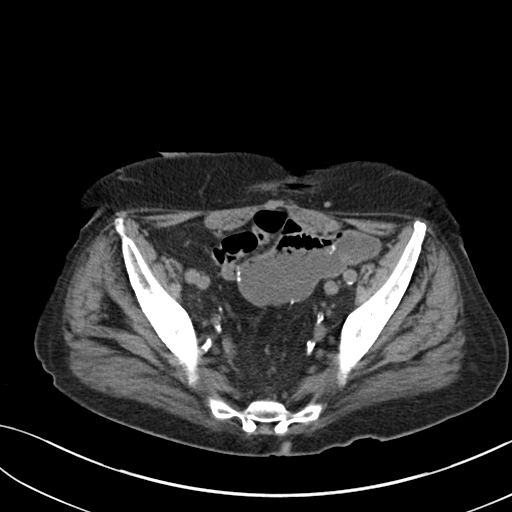
[im 37/95  soft-tissue]
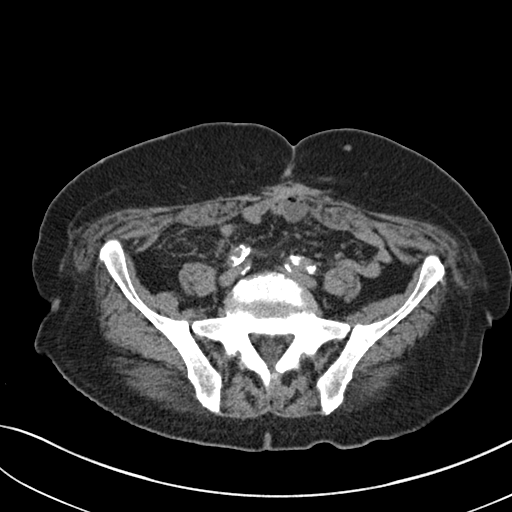
[im 42/95  soft-tissue]
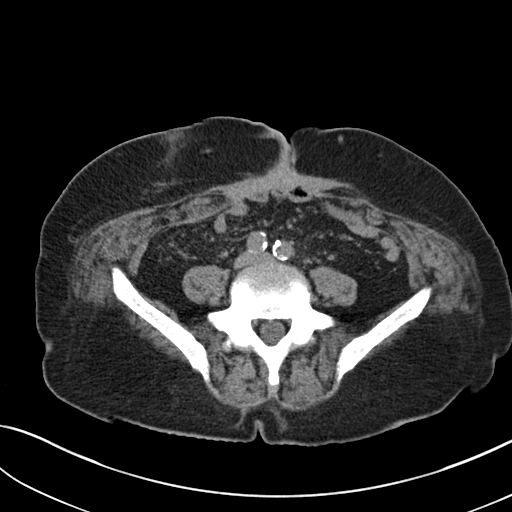
[im 53/95  soft-tissue]
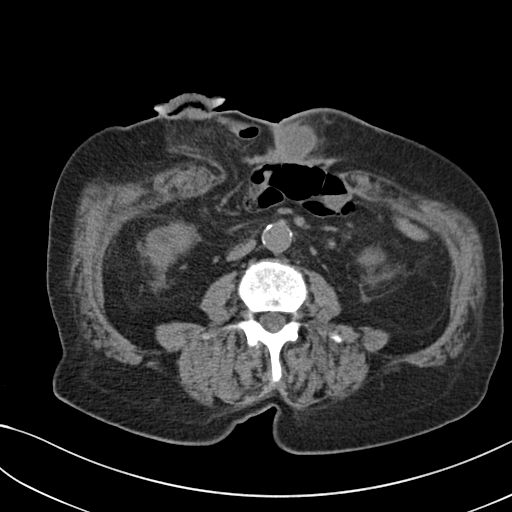
[im 58/95  soft-tissue]
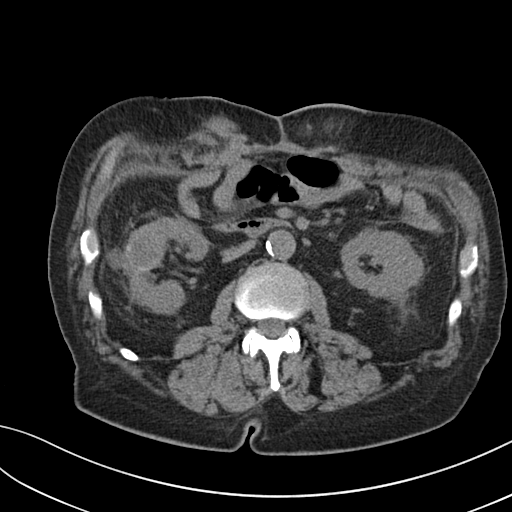
[im 68/95  soft-tissue]
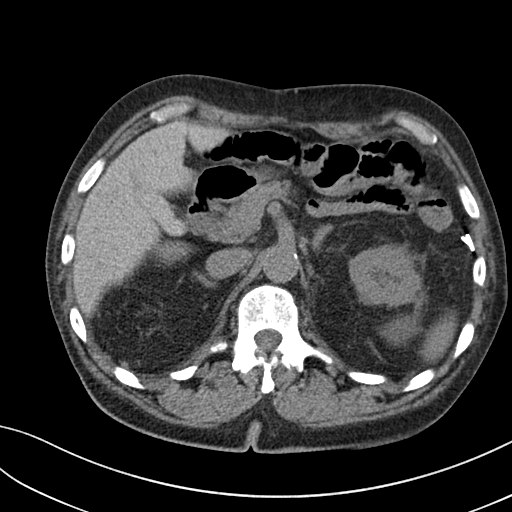
[im 68/95  bone]
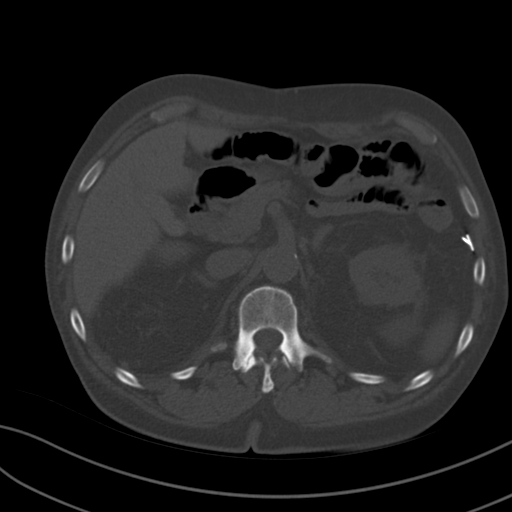
[im 74/95  soft-tissue]
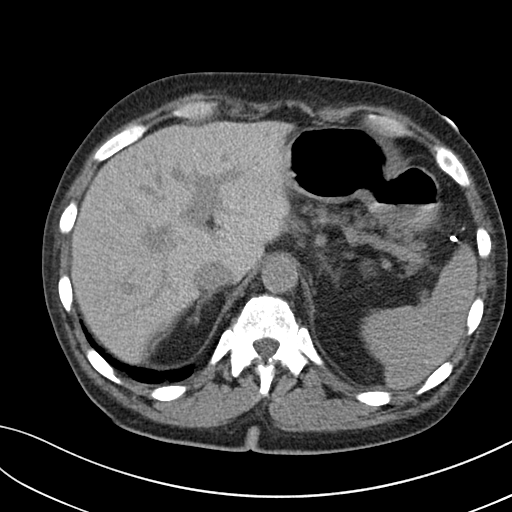
[im 79/95  soft-tissue]
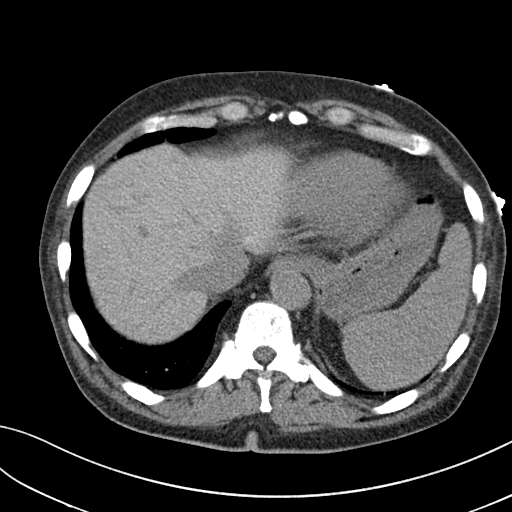
[im 89/95  soft-tissue]
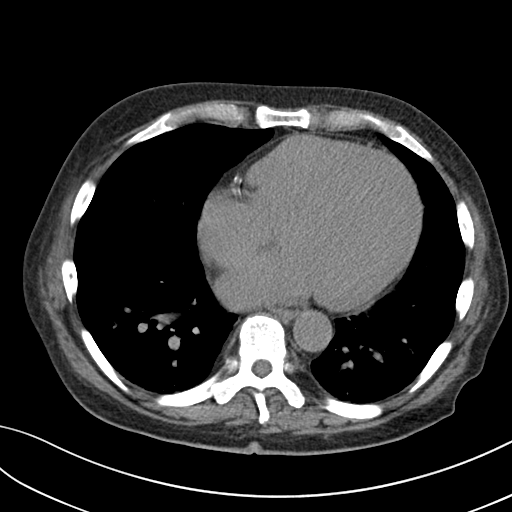

[Series 5: coronal st · coronal · 0.76mm/px · 3 of 95 slices shown]
[im 32/95  soft-tissue]
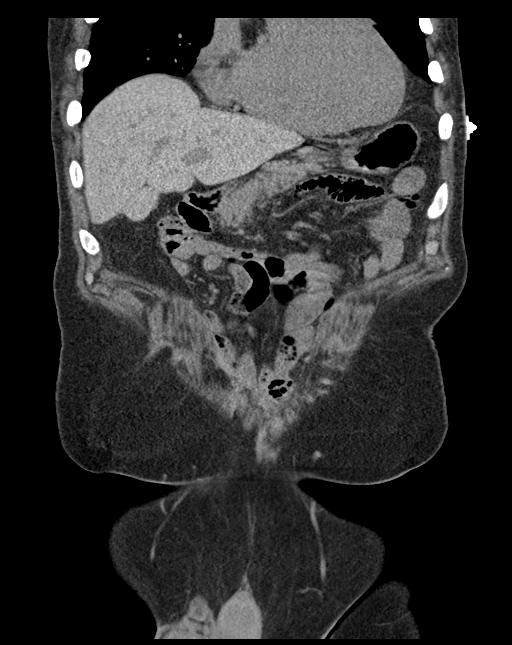
[im 42/95  soft-tissue]
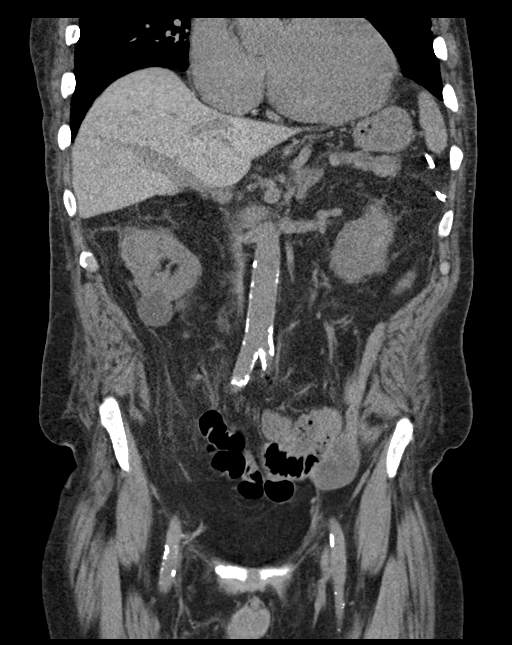
[im 53/95  soft-tissue]
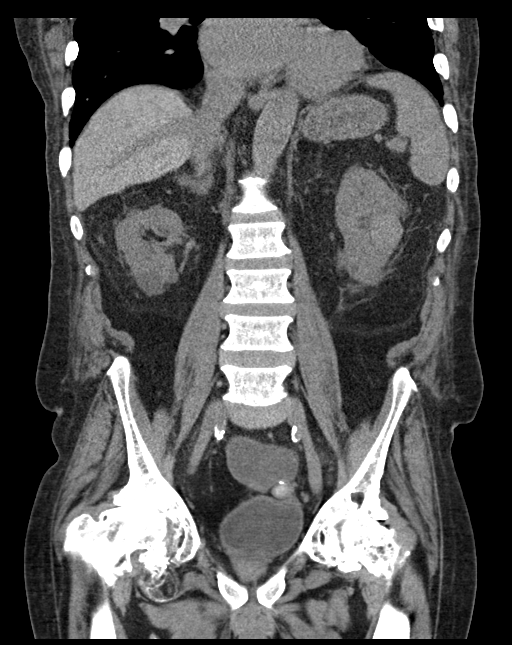

[15 of 46 positions shown; findings below may reference images not displayed]

FINDINGS: LOWER CHEST: Dependent atelectasis. Punctate calcified granuloma.
The heart is moderately enlarged. Mild coronary artery
calcification. No pericardial effusion.

HEPATOBILIARY: Dense gallbladder.  Negative noncontrast CT liver.

PANCREAS: Normal.

SPLEEN: Normal.

ADRENALS/URINARY TRACT: Kidneys are orthotopic, demonstrating normal
size and morphology. No nephrolithiasis, hydronephrosis; limited
assessment for renal masses on this nonenhanced examination.
Multiple complex renal masses. 5.1 cm LEFT interpolar cyst. The
unopacified ureters are normal in course and caliber. Urinary
bladder is partially distended and unremarkable. 2.3 cm LEFT adrenal
mass previously characterized as benign adenoma.

STOMACH/BOWEL: Status post colectomy. RIGHT lower quadrant ostomy
with multiple loops of small bowel. Mild wide necked parastomal
hernia containing small bowel. Similar patulous surgical bowel
anastomosis. Bowel is overall normal in course and caliber.

VASCULAR/LYMPHATIC: Aortoiliac vessels are normal in course and
caliber. Severe calcific atherosclerosis. No lymphadenopathy by CT
size criteria.

REPRODUCTIVE: Partially imaged RIGHT scrotal mass, possible testicle
within the inguinal canal.

OTHER: No intraperitoneal free fluid or free air.

MUSCULOSKELETAL: New LEFT paraumbilical 2.5 x 2.7 cm fluid
collection. Anterior abdominal wall scarring. Severe degenerative
change of bilateral hips. Multilevel advanced facet arthropathy.
IMPRESSION: 1. New 2.5 x 2.7 cm paraumbilical anterior abdominal wall fluid
collection, given history of trauma, this could reflect hematoma or
seroma.
2. Partially imaged complex RIGHT scrotal mass, incompletely
evaluated. Recommend correlation with clinical examination.
3. Bilateral complex renal cysts as characterized on prior MRI.

Aortic Atherosclerosis ([id number]).

## 2019-05-04 IMAGING — DX DG CHEST 1V PORT
1 series · 1 of 1 positions shown · non-contrast
Comparison: 11/16/2016

CLINICAL DATA: Dyspnea times 48 hours

EXAM:
PORTABLE CHEST 1 VIEW

[chest ap]
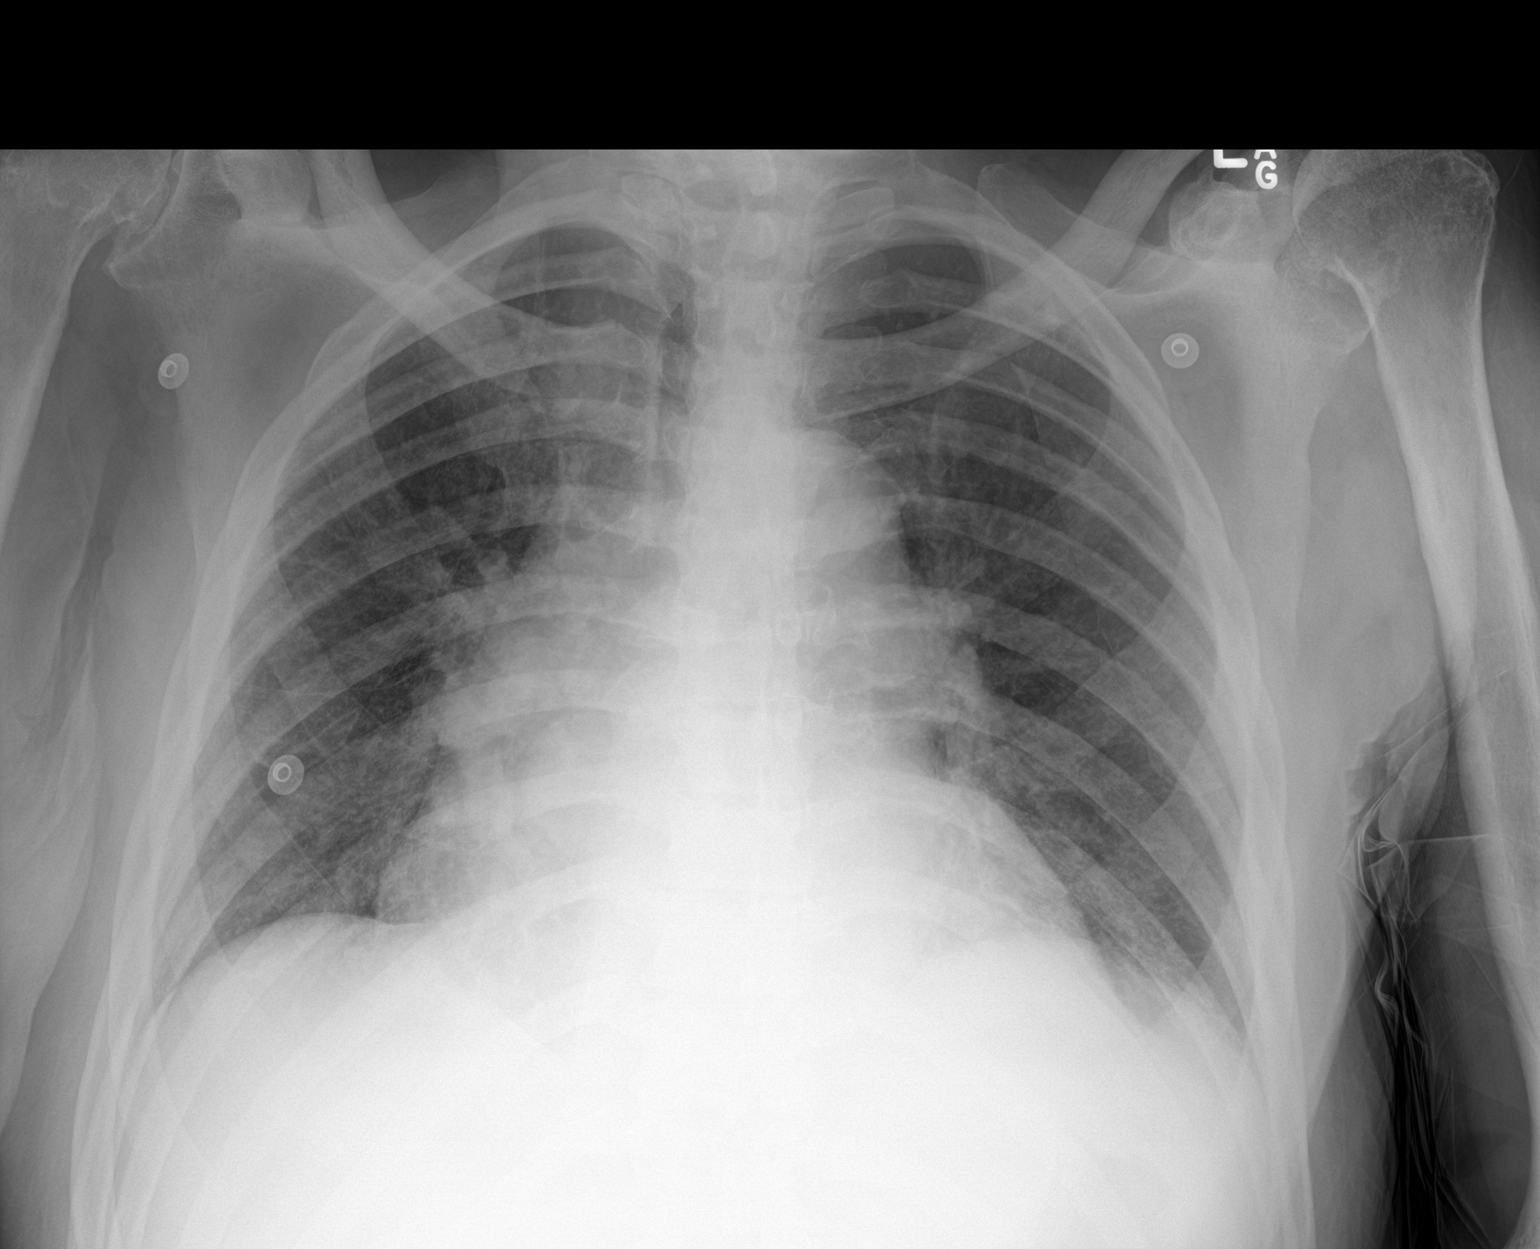

[1 of 1 positions shown; findings below may reference images not displayed]

FINDINGS: Stable cardiomegaly with aortic atherosclerosis. Minimal
interstitial edema and left basilar atelectasis. No effusion or
pneumothorax. Osteoarthritis of the AC and glenohumeral joints with
high-riding humeral heads compatible with chronic rotator cuff
tears.
IMPRESSION: Cardiomegaly with mild vascular congestion and interstitial edema.

## 2020-01-17 ENCOUNTER — Encounter: Payer: Self-pay | Admitting: General Practice
